# Patient Record
Sex: Male | Born: 1960
Health system: Southern US, Academic
[De-identification: ages and names within clinical notes are randomized; demographics above are authoritative.]

## PROBLEM LIST (undated history)

## (undated) ENCOUNTER — Encounter

## (undated) ENCOUNTER — Telehealth

## (undated) DIAGNOSIS — Z87442 Personal history of urinary calculi: Secondary | ICD-10-CM

## (undated) DIAGNOSIS — J8410 Pulmonary fibrosis, unspecified: Secondary | ICD-10-CM

## (undated) DIAGNOSIS — J189 Pneumonia, unspecified organism: Secondary | ICD-10-CM

## (undated) DIAGNOSIS — J449 Chronic obstructive pulmonary disease, unspecified: Secondary | ICD-10-CM

## (undated) DIAGNOSIS — R0600 Dyspnea, unspecified: Secondary | ICD-10-CM

## (undated) DIAGNOSIS — F419 Anxiety disorder, unspecified: Secondary | ICD-10-CM

## (undated) DIAGNOSIS — E119 Type 2 diabetes mellitus without complications: Secondary | ICD-10-CM

## (undated) DIAGNOSIS — J45909 Unspecified asthma, uncomplicated: Secondary | ICD-10-CM

## (undated) HISTORY — PX: LITHOTRIPSY: SUR834

## (undated) HISTORY — PX: MULTIPLE TOOTH EXTRACTIONS: SHX2053

## (undated) HISTORY — PX: TONSILLECTOMY: SUR1361

## (undated) HISTORY — PX: COLONOSCOPY W/ POLYPECTOMY: SHX1380

---

## 2011-01-16 ENCOUNTER — Ambulatory Visit (HOSPITAL_COMMUNITY)
Admission: RE | Admit: 2011-01-16 | Discharge: 2011-01-16 | Disposition: A | Discharge status: Home / Self Care | Payer: BC Managed Care – PPO | Source: Ambulatory Visit | Attending: Urology | Admitting: Urology

## 2011-01-16 DIAGNOSIS — F17200 Nicotine dependence, unspecified, uncomplicated: Secondary | ICD-10-CM | POA: Insufficient documentation

## 2011-01-16 DIAGNOSIS — J45909 Unspecified asthma, uncomplicated: Secondary | ICD-10-CM | POA: Insufficient documentation

## 2011-01-16 DIAGNOSIS — Z01818 Encounter for other preprocedural examination: Secondary | ICD-10-CM | POA: Insufficient documentation

## 2011-01-16 DIAGNOSIS — Z01812 Encounter for preprocedural laboratory examination: Secondary | ICD-10-CM | POA: Insufficient documentation

## 2011-01-16 DIAGNOSIS — Z01810 Encounter for preprocedural cardiovascular examination: Secondary | ICD-10-CM | POA: Insufficient documentation

## 2011-01-16 DIAGNOSIS — N200 Calculus of kidney: Secondary | ICD-10-CM | POA: Insufficient documentation

## 2011-01-16 LAB — CBC
MCV: 90.9 fL (ref 78.0–100.0)
Platelets: 215 10*3/uL (ref 150–400)
RBC: 5.18 MIL/uL (ref 4.22–5.81)
WBC: 7.7 10*3/uL (ref 4.0–10.5)

## 2011-01-16 LAB — BASIC METABOLIC PANEL
Chloride: 106 mEq/L (ref 96–112)
GFR calc Af Amer: >60 mL/min (ref >60)
GFR calc non Af Amer: >60 mL/min (ref >60)
Potassium: 4.3 mEq/L (ref 3.5–5.1)
Sodium: 138 mEq/L (ref 135–145)

## 2012-03-28 ENCOUNTER — Other Ambulatory Visit (HOSPITAL_COMMUNITY): Payer: Self-pay | Admitting: Internal Medicine

## 2012-04-03 ENCOUNTER — Encounter (HOSPITAL_COMMUNITY): Payer: BC Managed Care – PPO

## 2012-06-24 ENCOUNTER — Encounter (HOSPITAL_COMMUNITY): Payer: BC Managed Care – PPO

## 2012-06-25 ENCOUNTER — Ambulatory Visit (HOSPITAL_COMMUNITY)
Admission: RE | Admit: 2012-06-25 | Discharge: 2012-06-25 | Disposition: A | Discharge status: Home / Self Care | Payer: BC Managed Care – PPO | Source: Ambulatory Visit | Attending: Internal Medicine | Admitting: Internal Medicine

## 2012-06-25 DIAGNOSIS — J45909 Unspecified asthma, uncomplicated: Secondary | ICD-10-CM | POA: Insufficient documentation

## 2012-06-25 MED ORDER — ALBUTEROL SULFATE (5 MG/ML) 0.5% IN NEBU
2.5000 mg | INHALATION_SOLUTION | Freq: Once | RESPIRATORY_TRACT | Status: AC
  Administered 2012-06-25: 2.5 mg via RESPIRATORY_TRACT

## 2014-07-01 ENCOUNTER — Other Ambulatory Visit: Payer: Self-pay | Admitting: Internal Medicine

## 2014-07-01 ENCOUNTER — Ambulatory Visit
Admission: RE | Admit: 2014-07-01 | Discharge: 2014-07-01 | Disposition: A | Discharge status: Home / Self Care | Payer: BC Managed Care – PPO | Source: Ambulatory Visit | Attending: Internal Medicine | Admitting: Internal Medicine

## 2014-07-01 DIAGNOSIS — R05 Cough: Secondary | ICD-10-CM

## 2014-07-01 DIAGNOSIS — R059 Cough, unspecified: Secondary | ICD-10-CM

## 2014-07-01 DIAGNOSIS — R9389 Abnormal findings on diagnostic imaging of other specified body structures: Secondary | ICD-10-CM

## 2014-07-08 ENCOUNTER — Ambulatory Visit
Admission: RE | Admit: 2014-07-08 | Discharge: 2014-07-08 | Disposition: A | Discharge status: Home / Self Care | Payer: BC Managed Care – PPO | Source: Ambulatory Visit | Attending: Internal Medicine | Admitting: Internal Medicine

## 2014-07-08 DIAGNOSIS — R9389 Abnormal findings on diagnostic imaging of other specified body structures: Secondary | ICD-10-CM

## 2014-07-08 DIAGNOSIS — R05 Cough: Secondary | ICD-10-CM

## 2014-07-08 DIAGNOSIS — R059 Cough, unspecified: Secondary | ICD-10-CM

## 2015-03-04 ENCOUNTER — Other Ambulatory Visit: Payer: Self-pay | Admitting: Gastroenterology

## 2015-04-19 ENCOUNTER — Other Ambulatory Visit: Payer: Self-pay | Admitting: Internal Medicine

## 2015-04-19 ENCOUNTER — Ambulatory Visit
Admission: RE | Admit: 2015-04-19 | Discharge: 2015-04-19 | Disposition: A | Discharge status: Home / Self Care | Payer: BLUE CROSS/BLUE SHIELD | Source: Ambulatory Visit | Attending: Internal Medicine | Admitting: Internal Medicine

## 2015-04-19 DIAGNOSIS — R059 Cough, unspecified: Secondary | ICD-10-CM

## 2015-04-19 DIAGNOSIS — R05 Cough: Secondary | ICD-10-CM

## 2015-05-25 ENCOUNTER — Encounter (HOSPITAL_COMMUNITY): Payer: Self-pay | Admitting: *Deleted

## 2015-06-01 ENCOUNTER — Ambulatory Visit (HOSPITAL_COMMUNITY)
Admission: RE | Admit: 2015-06-01 | Payer: BLUE CROSS/BLUE SHIELD | Source: Ambulatory Visit | Admitting: Gastroenterology

## 2015-06-01 HISTORY — DX: Personal history of urinary calculi: Z87.442

## 2015-06-01 HISTORY — DX: Anxiety disorder, unspecified: F41.9

## 2015-06-01 HISTORY — DX: Unspecified asthma, uncomplicated: J45.909

## 2015-06-01 HISTORY — DX: Chronic obstructive pulmonary disease, unspecified: J44.9

## 2015-06-01 SURGERY — COLONOSCOPY WITH PROPOFOL
Anesthesia: Monitor Anesthesia Care

## 2015-07-08 ENCOUNTER — Ambulatory Visit
Admission: RE | Admit: 2015-07-08 | Discharge: 2015-07-08 | Disposition: A | Discharge status: Home / Self Care | Payer: BLUE CROSS/BLUE SHIELD | Source: Ambulatory Visit | Attending: Internal Medicine | Admitting: Internal Medicine

## 2015-07-08 ENCOUNTER — Other Ambulatory Visit: Payer: Self-pay | Admitting: Internal Medicine

## 2015-07-08 DIAGNOSIS — R9389 Abnormal findings on diagnostic imaging of other specified body structures: Secondary | ICD-10-CM

## 2015-07-09 ENCOUNTER — Other Ambulatory Visit: Payer: Self-pay | Admitting: Internal Medicine

## 2015-07-09 DIAGNOSIS — R9389 Abnormal findings on diagnostic imaging of other specified body structures: Secondary | ICD-10-CM

## 2015-07-14 ENCOUNTER — Ambulatory Visit
Admission: RE | Admit: 2015-07-14 | Discharge: 2015-07-14 | Disposition: A | Discharge status: Home / Self Care | Payer: BLUE CROSS/BLUE SHIELD | Source: Ambulatory Visit | Attending: Internal Medicine | Admitting: Internal Medicine

## 2015-07-14 DIAGNOSIS — R9389 Abnormal findings on diagnostic imaging of other specified body structures: Secondary | ICD-10-CM

## 2015-07-22 ENCOUNTER — Ambulatory Visit (INDEPENDENT_AMBULATORY_CARE_PROVIDER_SITE_OTHER): Payer: BLUE CROSS/BLUE SHIELD | Admitting: Internal Medicine

## 2015-07-22 ENCOUNTER — Encounter: Payer: Self-pay | Admitting: Internal Medicine

## 2015-07-22 VITALS — BP 120/86 | HR 100 | Ht 69.0 in | Wt 283.0 lb

## 2015-07-22 DIAGNOSIS — E669 Obesity, unspecified: Secondary | ICD-10-CM | POA: Diagnosis not present

## 2015-07-22 DIAGNOSIS — F17210 Nicotine dependence, cigarettes, uncomplicated: Secondary | ICD-10-CM

## 2015-07-22 DIAGNOSIS — J8410 Pulmonary fibrosis, unspecified: Secondary | ICD-10-CM | POA: Diagnosis not present

## 2015-07-22 DIAGNOSIS — Z720 Tobacco use: Secondary | ICD-10-CM | POA: Diagnosis not present

## 2015-07-22 MED ORDER — FAMOTIDINE 20 MG PO TABS: ORAL_TABLET | ORAL | Status: DC

## 2015-07-22 MED ORDER — PANTOPRAZOLE SODIUM 40 MG PO TBEC: 40.0000 mg | DELAYED_RELEASE_TABLET | Freq: Every day | ORAL | Status: DC

## 2015-07-22 NOTE — Patient Instructions (Addendum)
Stop smoking at all costs  Pantoprazole (protonix) 40 mg   Take  30-60 min before first meal of the day and Pepcid (famotidine)  20 mg one @  bedtime until return to office - this is the best way to tell whether stomach acid is contributing to your problem.    GERD (REFLUX)  is an extremely common cause of respiratory symptoms just like yours , many times with no obvious heartburn at all.    It can be treated with medication, but also with lifestyle changes including elevation of the head of your bed (ideally with 6 inch  bed blocks),  Smoking cessation, avoidance of late meals, excessive alcohol, and avoid fatty foods, chocolate, peppermint, colas, red wine, and acidic juices such as orange juice.  NO MINT OR MENTHOL PRODUCTS SO NO COUGH DROPS  USE SUGARLESS CANDY INSTEAD (Jolley ranchers or Stover's or Life Savers) or even ice chips will also do - the key is to swallow to prevent all throat clearing. NO OIL BASED VITAMINS - use powdered substitutes.    Please schedule a follow up office visit in 6 weeks, call sooner if needed with pfts

## 2015-07-22 NOTE — Progress Notes (Signed)
Subjective:    Patient ID: Bradley [name], male    DOB: 1961-02-07,    MRN: [id number]  HPI  67 yowm active smoker with h/o allergies as child just responeded to freq rx with chlortrimeton and improved until developed pattern of "bad  Colds" onset  in the fall x 2012 initially responded to abx and prednisone and rx with maint advair and rare prn saba with no chronic symptoms in between flares until 2014 with persistent doe/cough x 2014 despite maint advair. referred 07/22/2015  by Dr Donette Larry to pulmonary clinic.    07/22/2015 1st [facility] office visit/ Wert   Chief Complaint  Patient presents with  . Pulmonary Consult    Referred by Dr. Tyson Dense. Pt c/o cough x 3 months.  Cough is prod with clear to light yellow sputum and seems esp worse in the am and in the evening. He has noticed cough is triggered by smoking and with exertion.  He c/o DOE for approx 6 months- occurs when doing yard work but does ok on flat surface at normal pace.   the onset / pattern of his symptoms very difficult to pin down but the last 3 months have been the worst and most persistent cough sob since onset of this recurrent problem in 2012 and are assoc with overt HB  No obvious other patterns in day to day or daytime variabilty or assoc  cp or chest tightness, subjective wheeze or overt sinus   symptoms. No unusual exp hx or h/o childhood pna/ asthma or knowledge of premature birth.  Sleeping ok without nocturnal  or early am exacerbation  of respiratory  c/o's or need for noct saba. Also denies any obvious fluctuation of symptoms with weather or environmental changes or other aggravating or alleviating factors except as outlined above   Current Medications, Allergies, Complete Past Medical History, Past Surgical History, Family History, and Social History were reviewed in [employer] record.            Review of Systems  Constitutional: Negative for fever, chills, activity  change, appetite change and unexpected weight change.  HENT: Negative for congestion, dental problem, postnasal drip, rhinorrhea, sneezing, sore throat, trouble swallowing and voice change.   Eyes: Negative for visual disturbance.  Respiratory: Positive for cough and shortness of breath. Negative for choking.   Cardiovascular: Negative for chest pain and leg swelling.  Gastrointestinal: Negative for nausea, vomiting and abdominal pain.  Genitourinary: Negative for difficulty urinating.       Indigestion  Musculoskeletal: Negative for arthralgias.  Skin: Negative for rash.  Psychiatric/Behavioral: Negative for behavioral problems and confusion.       Objective:   Physical Exam amb wm nad   Wt Readings from Last 3 Encounters:  07/22/15 283 lb (128.368 kg)    Vital signs reviewed   HEENT: nl dentition, turbinates, and orophanx. Nl external ear canals without cough reflex   NECK :  without JVD/Nodes/TM/ nl carotid upstrokes bilaterally   LUNGS: no acc muscle use, clear to A and P bilaterally without cough on insp or exp maneuvers   CV:  RRR  no s3 or murmur or increase in P2, no edema   ABD:  soft and nontender with nl excursion in the supine position. No bruits or organomegaly, bowel sounds nl  MS:  warm without deformities, calf tenderness, cyanosis or clubbing  SKIN: warm and dry without lesions    NEURO:  alert, approp, no deficits  I personally reviewed images and agree with radiology impression as follows:  CT chest  07/14/15 1. Diffuse changes of centrilobular and paraseptal emphysema. 2. Prominent interstitial markings primarily anteriorly within the upper lobes consistent with pulmonary fibrosis. No definite active process.            Assessment & Plan:

## 2015-07-23 ENCOUNTER — Encounter: Payer: Self-pay | Admitting: Internal Medicine

## 2015-07-23 DIAGNOSIS — F17210 Nicotine dependence, cigarettes, uncomplicated: Secondary | ICD-10-CM | POA: Insufficient documentation

## 2015-07-23 DIAGNOSIS — E669 Obesity, unspecified: Secondary | ICD-10-CM | POA: Insufficient documentation

## 2015-07-23 NOTE — Assessment & Plan Note (Signed)

## 2015-07-23 NOTE — Assessment & Plan Note (Addendum)
-   first suggested on cxr 07/01/14 - See CT chest 07/14/15   DDx for pulmonary fibrosis  includes idiopathic pulmonary fibrosis, pulmonary fibrosis associated with rheumatologic diseases (which have a relatively benign course in most cases) , adverse effect from  drugs such as chemotherapy or amiodarone exposure, nonspecific interstitial pneumonia which is typically steroid responsive, and chronic hypersensitivity pneumonitis.   In active  smokers Langerhan's Cell  Histiocyctosis (eosinophilic granuomatosis),  DIP,  and Respiratory Bronchiolitis ILD also need to be considered,  And for that reason it is critical that he stop smoking now and return with f/u pfts and go from there.  Total time = 18m review case with pt/ discussion/ counseling/ giving and going over instructions (see avs)

## 2015-07-23 NOTE — Assessment & Plan Note (Addendum)
Body mass index is 41.77 kg/(m^2).    Contributing to gerd tendency/ doe/reviewed the need and the process to achieve and maintain neg calorie balance > defer f/u primary care including intermittently monitoring thyroid status

## 2015-09-06 ENCOUNTER — Encounter: Payer: Self-pay | Admitting: Internal Medicine

## 2015-09-06 ENCOUNTER — Ambulatory Visit (INDEPENDENT_AMBULATORY_CARE_PROVIDER_SITE_OTHER): Payer: BLUE CROSS/BLUE SHIELD | Admitting: Internal Medicine

## 2015-09-06 VITALS — BP 142/86 | HR 80 | Ht 67.0 in | Wt 209.0 lb

## 2015-09-06 DIAGNOSIS — E669 Obesity, unspecified: Secondary | ICD-10-CM

## 2015-09-06 DIAGNOSIS — J8410 Pulmonary fibrosis, unspecified: Secondary | ICD-10-CM

## 2015-09-06 DIAGNOSIS — Z720 Tobacco use: Secondary | ICD-10-CM

## 2015-09-06 DIAGNOSIS — F17210 Nicotine dependence, cigarettes, uncomplicated: Secondary | ICD-10-CM

## 2015-09-06 LAB — PULMONARY FUNCTION TEST
DL/VA % pred: 72 %
DL/VA: 3.22 ml/min/mmHg/L
DLCO UNC: 16.75 ml/min/mmHg
DLCO unc % pred: 59 %
FEF 25-75 Post: 1.98 L/sec
FEF 25-75 Pre: 1.68 L/sec
FEF2575-%Change-Post: 17 %
FEF2575-%PRED-POST: 66 %
FEF2575-%Pred-Pre: 56 %
FEV1-%CHANGE-POST: 4 %
FEV1-%PRED-PRE: 80 %
FEV1-%Pred-Post: 83 %
FEV1-POST: 2.85 L
FEV1-PRE: 2.73 L
FEV1FVC-%Change-Post: 2 %
FEV1FVC-%Pred-Pre: 90 %
FEV6-%Change-Post: 2 %
FEV6-%PRED-POST: 93 %
FEV6-%Pred-Pre: 90 %
FEV6-POST: 3.96 L
FEV6-PRE: 3.86 L
FEV6FVC-%CHANGE-POST: 1 %
FEV6FVC-%PRED-POST: 103 %
FEV6FVC-%PRED-PRE: 102 %
FVC-%Change-Post: 1 %
FVC-%PRED-POST: 89 %
FVC-%PRED-PRE: 88 %
FVC-POST: 3.98 L
FVC-Pre: 3.92 L
PRE FEV6/FVC RATIO: 98 %
Post FEV1/FVC ratio: 72 %
Post FEV6/FVC ratio: 99 %
Pre FEV1/FVC ratio: 70 %
RV % PRED: 104 %
RV: 2.06 L
TLC % PRED: 94 %
TLC: 5.99 L

## 2015-09-06 NOTE — Progress Notes (Signed)
PFT done today. 

## 2015-09-06 NOTE — Progress Notes (Signed)
Subjective:    Patient ID: Bradley [name], male    DOB: 07-Feb-1961,    MRN: [id number]    Brief patient profile:  54 yowm quit smoking around 1st of Oct 2016    with h/o allergies as child just responeded to freq rx with chlortrimeton and improved until developed pattern of "bad  Colds" onset  in the fall x 2012 initially responded to abx and prednisone and rx with maint advair and rare prn saba with no chronic symptoms in between flares until 2014 with persistent doe/cough x 2014 despite maint advair  referred 07/22/2015  by Dr Donette Larry to pulmonary clinic.    History of Present Illness  07/22/2015 1st [facility] office visit/ Selyna Klahn   Chief Complaint  Patient presents with  . Pulmonary Consult    Referred by Dr. Tyson Dense. Pt c/o cough x 3 months.  Cough is prod with clear to light yellow sputum and seems esp worse in the am and in the evening. He has noticed cough is triggered by smoking and with exertion.  He c/o DOE for approx 6 months- occurs when doing yard work but does ok on flat surface at normal pace.   the onset / pattern of his symptoms very difficult to pin down but the last 3 months have been the worst and most persistent cough sob since onset of this recurrent problem in 2012 and are assoc with overt HB rec Stop smoking at all costs Pantoprazole (protonix) 40 mg   Take  30-60 min before first meal of the day and Pepcid (famotidine)  20 mg one @  bedtime until return to office - this is the best way to tell whether stomach acid is contributing to your problem.   GERD diet    09/06/2015  f/u ov/Samit Sylve re: ? Pf/ stopped smoking     Chief Complaint  Patient presents with  . Follow-up    PFT done today.  Pt states that his cough and SOB are much improved. He has quit smoking.      Hunting is his is hobby / [place] is ok / no aerobics but Not limited by breathing from desired activities    No obvious day to day or daytime variability or assoc chronic cough or cp or  chest tightness, subjective wheeze or overt sinus or hb symptoms. No unusual exp hx or h/o childhood pna/ asthma or knowledge of premature birth.  Sleeping ok without nocturnal  or early am exacerbation  of respiratory  c/o's or need for noct saba. Also denies any obvious fluctuation of symptoms with weather or environmental changes or other aggravating or alleviating factors except as outlined above   Current Medications, Allergies, Complete Past Medical History, Past Surgical History, Family History, and Social History were reviewed in [employer] record.  ROS  The following are not active complaints unless bolded sore throat, dysphagia, dental problems, itching, sneezing,  nasal congestion or excess/ purulent secretions, ear ache,   fever, chills, sweats, unintended wt loss, classically pleuritic or exertional cp, hemoptysis,  orthopnea pnd or leg swelling, presyncope, palpitations, abdominal pain, anorexia, nausea, vomiting, diarrhea  or change in bowel or bladder habits, change in stools or urine, dysuria,hematuria,  rash, arthralgias, visual complaints, headache, numbness, weakness or ataxia or problems with walking or coordination,  change in mood/affect or memory.  Objective:  Physical Exam   amb wm nad wt  09/06/2015 = 209    Vital signs reviewed    HEENT: nl dentition, turbinates, and orophanx. Nl external ear canals without cough reflex   NECK :  without JVD/Nodes/TM/ nl carotid upstrokes bilaterally   LUNGS: no acc muscle use, clear to A and P bilaterally without cough on insp or exp maneuvers   CV:  RRR  no s3 or murmur or increase in P2, no edema   ABD:  soft and nontender with nl excursion in the supine position. No bruits or organomegaly, bowel sounds nl  MS:  warm without deformities, calf tenderness, cyanosis or clubbing  SKIN: warm and dry without lesions    NEURO:  alert, approp, no deficits    I  personally reviewed images and agree with radiology impression as follows:  CT chest  07/14/15 1. Diffuse changes of centrilobular and paraseptal emphysema. 2. Prominent interstitial markings primarily anteriorly within the upper lobes consistent with pulmonary fibrosis. No definite active process.            Assessment & Plan:

## 2015-09-06 NOTE — Patient Instructions (Signed)
Stay as active as you can and let us know if you are losing any ground with exercise tolerance  Ok to hold off the acid suppression for now but resume if worse breathing, coughing or obvious heartburn   Congratulations on not smoking -it's the most important aspect of your care!!!  Please schedule a follow up visit in 6 months but call sooner if needed with pfts and cxr

## 2015-09-07 ENCOUNTER — Encounter: Payer: Self-pay | Admitting: Internal Medicine

## 2015-09-07 NOTE — Assessment & Plan Note (Signed)
Resolved   I reviewed the Fletcher curve with the patient that basically indicates  if you quit smoking when your best day FEV1 is still well preserved (as is clearly  the case here)  it is highly unlikely you will progress to severe disease and informed the patient there was no medication on the market that has proven to alter the curve/ its downward trajectory  or the likelihood of progression of their disease.  Therefore stopping smoking and maintaining abstinence is the most important aspect of care, not choice of inhalers or for that matter, doctors.

## 2015-09-07 NOTE — Assessment & Plan Note (Signed)
Body mass index is 32.73- note last bmi was spurious but he is still above 30 and at risk of wt gain from stop smoking   No results found for: TSH   Contributing to gerd tendency/ doe/reviewed the need and the process to achieve and maintain neg calorie balance > defer f/u primary care including intermittently monitoring thyroid status

## 2015-09-07 NOTE — Assessment & Plan Note (Addendum)
-   first suggested on cxr 07/01/14 - See CT chest 07/14/15  - PFT's 09/06/2015 VC 3.93 (8%) s obst and DLCO 59 corrects to 72% and ERV 23%   He doesn't have typical uip findings radiographically or clinically and feeling much better since quit smoking; however, have not ruled out uip  Use of PPI is associated with improved survival time and with decreased radiologic fibrosis per King's study published in AJRCCM vol 184 p1390.  Dec 2011 and also may have other beneficial effects as per the latest review in [facility] vol 193 p1345 Jun 20016.  This may not always be cause and effect, but given how universally unimpressive and expensive  all the other  Drugs developed to day  have been for pf,   rec start very low threshold to restart  ppi and continue indefinitely  diet/ lifestyle modification and f/u q 6 m with serial walking sats and lung volumes for now to put more points on the curve / establish firm baseline before considering additional measures.   I had an extended discussion with the patient reviewing all relevant studies completed to date and  lasting 15 to 20 minutes of a 25 minute visit    Each maintenance medication was reviewed in detail including most importantly the difference between maintenance and prns and under what circumstances the prns are to be triggered using an action plan format that is not reflected in the computer generated alphabetically organized AVS.    Please see instructions for details which were reviewed in writing and the patient given a copy highlighting the part that I personally wrote and discussed at today's ov.

## 2016-03-06 ENCOUNTER — Ambulatory Visit: Payer: BLUE CROSS/BLUE SHIELD | Admitting: Adult Health

## 2016-06-14 ENCOUNTER — Other Ambulatory Visit: Payer: Self-pay | Admitting: Orthopaedic Surgery

## 2016-06-14 DIAGNOSIS — Z139 Encounter for screening, unspecified: Secondary | ICD-10-CM

## 2016-06-14 DIAGNOSIS — M25511 Pain in right shoulder: Secondary | ICD-10-CM

## 2016-06-15 ENCOUNTER — Ambulatory Visit: Attending: Internal Medicine | Primary: Internal Medicine

## 2016-06-15 DIAGNOSIS — F330 Major depressive disorder, recurrent, mild: Secondary | ICD-10-CM

## 2016-06-15 DIAGNOSIS — F411 Generalized anxiety disorder: Principal | ICD-10-CM

## 2016-06-15 DIAGNOSIS — Z6824 Body mass index (BMI) 24.0-24.9, adult: Secondary | ICD-10-CM

## 2016-06-15 MED ORDER — ALPRAZOLAM 0.5 MG PO TABS
.5 mg | Freq: Every day | ORAL | 2 refills | Status: CP | PRN
Start: 2016-06-15 — End: 2017-01-01

## 2016-06-15 NOTE — Progress Notes
Subjective:   Gary Landry is a 55 y.o. male being seen today for Depression (discuss medication)       HPI     Reviewed labs , discussed mildly elevated BS 105    Currently On trintellix; very satisfied  See therapist prn    Occ anxiety attacks, sporadic occuring related life events  Using Xanax HS, and occ during day  Office Visit on 01/25/2016   Component Date Value   ? WHITE BLOOD CELL COUNT 02/25/2016 3.7*   ? RBC 02/25/2016 4.65    ? Hemoglobin 02/25/2016 14.9    ? Hematocrit 02/25/2016 42.5    ? MCV 02/25/2016 91.4    ? [facility] 02/25/2016 32.0    ? MCHC 02/25/2016 35.1    ? RDW 02/25/2016 13.6    ? Platelets 02/25/2016 199    ? MPV 02/25/2016 10.5    ? Neutrophils Absolute 02/25/2016 2523    ? Lymphocytes Absolute 02/25/2016 777*   ? Monocytes Absolute 02/25/2016 270    ? Eosinophils Absolute 02/25/2016 111    ? Basophils Absolute 02/25/2016 19    ? Neutrophils 02/25/2016 68.2    ? LYMPHOCYTES 02/25/2016 21.0    ? MONOCYTES 02/25/2016 7.3    ? EOSINOPHILS 02/25/2016 3.0    ? BASOPHILS 02/25/2016 0.5    ? Glucose 02/25/2016 105*   ? Urea Nitrogen 02/25/2016 17    ? Creatinine 02/25/2016 1.17    ? EGFR 02/25/2016 70    ? Glom Filt Rate, Est Afri* 02/25/2016 81    ? BUN/Creatinine Ratio 86/76/1950 NOT APPLICABLE    ? Sodium 02/25/2016 140    ? Potassium 02/25/2016 4.6    ? Chloride 02/25/2016 105    ? CARBON DIOXIDE 02/25/2016 28    ? Calcium 02/25/2016 10.6*   ? Protein, Total 02/25/2016 6.5    ? ALBUMIN 02/25/2016 4.4    ? Globulin 02/25/2016 2.1    ? ALBUMIN/GLOBULIN RATIO 02/25/2016 2.1    ? Total Bilirubin 02/25/2016 0.8    ? Alkaline Phosphatase 02/25/2016 46    ? AST 02/25/2016 14    ? ALT 02/25/2016 16    ? TSH, 3RD GENERATION 02/25/2016 1.02    ? Free T4 02/25/2016 1.1      Past Medical History:   Diagnosis Date   ? Anxiety    ? Depression      Past Surgical History:   Procedure Laterality Date   ? KNEE SURGERY      Surgery Description: Knee Surgery;   (Created by Conversion)     Family History

## 2016-06-15 NOTE — Progress Notes
-  FB at 06/15/16 1116       Pulse Source Radial    -FB at 06/15/16 1116       Pulse Quality Normal    -FB at 06/15/16 1116       Resp 16    -FB at 06/15/16 1116       Respiration Quality Normal    -FB at 06/15/16 1116       Temp 36.9 ?C (98.4 ?F)    -FB at 06/15/16 1116       Temperature Source Oral    -FB at 06/15/16 1116       Pain Score Zero    -FB at 06/15/16 1116       Education/Communication Barriers?    Learning/Communication Barriers? No    -FB at 06/15/16 1116       Fall Risk Assessment    Had recent fall / Last 6 months? No recent fall    -FB at 06/15/16 1116       Does patient have a fear of falling? No    -FB at 06/15/16 1116         User Key  (r) = Recorded By, (t) = Taken By, (c) = Cosigned By    Initials Name Effective Dates    FB Gary Landry, [place], MA 02/01/16 -         Physical Exam   Constitutional: He appears well-nourished. No distress.   Cardiovascular: Normal rate.    Pulmonary/Chest: Effort normal and breath sounds normal.   Abdominal: Soft. Bowel sounds are normal.   Skin: Skin is warm and dry.   Psychiatric: His affect is not blunt, not labile and not inappropriate. His speech is not delayed and not tangential. He is not slowed. Cognition and memory are not impaired. He expresses impulsivity. He does not express inappropriate judgment. He exhibits normal recent memory and normal remote memory.   Nursing note and vitals reviewed.      Assessment:       ICD-10-CM ICD-9-CM    1. Anxiety state F41.1 300.00 ALPRAZolam (XANAX) 0.5 MG Tablet   2. Body mass index (BMI) of 24.0-24.9 in adult Z68.24 V85.1    3. 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 ALPRAZolam (XANAX) 0.5 MG Tablet     Sig: Take 1 tablet by mouth daily as needed for sleep or anxiety.     Dispense:  90 tablet     Refill:  2     No orders of the following type(s) were placed in this encounter: Procedures

## 2016-06-15 NOTE — Progress Notes
Problem Relation Age of Onset   ? High Blood Pressure Other      Social History     Social History   ? Marital status: Married     Spouse name: N/A   ? Number of children: N/A   ? Years of education: N/A     Occupational History   ? Not on file.     Social History Main Topics   ? Smoking status: Unknown If Ever Smoked   ? Smokeless tobacco: Not on file   ? Alcohol use Yes   ? Drug use: No   ? Sexual activity: Not on file     Other Topics Concern   ? Not on file     Social History Narrative     Current Outpatient Prescriptions on File Prior to Visit   Medication Sig   ? [DISCONTINUED] ALPRAZolam (XANAX) 0.5 MG Tablet Take 1 tablet by mouth 3 times daily as needed for sleep or anxiety.   ? [DISCONTINUED] azithromycin (ZITHROMAX) 250 MG Tablet Take 2 tablets (500 mg) on  Day 1,  followed by 1 tablet (250 mg) once daily on Days 2 through 5.   ? vortioxetine (TRINTELLIX) 20 MG Tablet Take 1 tablet by mouth daily.   ? zolpidem (AMBIEN) 10 MG Tablet Take 1 tablet by mouth nightly at bedtime as needed for sleep.     No current facility-administered medications on file prior to visit.      Allergies   Allergen Reactions   ? Penicillins      Penicillins   ? Sulfa Drugs      Sulfa Drugs         Review of Systems  Review of Systems   Cardiovascular: Negative.    Gastrointestinal: Negative.    Psychiatric/Behavioral: Positive for decreased concentration, dysphoric mood and sleep disturbance. The patient is nervous/anxious.          Objective:        VITAL SIGNS (all recorded)      Clinic Vitals       06/15/16 1116             Amb Encounter Vitals    Weight 83 kg (183 lb)    -FB at 06/15/16 1116       Height 1.829 m (6')    -FB at 06/15/16 1116       BMI (Calculated) 24.87    -FB at 06/15/16 1116       BSA (Calculated - sq m) 2.05    -FB at 06/15/16 1116       BP 120/80    -FB at 06/15/16 1116       BP Location Right upper arm    -FB at 06/15/16 1116       Position Sitting    -FB at 06/15/16 1116       Pulse 52

## 2016-06-15 NOTE — Progress Notes
Health Maintenance was reviewed. The patient's HM Topic list was:                                            Health Maintenance   Topic Date Due   ? Preventive Wellness Visit  09/01/2016 (Originally 05/13/2016)   ? Influenza Vaccine (1) 06/30/2016   ? DTaP,Tdap,and Td Vaccines (2 - Td) 12/07/2019   ? Lipid Profile  08/19/2020   ? Colonoscopy  04/11/2023   ? USPSTF HIV Risk Assessment  Addressed   ? USPSTF Hepatitis C Screening  Addressed

## 2016-07-04 ENCOUNTER — Ambulatory Visit
Admission: RE | Admit: 2016-07-04 | Discharge: 2016-07-04 | Disposition: A | Discharge status: Home / Self Care | Payer: BLUE CROSS/BLUE SHIELD | Source: Ambulatory Visit | Attending: Orthopaedic Surgery | Admitting: Orthopaedic Surgery

## 2016-07-04 DIAGNOSIS — Z139 Encounter for screening, unspecified: Secondary | ICD-10-CM

## 2016-07-04 DIAGNOSIS — M25511 Pain in right shoulder: Secondary | ICD-10-CM

## 2016-08-11 ENCOUNTER — Encounter (HOSPITAL_COMMUNITY): Payer: Self-pay | Admitting: *Deleted

## 2016-08-11 ENCOUNTER — Other Ambulatory Visit: Payer: Self-pay | Admitting: Orthopaedic Surgery

## 2016-08-14 ENCOUNTER — Ambulatory Visit (HOSPITAL_COMMUNITY): Payer: BLUE CROSS/BLUE SHIELD | Admitting: Anesthesiology

## 2016-08-14 ENCOUNTER — Ambulatory Visit (HOSPITAL_COMMUNITY)
Admission: RE | Admit: 2016-08-14 | Discharge: 2016-08-15 | Disposition: A | Discharge status: Home / Self Care | Payer: BLUE CROSS/BLUE SHIELD | Source: Ambulatory Visit | Attending: Orthopaedic Surgery | Admitting: Orthopaedic Surgery

## 2016-08-14 ENCOUNTER — Ambulatory Visit (HOSPITAL_COMMUNITY): Payer: BLUE CROSS/BLUE SHIELD

## 2016-08-14 ENCOUNTER — Encounter (HOSPITAL_COMMUNITY): Payer: Self-pay | Admitting: *Deleted

## 2016-08-14 ENCOUNTER — Encounter (HOSPITAL_COMMUNITY)
Admission: RE | Disposition: A | Discharge status: Home / Self Care | Payer: Self-pay | Source: Ambulatory Visit | Attending: Orthopaedic Surgery

## 2016-08-14 DIAGNOSIS — Z888 Allergy status to other drugs, medicaments and biological substances status: Secondary | ICD-10-CM | POA: Insufficient documentation

## 2016-08-14 DIAGNOSIS — M62511 Muscle wasting and atrophy, not elsewhere classified, right shoulder: Secondary | ICD-10-CM | POA: Insufficient documentation

## 2016-08-14 DIAGNOSIS — M94211 Chondromalacia, right shoulder: Secondary | ICD-10-CM | POA: Diagnosis not present

## 2016-08-14 DIAGNOSIS — F419 Anxiety disorder, unspecified: Secondary | ICD-10-CM | POA: Diagnosis not present

## 2016-08-14 DIAGNOSIS — J449 Chronic obstructive pulmonary disease, unspecified: Secondary | ICD-10-CM | POA: Insufficient documentation

## 2016-08-14 DIAGNOSIS — F17210 Nicotine dependence, cigarettes, uncomplicated: Secondary | ICD-10-CM | POA: Insufficient documentation

## 2016-08-14 DIAGNOSIS — Z800 Family history of malignant neoplasm of digestive organs: Secondary | ICD-10-CM | POA: Diagnosis not present

## 2016-08-14 DIAGNOSIS — Z8249 Family history of ischemic heart disease and other diseases of the circulatory system: Secondary | ICD-10-CM | POA: Insufficient documentation

## 2016-08-14 DIAGNOSIS — M75121 Complete rotator cuff tear or rupture of right shoulder, not specified as traumatic: Secondary | ICD-10-CM

## 2016-08-14 DIAGNOSIS — F410 Panic disorder [episodic paroxysmal anxiety] without agoraphobia: Secondary | ICD-10-CM | POA: Insufficient documentation

## 2016-08-14 DIAGNOSIS — M75101 Unspecified rotator cuff tear or rupture of right shoulder, not specified as traumatic: Secondary | ICD-10-CM | POA: Diagnosis present

## 2016-08-14 DIAGNOSIS — M67813 Other specified disorders of tendon, right shoulder: Secondary | ICD-10-CM | POA: Insufficient documentation

## 2016-08-14 DIAGNOSIS — Z01811 Encounter for preprocedural respiratory examination: Secondary | ICD-10-CM

## 2016-08-14 DIAGNOSIS — Z87442 Personal history of urinary calculi: Secondary | ICD-10-CM | POA: Insufficient documentation

## 2016-08-14 HISTORY — PX: SHOULDER ARTHROSCOPY WITH ROTATOR CUFF REPAIR: SHX5685

## 2016-08-14 HISTORY — DX: Dyspnea, unspecified: R06.00

## 2016-08-14 HISTORY — DX: Pneumonia, unspecified organism: J18.9

## 2016-08-14 LAB — CBC
HEMATOCRIT: 46.3 % (ref 39.0–52.0)
HEMOGLOBIN: 15.8 g/dL (ref 13.0–17.0)
MCH: 30.9 pg (ref 26.0–34.0)
MCHC: 34.1 g/dL (ref 30.0–36.0)
MCV: 90.6 fL (ref 78.0–100.0)
Platelets: 216 10*3/uL (ref 150–400)
RBC: 5.11 MIL/uL (ref 4.22–5.81)
RDW: 13.1 % (ref 11.5–15.5)
WBC: 9.5 10*3/uL (ref 4.0–10.5)

## 2016-08-14 LAB — COMPREHENSIVE METABOLIC PANEL
ALBUMIN: 3.8 g/dL (ref 3.5–5.0)
ALK PHOS: 50 U/L (ref 38–126)
ALT: 15 U/L — AB (ref 17–63)
ANION GAP: 9 (ref 5–15)
AST: 21 U/L (ref 15–41)
BILIRUBIN TOTAL: 0.6 mg/dL (ref 0.3–1.2)
BUN: 6 mg/dL (ref 6–20)
CALCIUM: 9.5 mg/dL (ref 8.9–10.3)
CO2: 25 mmol/L (ref 22–32)
CREATININE: 0.88 mg/dL (ref 0.61–1.24)
Chloride: 103 mmol/L (ref 101–111)
GFR calc Af Amer: >60 mL/min (ref >60)
GFR calc non Af Amer: >60 mL/min (ref >60)
GLUCOSE: 92 mg/dL (ref 65–99)
Potassium: 4.1 mmol/L (ref 3.5–5.1)
SODIUM: 137 mmol/L (ref 135–145)
TOTAL PROTEIN: 8.1 g/dL (ref 6.5–8.1)

## 2016-08-14 LAB — PROTIME-INR
INR: 1.06
Prothrombin Time: 13.8 seconds (ref 11.4–15.2)

## 2016-08-14 SURGERY — ARTHROSCOPY, SHOULDER, WITH ROTATOR CUFF REPAIR
Anesthesia: General | Site: Shoulder | Laterality: Right

## 2016-08-14 MED ORDER — ONDANSETRON HCL 4 MG/2ML IJ SOLN
4.0000 mg | Freq: Four times a day (QID) | INTRAMUSCULAR | Status: DC | PRN
Start: 2016-08-14 — End: 2016-08-15
  Administered 2016-08-14: 4 mg via INTRAVENOUS
  Filled 2016-08-14: qty 2

## 2016-08-14 MED ORDER — TRAMADOL HCL 50 MG PO TABS
50.0000 mg | ORAL_TABLET | Freq: Three times a day (TID) | ORAL | Status: DC
  Administered 2016-08-14 – 2016-08-15 (×2): 50 mg via ORAL
  Filled 2016-08-14 (×2): qty 1

## 2016-08-14 MED ORDER — ACETAMINOPHEN 325 MG PO TABS: 650.0000 mg | ORAL_TABLET | Freq: Four times a day (QID) | ORAL | Status: DC | PRN

## 2016-08-14 MED ORDER — CITALOPRAM HYDROBROMIDE 20 MG PO TABS
30.0000 mg | ORAL_TABLET | Freq: Every morning | ORAL | Status: DC
  Administered 2016-08-15: 30 mg via ORAL
  Filled 2016-08-14: qty 1

## 2016-08-14 MED ORDER — MIDAZOLAM HCL 2 MG/2ML IJ SOLN
INTRAMUSCULAR | Status: AC
  Administered 2016-08-14: 2 mg via INTRAVENOUS
  Filled 2016-08-14: qty 2

## 2016-08-14 MED ORDER — LACTATED RINGERS IV SOLN
INTRAVENOUS | Status: DC
  Administered 2016-08-14 (×2): via INTRAVENOUS

## 2016-08-14 MED ORDER — PROMETHAZINE HCL 25 MG/ML IJ SOLN
INTRAMUSCULAR | Status: AC
  Administered 2016-08-14: 6.25 mg via INTRAVENOUS
  Filled 2016-08-14: qty 1

## 2016-08-14 MED ORDER — 0.9 % SODIUM CHLORIDE (POUR BTL) OPTIME
TOPICAL | Status: DC | PRN
  Administered 2016-08-14: 1000 mL

## 2016-08-14 MED ORDER — ONDANSETRON HCL 4 MG/2ML IJ SOLN
INTRAMUSCULAR | Status: DC | PRN
  Administered 2016-08-14: 4 mg via INTRAVENOUS

## 2016-08-14 MED ORDER — MIDAZOLAM HCL 2 MG/2ML IJ SOLN
2.0000 mg | Freq: Once | INTRAMUSCULAR | Status: AC
  Administered 2016-08-14: 2 mg via INTRAVENOUS

## 2016-08-14 MED ORDER — LORATADINE 10 MG PO TABS: 10.0000 mg | ORAL_TABLET | Freq: Every day | ORAL | Status: DC

## 2016-08-14 MED ORDER — CEFAZOLIN SODIUM-DEXTROSE 2-4 GM/100ML-% IV SOLN
INTRAVENOUS | Status: AC
  Filled 2016-08-14: qty 100

## 2016-08-14 MED ORDER — FENTANYL CITRATE (PF) 100 MCG/2ML IJ SOLN
INTRAMUSCULAR | Status: AC
  Filled 2016-08-14: qty 4

## 2016-08-14 MED ORDER — MIDAZOLAM HCL 2 MG/2ML IJ SOLN
INTRAMUSCULAR | Status: AC
  Filled 2016-08-14: qty 2

## 2016-08-14 MED ORDER — BUPIVACAINE-EPINEPHRINE (PF) 0.5% -1:200000 IJ SOLN
INTRAMUSCULAR | Status: DC | PRN
  Administered 2016-08-14: 30 mL via PERINEURAL

## 2016-08-14 MED ORDER — CEFAZOLIN SODIUM-DEXTROSE 2-4 GM/100ML-% IV SOLN
2.0000 g | INTRAVENOUS | Status: AC
  Administered 2016-08-14: 2 g via INTRAVENOUS

## 2016-08-14 MED ORDER — FENTANYL CITRATE (PF) 100 MCG/2ML IJ SOLN
100.0000 ug | Freq: Once | INTRAMUSCULAR | Status: AC
  Administered 2016-08-14: 100 ug via INTRAVENOUS

## 2016-08-14 MED ORDER — IBUPROFEN 200 MG PO TABS
600.0000 mg | ORAL_TABLET | Freq: Four times a day (QID) | ORAL | Status: DC | PRN
  Administered 2016-08-15: 600 mg via ORAL
  Filled 2016-08-14: qty 3

## 2016-08-14 MED ORDER — HYDROMORPHONE HCL 1 MG/ML IJ SOLN: 0.5000 mg | INTRAMUSCULAR | Status: DC | PRN

## 2016-08-14 MED ORDER — LIDOCAINE HCL (CARDIAC) 20 MG/ML IV SOLN
INTRAVENOUS | Status: DC | PRN
  Administered 2016-08-14: 100 mg via INTRAVENOUS

## 2016-08-14 MED ORDER — CEFAZOLIN SODIUM 1 G IJ SOLR
INTRAMUSCULAR | Status: AC
  Filled 2016-08-14: qty 20

## 2016-08-14 MED ORDER — SUGAMMADEX SODIUM 200 MG/2ML IV SOLN
INTRAVENOUS | Status: AC
  Filled 2016-08-14: qty 4

## 2016-08-14 MED ORDER — METHOCARBAMOL 500 MG PO TABS
500.0000 mg | ORAL_TABLET | Freq: Four times a day (QID) | ORAL | Status: DC | PRN
  Administered 2016-08-15: 500 mg via ORAL
  Filled 2016-08-14: qty 1

## 2016-08-14 MED ORDER — BUPIVACAINE-EPINEPHRINE (PF) 0.25% -1:200000 IJ SOLN
INTRAMUSCULAR | Status: AC
  Filled 2016-08-14: qty 30

## 2016-08-14 MED ORDER — PROPOFOL 10 MG/ML IV BOLUS
INTRAVENOUS | Status: DC | PRN
  Administered 2016-08-14: 200 mg via INTRAVENOUS

## 2016-08-14 MED ORDER — HYDROMORPHONE HCL 1 MG/ML IJ SOLN: 0.2500 mg | INTRAMUSCULAR | Status: DC | PRN

## 2016-08-14 MED ORDER — ACETAMINOPHEN 650 MG RE SUPP: 650.0000 mg | Freq: Four times a day (QID) | RECTAL | Status: DC | PRN

## 2016-08-14 MED ORDER — KETOROLAC TROMETHAMINE 15 MG/ML IJ SOLN
15.0000 mg | Freq: Four times a day (QID) | INTRAMUSCULAR | Status: DC
  Administered 2016-08-14 – 2016-08-15 (×2): 15 mg via INTRAVENOUS
  Filled 2016-08-14 (×2): qty 1

## 2016-08-14 MED ORDER — SODIUM CHLORIDE 0.45 % IV SOLN: INTRAVENOUS | Status: DC

## 2016-08-14 MED ORDER — PROPOFOL 10 MG/ML IV BOLUS
INTRAVENOUS | Status: AC
  Filled 2016-08-14: qty 20

## 2016-08-14 MED ORDER — EPINEPHRINE PF 1 MG/ML IJ SOLN
INTRAMUSCULAR | Status: AC
  Filled 2016-08-14: qty 1

## 2016-08-14 MED ORDER — CHLORHEXIDINE GLUCONATE 4 % EX LIQD: 60.0000 mL | Freq: Once | CUTANEOUS | Status: DC

## 2016-08-14 MED ORDER — DEXTROSE 5 % IV SOLN
500.0000 mg | Freq: Four times a day (QID) | INTRAVENOUS | Status: DC | PRN
  Filled 2016-08-14: qty 5

## 2016-08-14 MED ORDER — ROCURONIUM BROMIDE 100 MG/10ML IV SOLN
INTRAVENOUS | Status: DC | PRN
  Administered 2016-08-14: 50 mg via INTRAVENOUS

## 2016-08-14 MED ORDER — LIDOCAINE 2% (20 MG/ML) 5 ML SYRINGE
INTRAMUSCULAR | Status: AC
  Filled 2016-08-14: qty 15

## 2016-08-14 MED ORDER — METOCLOPRAMIDE HCL 5 MG PO TABS: 5.0000 mg | ORAL_TABLET | Freq: Three times a day (TID) | ORAL | Status: DC | PRN

## 2016-08-14 MED ORDER — FENTANYL CITRATE (PF) 100 MCG/2ML IJ SOLN
INTRAMUSCULAR | Status: AC
  Administered 2016-08-14: 100 ug via INTRAVENOUS
  Filled 2016-08-14: qty 2

## 2016-08-14 MED ORDER — ALBUTEROL SULFATE (2.5 MG/3ML) 0.083% IN NEBU
3.0000 mL | INHALATION_SOLUTION | Freq: Four times a day (QID) | RESPIRATORY_TRACT | Status: DC | PRN
  Administered 2016-08-15: 3 mL via RESPIRATORY_TRACT
  Filled 2016-08-14: qty 3

## 2016-08-14 MED ORDER — ALBUTEROL SULFATE (2.5 MG/3ML) 0.083% IN NEBU
2.5000 mg | INHALATION_SOLUTION | Freq: Once | RESPIRATORY_TRACT | Status: AC
  Administered 2016-08-14: 2.5 mg via RESPIRATORY_TRACT

## 2016-08-14 MED ORDER — SUGAMMADEX SODIUM 200 MG/2ML IV SOLN
INTRAVENOUS | Status: DC | PRN
  Administered 2016-08-14: 200 mg via INTRAVENOUS

## 2016-08-14 MED ORDER — ONDANSETRON HCL 4 MG/2ML IJ SOLN
INTRAMUSCULAR | Status: AC
  Filled 2016-08-14: qty 4

## 2016-08-14 MED ORDER — HYDROCODONE-ACETAMINOPHEN 7.5-325 MG PO TABS
1.0000 | ORAL_TABLET | Freq: Four times a day (QID) | ORAL | Status: DC
  Administered 2016-08-14 – 2016-08-15 (×2): 1 via ORAL
  Filled 2016-08-14 (×2): qty 1

## 2016-08-14 MED ORDER — SODIUM CHLORIDE 0.9 % IR SOLN
Status: DC | PRN
  Administered 2016-08-14: 3000 mL

## 2016-08-14 MED ORDER — ONDANSETRON HCL 4 MG PO TABS
4.0000 mg | ORAL_TABLET | Freq: Four times a day (QID) | ORAL | Status: DC | PRN
  Administered 2016-08-15: 4 mg via ORAL
  Filled 2016-08-14: qty 1

## 2016-08-14 MED ORDER — FENTANYL CITRATE (PF) 100 MCG/2ML IJ SOLN
INTRAMUSCULAR | Status: DC | PRN
  Administered 2016-08-14 (×2): 75 ug via INTRAVENOUS
  Administered 2016-08-14: 50 ug via INTRAVENOUS

## 2016-08-14 MED ORDER — ALBUTEROL SULFATE (2.5 MG/3ML) 0.083% IN NEBU
INHALATION_SOLUTION | RESPIRATORY_TRACT | Status: AC
  Administered 2016-08-14: 2.5 mg via RESPIRATORY_TRACT
  Filled 2016-08-14: qty 3

## 2016-08-14 MED ORDER — BUPIVACAINE-EPINEPHRINE 0.25% -1:200000 IJ SOLN
INTRAMUSCULAR | Status: DC | PRN
  Administered 2016-08-14: 9 mL

## 2016-08-14 MED ORDER — PHENYLEPHRINE 40 MCG/ML (10ML) SYRINGE FOR IV PUSH (FOR BLOOD PRESSURE SUPPORT)
PREFILLED_SYRINGE | INTRAVENOUS | Status: AC
  Filled 2016-08-14: qty 10

## 2016-08-14 MED ORDER — STERILE WATER FOR IRRIGATION IR SOLN
Status: DC | PRN
  Administered 2016-08-14: 1000 mL

## 2016-08-14 MED ORDER — METOCLOPRAMIDE HCL 5 MG/ML IJ SOLN: 5.0000 mg | Freq: Three times a day (TID) | INTRAMUSCULAR | Status: DC | PRN

## 2016-08-14 MED ORDER — PHENYLEPHRINE HCL 10 MG/ML IJ SOLN
INTRAVENOUS | Status: DC | PRN
  Administered 2016-08-14: 25 ug/min via INTRAVENOUS

## 2016-08-14 MED ORDER — MOMETASONE FURO-FORMOTEROL FUM 200-5 MCG/ACT IN AERO
2.0000 | INHALATION_SPRAY | Freq: Two times a day (BID) | RESPIRATORY_TRACT | Status: DC
Start: 2016-08-14 — End: 2016-08-15
  Filled 2016-08-14: qty 8.8

## 2016-08-14 MED ORDER — PROMETHAZINE HCL 25 MG/ML IJ SOLN
6.2500 mg | INTRAMUSCULAR | Status: DC | PRN
  Administered 2016-08-14: 6.25 mg via INTRAVENOUS

## 2016-08-14 SURGICAL SUPPLY — 51 items
BENZOIN TINCTURE PRP APPL 2/3 (GAUZE/BANDAGES/DRESSINGS) ×2 IMPLANT
BLADE CUTTER GATOR 3.5 (BLADE) IMPLANT
BLADE GREAT WHITE 4.2 (BLADE) ×2 IMPLANT
BLADE SURG 11 STRL SS (BLADE) ×2 IMPLANT
CANNULA ACUFLEX KIT 5X76 (CANNULA) ×4 IMPLANT
CLSR STERI-STRIP ANTIMIC 1/2X4 (GAUZE/BANDAGES/DRESSINGS) ×2 IMPLANT
COVER SURGICAL LIGHT HANDLE (MISCELLANEOUS) ×2 IMPLANT
DRAPE STERI 35X30 U-POUCH (DRAPES) ×2 IMPLANT
DRAPE U-SHAPE 47X51 STRL (DRAPES) ×2 IMPLANT
DRSG PAD ABDOMINAL 8X10 ST (GAUZE/BANDAGES/DRESSINGS) ×2 IMPLANT
DURAPREP 26ML APPLICATOR (WOUND CARE) ×2 IMPLANT
ELECT REM PT RETURN 9FT ADLT (ELECTROSURGICAL) ×2
ELECTRODE REM PT RTRN 9FT ADLT (ELECTROSURGICAL) ×1 IMPLANT
GAUZE SPONGE 4X4 12PLY STRL (GAUZE/BANDAGES/DRESSINGS) ×2 IMPLANT
GAUZE XEROFORM 1X8 LF (GAUZE/BANDAGES/DRESSINGS) ×2 IMPLANT
GLOVE BIOGEL PI IND STRL 6.5 (GLOVE) ×1 IMPLANT
GLOVE BIOGEL PI IND STRL 8 (GLOVE) ×1 IMPLANT
GLOVE BIOGEL PI INDICATOR 6.5 (GLOVE) ×1
GLOVE BIOGEL PI INDICATOR 8 (GLOVE) ×1
GLOVE ORTHO TXT STRL SZ7.5 (GLOVE) ×2 IMPLANT
GOWN STRL REUS W/ TWL LRG LVL3 (GOWN DISPOSABLE) ×1 IMPLANT
GOWN STRL REUS W/ TWL XL LVL3 (GOWN DISPOSABLE) ×1 IMPLANT
GOWN STRL REUS W/TWL LRG LVL3 (GOWN DISPOSABLE) ×1
GOWN STRL REUS W/TWL XL LVL3 (GOWN DISPOSABLE) ×1
KIT BASIN OR (CUSTOM PROCEDURE TRAY) ×2 IMPLANT
KIT ROOM TURNOVER OR (KITS) ×2 IMPLANT
MANIFOLD NEPTUNE II (INSTRUMENTS) ×2 IMPLANT
NDL SUT 6 .5 CRC .975X.05 MAYO (NEEDLE) IMPLANT
NEEDLE HYPO 25X1 1.5 SAFETY (NEEDLE) ×2 IMPLANT
NEEDLE MAYO TAPER (NEEDLE)
NEEDLE SPNL 18GX3.5 QUINCKE PK (NEEDLE) ×2 IMPLANT
NS IRRIG 1000ML POUR BTL (IV SOLUTION) ×2 IMPLANT
PACK SHOULDER (CUSTOM PROCEDURE TRAY) ×2 IMPLANT
PAD ARMBOARD 7.5X6 YLW CONV (MISCELLANEOUS) ×4 IMPLANT
SET ARTHROSCOPY TUBING (MISCELLANEOUS) ×1
SET ARTHROSCOPY TUBING LN (MISCELLANEOUS) ×1 IMPLANT
SLING ARM IMMOBILIZER LRG (SOFTGOODS) ×2 IMPLANT
SPONGE GAUZE 4X4 12PLY STER LF (GAUZE/BANDAGES/DRESSINGS) ×2 IMPLANT
SPONGE LAP 4X18 X RAY DECT (DISPOSABLE) ×4 IMPLANT
STRIP CLOSURE SKIN 1/2X4 (GAUZE/BANDAGES/DRESSINGS) ×2 IMPLANT
SUT ANCHOR ULTRAFIX RC (Orthopedic Implant) ×6 IMPLANT
SUT ETHILON 4 0 PS 2 18 (SUTURE) ×2 IMPLANT
SUT VIC AB 0 CT2 27 (SUTURE) ×2 IMPLANT
SUT VIC AB 2-0 CT1 27 (SUTURE) ×1
SUT VIC AB 2-0 CT1 TAPERPNT 27 (SUTURE) ×1 IMPLANT
SUT VICRYL 4-0 PS2 18IN ABS (SUTURE) ×2 IMPLANT
TAPE CLOTH SURG 4X10 WHT LF (GAUZE/BANDAGES/DRESSINGS) ×2 IMPLANT
TOWEL OR 17X24 6PK STRL BLUE (TOWEL DISPOSABLE) ×2 IMPLANT
TOWEL OR 17X26 10 PK STRL BLUE (TOWEL DISPOSABLE) ×4 IMPLANT
WAND HAND CNTRL MULTIVAC 90 (MISCELLANEOUS) IMPLANT
WATER STERILE IRR 1000ML POUR (IV SOLUTION) ×2 IMPLANT

## 2016-08-14 NOTE — Anesthesia Preprocedure Evaluation (Signed)
Anesthesia Evaluation  Patient identified by MRN, date of birth, ID band Patient awake    Reviewed: Allergy & Precautions, NPO status , Patient's Chart, lab work & pertinent test results  Airway Mallampati: II  TM Distance: >3 FB Neck ROM: Full    Dental no notable dental hx.    Pulmonary asthma , COPD, Current Smoker,    Pulmonary exam normal breath sounds clear to auscultation       Cardiovascular negative cardio ROS Normal cardiovascular exam Rhythm:Regular Rate:Normal     Neuro/Psych negative neurological ROS  negative psych ROS   GI/Hepatic negative GI ROS, Neg liver ROS,   Endo/Other  negative endocrine ROS  Renal/GU negative Renal ROS  negative genitourinary   Musculoskeletal negative musculoskeletal ROS (+)   Abdominal   Peds negative pediatric ROS (+)  Hematology negative hematology ROS (+)   Anesthesia Other Findings   Reproductive/Obstetrics negative OB ROS                             Anesthesia Physical Anesthesia Plan  ASA: II  Anesthesia Plan: General   Post-op Pain Management: GA combined w/ Regional for post-op pain   Induction: Intravenous  Airway Management Planned: Oral ETT  Additional Equipment:   Intra-op Plan:   Post-operative Plan: Extubation in OR  Informed Consent: I have reviewed the patients History and Physical, chart, labs and discussed the procedure including the risks, benefits and alternatives for the proposed anesthesia with the patient or authorized representative who has indicated his/her understanding and acceptance.   Dental advisory given  Plan Discussed with: CRNA and Surgeon  Anesthesia Plan Comments:         Anesthesia Quick Evaluation

## 2016-08-14 NOTE — Interval H&P Note (Signed)
History and Physical Interval Note:  08/14/2016 2:50 PM  Bradley [name]  has presented today for surgery, with the diagnosis of Right Shoulder Partial Biceps Tendon Tear and Complete Rotator Cuff Tear  The various methods of treatment have been discussed with the patient and family. After consideration of risks, benefits and other options for treatment, the patient has consented to  Procedure(s): RIGHT SHOULDER ARTHROSCOPY, BICEPS TENODESIS, OPEN ROTATOR CUFF REPAIR (Right) as a surgical intervention .  The patient's history has been reviewed, patient examined, no change in status, stable for surgery.  I have reviewed the patient's chart and labs.  Questions were answered to the patient's satisfaction.     Eldred MangesMark C Gertrude Tarbet

## 2016-08-14 NOTE — Anesthesia Procedure Notes (Signed)
Procedure Name: Intubation Date/Time: 08/14/2016 6:08 PM Performed by: Tillman AbideHAWKINS, Gracen Ringwald B Pre-anesthesia Checklist: Patient identified, Emergency Drugs available, Suction available and Patient being monitored Patient Re-evaluated:Patient Re-evaluated prior to inductionOxygen Delivery Method: Circle System Utilized Preoxygenation: Pre-oxygenation with 100% oxygen Intubation Type: IV induction Ventilation: Mask ventilation without difficulty Laryngoscope Size: Mac and 4 Grade View: Grade I Tube type: Oral Tube size: 7.5 mm Number of attempts: 1 Airway Equipment and Method: Stylet and Oral airway Placement Confirmation: ETT inserted through vocal cords under direct vision,  positive ETCO2 and breath sounds checked- equal and bilateral Secured at: 22 cm Tube secured with: Tape Dental Injury: Teeth and Oropharynx as per pre-operative assessment  Difficulty Due To: Difficulty was unanticipated

## 2016-08-14 NOTE — Anesthesia Procedure Notes (Signed)
Anesthesia Regional Block:  Interscalene brachial plexus block  Pre-Anesthetic Checklist: ,, timeout performed, Correct Patient, Correct Site, Correct Laterality, Correct Procedure, Correct Position, site marked, Risks and benefits discussed,  Surgical consent,  Pre-op evaluation,  At surgeon's request and post-op pain management  Laterality: Right  Prep: chloraprep       Needles:  Injection technique: Single-shot  Needle Type: Echogenic Needle     Needle Length: 9cm 9 cm Needle Gauge: 21 G    Additional Needles:  Procedures: ultrasound guided (picture in chart) Interscalene brachial plexus block Narrative:  Injection made incrementally with aspirations every 5 mL.  Performed by: Personally  Anesthesiologist: Earline Stiner  Additional Notes: Patient tolerated the procedure well without complications      

## 2016-08-14 NOTE — Brief Op Note (Signed)
08/14/2016  7:55 PM  PATIENT:  Bradley [name]  55 y.o. male  PRE-OPERATIVE DIAGNOSIS:  Right Shoulder Partial Biceps Tendon Tear and Complete Rotator Cuff Tear  POST-OPERATIVE DIAGNOSIS:  Right Shoulder Partial Biceps Tendon Tear and Complete Rotator Cuff Tear  PROCEDURE:  Procedure(s): RIGHT SHOULDER ARTHROSCOPY,DEBRIDEMENT, OPEN ROTATOR CUFF REPAIR (Right)  SURGEON:  Surgeon(s) and Role:    * Eldred MangesMark C Sahmya Arai, MD - Primary  PHYSICIAN ASSISTANT:   ASSISTANTS: none   ANESTHESIA:   local, regional and general  EBL:  Total I/O In: 1000 [I.V.:1000] Out: -   BLOOD ADMINISTERED:none  DRAINS: none   LOCAL MEDICATIONS USED:  MARCAINE     SPECIMEN:  No Specimen  DISPOSITION OF SPECIMEN:  N/A  COUNTS:  YES  TOURNIQUET:  * No tourniquets in log *  DICTATION: .Other Dictation: Dictation Number [id number]  PLAN OF CARE: Admit for overnight observation  PATIENT DISPOSITION:  PACU - hemodynamically stable.   Delay start of Pharmacological VTE agent (>24hrs) due to surgical blood loss or risk of bleeding: yes

## 2016-08-14 NOTE — H&P (Addendum)
Bradley [name] is an 55 y.o. male.   Chief Complaint: right shoulder pain HPI:  Patient with hx of right shoulder rotator cuff tear, impingement and biceps tendon tear presents with above complaint. Failed conservative treatment.    Past Medical History:  Diagnosis Date  . Anxiety    Citalopram controls "panic attacks"  . Asthma   . COPD (chronic obstructive pulmonary disease) (HCC)   . Dyspnea    with exertion  . History of kidney stones    x3-4 - lithotripsy  . Pneumonia    08/11/16- 3 years ago    Past Surgical History:  Procedure Laterality Date  . COLONOSCOPY W/ POLYPECTOMY    . LITHOTRIPSY    . MULTIPLE TOOTH EXTRACTIONS     19 teeth extracted at once  . TONSILLECTOMY      Family History  Problem Relation Age of Onset  . Heart disease Mother   . Colon cancer Mother    Social History:  reports that he has been smoking Cigarettes.  He has a 30.00 pack-year smoking history. He has never used smokeless tobacco. He reports that he does not drink alcohol or use drugs.  Allergies:  Allergies  Allergen Reactions  . Wellbutrin [Bupropion] Cough    No prescriptions prior to admission.    No results found for this or any previous visit (from the past 48 hour(s)). No results found.  Review of Systems  Constitutional: Negative.   HENT: Negative.   Respiratory: Negative.   Cardiovascular: Negative.   Gastrointestinal: Negative.   Genitourinary: Negative.   Musculoskeletal: Positive for joint pain.  Skin: Negative.   Neurological: Negative.   Psychiatric/Behavioral: Negative.     There were no vitals taken for this visit. Physical Exam  Constitutional: No distress.  HENT:  Head: Normocephalic and atraumatic.  Eyes: EOM are normal. Pupils are equal, round, and reactive to light.  Neck: Normal range of motion.  Respiratory: No respiratory distress.  GI: He exhibits no distension.  Musculoskeletal: He exhibits tenderness.  Right shoulder positive impingement  test.     MRI: CLINICAL DATA:  Right shoulder pain and weakness. Patient was moving a box. Felt a pop.  EXAM: MRI OF THE RIGHT SHOULDER WITHOUT CONTRAST  TECHNIQUE: Multiplanar, multisequence MR imaging of the shoulder was performed. No intravenous contrast was administered.  COMPARISON:  None.  FINDINGS: Rotator cuff: Complete tear of the supraspinatus and infraspinatus tendons with 3.4 cm of retraction. Teres minor tendon is intact. Subscapularis tendon is intact.  Muscles: Muscle edema in the supraspinatus, infraspinatus and teres minor muscles likely reflecting muscle strain. No atrophy of the rotator cuff muscles.  Biceps long head: Severe tendinosis of the intraarticular portion of the long head of the biceps tendon with a high-grade partial-thickness tear.  Acromioclavicular Joint: Moderate arthropathy of the acromioclavicular joint. Type I acromion.  Glenohumeral Joint: Small joint effusion.  No focal chondral defect.  Labrum: Grossly intact, but evaluation is limited by lack of intraarticular fluid.  Bones: No focal marrow signal abnormality. No fracture or dislocation.  Other: No fluid collection or hematoma.  IMPRESSION: 1. Complete tear of the supraspinatus and infraspinatus tendons with 3.4 cm of retraction. 2. Muscle edema in the supraspinatus, infraspinatus and teres minor muscles likely reflecting muscle strain. 3. Severe tendinosis of the intraarticular portion of the long head of the biceps tendon with a high-grade partial-thickness tear.   Electronically Signed   By: Elige Ko   On: 07/05/2016 08:45  Assessment/Plan Right shoulder RC/biceps  tendon tears, impingement.   Will proceed with right shoulder scope with debridement, open rotator cuff repair and biceps tenodesis.  Procedure along with possible risks and complications discussed.  All questions answered.    Naida SleightWENS,JAMES M, PA-C 08/14/2016, 11:37 AM

## 2016-08-14 NOTE — Anesthesia Postprocedure Evaluation (Signed)
Anesthesia Post Note  Patient: Ripley FraiseDavid M Cashion  Procedure(s) Performed: Procedure(s) (LRB): RIGHT SHOULDER ARTHROSCOPY,DEBRIDEMENT, OPEN ROTATOR CUFF REPAIR (Right)  Patient location during evaluation: PACU Anesthesia Type: General and Regional Level of consciousness: awake and alert Pain management: pain level controlled Vital Signs Assessment: post-procedure vital signs reviewed and stable Respiratory status: spontaneous breathing, nonlabored ventilation, respiratory function stable and patient connected to nasal cannula oxygen Cardiovascular status: blood pressure returned to baseline and stable Postop Assessment: no signs of nausea or vomiting Anesthetic complications: no    Last Vitals:  Vitals:   08/14/16 2113 08/14/16 2250  BP: 115/81 121/80  Pulse: 100 90  Resp: 20 18  Temp: 36.5 C 36.7 C    Last Pain:  Vitals:   08/14/16 2250  TempSrc: Oral  PainSc:                  Shelton SilvasKevin D Lenard Kampf

## 2016-08-14 NOTE — Transfer of Care (Signed)
Immediate Anesthesia Transfer of Care Note  Patient: Bradley FraiseDavid M Pilger  Procedure(s) Performed: Procedure(s): RIGHT SHOULDER ARTHROSCOPY,DEBRIDEMENT, OPEN ROTATOR CUFF REPAIR (Right)  Patient Location: PACU  Anesthesia Type:General  Level of Consciousness: awake, alert , oriented and patient cooperative  Airway & Oxygen Therapy: Patient Spontanous Breathing and Patient connected to nasal cannula oxygen  Post-op Assessment: Report given to RN and Post -op Vital signs reviewed and stable  Post vital signs: Reviewed and stable  Last Vitals:  Vitals:   08/14/16 1955 08/14/16 1958  BP:  121/67  Pulse:  (!) 103  Resp:  (!) 23  Temp: 36.3 C     Last Pain:  Vitals:   08/14/16 1955  PainSc: 0-No pain      Patients Stated Pain Goal: 4 (08/14/16 1340)  Complications: No apparent anesthesia complications   Denies SOB.  Saturation lower 90's. Breath sounds equal bilateral.  Expiratory wheezes noted in anterior bases.  Dr. Hart RochesterHollis at bedside.  Breathing treatment ordered.

## 2016-08-15 DIAGNOSIS — M75121 Complete rotator cuff tear or rupture of right shoulder, not specified as traumatic: Secondary | ICD-10-CM | POA: Diagnosis not present

## 2016-08-15 MED ORDER — OXYCODONE-ACETAMINOPHEN 5-325 MG PO TABS: 1.0000 | ORAL_TABLET | ORAL | 0 refills | Status: DC | PRN

## 2016-08-15 MED ORDER — METHOCARBAMOL 500 MG PO TABS: 500.0000 mg | ORAL_TABLET | Freq: Four times a day (QID) | ORAL | 0 refills | Status: DC

## 2016-08-15 NOTE — Discharge Instructions (Addendum)
Ok to shower with arm across chest , dry off with arm across chest then re-apply sling. See Dr. Ophelia CharterYates in one week

## 2016-08-15 NOTE — Progress Notes (Signed)
   Subjective: 1 Day Post-Op Procedure(s) (LRB): RIGHT SHOULDER ARTHROSCOPY,DEBRIDEMENT, OPEN ROTATOR CUFF REPAIR (Right) Patient reports pain as 0 on 0-10 scale.    Objective: Vital signs in last 24 hours: Temp:  [97.3 F (36.3 C)-98.3 F (36.8 C)] 97.6 F (36.4 C) (10/17 0400) Pulse Rate:  [90-103] 95 (10/17 0400) Resp:  [18-23] 18 (10/17 0400) BP: (100-121)/(62-88) 107/73 (10/17 0400) SpO2:  [87 %-94 %] 93 % (10/17 0400) Weight:  [90.7 kg (200 lb)] 90.7 kg (200 lb) (10/16 1318)  Intake/Output from previous day: 10/16 0701 - 10/17 0700 In: 1200 [I.V.:1200] Out: 50 [Blood:50] Intake/Output this shift: No intake/output data recorded.   Recent Labs  08/14/16 1352  HGB 15.8    Recent Labs  08/14/16 1352  WBC 9.5  RBC 5.11  HCT 46.3  PLT 216    Recent Labs  08/14/16 1352  NA 137  K 4.1  CL 103  CO2 25  BUN 6  CREATININE 0.88  GLUCOSE 92  CALCIUM 9.5    Recent Labs  08/14/16 1352  INR 1.06    block working well no pain.   Assessment/Plan: 1 Day Post-Op Procedure(s) (LRB): RIGHT SHOULDER ARTHROSCOPY,DEBRIDEMENT, OPEN ROTATOR CUFF REPAIR (Right) Plan: discharge home.   Eldred MangesMark C Quincey Quesinberry 08/15/2016, 7:34 AM

## 2016-08-15 NOTE — Progress Notes (Signed)
Patient alert and oriented, mae's well, voiding adequate amount of urine, swallowing without difficulty, no c/o pain at time of discharge. Patient discharged home with family. Script and discharged instructions given to patient. Patient and family stated understanding of instructions given. Patient has an appointment with Dr. yates in 1 week.   

## 2016-08-15 NOTE — Op Note (Signed)
NAME:  Darcus AustinUCKER, Hy                ACCOUNT NO.:  [id number]  MEDICAL RECORD NO.:  [id number]  LOCATION:  3C05C                        FACILITY:  [facility]  PHYSICIAN:  Efstathios Sawin C. Ophelia CharterYates, M.D.    DATE OF BIRTH:  August 09, 1961  DATE OF PROCEDURE:  08/14/2016 DATE OF DISCHARGE:  08/15/2016                              OPERATIVE REPORT   PREOPERATIVE DIAGNOSES:  Rotator cuff tear, biceps tendinopathy.  POSTOPERATIVE DIAGNOSES:  Ruptured biceps tendon and complete rotator cuff tear.  PROCEDURE:  Diagnostic and operative arthroscopy of right shoulder. Arthroscopic debridement of labral tear.  Rotator cuff debridement. Open rotator cuff repair.  UltraFix anchors x2 of complete retracted 3 x 4 cm tear.  Biceps tendon was torn completely with a frayed stump at the area where there was the superior labral tearing and fraying.  SURGEON:  Jari Dipasquale C. Ophelia CharterYates, M.D.  ASSISTANT:  None.  ANESTHESIA:  General plus preoperative block, plus 10 mL of Marcaine local.  DESCRIPTION OF PROCEDURE:  After induction of block, prepping and draping, with the beach-chair position, Ancef prophylaxis, shoulder exam demonstrated full range of motion.  No instability.  After prepping and draping, impervious stockinette, Coban, and time-out procedure, the scope was introduced posteriorly.  Shoulder was sequentially inspected. Articular cartilage looked good.  There was minimal chondromalacia.  No Hill-Sachs lesion.  No Bankart lesion.  Superior labrum showed that biceps tendon was completely torn.  There was a large retracted rotator cuff tear.  MRI showed minimal atrophy of the muscles.  Through the anterior portal, the tendon was grasped and advanced laterally, and appeared to have significant excursion, so that had the potential for repair.  Collene MaresShaver was used for debridement and inspection did not reveal any remnant of the biceps tendon as it exited the shoulder.  Incision was made along the anterior acromion, deltoid was  peeled off.  Acromial spur was removed.  No significant spur at the distal clavicle. Subacromial bursectomy was performed.  The rotator cuff was gently advanced.  Two UltraFix anchors were placed laterally over the greater tuberosity.  After freshening up the bone with a bare rongeur, pin was advanced and sutured back down to the bone.  Biceps tendon was able to be palpated distally in the groove, but was stable and retracted.  I attempted to hook it to pull it up, socket tenodesis was unsuccessful since it was stuck in its position.  The rotator cuff was rotated, head was covered.  The deltoid was repaired back to holes in the acromion with the UltraFix suture, 2-0 Vicryl, subcutaneous tissue, nylon in the anterior and posterior portal, and 4-0 Vicryl subcuticular skin closure.  Tincture of benzoin, Steri-Strips, postop dressing, and a sling was applied.  Instrument count and needle count were correct.     Stewart Sasaki C. Ophelia CharterYates, M.D.     MCY/MEDQ  D:  08/14/2016  T:  08/15/2016  Job:  [id number]

## 2016-08-16 ENCOUNTER — Encounter (HOSPITAL_COMMUNITY): Payer: Self-pay | Admitting: Orthopaedic Surgery

## 2016-08-17 ENCOUNTER — Inpatient Hospital Stay (HOSPITAL_COMMUNITY)
Admission: EM | Admit: 2016-08-17 | Discharge: 2016-09-08 | DRG: 004 | Disposition: A | Discharge status: Other Acute Inpatient Hospital | Payer: BLUE CROSS/BLUE SHIELD | Attending: Internal Medicine | Admitting: Internal Medicine

## 2016-08-17 ENCOUNTER — Emergency Department (HOSPITAL_COMMUNITY): Payer: BLUE CROSS/BLUE SHIELD

## 2016-08-17 ENCOUNTER — Encounter (HOSPITAL_COMMUNITY): Payer: Self-pay

## 2016-08-17 ENCOUNTER — Other Ambulatory Visit: Payer: Self-pay

## 2016-08-17 DIAGNOSIS — Z888 Allergy status to other drugs, medicaments and biological substances status: Secondary | ICD-10-CM

## 2016-08-17 DIAGNOSIS — J9601 Acute respiratory failure with hypoxia: Secondary | ICD-10-CM

## 2016-08-17 DIAGNOSIS — Z7951 Long term (current) use of inhaled steroids: Secondary | ICD-10-CM

## 2016-08-17 DIAGNOSIS — Z452 Encounter for adjustment and management of vascular access device: Secondary | ICD-10-CM

## 2016-08-17 DIAGNOSIS — G629 Polyneuropathy, unspecified: Secondary | ICD-10-CM | POA: Diagnosis present

## 2016-08-17 DIAGNOSIS — E8770 Fluid overload, unspecified: Secondary | ICD-10-CM | POA: Diagnosis present

## 2016-08-17 DIAGNOSIS — Z79899 Other long term (current) drug therapy: Secondary | ICD-10-CM

## 2016-08-17 DIAGNOSIS — Z930 Tracheostomy status: Secondary | ICD-10-CM

## 2016-08-17 DIAGNOSIS — R531 Weakness: Secondary | ICD-10-CM

## 2016-08-17 DIAGNOSIS — R0602 Shortness of breath: Secondary | ICD-10-CM | POA: Diagnosis not present

## 2016-08-17 DIAGNOSIS — E870 Hyperosmolality and hypernatremia: Secondary | ICD-10-CM | POA: Diagnosis present

## 2016-08-17 DIAGNOSIS — Z87442 Personal history of urinary calculi: Secondary | ICD-10-CM

## 2016-08-17 DIAGNOSIS — M75101 Unspecified rotator cuff tear or rupture of right shoulder, not specified as traumatic: Secondary | ICD-10-CM | POA: Diagnosis present

## 2016-08-17 DIAGNOSIS — R739 Hyperglycemia, unspecified: Secondary | ICD-10-CM | POA: Diagnosis not present

## 2016-08-17 DIAGNOSIS — F17210 Nicotine dependence, cigarettes, uncomplicated: Secondary | ICD-10-CM | POA: Diagnosis present

## 2016-08-17 DIAGNOSIS — Z4659 Encounter for fitting and adjustment of other gastrointestinal appliance and device: Secondary | ICD-10-CM

## 2016-08-17 DIAGNOSIS — J440 Chronic obstructive pulmonary disease with acute lower respiratory infection: Secondary | ICD-10-CM | POA: Diagnosis present

## 2016-08-17 DIAGNOSIS — G7281 Critical illness myopathy: Secondary | ICD-10-CM | POA: Diagnosis present

## 2016-08-17 DIAGNOSIS — J9690 Respiratory failure, unspecified, unspecified whether with hypoxia or hypercapnia: Secondary | ICD-10-CM

## 2016-08-17 DIAGNOSIS — R5383 Other fatigue: Secondary | ICD-10-CM

## 2016-08-17 DIAGNOSIS — G92 Toxic encephalopathy: Secondary | ICD-10-CM | POA: Diagnosis present

## 2016-08-17 DIAGNOSIS — G931 Anoxic brain damage, not elsewhere classified: Secondary | ICD-10-CM

## 2016-08-17 DIAGNOSIS — F410 Panic disorder [episodic paroxysmal anxiety] without agoraphobia: Secondary | ICD-10-CM | POA: Diagnosis present

## 2016-08-17 DIAGNOSIS — F329 Major depressive disorder, single episode, unspecified: Secondary | ICD-10-CM | POA: Diagnosis present

## 2016-08-17 DIAGNOSIS — Z8249 Family history of ischemic heart disease and other diseases of the circulatory system: Secondary | ICD-10-CM

## 2016-08-17 DIAGNOSIS — N179 Acute kidney failure, unspecified: Secondary | ICD-10-CM | POA: Diagnosis present

## 2016-08-17 DIAGNOSIS — R9401 Abnormal electroencephalogram [EEG]: Secondary | ICD-10-CM | POA: Diagnosis present

## 2016-08-17 DIAGNOSIS — R042 Hemoptysis: Secondary | ICD-10-CM | POA: Diagnosis not present

## 2016-08-17 DIAGNOSIS — N281 Cyst of kidney, acquired: Secondary | ICD-10-CM | POA: Diagnosis present

## 2016-08-17 DIAGNOSIS — K8020 Calculus of gallbladder without cholecystitis without obstruction: Secondary | ICD-10-CM | POA: Diagnosis present

## 2016-08-17 DIAGNOSIS — J189 Pneumonia, unspecified organism: Secondary | ICD-10-CM | POA: Diagnosis present

## 2016-08-17 DIAGNOSIS — J9600 Acute respiratory failure, unspecified whether with hypoxia or hypercapnia: Secondary | ICD-10-CM

## 2016-08-17 DIAGNOSIS — Z978 Presence of other specified devices: Secondary | ICD-10-CM

## 2016-08-17 DIAGNOSIS — R4182 Altered mental status, unspecified: Secondary | ICD-10-CM

## 2016-08-17 DIAGNOSIS — J441 Chronic obstructive pulmonary disease with (acute) exacerbation: Secondary | ICD-10-CM | POA: Diagnosis present

## 2016-08-17 DIAGNOSIS — R401 Stupor: Secondary | ICD-10-CM

## 2016-08-17 DIAGNOSIS — J80 Acute respiratory distress syndrome: Secondary | ICD-10-CM

## 2016-08-17 DIAGNOSIS — M75121 Complete rotator cuff tear or rupture of right shoulder, not specified as traumatic: Secondary | ICD-10-CM | POA: Diagnosis present

## 2016-08-17 DIAGNOSIS — I272 Pulmonary hypertension, unspecified: Secondary | ICD-10-CM | POA: Diagnosis present

## 2016-08-17 DIAGNOSIS — Z9289 Personal history of other medical treatment: Secondary | ICD-10-CM

## 2016-08-17 DIAGNOSIS — A419 Sepsis, unspecified organism: Secondary | ICD-10-CM | POA: Diagnosis not present

## 2016-08-17 DIAGNOSIS — E874 Mixed disorder of acid-base balance: Secondary | ICD-10-CM | POA: Diagnosis not present

## 2016-08-17 DIAGNOSIS — M6281 Muscle weakness (generalized): Secondary | ICD-10-CM

## 2016-08-17 DIAGNOSIS — Z9911 Dependence on respirator [ventilator] status: Secondary | ICD-10-CM

## 2016-08-17 DIAGNOSIS — F32A Depression, unspecified: Secondary | ICD-10-CM | POA: Diagnosis present

## 2016-08-17 DIAGNOSIS — J8410 Pulmonary fibrosis, unspecified: Secondary | ICD-10-CM | POA: Diagnosis present

## 2016-08-17 HISTORY — DX: Pulmonary fibrosis, unspecified: J84.10

## 2016-08-17 LAB — BASIC METABOLIC PANEL
ANION GAP: 10 (ref 5–15)
BUN: 8 mg/dL (ref 6–20)
CO2: 23 mmol/L (ref 22–32)
Calcium: 8.4 mg/dL — ABNORMAL LOW (ref 8.9–10.3)
Chloride: 101 mmol/L (ref 101–111)
Creatinine, Ser: 0.85 mg/dL (ref 0.61–1.24)
GFR calc Af Amer: >60 mL/min (ref >60)
GLUCOSE: 151 mg/dL — AB (ref 65–99)
POTASSIUM: 3.5 mmol/L (ref 3.5–5.1)
Sodium: 134 mmol/L — ABNORMAL LOW (ref 135–145)

## 2016-08-17 LAB — BRAIN NATRIURETIC PEPTIDE: B Natriuretic Peptide: 110.2 pg/mL — ABNORMAL HIGH (ref 0.0–100.0)

## 2016-08-17 LAB — CBC WITH DIFFERENTIAL/PLATELET
BASOS ABS: 0 10*3/uL (ref 0.0–0.1)
Basophils Relative: 0 %
EOS PCT: 1 %
Eosinophils Absolute: 0.1 10*3/uL (ref 0.0–0.7)
HEMATOCRIT: 39 % (ref 39.0–52.0)
Hemoglobin: 13.6 g/dL (ref 13.0–17.0)
LYMPHS PCT: 11 %
Lymphs Abs: 1.4 10*3/uL (ref 0.7–4.0)
MCH: 31 pg (ref 26.0–34.0)
MCHC: 34.9 g/dL (ref 30.0–36.0)
MCV: 88.8 fL (ref 78.0–100.0)
Monocytes Absolute: 0.5 10*3/uL (ref 0.1–1.0)
Monocytes Relative: 4 %
NEUTROS ABS: 11 10*3/uL — AB (ref 1.7–7.7)
Neutrophils Relative %: 84 %
PLATELETS: 186 10*3/uL (ref 150–400)
RBC: 4.39 MIL/uL (ref 4.22–5.81)
RDW: 13 % (ref 11.5–15.5)
WBC: 13 10*3/uL — AB (ref 4.0–10.5)

## 2016-08-17 LAB — D-DIMER, QUANTITATIVE: D-Dimer, Quant: 1.13 ug/mL-FEU — ABNORMAL HIGH (ref 0.00–0.50)

## 2016-08-17 MED ORDER — IOPAMIDOL (ISOVUE-370) INJECTION 76%
INTRAVENOUS | Status: AC
  Administered 2016-08-17: 100 mL
  Filled 2016-08-17: qty 100

## 2016-08-17 MED ORDER — ALBUTEROL (5 MG/ML) CONTINUOUS INHALATION SOLN
10.0000 mg/h | INHALATION_SOLUTION | Freq: Once | RESPIRATORY_TRACT | Status: AC
  Administered 2016-08-17: 10 mg/h via RESPIRATORY_TRACT
  Filled 2016-08-17: qty 20

## 2016-08-17 MED ORDER — PREDNISONE 20 MG PO TABS
60.0000 mg | ORAL_TABLET | Freq: Once | ORAL | Status: AC
  Administered 2016-08-17: 60 mg via ORAL
  Filled 2016-08-17: qty 3

## 2016-08-17 MED ORDER — IPRATROPIUM-ALBUTEROL 0.5-2.5 (3) MG/3ML IN SOLN
3.0000 mL | RESPIRATORY_TRACT | Status: DC
  Filled 2016-08-17: qty 3

## 2016-08-17 NOTE — ED Triage Notes (Signed)
Pt arrived via [facility] from home c/o respiratory distress with hx of pulmonary fibrosis and asthma.  Rotator cuff surgery on Monday.  Denies any pain

## 2016-08-17 NOTE — ED Provider Notes (Signed)
Pt seen and evaluated.  D/W Dr. August Saucerean.  Patient with history of asthma on Advair) aware, as well as primary fibrosis. He follows with pulmonary, Dr. Sandrea HughsMichael Wert.  He has no rashes or home. He had a outpatient laparoscopic right rotator cuff repair 4 days ago. He's been doing well. He describes progressive dyspnea today and sudden onset of worsening symptoms tonight. No chest pain but marked shortness of breath. Uses inhaler at home.  Are called. Found to be hypoxemic in the 80s and tachycardic. Given continuous nebulized up-year-old in route area and arrives with saturations here after neb of 88% on 5 L and tachycardic at 120. He states he feels "great".  On exam he has diffuse dry crackles globally diminished breath sounds and minimal air movement. Chest x-ray shows no fibrosis pattern.  I discussed the case with Dr. August Saucerean. Recommended CT scan to rule out pulmonary embolus. Continues nebulized albuterol. His been given steroids. We'll reevaluate for progress. If no clot, and continued subjective improvement, and is not hypoxemic patient has desire to be discharged. However remains tachycardic and hypoxemic pending treatment.   Rolland PorterMark Kinzley Savell, MD 08/17/16 2231

## 2016-08-17 NOTE — ED Provider Notes (Signed)
[facility]-EMERGENCY DEPT Provider Note   CSN: [id number] Arrival date & time: 08/17/16  2203     History   Chief Complaint Chief Complaint  Patient presents with  . Respiratory Distress    HPI Bradley [name] is a 55 y.o. male.  The history is provided by the patient.  Shortness of Breath  This is a new problem. The problem occurs continuously.The current episode started 3 to 5 hours ago. The problem has not changed since onset.Associated symptoms include wheezing. Pertinent negatives include no fever, no rhinorrhea, no sore throat, no ear pain, no cough, no chest pain, no syncope, no vomiting, no abdominal pain and no rash. Risk factors include smoking (recent surgery). He has tried beta-agonist inhalers for the symptoms. The treatment provided mild relief. He has had no prior hospitalizations. Associated medical issues include asthma and chronic lung disease.    Past Medical History:  Diagnosis Date  . Anxiety    Citalopram controls "panic attacks"  . Asthma   . COPD (chronic obstructive pulmonary disease) (HCC)   . Dyspnea    with exertion  . History of kidney stones    x3-4 - lithotripsy  . Pneumonia    08/11/16- 3 years ago  . Pulmonary fibrosis [facility])     Patient Active Problem List   Diagnosis Date Noted  . Complete tear of right rotator cuff 08/14/2016  . Right rotator cuff tear 08/14/2016  . Cigarette smoker 07/23/2015  . Obesity 07/23/2015  . Pulmonary fibrosis (HCC) 07/22/2015    Past Surgical History:  Procedure Laterality Date  . COLONOSCOPY W/ POLYPECTOMY    . LITHOTRIPSY    . MULTIPLE TOOTH EXTRACTIONS     19 teeth extracted at once  . SHOULDER ARTHROSCOPY WITH ROTATOR CUFF REPAIR Right 08/14/2016   Procedure: RIGHT SHOULDER ARTHROSCOPY,DEBRIDEMENT, OPEN ROTATOR CUFF REPAIR;  Surgeon: Eldred Manges, MD;  Location: [facility] OR;  Service: Orthopedics;  Laterality: Right;  . TONSILLECTOMY         Home Medications    Prior to Admission medications     Medication Sig Start Date End Date Taking? Authorizing Provider  albuterol (PROVENTIL HFA;VENTOLIN HFA) 108 (90 BASE) MCG/ACT inhaler Inhale 1 puff into the lungs every 6 (six) hours as needed for wheezing or shortness of breath.    Historical Provider, MD  citalopram (CELEXA) 20 MG tablet Take 30 mg by mouth every morning.     Historical Provider, MD  fexofenadine-pseudoephedrine (ALLEGRA-D) 60-120 MG 12 hr tablet Take 1 tablet by mouth every morning.     Historical Provider, MD  Fluticasone-Salmeterol (ADVAIR) 250-50 MCG/DOSE AEPB Inhale 1 puff into the lungs 2 (two) times daily.    Historical Provider, MD  ibuprofen (ADVIL,MOTRIN) 200 MG tablet Take 600 mg by mouth every 6 (six) hours as needed for mild pain.    Historical Provider, MD  methocarbamol (ROBAXIN) 500 MG tablet Take 1 tablet (500 mg total) by mouth 4 (four) times daily. 08/15/16   Eldred Manges, MD  oxyCODONE-acetaminophen (ROXICET) 5-325 MG tablet Take 1-2 tablets by mouth every 4 (four) hours as needed for severe pain. 08/15/16   Eldred Manges, MD    Family History Family History  Problem Relation Age of Onset  . Heart disease Mother   . Colon cancer Mother     Social History Social History  Substance Use Topics  . Smoking status: Current Every Day Smoker    Packs/day: 1.00    Years: 30.00    Types: Cigarettes  .  Smokeless tobacco: Never Used  . Alcohol use No     Allergies   Wellbutrin [bupropion]   Review of Systems Review of Systems  Constitutional: Negative for chills and fever.  HENT: Negative for ear pain, rhinorrhea and sore throat.   Eyes: Negative for pain and visual disturbance.  Respiratory: Positive for shortness of breath and wheezing. Negative for cough.   Cardiovascular: Negative for chest pain, palpitations and syncope.  Gastrointestinal: Negative for abdominal pain and vomiting.  Genitourinary: Negative for dysuria and hematuria.  Musculoskeletal: Negative for arthralgias and back pain.   Skin: Negative for color change and rash.  Neurological: Negative for seizures and syncope.  All other systems reviewed and are negative.    Physical Exam Updated Vital Signs Pulse (!) 128   Temp 98.6 F (37 C) (Oral)   Resp 17   SpO2 92%   Physical Exam  Constitutional: He is oriented to person, place, and time. He appears well-developed and well-nourished.  HENT:  Head: Normocephalic and atraumatic.  Eyes: Conjunctivae and EOM are normal. Pupils are equal, round, and reactive to light.  Neck: Normal range of motion. Neck supple.  Cardiovascular: Regular rhythm.   tachycardic  Pulmonary/Chest: No respiratory distress. He has wheezes. He has rales.  Tachypnea  Abdominal: Soft. There is no tenderness.  Musculoskeletal: He exhibits no edema.  Right arm in sling.  Neurological: He is alert and oriented to person, place, and time.  Skin: Skin is warm and dry.  Psychiatric: He has a normal mood and affect.  Nursing note and vitals reviewed.    ED Treatments / Results  Labs (all labs ordered are listed, but only abnormal results are displayed) Labs Reviewed  CBC WITH DIFFERENTIAL/PLATELET - Abnormal; Notable for the following:       Result Value   WBC 13.0 (*)    Neutro Abs 11.0 (*)    All other components within normal limits  BASIC METABOLIC PANEL - Abnormal; Notable for the following:    Sodium 134 (*)    Glucose, Bld 151 (*)    Calcium 8.4 (*)    All other components within normal limits  BRAIN NATRIURETIC PEPTIDE - Abnormal; Notable for the following:    B Natriuretic Peptide 110.2 (*)    All other components within normal limits  D-DIMER, QUANTITATIVE (NOT AT [facility]) - Abnormal; Notable for the following:    D-Dimer, Quant 1.13 (*)    All other components within normal limits  LACTIC ACID, PLASMA - Abnormal; Notable for the following:    Lactic Acid, Venous 2.4 (*)    All other components within normal limits  PROTIME-INR - Abnormal; Notable for the following:     Prothrombin Time 15.3 (*)    All other components within normal limits  MRSA PCR SCREENING  RESPIRATORY PANEL BY PCR  CULTURE, BLOOD (ROUTINE X 2)  CULTURE, BLOOD (ROUTINE X 2)  CULTURE, EXPECTORATED SPUTUM-ASSESSMENT  GRAM STAIN  INFLUENZA PANEL BY PCR (TYPE A & B, H1N1)  STREP PNEUMONIAE URINARY ANTIGEN  LACTIC ACID, PLASMA  PROCALCITONIN  APTT  HIV ANTIBODY (ROUTINE TESTING)  LEGIONELLA PNEUMOPHILA SEROGP 1 UR AG    EKG  EKG Interpretation  Date/Time:  Thursday August 17 2016 22:12:06 EDT Ventricular Rate:  127 PR Interval:    QRS Duration: 86 QT Interval:  330 QTC Calculation: 480 R Axis:   89 Text Interpretation:  Sinus tachycardia Borderline prolonged QT interval Confirmed by Fayrene FearingJAMES  MD, MARK ([id number]) on 08/17/2016 10:15:05 PM  Radiology Ct Angio Chest Pe W Or Wo Contrast  Result Date: 08/18/2016 CLINICAL DATA:  Acute onset of respiratory distress. Recent rotator cuff surgery. Initial encounter. EXAM: CT ANGIOGRAPHY CHEST WITH CONTRAST TECHNIQUE: Multidetector CT imaging of the chest was performed using the standard protocol during bolus administration of intravenous contrast. Multiplanar CT image reconstructions and MIPs were obtained to evaluate the vascular anatomy. CONTRAST:  100 mL of Isovue 370 IV contrast COMPARISON:  Chest radiograph performed earlier today at 10:23 p.m., and CT of the chest performed 07/14/2015 FINDINGS: Cardiovascular: There is no evidence of significant pulmonary embolus. Scattered coronary artery calcifications are seen. The heart remains normal in size. The thoracic aorta is grossly unremarkable. The great vessels are grossly unremarkable. Mediastinum/Nodes: Visualized mediastinal nodes measure up to 1.9 cm in short axis. Enlarged mediastinal nodes are noted at the subcarinal, aortopulmonary window, right paratracheal and periaortic regions. No pericardial effusion is identified. The thyroid gland is unremarkable. No axillary  lymphadenopathy is seen. Lungs/Pleura: Diffuse bilateral regional ground-glass airspace opacification is noted, with underlying diffuse emphysema and interstitial prominence. This reflects markedly worsening bilateral airspace densities, superimposed on the patient's fibrotic change. A variety of etiologies of pneumonia or pneumonitis could have such an appearance. Atypical infection is a concern. No pleural effusion or pneumothorax is seen. Upper Abdomen: The visualized portions of the liver and spleen are grossly unremarkable. Small stones are suggested dependently within the gallbladder. The visualized portions of the pancreas, adrenal glands and kidneys are unremarkable, aside from small bilateral renal cysts, measuring up to 2.7 cm in size. Musculoskeletal: No acute osseous abnormalities are identified. The visualized musculature is unremarkable in appearance. Review of the MIP images confirms the above findings. IMPRESSION: 1. No evidence of significant pulmonary embolus. 2. Diffuse bilateral regional ground-glass airspace opacification, with underlying emphysema and interstitial prominence. This is markedly worsened from 2016, reflecting acute airspace disease superimposed on the patient's fibrotic change. A variety of etiologies of pneumonia or pneumonitis could have such an appearance. Atypical infection is a concern. 3. Enlarged mediastinal nodes, measuring up to 1.9 cm in short axis. Some of these would be amenable to biopsy, as deemed clinically appropriate. 4. Scattered coronary artery calcifications seen. 5. Cholelithiasis. 6. Scattered bilateral renal cysts. Electronically Signed   By: Roanna Raider M.D.   On: 08/18/2016 00:58   Dg Chest Portable 1 View  Result Date: 08/17/2016 CLINICAL DATA:  Increasing shortness of breath this evening. History of COPD, fibrosis. EXAM: PORTABLE CHEST 1 VIEW COMPARISON:  Chest radiograph August 14, 2016 FINDINGS: Cardiomediastinal silhouette is normal.  Increasing interstitial prominence and patchy mid and lower lung zone airspace opacities. No pleural effusion. No pneumothorax. Biapical pleural thickening. RIGHT humeral head suture anchors. IMPRESSION: Increasing interstitial and to lesser extent alveolar airspace opacities concerning for pneumonia superimposed on a background of chronic fibrosis/COPD. Electronically Signed   By: Awilda Metro M.D.   On: 08/17/2016 23:09    Procedures Procedures (including critical care time)  Medications Ordered in ED Medications  ipratropium-albuterol (DUONEB) 0.5-2.5 (3) MG/3ML nebulizer solution 3 mL (not administered)  predniSONE (DELTASONE) tablet 60 mg (not administered)     Initial Impression / Assessment and Plan / ED Course  I have reviewed the triage vital signs and the nursing notes.  Pertinent labs & imaging results that were available during my care of the patient were reviewed by me and considered in my medical decision making (see chart for details).  Clinical Course   Mr. Schafer is a 55 year old male with  past medical history significant for asthma, pulmonary fibrosis, and recent orthopedic surgery.  He presents for acute shortness of breath with wheezing and rhonchi.  Patient treated with prednisone and albuterol with mild improvement.  EKG obtained, demonstrates sinus tachycardia with borderline QT prolongation.  Chest x-ray obtained, personally reviewed by me, demonstrates increasing interstitial opacities concerning for pneumonia.  Labs ordered including CBC, BMP, BNP, d-dimer.  Results significant for leukocytosis, positive d-dimer.  CTA chest obtained, demonstrates worsening fibrotic changes and/or pneumonia, enlarged mediastinal lymph nodes, cholelithiasis, bilateral renal cysts.  Patient has markedly increased oxygen requirement and remains borderline hypoxic.  Antibiotics given for community acquired pneumonia.  Patient is admitted to the hospitalist for further  workup and treatment.   Final Clinical Impressions(s) / ED Diagnoses   Final diagnoses:  Acute respiratory failure with hypoxia (HCC)  Community acquired pneumonia, unspecified laterality    New Prescriptions New Prescriptions   No medications on file     Garey Ham, MD 08/18/16 1204    Rolland Porter, MD 08/28/16 2124

## 2016-08-18 DIAGNOSIS — G7281 Critical illness myopathy: Secondary | ICD-10-CM | POA: Diagnosis present

## 2016-08-18 DIAGNOSIS — R4182 Altered mental status, unspecified: Secondary | ICD-10-CM | POA: Diagnosis not present

## 2016-08-18 DIAGNOSIS — Z978 Presence of other specified devices: Secondary | ICD-10-CM | POA: Diagnosis not present

## 2016-08-18 DIAGNOSIS — F17210 Nicotine dependence, cigarettes, uncomplicated: Secondary | ICD-10-CM | POA: Diagnosis present

## 2016-08-18 DIAGNOSIS — J8410 Pulmonary fibrosis, unspecified: Secondary | ICD-10-CM | POA: Diagnosis present

## 2016-08-18 DIAGNOSIS — M75101 Unspecified rotator cuff tear or rupture of right shoulder, not specified as traumatic: Secondary | ICD-10-CM

## 2016-08-18 DIAGNOSIS — G629 Polyneuropathy, unspecified: Secondary | ICD-10-CM | POA: Diagnosis present

## 2016-08-18 DIAGNOSIS — R739 Hyperglycemia, unspecified: Secondary | ICD-10-CM | POA: Diagnosis not present

## 2016-08-18 DIAGNOSIS — R208 Other disturbances of skin sensation: Secondary | ICD-10-CM | POA: Diagnosis not present

## 2016-08-18 DIAGNOSIS — N179 Acute kidney failure, unspecified: Secondary | ICD-10-CM | POA: Diagnosis present

## 2016-08-18 DIAGNOSIS — F410 Panic disorder [episodic paroxysmal anxiety] without agoraphobia: Secondary | ICD-10-CM | POA: Diagnosis present

## 2016-08-18 DIAGNOSIS — J9600 Acute respiratory failure, unspecified whether with hypoxia or hypercapnia: Secondary | ICD-10-CM | POA: Diagnosis not present

## 2016-08-18 DIAGNOSIS — J9601 Acute respiratory failure with hypoxia: Secondary | ICD-10-CM | POA: Diagnosis not present

## 2016-08-18 DIAGNOSIS — N281 Cyst of kidney, acquired: Secondary | ICD-10-CM | POA: Diagnosis present

## 2016-08-18 DIAGNOSIS — J441 Chronic obstructive pulmonary disease with (acute) exacerbation: Secondary | ICD-10-CM | POA: Diagnosis present

## 2016-08-18 DIAGNOSIS — Z9911 Dependence on respirator [ventilator] status: Secondary | ICD-10-CM | POA: Diagnosis not present

## 2016-08-18 DIAGNOSIS — J440 Chronic obstructive pulmonary disease with acute lower respiratory infection: Secondary | ICD-10-CM | POA: Diagnosis present

## 2016-08-18 DIAGNOSIS — J189 Pneumonia, unspecified organism: Secondary | ICD-10-CM | POA: Diagnosis present

## 2016-08-18 DIAGNOSIS — M79661 Pain in right lower leg: Secondary | ICD-10-CM | POA: Diagnosis not present

## 2016-08-18 DIAGNOSIS — F32A Depression, unspecified: Secondary | ICD-10-CM | POA: Diagnosis present

## 2016-08-18 DIAGNOSIS — R9401 Abnormal electroencephalogram [EEG]: Secondary | ICD-10-CM | POA: Diagnosis present

## 2016-08-18 DIAGNOSIS — E870 Hyperosmolality and hypernatremia: Secondary | ICD-10-CM | POA: Diagnosis present

## 2016-08-18 DIAGNOSIS — J181 Lobar pneumonia, unspecified organism: Secondary | ICD-10-CM | POA: Diagnosis not present

## 2016-08-18 DIAGNOSIS — R0989 Other specified symptoms and signs involving the circulatory and respiratory systems: Secondary | ICD-10-CM | POA: Diagnosis not present

## 2016-08-18 DIAGNOSIS — M75121 Complete rotator cuff tear or rupture of right shoulder, not specified as traumatic: Secondary | ICD-10-CM | POA: Diagnosis present

## 2016-08-18 DIAGNOSIS — A419 Sepsis, unspecified organism: Secondary | ICD-10-CM

## 2016-08-18 DIAGNOSIS — G92 Toxic encephalopathy: Secondary | ICD-10-CM | POA: Diagnosis present

## 2016-08-18 DIAGNOSIS — J80 Acute respiratory distress syndrome: Secondary | ICD-10-CM | POA: Diagnosis present

## 2016-08-18 DIAGNOSIS — R042 Hemoptysis: Secondary | ICD-10-CM | POA: Diagnosis not present

## 2016-08-18 DIAGNOSIS — G9340 Encephalopathy, unspecified: Secondary | ICD-10-CM | POA: Diagnosis not present

## 2016-08-18 DIAGNOSIS — E874 Mixed disorder of acid-base balance: Secondary | ICD-10-CM | POA: Diagnosis not present

## 2016-08-18 DIAGNOSIS — I272 Pulmonary hypertension, unspecified: Secondary | ICD-10-CM | POA: Diagnosis present

## 2016-08-18 DIAGNOSIS — F329 Major depressive disorder, single episode, unspecified: Secondary | ICD-10-CM | POA: Diagnosis present

## 2016-08-18 DIAGNOSIS — R0602 Shortness of breath: Secondary | ICD-10-CM | POA: Diagnosis present

## 2016-08-18 DIAGNOSIS — E8770 Fluid overload, unspecified: Secondary | ICD-10-CM | POA: Diagnosis present

## 2016-08-18 DIAGNOSIS — K8020 Calculus of gallbladder without cholecystitis without obstruction: Secondary | ICD-10-CM | POA: Diagnosis present

## 2016-08-18 LAB — RESPIRATORY PANEL BY PCR
ADENOVIRUS-RVPPCR: NOT DETECTED
Bordetella pertussis: NOT DETECTED
CORONAVIRUS 229E-RVPPCR: NOT DETECTED
CORONAVIRUS NL63-RVPPCR: NOT DETECTED
CORONAVIRUS OC43-RVPPCR: NOT DETECTED
Chlamydophila pneumoniae: NOT DETECTED
Coronavirus HKU1: NOT DETECTED
INFLUENZA B-RVPPCR: NOT DETECTED
Influenza A: NOT DETECTED
METAPNEUMOVIRUS-RVPPCR: NOT DETECTED
MYCOPLASMA PNEUMONIAE-RVPPCR: NOT DETECTED
PARAINFLUENZA VIRUS 1-RVPPCR: NOT DETECTED
PARAINFLUENZA VIRUS 2-RVPPCR: NOT DETECTED
PARAINFLUENZA VIRUS 4-RVPPCR: NOT DETECTED
Parainfluenza Virus 3: NOT DETECTED
RESPIRATORY SYNCYTIAL VIRUS-RVPPCR: NOT DETECTED
Rhinovirus / Enterovirus: NOT DETECTED

## 2016-08-18 LAB — PROTIME-INR
INR: 1.2
PROTHROMBIN TIME: 15.3 s — AB (ref 11.4–15.2)

## 2016-08-18 LAB — HIV ANTIBODY (ROUTINE TESTING W REFLEX): HIV Screen 4th Generation wRfx: NONREACTIVE

## 2016-08-18 LAB — LACTIC ACID, PLASMA
LACTIC ACID, VENOUS: 2.4 mmol/L — AB (ref 0.5–1.9)
Lactic Acid, Venous: 1.6 mmol/L (ref 0.5–1.9)

## 2016-08-18 LAB — INFLUENZA PANEL BY PCR (TYPE A & B)
H1N1 flu by pcr: NOT DETECTED
Influenza A By PCR: NEGATIVE
Influenza B By PCR: NEGATIVE

## 2016-08-18 LAB — PROCALCITONIN: PROCALCITONIN: 0.34 ng/mL

## 2016-08-18 LAB — APTT: APTT: 34 s (ref 24–36)

## 2016-08-18 LAB — STREP PNEUMONIAE URINARY ANTIGEN: STREP PNEUMO URINARY ANTIGEN: NEGATIVE

## 2016-08-18 LAB — MRSA PCR SCREENING: MRSA by PCR: NEGATIVE

## 2016-08-18 MED ORDER — NICOTINE 21 MG/24HR TD PT24
21.0000 mg | MEDICATED_PATCH | Freq: Every day | TRANSDERMAL | Status: DC
  Administered 2016-08-18 – 2016-08-19 (×2): 21 mg via TRANSDERMAL
  Filled 2016-08-18 (×3): qty 1

## 2016-08-18 MED ORDER — IPRATROPIUM BROMIDE 0.02 % IN SOLN
0.5000 mg | Freq: Four times a day (QID) | RESPIRATORY_TRACT | Status: DC
  Administered 2016-08-18 – 2016-09-07 (×82): 0.5 mg via RESPIRATORY_TRACT
  Filled 2016-08-18 (×83): qty 2.5

## 2016-08-18 MED ORDER — LEVALBUTEROL HCL 1.25 MG/0.5ML IN NEBU
1.2500 mg | INHALATION_SOLUTION | Freq: Four times a day (QID) | RESPIRATORY_TRACT | Status: DC
  Administered 2016-08-18: 1.25 mg via RESPIRATORY_TRACT
  Filled 2016-08-18 (×2): qty 0.5

## 2016-08-18 MED ORDER — LORATADINE 10 MG PO TABS
10.0000 mg | ORAL_TABLET | Freq: Every day | ORAL | Status: DC
  Administered 2016-08-18 – 2016-08-19 (×2): 10 mg via ORAL
  Filled 2016-08-18 (×2): qty 1

## 2016-08-18 MED ORDER — SODIUM CHLORIDE 0.9 % IV BOLUS (SEPSIS)
2000.0000 mL | Freq: Once | INTRAVENOUS | Status: AC
  Administered 2016-08-18: 2000 mL via INTRAVENOUS

## 2016-08-18 MED ORDER — DEXTROSE 5 % IV SOLN
500.0000 mg | Freq: Once | INTRAVENOUS | Status: AC
  Administered 2016-08-18: 500 mg via INTRAVENOUS
  Filled 2016-08-18: qty 500

## 2016-08-18 MED ORDER — METHYLPREDNISOLONE SODIUM SUCC 125 MG IJ SOLR
60.0000 mg | Freq: Four times a day (QID) | INTRAMUSCULAR | Status: DC
  Administered 2016-08-18 – 2016-08-24 (×23): 60 mg via INTRAVENOUS
  Filled 2016-08-18 (×24): qty 2

## 2016-08-18 MED ORDER — IBUPROFEN 200 MG PO TABS: 600.0000 mg | ORAL_TABLET | Freq: Four times a day (QID) | ORAL | Status: DC | PRN

## 2016-08-18 MED ORDER — CITALOPRAM HYDROBROMIDE 20 MG PO TABS
30.0000 mg | ORAL_TABLET | Freq: Every morning | ORAL | Status: DC
  Administered 2016-08-18 – 2016-08-19 (×2): 30 mg via ORAL
  Filled 2016-08-18: qty 1
  Filled 2016-08-18 (×2): qty 2

## 2016-08-18 MED ORDER — METHYLPREDNISOLONE SODIUM SUCC 125 MG IJ SOLR
60.0000 mg | Freq: Two times a day (BID) | INTRAMUSCULAR | Status: DC
  Administered 2016-08-18: 60 mg via INTRAVENOUS
  Filled 2016-08-18: qty 2

## 2016-08-18 MED ORDER — DEXTROSE 5 % IV SOLN
1.0000 g | Freq: Once | INTRAVENOUS | Status: AC
  Administered 2016-08-18: 1 g via INTRAVENOUS
  Filled 2016-08-18: qty 10

## 2016-08-18 MED ORDER — AZITHROMYCIN 500 MG PO TABS
500.0000 mg | ORAL_TABLET | ORAL | Status: DC
Start: 2016-08-19 — End: 2016-08-19
  Administered 2016-08-19: 500 mg via ORAL
  Filled 2016-08-18: qty 1

## 2016-08-18 MED ORDER — METHOCARBAMOL 500 MG PO TABS
500.0000 mg | ORAL_TABLET | Freq: Four times a day (QID) | ORAL | Status: DC
  Administered 2016-08-18 – 2016-08-19 (×6): 500 mg via ORAL
  Filled 2016-08-18 (×9): qty 1

## 2016-08-18 MED ORDER — DEXTROSE 5 % IV SOLN
1.0000 g | INTRAVENOUS | Status: DC
  Administered 2016-08-19: 1 g via INTRAVENOUS
  Filled 2016-08-18: qty 10

## 2016-08-18 MED ORDER — ENOXAPARIN SODIUM 40 MG/0.4ML ~~LOC~~ SOLN
40.0000 mg | SUBCUTANEOUS | Status: DC
  Administered 2016-08-18 – 2016-09-05 (×18): 40 mg via SUBCUTANEOUS
  Filled 2016-08-18 (×21): qty 0.4

## 2016-08-18 MED ORDER — SODIUM CHLORIDE 0.9 % IV SOLN
INTRAVENOUS | Status: DC
  Administered 2016-08-18 – 2016-08-19 (×4): via INTRAVENOUS

## 2016-08-18 MED ORDER — IBUPROFEN 200 MG PO TABS
800.0000 mg | ORAL_TABLET | Freq: Three times a day (TID) | ORAL | Status: DC
  Administered 2016-08-18 – 2016-08-19 (×3): 800 mg via ORAL
  Filled 2016-08-18 (×3): qty 4

## 2016-08-18 MED ORDER — SODIUM CHLORIDE 0.9 % IV BOLUS (SEPSIS)
500.0000 mL | Freq: Once | INTRAVENOUS | Status: AC
  Administered 2016-08-18: 500 mL via INTRAVENOUS

## 2016-08-18 MED ORDER — OXYCODONE-ACETAMINOPHEN 5-325 MG PO TABS
1.0000 | ORAL_TABLET | ORAL | Status: DC | PRN
Start: 2016-08-18 — End: 2016-08-19
  Administered 2016-08-18 – 2016-08-19 (×3): 2 via ORAL
  Filled 2016-08-18 (×3): qty 2

## 2016-08-18 MED ORDER — DM-GUAIFENESIN ER 30-600 MG PO TB12
1.0000 | ORAL_TABLET | Freq: Two times a day (BID) | ORAL | Status: DC
  Administered 2016-08-18 – 2016-08-19 (×5): 1 via ORAL
  Filled 2016-08-18 (×6): qty 1

## 2016-08-18 MED ORDER — IPRATROPIUM BROMIDE 0.02 % IN SOLN
0.5000 mg | RESPIRATORY_TRACT | Status: DC
  Administered 2016-08-18: 0.5 mg via RESPIRATORY_TRACT
  Filled 2016-08-18: qty 2.5

## 2016-08-18 MED ORDER — LEVALBUTEROL HCL 1.25 MG/0.5ML IN NEBU
1.2500 mg | INHALATION_SOLUTION | Freq: Four times a day (QID) | RESPIRATORY_TRACT | Status: DC
  Administered 2016-08-18 – 2016-09-07 (×80): 1.25 mg via RESPIRATORY_TRACT
  Filled 2016-08-18 (×91): qty 0.5

## 2016-08-18 MED ORDER — LORAZEPAM 1 MG PO TABS
1.0000 mg | ORAL_TABLET | Freq: Once | ORAL | Status: AC
  Administered 2016-08-18: 1 mg via ORAL
  Filled 2016-08-18: qty 1

## 2016-08-18 NOTE — H&P (Signed)
History and Physical    DELMONT PROSCH VHQ:[id number] DOB: [birth year] DOA: 08/17/2016  Referring MD/NP/PA:   PCP: Georgann Housekeeper, MD   Patient coming from:  The patient is coming from home.  At baseline, pt is independent for most of ADL.   Chief Complaint: Shortness of breath  HPI: Bradley [name] is a 55 y.o. male with medical history significant of COPD, asthma, pulmonary fibrosis, depression, anxiety, kidney stone, tobacco abuse, who presents with cough and shortness of breath.  Patient states tha he he has a cough and shortness of breath which has been progressively getting worse in the past several days. He coughs up brownish colored sputum. No chest pain, fever or chills. Patient does not have a runny nose also throat. He does not have nausea, vomiting, abdominal pain, symptoms of UTI or unilateral weakness. Of note, patient had right rotator cuff surgery on Monday. He has mild pain in right shoulder.  ED Course: pt was found to have WBC 13.0, electrolytes and renal function okay, and are 1.06, BNP 110.2, temperature normal, tachycardia, tachypnea, oxygen saturation 90% on room air. Chest x-ray showed increased interstitial obesity. CTA of chest showed: 1. no evidence of significant pulmonary embolus, 2. Diffuse bilateral regional ground-glass airspace opacification, with underlying emphysema and interstitial prominence. 3. Enlarged mediastinal nodes, measuring up to 1.9 cm in short axis. Some of these would be amenable to biopsy, as deemed clinically appropriate. 4. Scattered coronary artery calcifications seen. 5. Cholelithiasis. 6. Scattered bilateral renal cysts. Pt is admitted to SDU as inpt.  Review of Systems:   General: no fevers, chills, no changes in body weight, has poor appetite, has fatigue HEENT: no blurry vision, hearing changes or sore throat Respiratory: has dyspnea, coughing, wheezing CV: no chest pain, no palpitations GI: no nausea, vomiting, abdominal pain,  diarrhea, constipation GU: no dysuria, burning on urination, increased urinary frequency, hematuria  Ext: no leg edema. Has right shoulder pain. Neuro: no unilateral weakness, numbness, or tingling, no vision change or hearing loss Skin: no rash, no skin tear. MSK: No muscle spasm, no deformity, no limitation of range of movement in spin Heme: No easy bruising.  Travel history: No recent long distant travel.  Allergy:  Allergies  Allergen Reactions  . Wellbutrin [Bupropion] Cough    Past Medical History:  Diagnosis Date  . Anxiety    Citalopram controls "panic attacks"  . Asthma   . COPD (chronic obstructive pulmonary disease) (HCC)   . Dyspnea    with exertion  . History of kidney stones    x3-4 - lithotripsy  . Pneumonia    08/11/16- 3 years ago  . Pulmonary fibrosis (HCC)     Past Surgical History:  Procedure Laterality Date  . COLONOSCOPY W/ POLYPECTOMY    . LITHOTRIPSY    . MULTIPLE TOOTH EXTRACTIONS     19 teeth extracted at once  . SHOULDER ARTHROSCOPY WITH ROTATOR CUFF REPAIR Right 08/14/2016   Procedure: RIGHT SHOULDER ARTHROSCOPY,DEBRIDEMENT, OPEN ROTATOR CUFF REPAIR;  Surgeon: Eldred Manges, MD;  Location: [facility] OR;  Service: Orthopedics;  Laterality: Right;  . TONSILLECTOMY      Social History:  reports that he has been smoking Cigarettes.  He has a 30.00 pack-year smoking history. He has never used smokeless tobacco. He reports that he does not drink alcohol or use drugs.  Family History:  Family History  Problem Relation Age of Onset  . Heart disease Mother   . Colon cancer Mother  Prior to Admission medications   Medication Sig Start Date End Date Taking? Authorizing Provider  albuterol (PROVENTIL HFA;VENTOLIN HFA) 108 (90 BASE) MCG/ACT inhaler Inhale 1 puff into the lungs every 6 (six) hours as needed for wheezing or shortness of breath.   Yes Historical Provider, MD  citalopram (CELEXA) 20 MG tablet Take 30 mg by mouth every morning.    Yes  Historical Provider, MD  fexofenadine-pseudoephedrine (ALLEGRA-D) 60-120 MG 12 hr tablet Take 1 tablet by mouth every morning.    Yes Historical Provider, MD  Fluticasone-Salmeterol (ADVAIR) 250-50 MCG/DOSE AEPB Inhale 1 puff into the lungs 2 (two) times daily.   Yes Historical Provider, MD  ibuprofen (ADVIL,MOTRIN) 200 MG tablet Take 600 mg by mouth every 6 (six) hours as needed for mild pain.   Yes Historical Provider, MD  methocarbamol (ROBAXIN) 500 MG tablet Take 1 tablet (500 mg total) by mouth 4 (four) times daily. 08/15/16  Yes Eldred Manges, MD  oxyCODONE-acetaminophen (ROXICET) 5-325 MG tablet Take 1-2 tablets by mouth every 4 (four) hours as needed for severe pain. 08/15/16  Yes Eldred Manges, MD    Physical Exam: Vitals:   08/18/16 0630 08/18/16 0710 08/18/16 0730 08/18/16 0821  BP: 126/85 132/89 127/88 120/83  Pulse: 111 111 107   Resp: 21 (!) 27 (!) 36   Temp:    98.3 F (36.8 C)  TempSrc:    Axillary  SpO2: 100% 99% 93%   Weight:    89.9 kg (198 lb 3.1 oz)  Height:    5\' 9"  (1.753 m)   General: Not in acute distress HEENT:       Eyes: PERRL, EOMI, no scleral icterus.       ENT: No discharge from the ears and nose, no pharynx injection, no tonsillar enlargement.        Neck: No JVD, no bruit, no mass felt. Heme: No neck lymph node enlargement. Cardiac: S1/S2, RRR, No murmurs, No gallops or rubs. Respiratory: has wheezing bilaterally. No rales or rubs. GI: Soft, nondistended, nontender, no rebound pain, no organomegaly, BS present. GU: No hematuria Ext: No pitting leg edema bilaterally. 2+DP/PT pulse bilaterally. S/p of R shoulder surgery with mild tenderness. Musculoskeletal: No joint deformities, No joint redness or warmth, no limitation of ROM in spin. Skin: No rashes.  Neuro: Alert, oriented X3, cranial nerves II-XII grossly intact, moves all extremities normally.  Psych: Patient is not psychotic, no suicidal or hemocidal ideation.  Labs on Admission: I have  personally reviewed following labs and imaging studies  CBC:  Recent Labs Lab 08/14/16 1352 08/17/16 2217  WBC 9.5 13.0*  NEUTROABS  --  11.0*  HGB 15.8 13.6  HCT 46.3 39.0  MCV 90.6 88.8  PLT 216 186   Basic Metabolic Panel:  Recent Labs Lab 08/14/16 1352 08/17/16 2217  NA 137 134*  K 4.1 3.5  CL 103 101  CO2 25 23  GLUCOSE 92 151*  BUN 6 8  CREATININE 0.88 0.85  CALCIUM 9.5 8.4*   GFR: Estimated Creatinine Clearance: 108.9 mL/min (by C-G formula based on SCr of 0.85 mg/dL). Liver Function Tests:  Recent Labs Lab 08/14/16 1352  AST 21  ALT 15*  ALKPHOS 50  BILITOT 0.6  PROT 8.1  ALBUMIN 3.8   No results for input(s): LIPASE, AMYLASE in the last 168 hours. No results for input(s): AMMONIA in the last 168 hours. Coagulation Profile:  Recent Labs Lab 08/14/16 1352 08/18/16 0309  INR 1.06 1.20   Cardiac  Enzymes: No results for input(s): CKTOTAL, CKMB, CKMBINDEX, TROPONINI in the last 168 hours. BNP (last 3 results) No results for input(s): PROBNP in the last 8760 hours. HbA1C: No results for input(s): HGBA1C in the last 72 hours. CBG: No results for input(s): GLUCAP in the last 168 hours. Lipid Profile: No results for input(s): CHOL, HDL, LDLCALC, TRIG, CHOLHDL, LDLDIRECT in the last 72 hours. Thyroid Function Tests: No results for input(s): TSH, T4TOTAL, FREET4, T3FREE, THYROIDAB in the last 72 hours. Anemia Panel: No results for input(s): VITAMINB12, FOLATE, FERRITIN, TIBC, IRON, RETICCTPCT in the last 72 hours. Urine analysis: No results found for: COLORURINE, APPEARANCEUR, LABSPEC, PHURINE, GLUCOSEU, HGBUR, BILIRUBINUR, KETONESUR, PROTEINUR, UROBILINOGEN, NITRITE, LEUKOCYTESUR Sepsis Labs: @LABRCNTIP (procalcitonin:4,lacticidven:4) )No results found for this or any previous visit (from the past 240 hour(s)).   Radiological Exams on Admission: Ct Angio Chest Pe W Or Wo Contrast  Result Date: 08/18/2016 CLINICAL DATA:  Acute onset of  respiratory distress. Recent rotator cuff surgery. Initial encounter. EXAM: CT ANGIOGRAPHY CHEST WITH CONTRAST TECHNIQUE: Multidetector CT imaging of the chest was performed using the standard protocol during bolus administration of intravenous contrast. Multiplanar CT image reconstructions and MIPs were obtained to evaluate the vascular anatomy. CONTRAST:  100 mL of Isovue 370 IV contrast COMPARISON:  Chest radiograph performed earlier today at 10:23 p.m., and CT of the chest performed 07/14/2015 FINDINGS: Cardiovascular: There is no evidence of significant pulmonary embolus. Scattered coronary artery calcifications are seen. The heart remains normal in size. The thoracic aorta is grossly unremarkable. The great vessels are grossly unremarkable. Mediastinum/Nodes: Visualized mediastinal nodes measure up to 1.9 cm in short axis. Enlarged mediastinal nodes are noted at the subcarinal, aortopulmonary window, right paratracheal and periaortic regions. No pericardial effusion is identified. The thyroid gland is unremarkable. No axillary lymphadenopathy is seen. Lungs/Pleura: Diffuse bilateral regional ground-glass airspace opacification is noted, with underlying diffuse emphysema and interstitial prominence. This reflects markedly worsening bilateral airspace densities, superimposed on the patient's fibrotic change. A variety of etiologies of pneumonia or pneumonitis could have such an appearance. Atypical infection is a concern. No pleural effusion or pneumothorax is seen. Upper Abdomen: The visualized portions of the liver and spleen are grossly unremarkable. Small stones are suggested dependently within the gallbladder. The visualized portions of the pancreas, adrenal glands and kidneys are unremarkable, aside from small bilateral renal cysts, measuring up to 2.7 cm in size. Musculoskeletal: No acute osseous abnormalities are identified. The visualized musculature is unremarkable in appearance. Review of the MIP  images confirms the above findings. IMPRESSION: 1. No evidence of significant pulmonary embolus. 2. Diffuse bilateral regional ground-glass airspace opacification, with underlying emphysema and interstitial prominence. This is markedly worsened from 2016, reflecting acute airspace disease superimposed on the patient's fibrotic change. A variety of etiologies of pneumonia or pneumonitis could have such an appearance. Atypical infection is a concern. 3. Enlarged mediastinal nodes, measuring up to 1.9 cm in short axis. Some of these would be amenable to biopsy, as deemed clinically appropriate. 4. Scattered coronary artery calcifications seen. 5. Cholelithiasis. 6. Scattered bilateral renal cysts. Electronically Signed   By: Roanna Raider M.D.   On: 08/18/2016 00:58   Dg Chest Portable 1 View  Result Date: 08/17/2016 CLINICAL DATA:  Increasing shortness of breath this evening. History of COPD, fibrosis. EXAM: PORTABLE CHEST 1 VIEW COMPARISON:  Chest radiograph August 14, 2016 FINDINGS: Cardiomediastinal silhouette is normal. Increasing interstitial prominence and patchy mid and lower lung zone airspace opacities. No pleural effusion. No pneumothorax. Biapical pleural thickening.  RIGHT humeral head suture anchors. IMPRESSION: Increasing interstitial and to lesser extent alveolar airspace opacities concerning for pneumonia superimposed on a background of chronic fibrosis/COPD. Electronically Signed   By: Awilda Metroourtnay  Bloomer M.D.   On: 08/17/2016 23:09     EKG: Independently reviewed.  Sinus rhythm, tachycardia, QTC 480, no skin change  Assessment/Plan Principal Problem:   Acute respiratory failure (HCC) Active Problems:   Pulmonary fibrosis (HCC)   Cigarette smoker   Right rotator cuff tear   CAP (community acquired pneumonia)   COPD exacerbation (HCC)   Depression   Sepsis (HCC)   Acute respiratory failure and sepsis: Likely due to multifactorial etiology, including possible CAP, COPD  exacerbation, pulmonary fibrosis. Patient  Meets criteria for sepsis with leukocytosis, tachycardia, tachypnea. Currently hemodynamically stable  - Will admit to SDU as inpt.  - IV Rocephin and azithromycin - Mucinex for cough  -solumedrol 60mg  bid - prn Atrovent, Xopenex Neb prn for SOB - Urine legionella and S. pneumococcal antigen - Follow up blood culture x2, sputum culture and respiratory virus panel, plus Flu pcr - will get Procalcitonin and trend lactic acid level per sepsis protocol - IVF: 2L of NS bolus in ED, followed by 100mL per hour of NS   COPD exacerbation:  -see above  Tobacco abuse : -Did counseling about importance of quitting smoking -Nicotine patch  Depression and anxiety: Stable, no suicidal or homicidal ideations. -Continue home medications: Celexa  Right rotator cuff tear: s/p of surgery. Still has mild pain -pain control: roxicet, ibuprofen   DVT ppx: SQ Lovenox Code Status: Full code Family Communication: yes, patient's  Wife at bed side Disposition Plan:  Anticipate discharge back to previous home environment Consults called:  none Admission status: SDU/inpation       Date of Service 08/18/2016    Lorretta HarpIU, Adriann Thau [facility] Pager [phone]  If 7PM-7AM, please contact night-coverage [email or web address] Password [email or web address] 08/18/2016, 8:31 AM

## 2016-08-18 NOTE — ED Notes (Signed)
Attempted report X1

## 2016-08-18 NOTE — ED Notes (Signed)
Found pt standing up urinating without assistance, pt had removed O2 and sats were 60% on RA. Pt was placed back into bed and NRB placed on pt at 15L. Pt was encouraged to use call bell for assistance while urinating so O2 sats don't drop.

## 2016-08-18 NOTE — Progress Notes (Signed)
Progress Note    Bradley [name]  ZOX:[id number] DOB: Feb 11, 1961  DOA: 08/17/2016 PCP: Georgann HousekeeperHUSAIN,KARRAR, MD    Brief Narrative:   Chief complaint: Follow-up pneumonia  Bradley [name] is an 55 y.o. male with medical history significant of COPD, asthma, pulmonary fibrosis, depression, anxiety, kidney stone, tobacco abuse, who was admitted 08/17/16 for evaluation of cough and shortness of breath. He recently had right rotator cuff surgery as well. In the ED, WBC was 13, oxygen saturation was 90% on room air and the patient was tachycardic/tachypneic. Chest x-ray showed increased interstitial opacities. CT of the chest showed enlarged mediastinal lymph nodes and diffuse bilateral airspace opacification with underlying emphysema and interstitial prominence.   Assessment/Plan:   Principal Problem:   Acute respiratory failure/Sepsis (HCC) secondary to atypical pneumonia in the setting of pulmonary fibrosis and COPD with exacerbation The patient continues to have a high oxygen requirement and desaturates when he removes his nonrebreather mask. He has increased work of breathing. Continue Rocephin/azithromycin and increase Solu-Medrol to 60 mg IV every 6 hours. Until you nebulized bronchodilators. Respiratory final panel was negative. Follow-up blood cultures and sputum culture. Strep pneumonia antigen negative but Legionella is still pending.  Active Problems:   Cigarette smoker Tobacco cessation counseling per nursing staff.    Right rotator cuff tear Continue to immobilize with sling and provide pain medications as needed. Will add scheduled Motrin secondary to pain as well.    Depression Continue Celexa.  Family Communication/Anticipated D/C date and plan/Code Status   DVT prophylaxis: Lovenox ordered. Code Status: Full Code.  Family Communication: Wife at the bedside. Disposition Plan: Suspect he will need hospitalization for several more days given his respiratory  status.   Medical Consultants:    None.   Procedures:    None  Anti-Infectives:    Rocephin/azithromycin 08/17/16--->  Subjective:   The patient reports that that he is very short of breath with an occasional cough. Review of symptoms is negative for chest pain.  Objective:    Vitals:   08/18/16 0710 08/18/16 0730 08/18/16 0821 08/18/16 0953  BP: 132/89 127/88 120/83   Pulse: 111 107    Resp: (!) 27 (!) 36    Temp:   98.3 F (36.8 C)   TempSrc:   Axillary   SpO2: 99% 93%  90%  Weight:   89.9 kg (198 lb 3.1 oz)   Height:   5\' 9"  (1.753 m)     Intake/Output Summary (Last 24 hours) at 08/18/16 1229 Last data filed at 08/18/16 0510  Gross per 24 hour  Intake             2000 ml  Output             1925 ml  Net               75 ml   Filed Weights   08/18/16 0821  Weight: 89.9 kg (198 lb 3.1 oz)    Exam: General exam: Appears Mildly uncomfortablem Respiratory system: Diminished breath sounds with diffuse expiratory wheezing and increased work of breathing. Cardiovascular system: S1 & S2 heard, regular/tachycardic. No JVD,  rubs, gallops or clicks. No murmurs. Gastrointestinal system: Abdomen is nondistended, soft and nontender. No organomegaly or masses felt. Normal bowel sounds heard. Central nervous system: Alert and oriented. No focal neurological deficits. Extremities: No clubbing,  or cyanosis. No edema. Skin: No rashes, lesions or ulcers. Psychiatry: Judgement and insight appear normal. Mood & affect mildly anxious.  Data Reviewed:   I have personally reviewed following labs and imaging studies:  Labs: Basic Metabolic Panel:  Recent Labs Lab 08/14/16 1352 08/17/16 2217  NA 137 134*  K 4.1 3.5  CL 103 101  CO2 25 23  GLUCOSE 92 151*  BUN 6 8  CREATININE 0.88 0.85  CALCIUM 9.5 8.4*   GFR Estimated Creatinine Clearance: 108.9 mL/min (by C-G formula based on SCr of 0.85 mg/dL). Liver Function Tests:  Recent Labs Lab 08/14/16 1352   AST 21  ALT 15*  ALKPHOS 50  BILITOT 0.6  PROT 8.1  ALBUMIN 3.8   Coagulation profile  Recent Labs Lab 08/14/16 1352 08/18/16 0309  INR 1.06 1.20    CBC:  Recent Labs Lab 08/14/16 1352 08/17/16 2217  WBC 9.5 13.0*  NEUTROABS  --  11.0*  HGB 15.8 13.6  HCT 46.3 39.0  MCV 90.6 88.8  PLT 216 186   D-Dimer:  Recent Labs  08/17/16 2217  DDIMER 1.13*   Sepsis Labs:  Recent Labs Lab 08/14/16 1352 08/17/16 2217 08/18/16 0218 08/18/16 0309 08/18/16 0844  PROCALCITON  --   --   --  0.34  --   WBC 9.5 13.0*  --   --   --   LATICACIDVEN  --   --  1.6  --  2.4*    Microbiology Recent Results (from the past 240 hour(s))  MRSA PCR Screening     Status: None   Collection Time: 08/18/16  8:32 AM  Result Value Ref Range Status   MRSA by PCR NEGATIVE NEGATIVE Final    Comment:        The GeneXpert MRSA Assay (FDA approved for NASAL specimens only), is one component of a comprehensive MRSA colonization surveillance program. It is not intended to diagnose MRSA infection nor to guide or monitor treatment for MRSA infections.   Respiratory Panel by PCR     Status: None   Collection Time: 08/18/16  9:18 AM  Result Value Ref Range Status   Adenovirus NOT DETECTED NOT DETECTED Final   Coronavirus 229E NOT DETECTED NOT DETECTED Final   Coronavirus HKU1 NOT DETECTED NOT DETECTED Final   Coronavirus NL63 NOT DETECTED NOT DETECTED Final   Coronavirus OC43 NOT DETECTED NOT DETECTED Final   Metapneumovirus NOT DETECTED NOT DETECTED Final   Rhinovirus / Enterovirus NOT DETECTED NOT DETECTED Final   Influenza A NOT DETECTED NOT DETECTED Final   Influenza B NOT DETECTED NOT DETECTED Final   Parainfluenza Virus 1 NOT DETECTED NOT DETECTED Final   Parainfluenza Virus 2 NOT DETECTED NOT DETECTED Final   Parainfluenza Virus 3 NOT DETECTED NOT DETECTED Final   Parainfluenza Virus 4 NOT DETECTED NOT DETECTED Final   Respiratory Syncytial Virus NOT DETECTED NOT DETECTED  Final   Bordetella pertussis NOT DETECTED NOT DETECTED Final   Chlamydophila pneumoniae NOT DETECTED NOT DETECTED Final   Mycoplasma pneumoniae NOT DETECTED NOT DETECTED Final    Radiology: Ct Angio Chest Pe W Or Wo Contrast  Result Date: 08/18/2016 CLINICAL DATA:  Acute onset of respiratory distress. Recent rotator cuff surgery. Initial encounter. EXAM: CT ANGIOGRAPHY CHEST WITH CONTRAST TECHNIQUE: Multidetector CT imaging of the chest was performed using the standard protocol during bolus administration of intravenous contrast. Multiplanar CT image reconstructions and MIPs were obtained to evaluate the vascular anatomy. CONTRAST:  100 mL of Isovue 370 IV contrast COMPARISON:  Chest radiograph performed earlier today at 10:23 p.m., and CT of the chest performed 07/14/2015 FINDINGS: Cardiovascular: There  is no evidence of significant pulmonary embolus. Scattered coronary artery calcifications are seen. The heart remains normal in size. The thoracic aorta is grossly unremarkable. The great vessels are grossly unremarkable. Mediastinum/Nodes: Visualized mediastinal nodes measure up to 1.9 cm in short axis. Enlarged mediastinal nodes are noted at the subcarinal, aortopulmonary window, right paratracheal and periaortic regions. No pericardial effusion is identified. The thyroid gland is unremarkable. No axillary lymphadenopathy is seen. Lungs/Pleura: Diffuse bilateral regional ground-glass airspace opacification is noted, with underlying diffuse emphysema and interstitial prominence. This reflects markedly worsening bilateral airspace densities, superimposed on the patient's fibrotic change. A variety of etiologies of pneumonia or pneumonitis could have such an appearance. Atypical infection is a concern. No pleural effusion or pneumothorax is seen. Upper Abdomen: The visualized portions of the liver and spleen are grossly unremarkable. Small stones are suggested dependently within the gallbladder. The  visualized portions of the pancreas, adrenal glands and kidneys are unremarkable, aside from small bilateral renal cysts, measuring up to 2.7 cm in size. Musculoskeletal: No acute osseous abnormalities are identified. The visualized musculature is unremarkable in appearance. Review of the MIP images confirms the above findings. IMPRESSION: 1. No evidence of significant pulmonary embolus. 2. Diffuse bilateral regional ground-glass airspace opacification, with underlying emphysema and interstitial prominence. This is markedly worsened from 2016, reflecting acute airspace disease superimposed on the patient's fibrotic change. A variety of etiologies of pneumonia or pneumonitis could have such an appearance. Atypical infection is a concern. 3. Enlarged mediastinal nodes, measuring up to 1.9 cm in short axis. Some of these would be amenable to biopsy, as deemed clinically appropriate. 4. Scattered coronary artery calcifications seen. 5. Cholelithiasis. 6. Scattered bilateral renal cysts. Electronically Signed   By: Roanna Raider M.D.   On: 08/18/2016 00:58   Dg Chest Portable 1 View  Result Date: 08/17/2016 CLINICAL DATA:  Increasing shortness of breath this evening. History of COPD, fibrosis. EXAM: PORTABLE CHEST 1 VIEW COMPARISON:  Chest radiograph August 14, 2016 FINDINGS: Cardiomediastinal silhouette is normal. Increasing interstitial prominence and patchy mid and lower lung zone airspace opacities. No pleural effusion. No pneumothorax. Biapical pleural thickening. RIGHT humeral head suture anchors. IMPRESSION: Increasing interstitial and to lesser extent alveolar airspace opacities concerning for pneumonia superimposed on a background of chronic fibrosis/COPD. Electronically Signed   By: Awilda Metro M.D.   On: 08/17/2016 23:09    Medications:   . [START ON 08/19/2016] azithromycin  500 mg Oral Q24H  . [START ON 08/19/2016] cefTRIAXone (ROCEPHIN)  IV  1 g Intravenous Q24H  . citalopram  30 mg Oral  q morning - 10a  . dextromethorphan-guaiFENesin  1 tablet Oral BID  . enoxaparin (LOVENOX) injection  40 mg Subcutaneous Q24H  . ipratropium  0.5 mg Nebulization Q6H  . levalbuterol  1.25 mg Nebulization Q6H  . loratadine  10 mg Oral Daily  . methocarbamol  500 mg Oral QID  . methylPREDNISolone (SOLU-MEDROL) injection  60 mg Intravenous Q12H  . nicotine  21 mg Transdermal Daily   Continuous Infusions: . sodium chloride 100 mL/hr at 08/18/16 0244    Medical decision making is of high complexity and this patient is at high risk of deterioration, therefore this is a level 3 visit.     LOS: 0 days   Kilian Schwartz  [facility] Pager [phone]. If unable to reach me by pager, please call my cell phone at [phone].  *Please refer to [email or web address], password [email or web address] to get updated schedule on who will round on this patient, as hospitalists  switch teams weekly. If 7PM-7AM, please contact night-coverage at [email or web address], password [email or web address] for any overnight needs.  08/18/2016, 12:29 PM

## 2016-08-19 ENCOUNTER — Inpatient Hospital Stay (HOSPITAL_COMMUNITY): Payer: BLUE CROSS/BLUE SHIELD

## 2016-08-19 DIAGNOSIS — J9601 Acute respiratory failure with hypoxia: Secondary | ICD-10-CM

## 2016-08-19 DIAGNOSIS — J9600 Acute respiratory failure, unspecified whether with hypoxia or hypercapnia: Secondary | ICD-10-CM

## 2016-08-19 DIAGNOSIS — J80 Acute respiratory distress syndrome: Secondary | ICD-10-CM

## 2016-08-19 DIAGNOSIS — J189 Pneumonia, unspecified organism: Secondary | ICD-10-CM

## 2016-08-19 LAB — BLOOD GAS, ARTERIAL
ACID-BASE DEFICIT: 0.8 mmol/L (ref 0.0–2.0)
BICARBONATE: 23.1 mmol/L (ref 20.0–28.0)
Drawn by: 41977
O2 SAT: 88.3 %
PCO2 ART: 35.7 mmHg (ref 32.0–48.0)
PH ART: 7.425 (ref 7.350–7.450)
PO2 ART: 56.8 mmHg — AB (ref 83.0–108.0)
Patient temperature: 98.6

## 2016-08-19 LAB — ECHOCARDIOGRAM COMPLETE
CHL CUP TV REG PEAK VELOCITY: 343 cm/s
FS: 33 % (ref 28–44)
Height: 69 in
IV/PV OW: 1.01
LA diam index: 1.7 cm/m2
LA vol: 42.3 mL
LASIZE: 35 mm
LAVOLA4C: 37.3 mL
LAVOLIN: 20.5 mL/m2
LDCA: 2.84 cm2
LEFT ATRIUM END SYS DIAM: 35 mm
LVOTD: 19 mm
PW: 8.95 mm — AB (ref 0.6–1.1)
RV sys press: 50 mmHg
TRMAXVEL: 343 cm/s
WEIGHTICAEL: 3171.1 [oz_av]

## 2016-08-19 LAB — BASIC METABOLIC PANEL
ANION GAP: 8 (ref 5–15)
BUN: 15 mg/dL (ref 6–20)
CO2: 22 mmol/L (ref 22–32)
Calcium: 8.3 mg/dL — ABNORMAL LOW (ref 8.9–10.3)
Chloride: 107 mmol/L (ref 101–111)
Creatinine, Ser: 0.72 mg/dL (ref 0.61–1.24)
GFR calc Af Amer: >60 mL/min (ref >60)
GLUCOSE: 144 mg/dL — AB (ref 65–99)
POTASSIUM: 3.7 mmol/L (ref 3.5–5.1)
Sodium: 137 mmol/L (ref 135–145)

## 2016-08-19 LAB — GLUCOSE, CAPILLARY
Glucose-Capillary: 167 mg/dL — ABNORMAL HIGH (ref 65–99)
Glucose-Capillary: 153 mg/dL — ABNORMAL HIGH (ref 65–99)
Glucose-Capillary: 143 mg/dL — ABNORMAL HIGH (ref 65–99)

## 2016-08-19 LAB — CBC
HEMATOCRIT: 40.4 % (ref 39.0–52.0)
HEMOGLOBIN: 13.6 g/dL (ref 13.0–17.0)
MCH: 30.4 pg (ref 26.0–34.0)
MCHC: 33.7 g/dL (ref 30.0–36.0)
MCV: 90.4 fL (ref 78.0–100.0)
Platelets: 216 10*3/uL (ref 150–400)
RBC: 4.47 MIL/uL (ref 4.22–5.81)
RDW: 13.4 % (ref 11.5–15.5)
WBC: 13.6 10*3/uL — ABNORMAL HIGH (ref 4.0–10.5)

## 2016-08-19 LAB — TROPONIN I: Troponin I: <0.03 ng/mL (ref <0.03)

## 2016-08-19 LAB — LACTIC ACID, PLASMA: Lactic Acid, Venous: 1.8 mmol/L (ref 0.5–1.9)

## 2016-08-19 MED ORDER — ARTIFICIAL TEARS OP OINT
1.0000 "application " | TOPICAL_OINTMENT | Freq: Three times a day (TID) | OPHTHALMIC | Status: DC
  Administered 2016-08-20 – 2016-08-22 (×7): 1 via OPHTHALMIC
  Filled 2016-08-19: qty 3.5

## 2016-08-19 MED ORDER — FENTANYL CITRATE (PF) 100 MCG/2ML IJ SOLN
INTRAMUSCULAR | Status: AC
  Administered 2016-08-19: 50 ug
  Filled 2016-08-19: qty 4

## 2016-08-19 MED ORDER — CHLORHEXIDINE GLUCONATE 0.12% ORAL RINSE (MEDLINE KIT)
15.0000 mL | Freq: Two times a day (BID) | OROMUCOSAL | Status: DC
  Administered 2016-08-20 – 2016-08-31 (×23): 15 mL via OROMUCOSAL

## 2016-08-19 MED ORDER — CISATRACURIUM BESYLATE (PF) 200 MG/20ML IV SOLN
3.0000 ug/kg/min | INTRAVENOUS | Status: DC
  Administered 2016-08-20: 3 ug/kg/min via INTRAVENOUS
  Administered 2016-08-20: 2 ug/kg/min via INTRAVENOUS
  Administered 2016-08-21: 1.5 ug/kg/min via INTRAVENOUS
  Administered 2016-08-22: 2 ug/kg/min via INTRAVENOUS
  Filled 2016-08-19 (×4): qty 20

## 2016-08-19 MED ORDER — PIPERACILLIN-TAZOBACTAM 3.375 G IVPB 30 MIN
3.3750 g | INTRAVENOUS | Status: AC
  Administered 2016-08-19: 3.375 g via INTRAVENOUS
  Filled 2016-08-19: qty 50

## 2016-08-19 MED ORDER — MIDAZOLAM HCL 2 MG/2ML IJ SOLN
2.0000 mg | Freq: Once | INTRAMUSCULAR | Status: AC | PRN
  Administered 2016-08-19: 1 mg via INTRAVENOUS

## 2016-08-19 MED ORDER — ORAL CARE MOUTH RINSE
15.0000 mL | Freq: Four times a day (QID) | OROMUCOSAL | Status: DC
  Administered 2016-08-20 – 2016-08-31 (×45): 15 mL via OROMUCOSAL

## 2016-08-19 MED ORDER — MIDAZOLAM BOLUS VIA INFUSION
2.0000 mg | INTRAVENOUS | Status: DC | PRN
  Administered 2016-08-20 – 2016-08-23 (×7): 2 mg via INTRAVENOUS
  Filled 2016-08-19: qty 2

## 2016-08-19 MED ORDER — MIDAZOLAM HCL 2 MG/2ML IJ SOLN
INTRAMUSCULAR | Status: AC
  Administered 2016-08-19: 1 mg
  Filled 2016-08-19: qty 4

## 2016-08-19 MED ORDER — MIDAZOLAM HCL 2 MG/2ML IJ SOLN: 2.0000 mg | INTRAMUSCULAR | Status: DC | PRN

## 2016-08-19 MED ORDER — FENTANYL BOLUS VIA INFUSION
50.0000 ug | INTRAVENOUS | Status: DC | PRN
  Administered 2016-08-20 – 2016-08-22 (×3): 50 ug via INTRAVENOUS
  Filled 2016-08-19: qty 50

## 2016-08-19 MED ORDER — VANCOMYCIN HCL IN DEXTROSE 1-5 GM/200ML-% IV SOLN
1000.0000 mg | Freq: Three times a day (TID) | INTRAVENOUS | Status: DC
  Administered 2016-08-19 – 2016-08-23 (×12): 1000 mg via INTRAVENOUS
  Filled 2016-08-19 (×15): qty 200

## 2016-08-19 MED ORDER — FENTANYL 2500MCG IN NS 250ML (10MCG/ML) PREMIX INFUSION
25.0000 ug/h | INTRAVENOUS | Status: DC
  Administered 2016-08-20: 50 ug/h via INTRAVENOUS
  Administered 2016-08-20: 300 ug/h via INTRAVENOUS
  Administered 2016-08-20 (×2): 400 ug/h via INTRAVENOUS
  Administered 2016-08-21: 275 ug/h via INTRAVENOUS
  Administered 2016-08-21: 300 ug/h via INTRAVENOUS
  Administered 2016-08-21 – 2016-08-22 (×2): 250 ug/h via INTRAVENOUS
  Administered 2016-08-23 (×2): 325 ug/h via INTRAVENOUS
  Administered 2016-08-24 (×2): 200 ug/h via INTRAVENOUS
  Administered 2016-08-25: 150 ug/h via INTRAVENOUS
  Administered 2016-08-25: 200 ug/h via INTRAVENOUS
  Administered 2016-08-26: 150 ug/h via INTRAVENOUS
  Administered 2016-08-26 – 2016-08-27 (×2): 200 ug/h via INTRAVENOUS
  Administered 2016-08-28: 150 ug/h via INTRAVENOUS
  Administered 2016-08-29: 100 ug/h via INTRAVENOUS
  Filled 2016-08-19 (×22): qty 250

## 2016-08-19 MED ORDER — SODIUM CHLORIDE 0.9 % IV SOLN
2.0000 mg/h | INTRAVENOUS | Status: DC
  Administered 2016-08-20: 2 mg/h via INTRAVENOUS
  Administered 2016-08-20 (×2): 10 mg/h via INTRAVENOUS
  Administered 2016-08-20: 8 mg/h via INTRAVENOUS
  Administered 2016-08-20: 10 mg/h via INTRAVENOUS
  Administered 2016-08-21 – 2016-08-22 (×6): 8 mg/h via INTRAVENOUS
  Administered 2016-08-22: 6 mg/h via INTRAVENOUS
  Administered 2016-08-23: 8 mg/h via INTRAVENOUS
  Administered 2016-08-23: 3 mg/h via INTRAVENOUS
  Administered 2016-08-24: 4 mg/h via INTRAVENOUS
  Filled 2016-08-19 (×16): qty 10

## 2016-08-19 MED ORDER — FENTANYL CITRATE (PF) 100 MCG/2ML IJ SOLN
INTRAMUSCULAR | Status: AC
  Administered 2016-08-19: 100 ug
  Filled 2016-08-19: qty 2

## 2016-08-19 MED ORDER — DOCUSATE SODIUM 50 MG/5ML PO LIQD: 100.0000 mg | Freq: Two times a day (BID) | ORAL | Status: DC | PRN

## 2016-08-19 MED ORDER — MIDAZOLAM HCL 2 MG/2ML IJ SOLN
2.0000 mg | Freq: Once | INTRAMUSCULAR | Status: AC
  Administered 2016-08-19: 2 mg via INTRAVENOUS

## 2016-08-19 MED ORDER — FUROSEMIDE 10 MG/ML IJ SOLN
INTRAMUSCULAR | Status: AC
  Administered 2016-08-19: 11:00:00
  Filled 2016-08-19: qty 4

## 2016-08-19 MED ORDER — FUROSEMIDE 10 MG/ML IJ SOLN
40.0000 mg | Freq: Two times a day (BID) | INTRAMUSCULAR | Status: DC
  Administered 2016-08-19 – 2016-08-20 (×4): 40 mg via INTRAVENOUS
  Filled 2016-08-19 (×4): qty 4

## 2016-08-19 MED ORDER — FENTANYL CITRATE (PF) 100 MCG/2ML IJ SOLN
100.0000 ug | Freq: Once | INTRAMUSCULAR | Status: AC | PRN
  Administered 2016-08-19: 50 ug via INTRAVENOUS

## 2016-08-19 MED ORDER — CISATRACURIUM BOLUS VIA INFUSION
0.0500 mg/kg | Freq: Once | INTRAVENOUS | Status: DC
  Filled 2016-08-19: qty 5

## 2016-08-19 MED ORDER — FENTANYL CITRATE (PF) 100 MCG/2ML IJ SOLN: 50.0000 ug | Freq: Once | INTRAMUSCULAR | Status: DC

## 2016-08-19 MED ORDER — PIPERACILLIN-TAZOBACTAM 3.375 G IVPB
3.3750 g | Freq: Three times a day (TID) | INTRAVENOUS | Status: AC
  Administered 2016-08-19 – 2016-08-25 (×20): 3.375 g via INTRAVENOUS
  Filled 2016-08-19 (×21): qty 50

## 2016-08-19 MED ORDER — MIDAZOLAM HCL 2 MG/2ML IJ SOLN
INTRAMUSCULAR | Status: AC
  Filled 2016-08-19: qty 2

## 2016-08-19 NOTE — Progress Notes (Signed)
  Echocardiogram 2D Echocardiogram has been performed.  Bradley [name], Bradley [name] 08/19/2016, 4:36 PM

## 2016-08-19 NOTE — Significant Event (Signed)
Rapid Response Event Note  Overview: Time Called: 1035 Arrival Time: 1050 Event Type: Respiratory  Initial Focused Assessment: PP called for triage, patient had ICU orders.  I spoke with RN and RT, patient has required BiPAP and does not tolerate coming off the BiPAP, patient has significant pulmonary history and was seen by CCM.  Per CCM will transfer to ICU. Patient is at a high risk for needing to be intubated due to his respiratory disease, so ICU transfer is needed.  Interventions: -- ICU bed was requested, RN and RT informed, will try to help transfer if possible  Plan of Care (if not transferred): -- Tx to 2M03  Event Summary: Outcome: Transferred (Comment)  Event End Time: 1130  Meggie Laseter R

## 2016-08-19 NOTE — Progress Notes (Signed)
Patient is transferred to 92M Room 3.  Report given to SelzMelinda, Charity fundraiserN.  Family at bedside during this transfer.  Patient is able to ambulate with assist and rest in between.  Patient is on BiPap during this transfer.

## 2016-08-19 NOTE — Progress Notes (Signed)
Pt transported to 65M via bipap w/ no apparent complications.  Unit RT aware.

## 2016-08-19 NOTE — Progress Notes (Signed)
Progress Note    Bradley [name]  ZOX:[id number] DOB: [birth year]  DOA: 08/17/2016 PCP: Georgann Housekeeper, MD    Brief Narrative:   Chief complaint: Follow-up pneumonia  Bradley [name] is an 55 y.o. male with medical history significant of COPD, asthma, pulmonary fibrosis, depression, anxiety, kidney stone, tobacco abuse, who was admitted 08/17/16 for evaluation of cough and shortness of breath. He recently had right rotator cuff surgery as well. In the ED, WBC was 13, oxygen saturation was 90% on room air and the patient was tachycardic/tachypneic. Chest x-ray showed increased interstitial opacities. CT of the chest showed enlarged mediastinal lymph nodes and diffuse bilateral airspace opacification with underlying emphysema and interstitial prominence.   Assessment/Plan:   Principal Problem:   Acute respiratory failure/Sepsis (HCC) secondary to atypical pneumonia in the setting of pulmonary fibrosis and COPD with exacerbation The patient continues to have a high oxygen requirement and desaturates when he removes his nonrebreather mask. He has increased work of breathing. Continue Rocephin/azithromycin and increase Solu-Medrol to 60 mg IV every 6 hours. Continue nebulized bronchodilators. Respiratory virus panel was negative. Follow-up blood cultures and sputum culture. Strep pneumonia antigen negative but Legionella is still pending. Case discussed with Dr. Marchelle Gearing, who will take the patient onto his service.  Active Problems:   Cigarette smoker Tobacco cessation counseling per nursing staff.    Right rotator cuff tear Continue to immobilize with sling and provide pain medications as needed. Will add scheduled Motrin secondary to pain as well.    Depression Continue Celexa.  Family Communication/Anticipated D/C date and plan/Code Status   DVT prophylaxis: Lovenox ordered. Code Status: Full Code.  Family Communication: Wife at the bedside. Disposition Plan: Suspect he will need  hospitalization for several more days given his respiratory status.   Medical Consultants:    Pulmonology   Procedures:    None  Anti-Infectives:    Rocephin/azithromycin 08/17/16--->  Subjective:   The patient reports that that he is very short of breath with an occasional cough, no improvement overnight and is now on BiPAP. Review of symptoms is negative for chest pain.  Objective:    Vitals:   08/19/16 0118 08/19/16 0145 08/19/16 0259 08/19/16 0427  BP:    118/86  Pulse:    (!) 102  Resp:    (!) 27  Temp:  (!) 96.8 F (36 C) 97.1 F (36.2 C) 97.1 F (36.2 C)  TempSrc:  Axillary Axillary Oral  SpO2: 91%   95%  Weight:      Height:        Intake/Output Summary (Last 24 hours) at 08/19/16 0848 Last data filed at 08/19/16 0600  Gross per 24 hour  Intake             2250 ml  Output              925 ml  Net             1325 ml   Filed Weights   08/18/16 0821  Weight: 89.9 kg (198 lb 3.1 oz)    Exam: General exam: Appears uncomfortable and dyspneic with tripoding Respiratory system: Diminished breath sounds with diffuse expiratory wheezing and increased work of breathing. Cardiovascular system: S1 & S2 heard, regular/tachycardic. No JVD,  rubs, gallops or clicks. No murmurs. Gastrointestinal system: Abdomen is nondistended, soft and nontender. No organomegaly or masses felt. Normal bowel sounds heard. Central nervous system: Alert and oriented. No focal neurological deficits. Extremities: No clubbing,  or  cyanosis. No edema. Skin: No rashes, lesions or ulcers. Psychiatry: Judgement and insight appear normal. Mood & affect mildly anxious.   Data Reviewed:   I have personally reviewed following labs and imaging studies:  Labs: Basic Metabolic Panel:  Recent Labs Lab 08/14/16 1352 08/17/16 2217 08/19/16 0221  NA 137 134* 137  K 4.1 3.5 3.7  CL 103 101 107  CO2 25 23 22   GLUCOSE 92 151* 144*  BUN 6 8 15   CREATININE 0.88 0.85 0.72  CALCIUM 9.5  8.4* 8.3*   GFR Estimated Creatinine Clearance: 115.7 mL/min (by C-G formula based on SCr of 0.72 mg/dL). Liver Function Tests:  Recent Labs Lab 08/14/16 1352  AST 21  ALT 15*  ALKPHOS 50  BILITOT 0.6  PROT 8.1  ALBUMIN 3.8   Coagulation profile  Recent Labs Lab 08/14/16 1352 08/18/16 0309  INR 1.06 1.20    CBC:  Recent Labs Lab 08/14/16 1352 08/17/16 2217 08/19/16 0221  WBC 9.5 13.0* 13.6*  NEUTROABS  --  11.0*  --   HGB 15.8 13.6 13.6  HCT 46.3 39.0 40.4  MCV 90.6 88.8 90.4  PLT 216 186 216   D-Dimer:  Recent Labs  08/17/16 2217  DDIMER 1.13*   Sepsis Labs:  Recent Labs Lab 08/14/16 1352 08/17/16 2217 08/18/16 0218 08/18/16 0309 08/18/16 0844 08/19/16 0221  PROCALCITON  --   --   --  0.34  --   --   WBC 9.5 13.0*  --   --   --  13.6*  LATICACIDVEN  --   --  1.6  --  2.4*  --     Microbiology Recent Results (from the past 240 hour(s))  MRSA PCR Screening     Status: None   Collection Time: 08/18/16  8:32 AM  Result Value Ref Range Status   MRSA by PCR NEGATIVE NEGATIVE Final    Comment:        The GeneXpert MRSA Assay (FDA approved for NASAL specimens only), is one component of a comprehensive MRSA colonization surveillance program. It is not intended to diagnose MRSA infection nor to guide or monitor treatment for MRSA infections.   Respiratory Panel by PCR     Status: None   Collection Time: 08/18/16  9:18 AM  Result Value Ref Range Status   Adenovirus NOT DETECTED NOT DETECTED Final   Coronavirus 229E NOT DETECTED NOT DETECTED Final   Coronavirus HKU1 NOT DETECTED NOT DETECTED Final   Coronavirus NL63 NOT DETECTED NOT DETECTED Final   Coronavirus OC43 NOT DETECTED NOT DETECTED Final   Metapneumovirus NOT DETECTED NOT DETECTED Final   Rhinovirus / Enterovirus NOT DETECTED NOT DETECTED Final   Influenza A NOT DETECTED NOT DETECTED Final   Influenza B NOT DETECTED NOT DETECTED Final   Parainfluenza Virus 1 NOT DETECTED NOT  DETECTED Final   Parainfluenza Virus 2 NOT DETECTED NOT DETECTED Final   Parainfluenza Virus 3 NOT DETECTED NOT DETECTED Final   Parainfluenza Virus 4 NOT DETECTED NOT DETECTED Final   Respiratory Syncytial Virus NOT DETECTED NOT DETECTED Final   Bordetella pertussis NOT DETECTED NOT DETECTED Final   Chlamydophila pneumoniae NOT DETECTED NOT DETECTED Final   Mycoplasma pneumoniae NOT DETECTED NOT DETECTED Final    Radiology: Ct Angio Chest Pe W Or Wo Contrast  Result Date: 08/18/2016 CLINICAL DATA:  Acute onset of respiratory distress. Recent rotator cuff surgery. Initial encounter. EXAM: CT ANGIOGRAPHY CHEST WITH CONTRAST TECHNIQUE: Multidetector CT imaging of the chest was performed using  the standard protocol during bolus administration of intravenous contrast. Multiplanar CT image reconstructions and MIPs were obtained to evaluate the vascular anatomy. CONTRAST:  100 mL of Isovue 370 IV contrast COMPARISON:  Chest radiograph performed earlier today at 10:23 p.m., and CT of the chest performed 07/14/2015 FINDINGS: Cardiovascular: There is no evidence of significant pulmonary embolus. Scattered coronary artery calcifications are seen. The heart remains normal in size. The thoracic aorta is grossly unremarkable. The great vessels are grossly unremarkable. Mediastinum/Nodes: Visualized mediastinal nodes measure up to 1.9 cm in short axis. Enlarged mediastinal nodes are noted at the subcarinal, aortopulmonary window, right paratracheal and periaortic regions. No pericardial effusion is identified. The thyroid gland is unremarkable. No axillary lymphadenopathy is seen. Lungs/Pleura: Diffuse bilateral regional ground-glass airspace opacification is noted, with underlying diffuse emphysema and interstitial prominence. This reflects markedly worsening bilateral airspace densities, superimposed on the patient's fibrotic change. A variety of etiologies of pneumonia or pneumonitis could have such an  appearance. Atypical infection is a concern. No pleural effusion or pneumothorax is seen. Upper Abdomen: The visualized portions of the liver and spleen are grossly unremarkable. Small stones are suggested dependently within the gallbladder. The visualized portions of the pancreas, adrenal glands and kidneys are unremarkable, aside from small bilateral renal cysts, measuring up to 2.7 cm in size. Musculoskeletal: No acute osseous abnormalities are identified. The visualized musculature is unremarkable in appearance. Review of the MIP images confirms the above findings. IMPRESSION: 1. No evidence of significant pulmonary embolus. 2. Diffuse bilateral regional ground-glass airspace opacification, with underlying emphysema and interstitial prominence. This is markedly worsened from 2016, reflecting acute airspace disease superimposed on the patient's fibrotic change. A variety of etiologies of pneumonia or pneumonitis could have such an appearance. Atypical infection is a concern. 3. Enlarged mediastinal nodes, measuring up to 1.9 cm in short axis. Some of these would be amenable to biopsy, as deemed clinically appropriate. 4. Scattered coronary artery calcifications seen. 5. Cholelithiasis. 6. Scattered bilateral renal cysts. Electronically Signed   By: Roanna Raider M.D.   On: 08/18/2016 00:58   Dg Chest Portable 1 View  Result Date: 08/17/2016 CLINICAL DATA:  Increasing shortness of breath this evening. History of COPD, fibrosis. EXAM: PORTABLE CHEST 1 VIEW COMPARISON:  Chest radiograph August 14, 2016 FINDINGS: Cardiomediastinal silhouette is normal. Increasing interstitial prominence and patchy mid and lower lung zone airspace opacities. No pleural effusion. No pneumothorax. Biapical pleural thickening. RIGHT humeral head suture anchors. IMPRESSION: Increasing interstitial and to lesser extent alveolar airspace opacities concerning for pneumonia superimposed on a background of chronic fibrosis/COPD.  Electronically Signed   By: Awilda Metro M.D.   On: 08/17/2016 23:09    Medications:   . azithromycin  500 mg Oral Q24H  . cefTRIAXone (ROCEPHIN)  IV  1 g Intravenous Q24H  . citalopram  30 mg Oral q morning - 10a  . dextromethorphan-guaiFENesin  1 tablet Oral BID  . enoxaparin (LOVENOX) injection  40 mg Subcutaneous Q24H  . ibuprofen  800 mg Oral TID  . ipratropium  0.5 mg Nebulization Q6H  . levalbuterol  1.25 mg Nebulization Q6H  . loratadine  10 mg Oral Daily  . methocarbamol  500 mg Oral QID  . methylPREDNISolone (SOLU-MEDROL) injection  60 mg Intravenous Q6H  . nicotine  21 mg Transdermal Daily   Continuous Infusions: . sodium chloride 100 mL/hr at 08/19/16 1610    Medical decision making is of high complexity and this patient is at high risk of deterioration, therefore this is  a level 3 visit.     LOS: 1 day   Kashauna Celmer  [facility] Pager [phone]. If unable to reach me by pager, please call my cell phone at [phone].  *Please refer to [email or web address], password [email or web address] to get updated schedule on who will round on this patient, as hospitalists switch teams weekly. If 7PM-7AM, please contact night-coverage at [email or web address], password [email or web address] for any overnight needs.  08/19/2016, 8:48 AM

## 2016-08-19 NOTE — Consult Note (Signed)
PULMONARY / CRITICAL CARE MEDICINE   Name: Bradley [name] MRN: [id number] DOB: 10/16/1961    ADMISSION DATE:  08/17/2016 CONSULTATION DATE:  08/19/16  REFERRING MD:  Dr Salome Arnt  CHIEF COMPLAINT:  Acute resp failure with hypoxemia  HISTORY OF PRESENT ILLNESS:    55 year old smoker, with PFT nov 2016 showing isolated reduction in dlco to 59% and quite functional and not on home o2. Followed by Dr Sherene Sires -0 last seen SEpt/Nov 2016 based on CT chest evidence showing diffuse changes of centrilobular and paraseptal emphysema along with prominent interstitial markings primarily in the upper lobes consistent with pulmonary fibrosis. This was a non--UIP pattern. Patient recommended quitting smoking because of concerns of DIP. According to the patient and his wife has continued to smoke. He has been in his baseline status of health and very functional. Then on 08/14/2016 he had a chest x-ray preoperative that only showed chronic changes without change. for a right shoulder surgery rotator cuff tear. The procedure was uneventful. Postoperative course was uneventful. There was no vomiting. Then abruptly on 08/16/2016 he started getting acutely ill with shortness of breath and cough with brown colored sputum. Then admitted to the hospital. Initial white count was 13,000. BNP was normal. CT angiogram chest ruled out pulmonary embolism but showed diffuse ground glass opacities and enlarged mediastinal node measuring close to 2 cm. He was started on community acquired pneumonia antibiotics but is developed progressive acute respiratory failure necessitating Solu-Medrol since 08/18/2016 evening. And from the early hours of 08/19/2016 he's been on BiPAP with significant respiratory distress and therefore pulmonary critical care has been consulted  At baseline he denies any mold exposure or mildew exposure or working with dust. He denies any birds in the house. He continues to smoke. He has been afebrile in the  hospital   Eventse 08/17/2016 - admit 08/18/2016-respiratory virus panel, HIV and flu panel PCR negative 08/19/16 -bipap, move to icu   PAST MEDICAL HISTORY :  He  has a past medical history of Anxiety; Asthma; COPD (chronic obstructive pulmonary disease) (HCC); Dyspnea; History of kidney stones; Pneumonia; and Pulmonary fibrosis (HCC).  PAST SURGICAL HISTORY: He  has a past surgical history that includes Multiple tooth extractions; Tonsillectomy; Lithotripsy; Colonoscopy w/ polypectomy; and Shoulder arthroscopy with rotator cuff repair (Right, 08/14/2016).  Allergies  Allergen Reactions  . Wellbutrin [Bupropion] Cough    No current facility-administered medications on file prior to encounter.    Current Outpatient Prescriptions on File Prior to Encounter  Medication Sig  . albuterol (PROVENTIL HFA;VENTOLIN HFA) 108 (90 BASE) MCG/ACT inhaler Inhale 1 puff into the lungs every 6 (six) hours as needed for wheezing or shortness of breath.  . citalopram (CELEXA) 20 MG tablet Take 30 mg by mouth every morning.   . fexofenadine-pseudoephedrine (ALLEGRA-D) 60-120 MG 12 hr tablet Take 1 tablet by mouth every morning.   . Fluticasone-Salmeterol (ADVAIR) 250-50 MCG/DOSE AEPB Inhale 1 puff into the lungs 2 (two) times daily.  Marland Kitchen ibuprofen (ADVIL,MOTRIN) 200 MG tablet Take 600 mg by mouth every 6 (six) hours as needed for mild pain.  . methocarbamol (ROBAXIN) 500 MG tablet Take 1 tablet (500 mg total) by mouth 4 (four) times daily.  Marland Kitchen oxyCODONE-acetaminophen (ROXICET) 5-325 MG tablet Take 1-2 tablets by mouth every 4 (four) hours as needed for severe pain.    FAMILY HISTORY:  His indicated that the status of his mother is unknown.    SOCIAL HISTORY: He  reports that he has been smoking  Cigarettes.  He has a 30.00 pack-year smoking history. He has never used smokeless tobacco. He reports that he does not drink alcohol or use drugs.   VITAL SIGNS: BP 118/86 (BP Location: Left Arm)   Pulse  (!) 108   Temp 97.1 F (36.2 C) (Oral)   Resp (!) 25   Ht 5\' 9"  (1.753 m)   Wt 89.9 kg (198 lb 3.1 oz)   SpO2 92%   BMI 29.27 kg/m   HEMODYNAMICS:    VENTILATOR SETTINGS:    INTAKE / OUTPUT: I/O last 3 completed shifts: In: 4250 [I.V.:4200; IV Piggyback:50] Out: 2850 [Urine:2850]  PHYSICAL EXAMINATION: General:  Strong male middle-aged. On BiPAP Neuro:  Alert and oriented 3. Speech normal through the BiPAP HEENT:  BiPAP mask on. No neck nodes. No elevated JVP Cardiovascular:  Regular rate and rhythm Lungs:  Respiratory rate around 30. Not paradoxical. Mild accessory muscle use. Able to talk sentences through the BiPAP Abdomen:  Soft nontender Musculoskeletal:  No cyanosis no clubbing no edema. Looks euvolemic Skin:  Intact on exposed areas  LABS: PULMONARY  Recent Labs Lab 08/19/16 0415  PHART 7.425  PCO2ART 35.7  PO2ART 56.8*  HCO3 23.1  O2SAT 88.3    CBC  Recent Labs Lab 08/14/16 1352 08/17/16 2217 08/19/16 0221  HGB 15.8 13.6 13.6  HCT 46.3 39.0 40.4  WBC 9.5 13.0* 13.6*  PLT 216 186 216    COAGULATION  Recent Labs Lab 08/14/16 1352 08/18/16 0309  INR 1.06 1.20    CARDIAC  No results for input(s): TROPONINI in the last 168 hours. No results for input(s): PROBNP in the last 168 hours.   CHEMISTRY  Recent Labs Lab 08/14/16 1352 08/17/16 2217 08/19/16 0221  NA 137 134* 137  K 4.1 3.5 3.7  CL 103 101 107  CO2 25 23 22   GLUCOSE 92 151* 144*  BUN 6 8 15   CREATININE 0.88 0.85 0.72  CALCIUM 9.5 8.4* 8.3*   Estimated Creatinine Clearance: 115.7 mL/min (by C-G formula based on SCr of 0.72 mg/dL).   LIVER  Recent Labs Lab 08/14/16 1352 08/18/16 0309  AST 21  --   ALT 15*  --   ALKPHOS 50  --   BILITOT 0.6  --   PROT 8.1  --   ALBUMIN 3.8  --   INR 1.06 1.20     INFECTIOUS  Recent Labs Lab 08/18/16 0218 08/18/16 0309 08/18/16 0844  LATICACIDVEN 1.6  --  2.4*  PROCALCITON  --  0.34  --      ENDOCRINE CBG  (last 3)  No results for input(s): GLUCAP in the last 72 hours.       IMAGING x48h  - image(s) personally visualized  -   highlighted in bold Ct Angio Chest Pe W Or Wo Contrast  Result Date: 08/18/2016 CLINICAL DATA:  Acute onset of respiratory distress. Recent rotator cuff surgery. Initial encounter. EXAM: CT ANGIOGRAPHY CHEST WITH CONTRAST TECHNIQUE: Multidetector CT imaging of the chest was performed using the standard protocol during bolus administration of intravenous contrast. Multiplanar CT image reconstructions and MIPs were obtained to evaluate the vascular anatomy. CONTRAST:  100 mL of Isovue 370 IV contrast COMPARISON:  Chest radiograph performed earlier today at 10:23 p.m., and CT of the chest performed 07/14/2015 FINDINGS: Cardiovascular: There is no evidence of significant pulmonary embolus. Scattered coronary artery calcifications are seen. The heart remains normal in size. The thoracic aorta is grossly unremarkable. The great vessels are grossly unremarkable. Mediastinum/Nodes: Visualized  mediastinal nodes measure up to 1.9 cm in short axis. Enlarged mediastinal nodes are noted at the subcarinal, aortopulmonary window, right paratracheal and periaortic regions. No pericardial effusion is identified. The thyroid gland is unremarkable. No axillary lymphadenopathy is seen. Lungs/Pleura: Diffuse bilateral regional ground-glass airspace opacification is noted, with underlying diffuse emphysema and interstitial prominence. This reflects markedly worsening bilateral airspace densities, superimposed on the patient's fibrotic change. A variety of etiologies of pneumonia or pneumonitis could have such an appearance. Atypical infection is a concern. No pleural effusion or pneumothorax is seen. Upper Abdomen: The visualized portions of the liver and spleen are grossly unremarkable. Small stones are suggested dependently within the gallbladder. The visualized portions of the pancreas, adrenal glands  and kidneys are unremarkable, aside from small bilateral renal cysts, measuring up to 2.7 cm in size. Musculoskeletal: No acute osseous abnormalities are identified. The visualized musculature is unremarkable in appearance. Review of the MIP images confirms the above findings. IMPRESSION: 1. No evidence of significant pulmonary embolus. 2. Diffuse bilateral regional ground-glass airspace opacification, with underlying emphysema and interstitial prominence. This is markedly worsened from 2016, reflecting acute airspace disease superimposed on the patient's fibrotic change. A variety of etiologies of pneumonia or pneumonitis could have such an appearance. Atypical infection is a concern. 3. Enlarged mediastinal nodes, measuring up to 1.9 cm in short axis. Some of these would be amenable to biopsy, as deemed clinically appropriate. 4. Scattered coronary artery calcifications seen. 5. Cholelithiasis. 6. Scattered bilateral renal cysts. Electronically Signed   By: Roanna RaiderJeffery  Chang M.D.   On: 08/18/2016 00:58   Dg Chest Portable 1 View  Result Date: 08/17/2016 CLINICAL DATA:  Increasing shortness of breath this evening. History of COPD, fibrosis. EXAM: PORTABLE CHEST 1 VIEW COMPARISON:  Chest radiograph August 14, 2016 FINDINGS: Cardiomediastinal silhouette is normal. Increasing interstitial prominence and patchy mid and lower lung zone airspace opacities. No pleural effusion. No pneumothorax. Biapical pleural thickening. RIGHT humeral head suture anchors. IMPRESSION: Increasing interstitial and to lesser extent alveolar airspace opacities concerning for pneumonia superimposed on a background of chronic fibrosis/COPD. Electronically Signed   By: Awilda Metroourtnay  Bloomer M.D.   On: 08/17/2016 23:09         ASSESSMENT / PLAN:  PULMONARY A: #Baseline - Chronic smoker - No known ILD exposures -September 2016 with isolated reduction in diffusion capacity associated with emphysema and non-UIP pattern of interstitial  lung disease - not otherwise specified  #Current - Abrupt respiratory illness with acute hypoxemic respiratory failure with significant deterioration 3 days after right shoulder surgery. Pulmonary embolism ruled out. Diffuse ground glass opacities on CT chest at admission  - Differential diagnosis includes: Undiagnosed IPF flare , acute lung injury postop idiopathic, Boop, acute eosinophilic pneumonia is   P:   Continue BiPAP Broaden antibiotic therapy Intubate if worse Agree with steroids Too high risk for bronchoscopy - will do bronchoscopy with lavage if he gets intubated   CARDIOVASCULAR A:  At risk for MI P:  Cycle enzymes  RENAL A:   At risk for lactic acidosis and renal injury P:   Monitor  GASTROINTESTINAL A:   At risk for aspiration if he gets intubated P:   npo. Except meds  HEMATOLOGIC A:   At risk for anemia of critical illness P:  Monitor DVT prophylaxis  INFECTIOUS A:   Unclear if this is community by pneumonia P:   Check pro calcitonin Check urine Legionella and streptococcal antigen Broaden antibiotic therapy  ENDOCRINE A:   At risk for  hyperglycemia   P:   IC ICU hyperglycemia protocol  NEUROLOGIC A:   At risk for delirium P:   Monitor RASS goal: 0    FAMILY  - Updates: Wife and he extensively updated in the bedside at stepdown unit. He'll be moved to the ICU. High risk for intubation. Prognosis and course uncertain and fraught with high mortality and morbidity. They have been updated about this.  - Inter-disciplinary family meet or Palliative Care meeting due by: 08/26/2016   Critical Care Time devoted to patient care services described in this note is  40  Minutes. This time reflects time of care of this signee Dr Kalman Shan. This critical care time does not reflect procedure time, or teaching time or supervisory time of PA/NP/Med student/Med Resident etc but could involve care discussion time    Dr. Kalman Shan,  M.D., [facility].C.P Pulmonary and Critical Care Medicine Staff Physician [facility] [place] [facility] Pager: [phone], If no answer or between  15:00h - 7:00h: call [phone]  08/19/2016 10:15 AM

## 2016-08-19 NOTE — Procedures (Signed)
Intubation Procedure Note Bradley FraiseDavid M [name] [id number] 29-Aug-1961  Procedure: Intubation Indications: Respiratory insufficiency   Pt with worsening respiratory status throughout the day.  Pt seen > in resp distress. BP 140/80, RR 40s,  HR 120, o2 sats 80-90% on BiPaP 100% FiO2.  I discussed the pts condition with Wife and step daughter and pt.  They agreed to intubation, potentially trache as well.   Procedure Details Consent: Risks of procedure as well as the alternatives and risks of each were explained to the (patient/caregiver).  Consent for procedure obtained. Time Out: Verified patient identification, verified procedure, site/side was marked, verified correct patient position, special equipment/implants available, medications/allergies/relevent history reviewed, required imaging and test results available.  Performed  Maximum sterile technique was used including gloves and mask.  MAC and 4  Gleidsocope was used to visualie the VC.  ET Tube size 7.5 inserted w/o difficulty. Pt's o2 sats were in mid 70s on NRBM prior to intubation.   Post intubation > 88-90%.   He received 2 mg versed, 100 mcg Fentanyl, 20 mg Etomidate, 60 mg Succinylcholine >> all IV and divided doses.    Evaluation Hemodynamic Status: BP stable throughout; O2 sats: transiently fell during during procedure Patient's Current Condition: stable Complications: No apparent complications Patient did tolerate procedure well. Chest X-ray ordered to verify placement.  CXR: pending.   Bradley [name] Bradley [name] 08/19/2016

## 2016-08-19 NOTE — Progress Notes (Signed)
Pharmacy Antibiotic Note  Bradley [name] is a 55 y.o. male admitted on 08/17/2016 with pneumonia.  Pharmacy has been consulted for vancomycin and zosyn dosing.  Plan: Vancomycin 1g IV every 8 hours.  Goal trough 15-20 mcg/mL. Zosyn 3.375g IV q8h (4 hour infusion).  Monitor culture data, renal function and clinical course VT at SS prn  Height: 5\' 9"  (175.3 cm) Weight: 198 lb 3.1 oz (89.9 kg) IBW/kg (Calculated) : 70.7  Temp (24hrs), Avg:97.1 F (36.2 C), Min:96 F (35.6 C), Max:97.8 F (36.6 C)   Recent Labs Lab 08/14/16 1352 08/17/16 2217 08/18/16 0218 08/18/16 0844 08/19/16 0221  WBC 9.5 13.0*  --   --  13.6*  CREATININE 0.88 0.85  --   --  0.72  LATICACIDVEN  --   --  1.6 2.4*  --     Estimated Creatinine Clearance: 115.7 mL/min (by C-G formula based on SCr of 0.72 mg/dL).    Allergies  Allergen Reactions  . Wellbutrin [Bupropion] Cough    Antimicrobials this admission: Azith 10/20 >>  CTX 10/20 >> 10/21 Vanc 10/21 >>  Zosyn 10/21 >>  Dose adjustments this admission: n/a  Microbiology results: 10/20 BCx: sent  UCx:    Sputum:   10/20 MRSA PCR: neg 10/20 Resp PCR: neg   Arlean Hoppingorey M. Newman PiesBall, PharmD, BCPS Clinical Pharmacist Pager [phone] 08/19/2016 12:05 PM

## 2016-08-19 NOTE — Progress Notes (Signed)
   [facility]   Pt with ARDS, difficult to oxygenate despite 100% Fio2, PEEP 18. Will paralyze.  Cont versed and fentanyl.  Keep NPO.  Holding off on bronchoscopy until O2 sats are better.   Pollie MeyerJ. Angelo A de Dios, MD 08/19/2016, 11:56 PM [facility] Pager [phone] After 3 pm or if no answer, call [phone]

## 2016-08-20 ENCOUNTER — Inpatient Hospital Stay (HOSPITAL_COMMUNITY): Payer: BLUE CROSS/BLUE SHIELD

## 2016-08-20 DIAGNOSIS — J80 Acute respiratory distress syndrome: Secondary | ICD-10-CM

## 2016-08-20 DIAGNOSIS — J8410 Pulmonary fibrosis, unspecified: Secondary | ICD-10-CM

## 2016-08-20 DIAGNOSIS — J441 Chronic obstructive pulmonary disease with (acute) exacerbation: Secondary | ICD-10-CM

## 2016-08-20 DIAGNOSIS — F17210 Nicotine dependence, cigarettes, uncomplicated: Secondary | ICD-10-CM

## 2016-08-20 LAB — RESPIRATORY PANEL BY PCR
ADENOVIRUS-RVPPCR: NOT DETECTED
Bordetella pertussis: NOT DETECTED
CHLAMYDOPHILA PNEUMONIAE-RVPPCR: NOT DETECTED
CORONAVIRUS HKU1-RVPPCR: NOT DETECTED
CORONAVIRUS NL63-RVPPCR: NOT DETECTED
Coronavirus 229E: NOT DETECTED
Coronavirus OC43: NOT DETECTED
Influenza A: NOT DETECTED
Influenza B: NOT DETECTED
MYCOPLASMA PNEUMONIAE-RVPPCR: NOT DETECTED
Metapneumovirus: NOT DETECTED
PARAINFLUENZA VIRUS 1-RVPPCR: NOT DETECTED
PARAINFLUENZA VIRUS 3-RVPPCR: NOT DETECTED
Parainfluenza Virus 2: NOT DETECTED
Parainfluenza Virus 4: NOT DETECTED
Respiratory Syncytial Virus: NOT DETECTED
Rhinovirus / Enterovirus: NOT DETECTED

## 2016-08-20 LAB — CBC WITH DIFFERENTIAL/PLATELET
BASOS ABS: 0 10*3/uL (ref 0.0–0.1)
BASOS PCT: 0 %
EOS PCT: 0 %
Eosinophils Absolute: 0 10*3/uL (ref 0.0–0.7)
HCT: 42.4 % (ref 39.0–52.0)
Hemoglobin: 14 g/dL (ref 13.0–17.0)
LYMPHS PCT: 6 %
Lymphs Abs: 0.8 10*3/uL (ref 0.7–4.0)
MCH: 30.6 pg (ref 26.0–34.0)
MCHC: 33 g/dL (ref 30.0–36.0)
MCV: 92.6 fL (ref 78.0–100.0)
Monocytes Absolute: 0.3 10*3/uL (ref 0.1–1.0)
Monocytes Relative: 2 %
Neutro Abs: 12.1 10*3/uL — ABNORMAL HIGH (ref 1.7–7.7)
Neutrophils Relative %: 92 %
PLATELETS: 219 10*3/uL (ref 150–400)
RBC: 4.58 MIL/uL (ref 4.22–5.81)
RDW: 13.7 % (ref 11.5–15.5)
WBC: 13.2 10*3/uL — AB (ref 4.0–10.5)

## 2016-08-20 LAB — BLOOD GAS, ARTERIAL
Acid-Base Excess: 2 mmol/L (ref 0.0–2.0)
BICARBONATE: 28.9 mmol/L — AB (ref 20.0–28.0)
Drawn by: 345601
FIO2: 80
LHR: 35 {breaths}/min
MECHVT: 450 mL
O2 SAT: 98.2 %
PATIENT TEMPERATURE: 97.4
PCO2 ART: 70 mmHg — AB (ref 32.0–48.0)
PEEP: 14 cmH2O
PH ART: 7.236 — AB (ref 7.350–7.450)
PO2 ART: 152 mmHg — AB (ref 83.0–108.0)

## 2016-08-20 LAB — GLUCOSE, CAPILLARY
GLUCOSE-CAPILLARY: 162 mg/dL — AB (ref 65–99)
GLUCOSE-CAPILLARY: 156 mg/dL — AB (ref 65–99)
GLUCOSE-CAPILLARY: 191 mg/dL — AB (ref 65–99)
Glucose-Capillary: 188 mg/dL — ABNORMAL HIGH (ref 65–99)
Glucose-Capillary: 194 mg/dL — ABNORMAL HIGH (ref 65–99)
Glucose-Capillary: 206 mg/dL — ABNORMAL HIGH (ref 65–99)
Glucose-Capillary: 221 mg/dL — ABNORMAL HIGH (ref 65–99)

## 2016-08-20 LAB — POCT I-STAT 3, ART BLOOD GAS (G3+)
ACID-BASE DEFICIT: 1 mmol/L (ref 0.0–2.0)
ACID-BASE EXCESS: 1 mmol/L (ref 0.0–2.0)
Acid-Base Excess: 3 mmol/L — ABNORMAL HIGH (ref 0.0–2.0)
BICARBONATE: 29.6 mmol/L — AB (ref 20.0–28.0)
BICARBONATE: 32.8 mmol/L — AB (ref 20.0–28.0)
Bicarbonate: 31.9 mmol/L — ABNORMAL HIGH (ref 20.0–28.0)
O2 SAT: 100 %
O2 Saturation: 99 %
O2 Saturation: 93 %
PCO2 ART: 68.4 mmHg — AB (ref 32.0–48.0)
PO2 ART: 176 mmHg — AB (ref 83.0–108.0)
PO2 ART: 337 mmHg — AB (ref 83.0–108.0)
PO2 ART: 78 mmHg — AB (ref 83.0–108.0)
Patient temperature: 97.4
Patient temperature: 97.7
TCO2: 32 mmol/L (ref 0–100)
TCO2: 34 mmol/L (ref 0–100)
TCO2: 36 mmol/L (ref 0–100)
pCO2 arterial: 75.6 mmHg (ref 32.0–48.0)
pCO2 arterial: 88.2 mmHg (ref 32.0–48.0)
pH, Arterial: 7.174 — CL (ref 7.350–7.450)
pH, Arterial: 7.197 — CL (ref 7.350–7.450)
pH, Arterial: 7.275 — ABNORMAL LOW (ref 7.350–7.450)

## 2016-08-20 LAB — BASIC METABOLIC PANEL
ANION GAP: 11 (ref 5–15)
BUN: 24 mg/dL — AB (ref 6–20)
CALCIUM: 8.3 mg/dL — AB (ref 8.9–10.3)
CO2: 26 mmol/L (ref 22–32)
Chloride: 104 mmol/L (ref 101–111)
Creatinine, Ser: 1.13 mg/dL (ref 0.61–1.24)
GFR calc Af Amer: >60 mL/min (ref >60)
Glucose, Bld: 215 mg/dL — ABNORMAL HIGH (ref 65–99)
POTASSIUM: 4.4 mmol/L (ref 3.5–5.1)
SODIUM: 141 mmol/L (ref 135–145)

## 2016-08-20 LAB — TROPONIN I: Troponin I: 0.07 ng/mL (ref <0.03)

## 2016-08-20 MED ORDER — NOREPINEPHRINE BITARTRATE 1 MG/ML IV SOLN
0.0000 ug/min | INTRAVENOUS | Status: DC
  Administered 2016-08-20 – 2016-08-21 (×2): 5 ug/min via INTRAVENOUS
  Filled 2016-08-20 (×2): qty 4

## 2016-08-20 MED ORDER — CITALOPRAM HYDROBROMIDE 20 MG PO TABS
30.0000 mg | ORAL_TABLET | Freq: Every morning | ORAL | Status: DC
  Administered 2016-08-21 – 2016-08-31 (×11): 30 mg
  Filled 2016-08-20 (×11): qty 1

## 2016-08-20 MED ORDER — FAMOTIDINE IN NACL 20-0.9 MG/50ML-% IV SOLN
20.0000 mg | Freq: Two times a day (BID) | INTRAVENOUS | Status: DC
  Administered 2016-08-21 – 2016-09-08 (×39): 20 mg via INTRAVENOUS
  Filled 2016-08-20 (×41): qty 50

## 2016-08-20 MED ORDER — GUAIFENESIN-DM 100-10 MG/5ML PO SYRP
5.0000 mL | ORAL_SOLUTION | ORAL | Status: DC
  Administered 2016-08-20 – 2016-08-23 (×12): 5 mL
  Filled 2016-08-20 (×19): qty 5

## 2016-08-20 NOTE — Consult Note (Signed)
PULMONARY / CRITICAL CARE MEDICINE   Name: Bradley [name] MRN: [id number] DOB: 03/21/1961    ADMISSION DATE:  08/17/2016 CONSULTATION DATE:  08/19/16  REFERRING MD:  Dr Thayer Ohm Rama  CHIEF COMPLAINT:  Acute resp failure with hypoxemia  BRIEF 55 year old smoker, with PFT nov 2016 showing isolated reduction in dlco to 59% and quite functional and not on home o2. Followed by Dr Sherene Sires -0 last seen SEpt/Nov 2016 based on CT chest evidence showing diffuse changes of centrilobular and paraseptal emphysema along with prominent interstitial markings primarily in the upper lobes consistent with pulmonary fibrosis. This was a non--UIP pattern. Patient recommended quitting smoking because of concerns of DIP. According to the patient and his wife has continued to smoke. He has been in his baseline status of health and very functional. Then on 08/14/2016 he had a chest x-ray preoperative that only showed chronic changes without change. for a right shoulder surgery rotator cuff tear. The procedure was uneventful. Postoperative course was uneventful. There was no vomiting. Then abruptly on 08/16/2016 he started getting acutely ill with shortness of breath and cough with brown colored sputum. Then admitted to the hospital. Initial white count was 13,000. BNP was normal. CT angiogram chest ruled out pulmonary embolism but showed diffuse ground glass opacities and enlarged mediastinal node measuring close to 2 cm. He was started on community acquired pneumonia antibiotics but is developed progressive acute respiratory failure necessitating Solu-Medrol since 08/18/2016 evening. And from the early hours of 08/19/2016 he's been on BiPAP with significant respiratory distress and therefore pulmonary critical care has been consulted  At baseline he denies any mold exposure or mildew exposure or working with dust. He denies any birds in the house. He continues to smoke. He has been afebrile in the  hospital   Eventse 08/17/2016 - admit 08/18/2016-respiratory virus panel, HIV and flu panel PCR negative 08/19/16 -bipap, move to icu ->    SUBJECTIVE/OVERNIGHT/INTERVAL HX 08/20/16 -> Intubated overnight (DIFFICULT). On ARDS protocol with nimbex now. ECHO: LVEF 60-65%. normal wall thickness, normal wall motion, normal LA.   size, moderate TR, RVSP 56 mmHg (moderate pulmonary   hypertension), estimated RA pressure of 8 mmHg.   VITAL SIGNS: BP (!) 82/48   Pulse 88   Temp 98.6 F (37 C) (Oral)   Resp (!) 35   Ht 5\' 9"  (1.753 m)   Wt 89.9 kg (198 lb 3.1 oz)   SpO2 97%   BMI 29.27 kg/m   HEMODYNAMICS:    VENTILATOR SETTINGS: Vent Mode: PRVC FiO2 (%):  [70 %-100 %] 70 % Set Rate:  [22 bmp-35 bmp] 35 bmp Vt Set:  [440 mL-450 mL] 440 mL PEEP:  [12 cmH20-18 cmH20] 12 cmH20 Plateau Pressure:  [21 cmH20-35 cmH20] 29 cmH20  INTAKE / OUTPUT: I/O last 3 completed shifts: In: 2876.7 [I.V.:2071.7; Other:30; IV Piggyback:775] Out: 3050 [Urine:3000; Emesis/NG output:50]  PHYSICAL EXAMINATION: General:  Strong male middle-aged. On BiPAP Neuro:  Alert and oriented 3. Speech normal through the BiPAP HEENT:  BiPAP mask on. No neck nodes. No elevated JVP Cardiovascular:  Regular rate and rhythm Lungs:  Respiratory rate around 30. Not paradoxical. Mild accessory muscle use. Able to talk sentences through the BiPAP Abdomen:  Soft nontender Musculoskeletal:  No cyanosis no clubbing no edema. Looks euvolemic Skin:  Intact on exposed areas  LABS: PULMONARY  Recent Labs Lab 08/19/16 0415 08/20/16 0042 08/20/16 0318 08/20/16 0501  PHART 7.425 7.174* 7.197* 7.236*  PCO2ART 35.7 88.2* 75.6* 70.0*  PO2ART 56.8*  337.0* 176.0* 152*  HCO3 23.1 32.8* 29.6* 28.9*  TCO2  --  36 32  --   O2SAT 88.3 100.0 99.0 98.2    CBC  Recent Labs Lab 08/17/16 2217 08/19/16 0221 08/20/16 0250  HGB 13.6 13.6 14.0  HCT 39.0 40.4 42.4  WBC 13.0* 13.6* 13.2*  PLT 186 216 219     COAGULATION  Recent Labs Lab 08/14/16 1352 08/18/16 0309  INR 1.06 1.20    CARDIAC    Recent Labs Lab 08/19/16 1057 08/19/16 1900 08/20/16 0250  TROPONINI <0.03 <0.03 0.07*   No results for input(s): PROBNP in the last 168 hours.   CHEMISTRY  Recent Labs Lab 08/14/16 1352 08/17/16 2217 08/19/16 0221 08/20/16 0250  NA 137 134* 137 141  K 4.1 3.5 3.7 4.4  CL 103 101 107 104  CO2 25 23 22 26   GLUCOSE 92 151* 144* 215*  BUN 6 8 15  24*  CREATININE 0.88 0.85 0.72 1.13  CALCIUM 9.5 8.4* 8.3* 8.3*   Estimated Creatinine Clearance: 81.9 mL/min (by C-G formula based on SCr of 1.13 mg/dL).   LIVER  Recent Labs Lab 08/14/16 1352 08/18/16 0309  AST 21  --   ALT 15*  --   ALKPHOS 50  --   BILITOT 0.6  --   PROT 8.1  --   ALBUMIN 3.8  --   INR 1.06 1.20     INFECTIOUS  Recent Labs Lab 08/18/16 0218 08/18/16 0309 08/18/16 0844 08/19/16 1057  LATICACIDVEN 1.6  --  2.4* 1.8  PROCALCITON  --  0.34  --   --      ENDOCRINE CBG (last 3)   Recent Labs  08/20/16 0043 08/20/16 0342 08/20/16 0738  GLUCAP 206* 194* 162*         IMAGING x48h  - image(s) personally visualized  -   highlighted in bold Dg Chest Port 1 View  Result Date: 08/20/2016 CLINICAL DATA:  Endotracheal and orogastric tube placement. History of pulmonary fibrosis, COPD and pneumonia. EXAM: PORTABLE CHEST 1 VIEW COMPARISON:  Chest radiograph August 17, 2016 and CT chest August 17, 2016 FINDINGS: Endotracheal tube tip projects 5 cm above the carina. Nasogastric tube past proximal stomach, distal tip not imaged. Cardiac silhouette is mildly enlarged, mediastinal silhouette is nonsuspicious. Diffuse interstitial prominence with faint lower lung zone airspace opacities. No pleural effusion. No pneumothorax. Suture anchors RIGHT humeral head. Soft tissue planes and included osseous structure nonsuspicious. IMPRESSION: Endotracheal tube tip projects 5 cm above the carina. Nasogastric  tube past proximal stomach. Diffuse interstitial prominence, with hazy densities in lower lung zones compatible with atypical infection and, fibrosis. Electronically Signed   By: Awilda Metro M.D.   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 No known ILD exposures per hx  #Admit 08/17/2016 - Abrupt respiratory illness with acute hypoxemic respiratory failure with significant deterioration 3 days after right shoulder surgery. Pulmonary embolism ruled out. 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 On 70% fio2, peep 12. PF ratio 217   P:   ARDS NET  protocol with nimbex since 08/20/16 STart Prone ventilation once CVL placed Repeat RVP via trach aspirate BAL with lavage when more stable Cotinue abx for now Continue steroids Check autoimmune and vasculitis   CARDIOVASCULAR A:  MI ruled out. ECHO ok  10/22 - not on pressors  P:  monitor   RENAL A:   At risk for lactic acidosis and renal injur y P:   Monitor  GASTROINTESTINAL A:   At risk for aspiration if he gets intubated P:   npo. Except meds  HEMATOLOGIC A:   At risk for anemia of critical illness P:  Monitor DVT prophylaxis  INFECTIOUS A:   Unclear if this is community by pneumonia P:   Check repeat RVP Check pro calcitonin Await urine Legionella and streptococcal antigen Broadeantibiotic therapy  ENDOCRINE A:   At risk for hyperglycemia   P:   IC ICU hyperglycemia protocol  NEUROLOGIC A:   At risk for delirium P:    Monitor RASS goal: -5 with bIS goal < 50 Fent/versed gtt with short term nimbex    FAMILY  - Updates: on 08/19/16 Wife and he extensively updated in the bedside at stepdown unit. He'll be moved to the ICU. High risk for intubation. Prognosis and course uncertain and fraught with high mortality and morbidity. They have been updated about this.  - Inter-disciplinary family meet or Palliative Care meeting due by: 08/26/2016 - done 08/20/16   Interdisciplinary Goals of Care Family Meeting   Date carried out:: 08/20/2016  Location of the meeting: Unit  Member's involved: Physician, Bedside Registered Nurse, Family Member or next of kin and Other: wife dpoa  Durable Power of Attorney or Environmental health practitioneracting medical decision maker: dpoa wife    Discussion: We discussed goals of care for Ripley FraiseDavid M Checo .  Severe ards and poor prognosis and long haul explained. She is tearful but hopeful and thankful   Code status: Full Code  Disposition: Continue current acute care  Time spent for the meeting: 10min  Areta Terwilliger 08/20/2016, 8:36 AM     Critical Care Time devoted to patient care services described in this note is  40  Minutes. This time reflects time of care of this signee Dr Kalman ShanMurali Rogan Wigley. This critical care time does not reflect procedure time, or teaching time or supervisory time of PA/NP/Med student/Med Resident etc but could involve care discussion time    Dr. Kalman ShanMurali Teal Bontrager, M.D., [facility]P Pulmonary and Critical Care Medicine Staff Physician [facility] Pager: [phone], If no answer or between  15:00h - 7:00h: call [phone]  08/20/2016 8:16 AM

## 2016-08-20 NOTE — Progress Notes (Signed)
Called [facility], spoke to nurse Vinnie LangtonGretchen regarding pt sbp 80s, nurse states Dr Deterding aware of bps and is okay with MAPs 60 or greater, no further orders given to this rn at this time.

## 2016-08-20 NOTE — Progress Notes (Signed)
bipap mask removed for to administer po med, pt swallowed meds with sips of water without difficulty, pt sat dropped to 77 with an increase in tachypnea and labored breathing, this rn resumed and paged RT to bedside. Gradual return to baseline sat of 92%, RT spoke to elink md, will send Dr Lucy Chrisios to bedside to assess pt.

## 2016-08-20 NOTE — Progress Notes (Signed)
Called Dr Deterding to report abg results ph 7.24 pcot 70 po2 152 hco3 28.9, no further orders given to this rn.

## 2016-08-20 NOTE — Progress Notes (Signed)
RT advanced ETT 2cm per MD order. No complications. Vital signs stable at this time. Patient tolerated well. RT will continue to monitor.

## 2016-08-20 NOTE — Procedures (Signed)
Arterial Catheter Insertion Procedure Note Bradley FraiseDavid M [name] [id number] [birth year]  Procedure: Insertion of Arterial Catheter  Indications: Blood pressure monitoring and Frequent blood sampling  Procedure Details Consent: Unable to obtain consent because of emergent medical necessity. Time Out: Verified patient identification, verified procedure, site/side was marked, verified correct patient position, special equipment/implants available, medications/allergies/relevent history reviewed, required imaging and test results available.  Performed  Maximum sterile technique was used including antiseptics, cap, gloves, gown, hand hygiene, mask and sheet. Skin prep: Chlorhexidine; local anesthetic administered 20 gauge catheter was inserted into left radial artery using the Seldinger technique.  Evaluation Blood flow good; BP tracing good. Complications: No apparent complications.   Bradley [name], Bradley [name] 08/20/2016

## 2016-08-20 NOTE — Procedures (Signed)
Central Venous Catheter Insertion Procedure Note Ripley FraiseDavid M Klas [id number] Jun 06, 1961  Procedure: Insertion of Central Venous Catheter Indications: Assessment of intravascular volume, Drug and/or fluid administration and Frequent blood sampling  Procedure Details Consent: Risks of procedure as well as the alternatives and risks of each were explained to the (patient/caregiver).  Consent for procedure obtained.   Time Out: Verified patient identification, verified procedure, site/side was marked, verified correct patient position, special equipment/implants available, medications/allergies/relevent history reviewed, required imaging and test results available.  Performed  Maximum sterile technique was used including antiseptics, cap, gloves, gown, hand hygiene, mask and sheet. Skin prep: Chlorhexidine; local anesthetic administered A antimicrobial bonded/coated triple lumen catheter was placed in the right internal jugular vein using the Seldinger technique.  Evaluation Blood flow good Complications: No apparent complications Patient did tolerate procedure well. Chest X-ray ordered to verify placement.  CXR: pending.   Procedure performed under direct supervision of Dr. Marchelle Gearingamaswamy and with ultrasound guidance for real time vessel cannulation.      Canary BrimBrandi Ollis, NP-C [facility] Pgr: [phone] or if no answer [phone] 08/20/2016, 11:37 AM   Alyson ReedyWesam G. Curtistine Pettitt, M.D. [facility]. Pager: [phone]. After hours pager: [phone].

## 2016-08-20 NOTE — Progress Notes (Signed)
Called [facility] md to inform of sbp 90s with increase titration of fentanyl and versed before starting nimbex drip, Dr Deterding instructed this rn to continue titrating meds

## 2016-08-21 ENCOUNTER — Inpatient Hospital Stay (HOSPITAL_COMMUNITY): Payer: BLUE CROSS/BLUE SHIELD

## 2016-08-21 LAB — LEGIONELLA PNEUMOPHILA SEROGP 1 UR AG: L. pneumophila Serogp 1 Ur Ag: NEGATIVE

## 2016-08-21 LAB — CBC WITH DIFFERENTIAL/PLATELET
BASOS ABS: 0 10*3/uL (ref 0.0–0.1)
BASOS PCT: 0 %
EOS ABS: 0 10*3/uL (ref 0.0–0.7)
Eosinophils Relative: 0 %
HCT: 39.3 % (ref 39.0–52.0)
HEMOGLOBIN: 12.5 g/dL — AB (ref 13.0–17.0)
Lymphocytes Relative: 7 %
Lymphs Abs: 0.7 10*3/uL (ref 0.7–4.0)
MCH: 30.1 pg (ref 26.0–34.0)
MCHC: 31.8 g/dL (ref 30.0–36.0)
MCV: 94.7 fL (ref 78.0–100.0)
Monocytes Absolute: 0.5 10*3/uL (ref 0.1–1.0)
Monocytes Relative: 6 %
NEUTROS PCT: 87 %
Neutro Abs: 8.3 10*3/uL — ABNORMAL HIGH (ref 1.7–7.7)
Platelets: 257 10*3/uL (ref 150–400)
RBC: 4.15 MIL/uL — AB (ref 4.22–5.81)
RDW: 13.9 % (ref 11.5–15.5)
WBC: 9.5 10*3/uL (ref 4.0–10.5)

## 2016-08-21 LAB — GLUCOSE, CAPILLARY
GLUCOSE-CAPILLARY: 169 mg/dL — AB (ref 65–99)
Glucose-Capillary: 191 mg/dL — ABNORMAL HIGH (ref 65–99)
Glucose-Capillary: 204 mg/dL — ABNORMAL HIGH (ref 65–99)
Glucose-Capillary: 182 mg/dL — ABNORMAL HIGH (ref 65–99)
Glucose-Capillary: 174 mg/dL — ABNORMAL HIGH (ref 65–99)

## 2016-08-21 LAB — BLOOD GAS, ARTERIAL
Acid-Base Excess: 9.8 mmol/L — ABNORMAL HIGH (ref 0.0–2.0)
BICARBONATE: 35.5 mmol/L — AB (ref 20.0–28.0)
Drawn by: 301361
FIO2: 50
LHR: 35 {breaths}/min
O2 Saturation: 96.3 %
PATIENT TEMPERATURE: 98.4
PEEP/CPAP: 10 cmH2O
PO2 ART: 90.6 mmHg (ref 83.0–108.0)
VT: 440 mL
pCO2 arterial: 65.5 mmHg (ref 32.0–48.0)
pH, Arterial: 7.352 (ref 7.350–7.450)

## 2016-08-21 LAB — BASIC METABOLIC PANEL
Anion gap: 9 (ref 5–15)
BUN: 31 mg/dL — ABNORMAL HIGH (ref 6–20)
CHLORIDE: 104 mmol/L (ref 101–111)
CO2: 31 mmol/L (ref 22–32)
Calcium: 8.2 mg/dL — ABNORMAL LOW (ref 8.9–10.3)
Creatinine, Ser: 0.93 mg/dL (ref 0.61–1.24)
GFR calc non Af Amer: >60 mL/min (ref >60)
Glucose, Bld: 224 mg/dL — ABNORMAL HIGH (ref 65–99)
POTASSIUM: 4.5 mmol/L (ref 3.5–5.1)
SODIUM: 144 mmol/L (ref 135–145)

## 2016-08-21 LAB — MPO/PR-3 (ANCA) ANTIBODIES
ANCA Proteinase 3: <3.5 U/mL (ref 0.0–3.5)
Myeloperoxidase Abs: <9 U/mL (ref 0.0–9.0)

## 2016-08-21 LAB — MAGNESIUM: Magnesium: 2.7 mg/dL — ABNORMAL HIGH (ref 1.7–2.4)

## 2016-08-21 LAB — GLOMERULAR BASEMENT MEMBRANE ANTIBODIES: GBM Ab: 3 units (ref 0–20)

## 2016-08-21 LAB — ANTI-SCLERODERMA ANTIBODY: Scleroderma (Scl-70) (ENA) Antibody, IgG: <0.2 AI (ref 0.0–0.9)

## 2016-08-21 LAB — ANTINUCLEAR ANTIBODIES, IFA: ANA Ab, IFA: NEGATIVE

## 2016-08-21 LAB — PHOSPHORUS: Phosphorus: 4 mg/dL (ref 2.5–4.6)

## 2016-08-21 LAB — STREP PNEUMONIAE URINARY ANTIGEN: STREP PNEUMO URINARY ANTIGEN: NEGATIVE

## 2016-08-21 LAB — VANCOMYCIN, TROUGH: VANCOMYCIN TR: 19 ug/mL (ref 15–20)

## 2016-08-21 LAB — CYCLIC CITRUL PEPTIDE ANTIBODY, IGG/IGA: CCP ANTIBODIES IGG/IGA: 5 U (ref 0–19)

## 2016-08-21 LAB — RHEUMATOID FACTOR: RHEUMATOID FACTOR: 17.6 [IU]/mL — AB (ref 0.0–13.9)

## 2016-08-21 LAB — ANTI-DNA ANTIBODY, DOUBLE-STRANDED

## 2016-08-21 MED ORDER — FUROSEMIDE 10 MG/ML IJ SOLN
40.0000 mg | Freq: Four times a day (QID) | INTRAMUSCULAR | Status: AC
  Administered 2016-08-21 (×3): 40 mg via INTRAVENOUS
  Filled 2016-08-21 (×3): qty 4

## 2016-08-21 MED ORDER — INSULIN ASPART 100 UNIT/ML ~~LOC~~ SOLN
2.0000 [IU] | SUBCUTANEOUS | Status: DC
  Administered 2016-08-21: 4 [IU] via SUBCUTANEOUS
  Administered 2016-08-21: 6 [IU] via SUBCUTANEOUS
  Administered 2016-08-21 – 2016-08-22 (×7): 4 [IU] via SUBCUTANEOUS

## 2016-08-21 MED ORDER — INSULIN ASPART 100 UNIT/ML ~~LOC~~ SOLN: 2.0000 [IU] | SUBCUTANEOUS | Status: DC

## 2016-08-21 MED ORDER — PRO-STAT SUGAR FREE PO LIQD: 30.0000 mL | Freq: Two times a day (BID) | ORAL | Status: DC

## 2016-08-21 MED ORDER — POTASSIUM CHLORIDE 20 MEQ/15ML (10%) PO SOLN: 40.0000 meq | Freq: Once | ORAL | Status: DC

## 2016-08-21 MED ORDER — INSULIN ASPART 100 UNIT/ML ~~LOC~~ SOLN
1.0000 [IU] | SUBCUTANEOUS | Status: DC
  Administered 2016-08-21: 3 [IU] via SUBCUTANEOUS

## 2016-08-21 MED ORDER — VITAL AF 1.2 CAL PO LIQD
1000.0000 mL | ORAL | Status: DC
  Administered 2016-08-21 – 2016-09-08 (×29): 1000 mL
  Filled 2016-08-21 (×5): qty 1000

## 2016-08-21 MED ORDER — POTASSIUM CHLORIDE 20 MEQ/15ML (10%) PO SOLN
40.0000 meq | Freq: Once | ORAL | Status: AC
  Administered 2016-08-21: 40 meq
  Filled 2016-08-21: qty 30

## 2016-08-21 NOTE — Progress Notes (Signed)
Pharmacy Antibiotic Note  Bradley [name] is a 55 y.o. male admitted on 08/17/2016 with pneumonia.  Pharmacy has been consulted for vancomycin and zosyn dosing. Renal function has remained relatively stable. Vancomycin trough therapeutic today at 19, drawn appropriately. Plan per CCM is to continue broad-spectrum antibiotics for now.  Plan: -Vancomycin 1g IV q8h (goal trough 15-20 mcg/ml) -Zosyn 3.375 g IV q8h EI -Monitor culture data, renal function and clinical course -Monitor length of therapy and narrow antimicrobials as appropriate  Height: 5\' 9"  (175.3 cm) Weight: 198 lb 3.1 oz (89.9 kg) IBW/kg (Calculated) : 70.7  Temp (24hrs), Avg:98.2 F (36.8 C), Min:97.5 F (36.4 C), Max:99 F (37.2 C)   Recent Labs Lab 08/14/16 1352 08/17/16 2217 08/18/16 0218 08/18/16 0844 08/19/16 0221 08/19/16 1057 08/20/16 0250 08/21/16 0400 08/21/16 1130  WBC 9.5 13.0*  --   --  13.6*  --  13.2* 9.5  --   CREATININE 0.88 0.85  --   --  0.72  --  1.13 0.93  --   LATICACIDVEN  --   --  1.6 2.4*  --  1.8  --   --   --   VANCOTROUGH  --   --   --   --   --   --   --   --  19    Estimated Creatinine Clearance: 99.5 mL/min (by C-G formula based on SCr of 0.93 mg/dL).    Allergies  Allergen Reactions  . Wellbutrin [Bupropion] Cough    Antimicrobials this admission:  Azith 10/20>>10/21 CTX 10/20>>10/21 Vanc 10/21 >> Zosyn 10/21 >>  Dose adjustments this admission:  10/23 VT: 19 > no dose adjustment indicated  Microbiology results:  10/20 Respiratory panel PCR: neg 10/20 BCx: ngtd 10/22 Trach Aspirate: ngtd   10/20 MRSA PCR: neg 10/22 Resp panel PCR: neg 10/23 Legionella: IP  Fredonia HighlandMichael Bitonti, PharmD PGY-1 Pharmacy Resident Pager: [phone] 08/21/2016

## 2016-08-21 NOTE — Progress Notes (Signed)
RT advanced patients ETT by 3cm per MD verbal order. Patient had good return volumes and is tolerating well at this time. RT will continue to monitor.

## 2016-08-21 NOTE — Progress Notes (Signed)
PULMONARY / CRITICAL CARE MEDICINE   Name: Bradley [name] MRN: [id number] DOB: 1961-01-26    ADMISSION DATE:  08/17/2016 CONSULTATION DATE:  08/19/16  REFERRING MD:  Dr Thayer Ohm Rama  CHIEF COMPLAINT:  Acute resp failure with hypoxemia  BRIEF 55 year old smoker, with PFT nov 2016 showing isolated reduction in dlco to 59% and quite functional and not on home o2. Followed by Dr Sherene Sires -0 last seen Sept/Nov 2016 based on CT chest evidence showing diffuse changes of centrilobular and paraseptal emphysema along with prominent interstitial markings primarily in the upper lobes consistent with pulmonary fibrosis. This was a non--UIP pattern. Patient recommended quitting smoking because of concerns of DIP. According to the patient and his wife, has continued to smoke. He has been in his baseline status of health and very functional. Then on 08/14/2016 he had a chest x-ray preoperative that only showed chronic changes without change for a right shoulder surgery rotator cuff tear. The procedure was uneventful. Postoperative course was uneventful. There was no vomiting. Then abruptly on 08/16/2016 he started getting acutely ill with shortness of breath and cough with brown colored sputum. Then admitted to the hospital. Initial white count was 13,000. BNP was normal. CT angiogram chest ruled out pulmonary embolism but showed diffuse ground glass opacities and enlarged mediastinal node measuring close to 2 cm. He was started on community acquired pneumonia antibiotics but is developed progressive acute respiratory failure necessitating Solu-Medrol since 08/18/2016 evening. And from the early hours of 08/19/2016 he's been on BiPAP with significant respiratory distress and therefore pulmonary critical care has been consulted  At baseline he denies any mold exposure or mildew exposure or working with dust. He denies any birds in the house. He continues to smoke. He has been afebrile in the  hospital   Events: 08/17/2016 - admit 08/18/2016-respiratory virus panel, HIV and flu panel PCR negative 08/19/16 -bipap, move to icu -> intubated 10/21 10/22 repeat RVP from trach aspirate - negative   SUBJECTIVE/OVERNIGHT/INTERVAL HX 08/20/16 -> Intubated overnight (DIFFICULT). On ARDS protocol with nimbex now. ECHO: LVEF 60-65%. normal wall thickness, normal wall motion, normal LA size, moderate TR, RVSP 56 mmHg (moderate pulmonary hypertension), estimated RA pressure of 8 mmHg.   VITAL SIGNS: BP 104/70 (BP Location: Right Arm)   Pulse 74   Temp 98.4 F (36.9 C) (Oral)   Resp (!) 35   Ht 5\' 9"  (1.753 m)   Wt 89.9 kg (198 lb 3.1 oz)   SpO2 96%   BMI 29.27 kg/m   HEMODYNAMICS:    VENTILATOR SETTINGS: Vent Mode: PRVC FiO2 (%):  [50 %-60 %] 50 % Set Rate:  [35 bmp] 35 bmp Vt Set:  [440 mL] 440 mL PEEP:  [10 cmH20-12 cmH20] 10 cmH20 Plateau Pressure:  [23 cmH20-28 cmH20] 23 cmH20  INTAKE / OUTPUT: I/O last 3 completed shifts: In: 3130.1 [I.V.:2120.1; Other:30; NG/GT:30; IV Piggyback:950] Out: 3340 [Urine:3150; Emesis/NG output:190]   Lines: Foley 10/21 >> ETT and OG 10/21 >> CVC 10/22 >> PIV x 3 A-line left radial 10/22 >>  PHYSICAL EXAMINATION: General:  Middle-aged, sedated ETT in place Neuro: Sedated and paralyzed  HEENT:  ETT in place, Lillie/AT, PERRL, and EOM-I Cardiovascular:  Regular rate and rhythm Lungs:  Respiratory rate around 35, synchronous with vent Abdomen:  Soft nontender Musculoskeletal:  No cyanosis no clubbing no edema. Looks euvolemic; clean dry bandage over R shoulder Skin:  Intact on exposed areas  LABS:  PULMONARY  Recent Labs Lab 08/20/16 0042 08/20/16 0318 08/20/16  0501 08/20/16 1143 08/21/16 0855  PHART 7.174* 7.197* 7.236* 7.275* 7.352  PCO2ART 88.2* 75.6* 70.0* 68.4* 65.5*  PO2ART 337.0* 176.0* 152* 78.0* 90.6  HCO3 32.8* 29.6* 28.9* 31.9* 35.5*  TCO2 36 32  --  34  --   O2SAT 100.0 99.0 98.2 93.0 96.3   CBC  Recent  Labs Lab 08/19/16 0221 08/20/16 0250 08/21/16 0400  HGB 13.6 14.0 12.5*  HCT 40.4 42.4 39.3  WBC 13.6* 13.2* 9.5  PLT 216 219 257   COAGULATION  Recent Labs Lab 08/14/16 1352 08/18/16 0309  INR 1.06 1.20   CARDIAC   Recent Labs Lab 08/19/16 1057 08/19/16 1900 08/20/16 0250  TROPONINI <0.03 <0.03 0.07*   No results for input(s): PROBNP in the last 168 hours.  CHEMISTRY  Recent Labs Lab 08/14/16 1352 08/17/16 2217 08/19/16 0221 08/20/16 0250 08/21/16 0400  NA 137 134* 137 141 144  K 4.1 3.5 3.7 4.4 4.5  CL 103 101 107 104 104  CO2 25 23 22 26 31   GLUCOSE 92 151* 144* 215* 224*  BUN 6 8 15  24* 31*  CREATININE 0.88 0.85 0.72 1.13 0.93  CALCIUM 9.5 8.4* 8.3* 8.3* 8.2*  MG  --   --   --   --  2.7*  PHOS  --   --   --   --  4.0   Estimated Creatinine Clearance: 99.5 mL/min (by C-G formula based on SCr of 0.93 mg/dL).  LIVER  Recent Labs Lab 08/14/16 1352 08/18/16 0309  AST 21  --   ALT 15*  --   ALKPHOS 50  --   BILITOT 0.6  --   PROT 8.1  --   ALBUMIN 3.8  --   INR 1.06 1.20   INFECTIOUS  Recent Labs Lab 08/18/16 0218 08/18/16 0309 08/18/16 0844 08/19/16 1057  LATICACIDVEN 1.6  --  2.4* 1.8  PROCALCITON  --  0.34  --   --    ENDOCRINE CBG (last 3)   Recent Labs  08/20/16 2320 08/21/16 0319 08/21/16 0846  GLUCAP 221* 191* 204*   IMAGING x48h  - image(s) personally visualized  -   highlighted in bold Dg Chest Port 1 View  Result Date: 08/21/2016 CLINICAL DATA:  Status post intubation EXAM: PORTABLE CHEST 1 VIEW COMPARISON:  08/20/2016 FINDINGS: Endotracheal tube, nasogastric catheter and right jugular central line are again seen and stable. Cardiac shadow is within normal limits. Diffuse interstitial changes are again identified and stable. No new focal infiltrates are seen. IMPRESSION: No significant interval change from the previous day. Electronically Signed   By: Alcide Clever M.D.   On: 08/21/2016 07:40   Dg Chest Port 1  View  Result Date: 08/20/2016 CLINICAL DATA:  Central line placement EXAM: PORTABLE CHEST 1 VIEW COMPARISON:  08/20/2016 FINDINGS: Bilateral reticular interstitial prominence again noted. Stable endotracheal and NG tube position. There is right IJ central line with tip in SVC. No pneumothorax. IMPRESSION: Stable endotracheal and NG tube position. Right IJ central line with tip in SVC. No pneumothorax. Electronically Signed   By: Natasha Mead M.D.   On: 08/20/2016 12:05   Dg Chest Port 1 View  Result Date: 08/20/2016 CLINICAL DATA:  Endotracheal and orogastric tube placement. History of pulmonary fibrosis, COPD and pneumonia. EXAM: PORTABLE CHEST 1 VIEW COMPARISON:  Chest radiograph August 17, 2016 and CT chest August 17, 2016 FINDINGS: Endotracheal tube tip projects 5 cm above the carina. Nasogastric tube past proximal stomach, distal tip not imaged. Cardiac silhouette  is mildly enlarged, mediastinal silhouette is nonsuspicious. Diffuse interstitial prominence with faint lower lung zone airspace opacities. No pleural effusion. No pneumothorax. Suture anchors RIGHT humeral head. Soft tissue planes and included osseous structure nonsuspicious. IMPRESSION: Endotracheal tube tip projects 5 cm above the carina. Nasogastric tube past proximal stomach. Diffuse interstitial prominence, with hazy densities in lower lung zones compatible with atypical infection and, fibrosis. Electronically Signed   By: Awilda Metroourtnay  Bloomer M.D.   On: 08/20/2016 00:44   ASSESSMENT / PLAN:  PULMONARY A: #Baseline - Chronic smoker - September 2016 with isolated reduction in diffusion capacity associated with emphysema and non-UIP pattern of interstitial lung disease - not otherwise specified. No known ILD exposures per hx  #Admit 08/17/2016 - Abrupt respiratory illness with acute hypoxemic respiratory failure with significant deterioration 3 days after right shoulder surgery. Pulmonary embolism ruled out. Diffuse ground glass  opacities on CT chest at admission  - Differential diagnosis includes: Undiagnosed IPF flare, acute lung injury postop idiopathic, Boop, acute eosinophilic pneumonia and viral induced ARDS (less likely as repeat negative)  #Current 08/21/16  - severe ARDS on nimbex, and deep sedation . On 40% fio2, peep 8.   P:   ARDS NET protocol with nimbex since 08/20/16 Decrease FiO2 to 40%, Plat of 23. BAL with lavage when more stable Cotinue abx for now Continue steroids Ipratroprium and xopenex q6h   CARDIOVASCULAR A:  MI ruled out but troponin increased to 0.07. Suspect demand. ECHO ok 10/22 - started on levophed  P:  Tele monitor Active diureses  RENAL A:   At risk for lactic acidosis and renal injury P:   Monitor Lasix 40 mg IV q6 x3 doses. KVO IVF Start TF BMET in AM Replace electrolytes as intubated.  GASTROINTESTINAL A:   At risk for aspiration with intubation P:   Except meds TF per nutrition.  HEMATOLOGIC A:   At risk for anemia of critical illness P:  Monitor DVT prophylaxis  INFECTIOUS A:   Unclear if this is community by pneumonia P:   Repeat RVP negative Check pro calcitonin --> 0.34 Await urine Legionella and streptococcal antigen Broaden antibiotic therapy >> vanc and zosyn  ENDOCRINE A:   At risk for hyperglycemia   P:   ICU hyperglycemia protocol Solumedrol 60 q6h (day 4)  NEUROLOGIC A:   At risk for delirium P:   Monitor RASS goal: -5 with bIS goal < 50 Fent/versed gtt with short term nimbex  FAMILY  - Updates: Family updated bedside, plan on active diureses today and stopping paralytics tomorrow if PF ratio continues to improve.  - Inter-disciplinary family meet or Palliative Care meeting due by: 08/26/2016 - done 08/20/16  The patient is critically ill with multiple organ systems failure and requires high complexity decision making for assessment and support, frequent evaluation and titration of therapies, application of advanced  monitoring technologies and extensive interpretation of multiple databases.   Critical Care Time devoted to patient care services described in this note is  35  Minutes. This time reflects time of care of this signee Dr Koren BoundWesam Raidyn Breiner. This critical care time does not reflect procedure time, or teaching time or supervisory time of PA/NP/Med student/Med Resident etc but could involve care discussion time.  Alyson ReedyWesam G. Tyshell Ramberg, M.D. [facility] Pulmonary/Critical Care Medicine. Pager: [phone]. After hours pager: [phone].

## 2016-08-21 NOTE — Progress Notes (Signed)
PULMONARY / CRITICAL CARE MEDICINE   Name: Bradley [name] MRN: [id number] DOB: [birth year]    ADMISSION DATE:  08/17/2016 CONSULTATION DATE:  08/19/16  REFERRING MD:  Dr Thayer Ohm Rama  CHIEF COMPLAINT:  Acute resp failure with hypoxemia  BRIEF 55 year old smoker, with PFT nov 2016 showing isolated reduction in dlco to 59% and quite functional and not on home o2. Followed by Dr Sherene Sires -0 last seen Sept/Nov 2016 based on CT chest evidence showing diffuse changes of centrilobular and paraseptal emphysema along with prominent interstitial markings primarily in the upper lobes consistent with pulmonary fibrosis. This was a non--UIP pattern. Patient recommended quitting smoking because of concerns of DIP. According to the patient and his wife, has continued to smoke. He has been in his baseline status of health and very functional. Then on 08/14/2016 he had a chest x-ray preoperative that only showed chronic changes without change for a right shoulder surgery rotator cuff tear. The procedure was uneventful. Postoperative course was uneventful. There was no vomiting. Then abruptly on 08/16/2016 he started getting acutely ill with shortness of breath and cough with brown colored sputum. Then admitted to the hospital. Initial white count was 13,000. BNP was normal. CT angiogram chest ruled out pulmonary embolism but showed diffuse ground glass opacities and enlarged mediastinal node measuring close to 2 cm. He was started on community acquired pneumonia antibiotics but is developed progressive acute respiratory failure necessitating Solu-Medrol since 08/18/2016 evening. And from the early hours of 08/19/2016 he's been on BiPAP with significant respiratory distress and therefore pulmonary critical care has been consulted  At baseline he denies any mold exposure or mildew exposure or working with dust. He denies any birds in the house. He continues to smoke. He has been afebrile in the  hospital   Events: 08/17/2016 - admit 08/18/2016-respiratory virus panel, HIV and flu panel PCR negative 08/19/16 -bipap, move to icu -> intubated 10/21 10/22 repeat RVP from trach aspirate - negative   SUBJECTIVE/OVERNIGHT/INTERVAL HX 08/20/16 -> Intubated overnight (DIFFICULT). On ARDS protocol with nimbex now. ECHO: LVEF 60-65%. normal wall thickness, normal wall motion, normal LA size, moderate TR, RVSP 56 mmHg (moderate pulmonary hypertension), estimated RA pressure of 8 mmHg.   VITAL SIGNS: BP (!) 123/57   Pulse 66   Temp 97.8 F (36.6 C) (Axillary)   Resp (!) 35   Ht 5\' 9"  (1.753 m)   Wt 198 lb 3.1 oz (89.9 kg)   SpO2 95%   BMI 29.27 kg/m   HEMODYNAMICS:    VENTILATOR SETTINGS: Vent Mode: PRVC FiO2 (%):  [50 %-70 %] 50 % Set Rate:  [35 bmp] 35 bmp Vt Set:  [440 mL] 440 mL PEEP:  [10 cmH20-12 cmH20] 10 cmH20 Plateau Pressure:  [23 cmH20-29 cmH20] 24 cmH20  INTAKE / OUTPUT: I/O last 3 completed shifts: In: 2457.3 [I.V.:1677.3; Other:30; IV Piggyback:750] Out: 3500 [Urine:3450; Emesis/NG output:50]   Lines: Foley 10/21 >> ETT and OG 10/21 >> CVC 10/22 >> PIV x 3 A-line left radial 10/22 >>  PHYSICAL EXAMINATION: General:  Middle-aged, sedated ETT in place Neuro: Sedated and paralyzed  HEENT:  ETT in place Cardiovascular:  Regular rate and rhythm Lungs:  Respiratory rate around 35, synchronous with vent Abdomen:  Soft nontender Musculoskeletal:  No cyanosis no clubbing no edema. Looks euvolemic; clean dry bandage over R shoulder Skin:  Intact on exposed areas  LABS: PULMONARY  Recent Labs Lab 08/19/16 0415 08/20/16 0042 08/20/16 0318 08/20/16 0501 08/20/16 1143  PHART 7.425 7.174*  7.197* 7.236* 7.275*  PCO2ART 35.7 88.2* 75.6* 70.0* 68.4*  PO2ART 56.8* 337.0* 176.0* 152* 78.0*  HCO3 23.1 32.8* 29.6* 28.9* 31.9*  TCO2  --  36 32  --  34  O2SAT 88.3 100.0 99.0 98.2 93.0    CBC  Recent Labs Lab 08/19/16 0221 08/20/16 0250  08/21/16 0400  HGB 13.6 14.0 12.5*  HCT 40.4 42.4 39.3  WBC 13.6* 13.2* 9.5  PLT 216 219 257    COAGULATION  Recent Labs Lab 08/14/16 1352 08/18/16 0309  INR 1.06 1.20    CARDIAC    Recent Labs Lab 08/19/16 1057 08/19/16 1900 08/20/16 0250  TROPONINI <0.03 <0.03 0.07*   No results for input(s): PROBNP in the last 168 hours.   CHEMISTRY  Recent Labs Lab 08/14/16 1352 08/17/16 2217 08/19/16 0221 08/20/16 0250 08/21/16 0400  NA 137 134* 137 141 144  K 4.1 3.5 3.7 4.4 4.5  CL 103 101 107 104 104  CO2 25 23 22 26 31   GLUCOSE 92 151* 144* 215* 224*  BUN 6 8 15  24* 31*  CREATININE 0.88 0.85 0.72 1.13 0.93  CALCIUM 9.5 8.4* 8.3* 8.3* 8.2*  MG  --   --   --   --  2.7*  PHOS  --   --   --   --  4.0   Estimated Creatinine Clearance: 99.5 mL/min (by C-G formula based on SCr of 0.93 mg/dL).   LIVER  Recent Labs Lab 08/14/16 1352 08/18/16 0309  AST 21  --   ALT 15*  --   ALKPHOS 50  --   BILITOT 0.6  --   PROT 8.1  --   ALBUMIN 3.8  --   INR 1.06 1.20     INFECTIOUS  Recent Labs Lab 08/18/16 0218 08/18/16 0309 08/18/16 0844 08/19/16 1057  LATICACIDVEN 1.6  --  2.4* 1.8  PROCALCITON  --  0.34  --   --      ENDOCRINE CBG (last 3)   Recent Labs  08/20/16 1939 08/20/16 2320 08/21/16 0319  GLUCAP 188* 221* 191*     IMAGING x48h  - image(s) personally visualized  -   highlighted in bold Dg Chest Port 1 View  Result Date: 08/20/2016 CLINICAL DATA:  Central line placement EXAM: PORTABLE CHEST 1 VIEW COMPARISON:  08/20/2016 FINDINGS: Bilateral reticular interstitial prominence again noted. Stable endotracheal and NG tube position. There is right IJ central line with tip in SVC. No pneumothorax. IMPRESSION: Stable endotracheal and NG tube position. Right IJ central line with tip in SVC. No pneumothorax. Electronically Signed   By: Natasha MeadLiviu  Pop M.D.   On: 08/20/2016 12:05   Dg Chest Port 1 View  Result Date: 08/20/2016 CLINICAL DATA:   Endotracheal and orogastric tube placement. History of pulmonary fibrosis, COPD and pneumonia. EXAM: PORTABLE CHEST 1 VIEW COMPARISON:  Chest radiograph August 17, 2016 and CT chest August 17, 2016 FINDINGS: Endotracheal tube tip projects 5 cm above the carina. Nasogastric tube past proximal stomach, distal tip not imaged. Cardiac silhouette is mildly enlarged, mediastinal silhouette is nonsuspicious. Diffuse interstitial prominence with faint lower lung zone airspace opacities. No pleural effusion. No pneumothorax. Suture anchors RIGHT humeral head. Soft tissue planes and included osseous structure nonsuspicious. IMPRESSION: Endotracheal tube tip projects 5 cm above the carina. Nasogastric tube past proximal stomach. Diffuse interstitial prominence, with hazy densities in lower lung zones compatible with atypical infection and, fibrosis. Electronically Signed   By: Awilda Metroourtnay  Bloomer M.D.   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 No known ILD exposures per hx  #Admit 08/17/2016 - Abrupt respiratory illness with acute hypoxemic respiratory failure with significant deterioration 3 days after right shoulder surgery. Pulmonary embolism ruled out. Diffuse ground glass opacities on CT chest at admission  - Differential diagnosis includes: Undiagnosed IPF flare, acute lung injury postop idiopathic, Boop, acute eosinophilic pneumonia and viral induced ARDS (less likely as repeat negative)  #Current 08/21/16  - severe ARDS on nimbex, and deep sedation . On 40% fio2, peep 8.   P:   ARDS NET protocol with nimbex since 08/20/16 Consider prone ventilation BAL with lavage when more stable Cotinue abx for now Continue steroids Check autoimmune and vasculitis - pending Ipratroprium and xopenex q6h     CARDIOVASCULAR A:  MI ruled out but troponin increased to 0.07. Suspect demand. ECHO ok 10/22 - started on levophed  P:  monitor   RENAL A:   At risk for lactic acidosis and renal injury P:   Monitor  GASTROINTESTINAL A:   At risk for aspiration with intubation P:   Npo. Except meds Consider starting TFs  HEMATOLOGIC A:   At risk for anemia of critical illness P:  Monitor DVT prophylaxis  INFECTIOUS A:   Unclear if this is community by pneumonia P:   Check repeat RVP Check pro calcitonin --> 0.34 Await urine Legionella and streptococcal antigen ? Broaden antibiotic therapy >> vanc and zosyn  ENDOCRINE A:   At risk for hyperglycemia   P:   ICU hyperglycemia protocol Solumedrol 60 q6h (day 4)  NEUROLOGIC A:   At risk for delirium P:   Monitor RASS goal: -5 with bIS goal < 50 Fent/versed gtt with short term nimbex  FAMILY  - Updates: on 08/19/16 Wife and pt extensively updated at the bedside at stepdown unit. He'll be moved to the ICU. High risk for intubation. Prognosis and course uncertain and fraught with high mortality and morbidity.   - Inter-disciplinary family meet or Palliative Care meeting due by: 08/26/2016 - done 08/20/16   [facility], PGY-2 08/21/2016, 6:55 AM  Alyson Reedy, M.D. [facility] Pulmonary/Critical Care Medicine. Pager: [phone]. After hours pager: [phone].

## 2016-08-21 NOTE — Progress Notes (Addendum)
Initial Nutrition Assessment  DOCUMENTATION CODES:   Not applicable  INTERVENTION:    Initiate TF via OGT with Vital AF 1.2 at 15 ml/h, increase by 10 ml/h every 4 hours to goal rate of 75 ml/h (1800 ml per day) to provide 2160 kcals, 135 gm protein, 1460 ml free water daily.  NUTRITION DIAGNOSIS:   Inadequate oral intake related to inability to eat as evidenced by NPO status.  GOAL:   Patient will meet greater than or equal to 90% of their needs  MONITOR:   Vent status, Labs, Weight trends, TF tolerance  REASON FOR ASSESSMENT:   Consult Enteral/tube feeding initiation and management  ASSESSMENT:   55 y.o. male with medical history significant of COPD, asthma, pulmonary fibrosis, depression, anxiety, kidney stone, tobacco abuse, who presents with cough and shortness of breath: dx with PNA.  Patient is currently intubated on ventilator support MV: 15.7 L/min Temp (24hrs), Avg:98.2 F (36.8 C), Min:97.5 F (36.4 C), Max:99 F (37.2 C)  Currently on ARDS protocol, receiving Nimbex. OGT in place. Received MD Consult for TF initiation and management. Per discussion with RN, physician would like slow advancement of TF due to Nimbex. Labs reviewed.  Medications reviewed and include Lasix and Solumedrol. Nutrition focused physical exam completed.  No muscle or subcutaneous fat depletion noticed. Spoke with patient's family at bedside, who report patient had no nutrition issues PTA, he was eating well and has not lost weight.  Diet Order:  Diet NPO time specified Except for: Sips with Meds  Skin:  Reviewed, no issues  Last BM:  10/21  Height:   Ht Readings from Last 1 Encounters:  08/18/16 5\' 9"  (1.753 m)    Weight:   Wt Readings from Last 1 Encounters:  08/18/16 198 lb 3.1 oz (89.9 kg)    Ideal Body Weight:  72.7 kg  BMI:  Body mass index is 29.27 kg/m.  Estimated Nutritional Needs:   Kcal:  2146  Protein:  125-140 gm  Fluid:  2.1 L  EDUCATION  NEEDS:   No education needs identified at this time  Joaquin CourtsKimberly Glennie Bose, RD, LDN, CNSC Pager [phone] After Hours Pager [phone]

## 2016-08-22 ENCOUNTER — Inpatient Hospital Stay (HOSPITAL_COMMUNITY): Payer: BLUE CROSS/BLUE SHIELD

## 2016-08-22 ENCOUNTER — Telehealth (INDEPENDENT_AMBULATORY_CARE_PROVIDER_SITE_OTHER): Payer: Self-pay | Admitting: Orthopaedic Surgery

## 2016-08-22 LAB — BASIC METABOLIC PANEL
Anion gap: 7 (ref 5–15)
BUN: 41 mg/dL — AB (ref 6–20)
CHLORIDE: 103 mmol/L (ref 101–111)
CO2: 37 mmol/L — ABNORMAL HIGH (ref 22–32)
Calcium: 8.5 mg/dL — ABNORMAL LOW (ref 8.9–10.3)
Creatinine, Ser: 1.02 mg/dL (ref 0.61–1.24)
Glucose, Bld: 162 mg/dL — ABNORMAL HIGH (ref 65–99)
POTASSIUM: 4.6 mmol/L (ref 3.5–5.1)
SODIUM: 147 mmol/L — AB (ref 135–145)

## 2016-08-22 LAB — BLOOD GAS, ARTERIAL
Acid-Base Excess: 14 mmol/L — ABNORMAL HIGH (ref 0.0–2.0)
BICARBONATE: 39.4 mmol/L — AB (ref 20.0–28.0)
DRAWN BY: 345601
FIO2: 0.4
LHR: 35 {breaths}/min
O2 Saturation: 95.3 %
PEEP: 10 cmH2O
PH ART: 7.406 (ref 7.350–7.450)
Patient temperature: 98.4
VT: 440 mL
pCO2 arterial: 63.9 mmHg — ABNORMAL HIGH (ref 32.0–48.0)
pO2, Arterial: 82 mmHg — ABNORMAL LOW (ref 83.0–108.0)

## 2016-08-22 LAB — CBC WITH DIFFERENTIAL/PLATELET
BASOS ABS: 0 10*3/uL (ref 0.0–0.1)
BASOS PCT: 0 %
EOS ABS: 0 10*3/uL (ref 0.0–0.7)
EOS PCT: 0 %
HCT: 39.5 % (ref 39.0–52.0)
HEMOGLOBIN: 12.3 g/dL — AB (ref 13.0–17.0)
LYMPHS ABS: 0.6 10*3/uL — AB (ref 0.7–4.0)
Lymphocytes Relative: 6 %
MCH: 29.8 pg (ref 26.0–34.0)
MCHC: 31.1 g/dL (ref 30.0–36.0)
MCV: 95.6 fL (ref 78.0–100.0)
Monocytes Absolute: 0.8 10*3/uL (ref 0.1–1.0)
Monocytes Relative: 8 %
NEUTROS PCT: 86 %
Neutro Abs: 9.1 10*3/uL — ABNORMAL HIGH (ref 1.7–7.7)
PLATELETS: 207 10*3/uL (ref 150–400)
RBC: 4.13 MIL/uL — AB (ref 4.22–5.81)
RDW: 13.7 % (ref 11.5–15.5)
WBC: 10.5 10*3/uL (ref 4.0–10.5)

## 2016-08-22 LAB — SJOGRENS SYNDROME-B EXTRACTABLE NUCLEAR ANTIBODY: SSB (La) (ENA) Antibody, IgG: <0.2 AI (ref 0.0–0.9)

## 2016-08-22 LAB — CULTURE, RESPIRATORY
CULTURE: NORMAL
SPECIAL REQUESTS: NORMAL

## 2016-08-22 LAB — GLUCOSE, CAPILLARY
GLUCOSE-CAPILLARY: 168 mg/dL — AB (ref 65–99)
GLUCOSE-CAPILLARY: 195 mg/dL — AB (ref 65–99)
GLUCOSE-CAPILLARY: 190 mg/dL — AB (ref 65–99)
Glucose-Capillary: 174 mg/dL — ABNORMAL HIGH (ref 65–99)
Glucose-Capillary: 200 mg/dL — ABNORMAL HIGH (ref 65–99)
Glucose-Capillary: 215 mg/dL — ABNORMAL HIGH (ref 65–99)

## 2016-08-22 LAB — CULTURE, RESPIRATORY W GRAM STAIN

## 2016-08-22 LAB — SJOGRENS SYNDROME-A EXTRACTABLE NUCLEAR ANTIBODY: SSA (Ro) (ENA) Antibody, IgG: <0.2 AI (ref 0.0–0.9)

## 2016-08-22 LAB — MAGNESIUM: MAGNESIUM: 3.1 mg/dL — AB (ref 1.7–2.4)

## 2016-08-22 LAB — LEGIONELLA PNEUMOPHILA SEROGP 1 UR AG: L. PNEUMOPHILA SEROGP 1 UR AG: NEGATIVE

## 2016-08-22 LAB — PHOSPHORUS: PHOSPHORUS: 3.7 mg/dL (ref 2.5–4.6)

## 2016-08-22 MED ORDER — MAGNESIUM SULFATE 4 GM/100ML IV SOLN
4.0000 g | Freq: Once | INTRAVENOUS | Status: DC
  Filled 2016-08-22: qty 100

## 2016-08-22 MED ORDER — ACETAZOLAMIDE SODIUM 500 MG IJ SOLR
250.0000 mg | Freq: Four times a day (QID) | INTRAMUSCULAR | Status: AC
  Administered 2016-08-22 (×3): 250 mg via INTRAVENOUS
  Filled 2016-08-22 (×3): qty 250

## 2016-08-22 MED ORDER — FUROSEMIDE 10 MG/ML IJ SOLN
40.0000 mg | Freq: Four times a day (QID) | INTRAMUSCULAR | Status: AC
  Administered 2016-08-22 (×3): 40 mg via INTRAVENOUS
  Filled 2016-08-22 (×3): qty 4

## 2016-08-22 MED ORDER — FREE WATER
250.0000 mL | Status: DC
  Administered 2016-08-22 – 2016-08-23 (×8): 250 mL

## 2016-08-22 NOTE — Progress Notes (Signed)
RT note- assisted with Dr. Molli KnockYacoub with bronchoscopy for ETT placement. Secured at 23 cm.

## 2016-08-22 NOTE — Telephone Encounter (Signed)
Wife called.  Pt was scheduled to come in on 08-13-16.  Is in [facility] ICU. 452m3.  Wanted Dr Ophelia CharterYates to know.

## 2016-08-22 NOTE — Progress Notes (Signed)
PULMONARY / CRITICAL CARE MEDICINE   Name: Bradley [name] MRN: [id number] DOB: 05-11-1961    ADMISSION DATE:  08/17/2016 CONSULTATION DATE:  08/19/16  REFERRING MD:  Dr Thayer Ohm Rama  CHIEF COMPLAINT:  Acute resp failure with hypoxemia  BRIEF 55 year old smoker, with PFT nov 2016 showing isolated reduction in dlco to 59% and quite functional and not on home o2. Followed by Dr Sherene Sires -0 last seen Sept/Nov 2016 based on CT chest evidence showing diffuse changes of centrilobular and paraseptal emphysema along with prominent interstitial markings primarily in the upper lobes consistent with pulmonary fibrosis. This was a non--UIP pattern. Patient recommended quitting smoking because of concerns of DIP. According to the patient and his wife, has continued to smoke. He has been in his baseline status of health and very functional. Then on 08/14/2016 he had a chest x-ray preoperative that only showed chronic changes without change for a right shoulder surgery rotator cuff tear. The procedure was uneventful. Postoperative course was uneventful. There was no vomiting. Then abruptly on 08/16/2016 he started getting acutely ill with shortness of breath and cough with brown colored sputum. Then admitted to the hospital. Initial white count was 13,000. BNP was normal. CT angiogram chest ruled out pulmonary embolism but showed diffuse ground glass opacities and enlarged mediastinal node measuring close to 2 cm. He was started on community acquired pneumonia antibiotics but is developed progressive acute respiratory failure necessitating Solu-Medrol since 08/18/2016 evening. And from the early hours of 08/19/2016 he's been on BiPAP with significant respiratory distress and therefore pulmonary critical care has been consulted  At baseline he denies any mold exposure or mildew exposure or working with dust. He denies any birds in the house. He continues to smoke. He has been afebrile in the hospital  Past Medical  History:  Diagnosis Date  . Anxiety    Citalopram controls "panic attacks"  . Asthma   . COPD (chronic obstructive pulmonary disease) (HCC)   . Dyspnea    with exertion  . History of kidney stones    x3-4 - lithotripsy  . Pneumonia    08/11/16- 3 years ago  . Pulmonary fibrosis (HCC)     Events: 08/17/2016 - admit 08/18/2016-respiratory virus panel, HIV and flu panel PCR negative 08/19/16 -bipap, move to icu -> intubated 10/21 10/22 repeat RVP from trach aspirate - negative   SUBJECTIVE/OVERNIGHT/INTERVAL HX 08/20/16 -> Intubated overnight (DIFFICULT). On ARDS protocol with nimbex now. ECHO: LVEF 60-65%. normal wall thickness, normal wall motion, normal LA size, moderate TR, RVSP 56 mmHg (moderate pulmonary hypertension), estimated RA pressure of 8 mmHg. 10/23 --> improved settings on vent 10/24 --> P to F ratio improved to 205 with today's ABG; CVP 5; FiO2 40, PEEP 10, RR 35, TV 440; paralyzed with nimbex 2 mcg   VITAL SIGNS: BP 115/74 (BP Location: Right Arm)   Pulse 68   Temp 98.4 F (36.9 C) (Axillary)   Resp (!) 35   Ht 5\' 9"  (1.753 m)   Wt 198 lb 3.1 oz (89.9 kg)   SpO2 91%   BMI 29.27 kg/m   HEMODYNAMICS: CVP:  [5 mmHg-10 mmHg] 5 mmHg  VENTILATOR SETTINGS: Vent Mode: PRVC FiO2 (%):  [40 %] 40 % Set Rate:  [35 bmp] 35 bmp Vt Set:  [440 mL] 440 mL PEEP:  [10 cmH20] 10 cmH20 Plateau Pressure:  [25 cmH20-26 cmH20] 26 cmH20  INTAKE / OUTPUT: I/O last 3 completed shifts: In: 3707.8 [I.V.:1908.1; NG/GT:474.7; IV Piggyback:1325] Out: 4098 [JXBJY:7829;  Emesis/NG output:140]   Lines: Foley 10/21 >> ETT and OG 10/21 >> CVC 10/22 >> PIV x 3 A-line left radial 10/22 >>  PHYSICAL EXAMINATION: General:  Middle-aged, sedated ETT in place Neuro: Sedated and paralyzed  HEENT:  ETT in place Cardiovascular:  Regular rate and rhythm Lungs:  Respiratory rate around 35, synchronous with vent Abdomen:  Soft nontender Musculoskeletal:  No cyanosis no clubbing no  edema. Looks euvolemic; clean dry bandage over R shoulder Skin:  Intact on exposed areas  LABS: PULMONARY  Recent Labs Lab 08/20/16 0042 08/20/16 0318 08/20/16 0501 08/20/16 1143 08/21/16 0855 08/22/16 0409  PHART 7.174* 7.197* 7.236* 7.275* 7.352 7.406  PCO2ART 88.2* 75.6* 70.0* 68.4* 65.5* 63.9*  PO2ART 337.0* 176.0* 152* 78.0* 90.6 82.0*  HCO3 32.8* 29.6* 28.9* 31.9* 35.5* 39.4*  TCO2 36 32  --  34  --   --   O2SAT 100.0 99.0 98.2 93.0 96.3 95.3    CBC  Recent Labs Lab 08/20/16 0250 08/21/16 0400 08/22/16 0440  HGB 14.0 12.5* 12.3*  HCT 42.4 39.3 39.5  WBC 13.2* 9.5 10.5  PLT 219 257 207    COAGULATION  Recent Labs Lab 08/18/16 0309  INR 1.20    CARDIAC    Recent Labs Lab 08/19/16 1057 08/19/16 1900 08/20/16 0250  TROPONINI <0.03 <0.03 0.07*   No results for input(s): PROBNP in the last 168 hours.   CHEMISTRY  Recent Labs Lab 08/17/16 2217 08/19/16 0221 08/20/16 0250 08/21/16 0400 08/22/16 0440  NA 134* 137 141 144 147*  K 3.5 3.7 4.4 4.5 4.6  CL 101 107 104 104 103  CO2 23 22 26 31  37*  GLUCOSE 151* 144* 215* 224* 162*  BUN 8 15 24* 31* 41*  CREATININE 0.85 0.72 1.13 0.93 1.02  CALCIUM 8.4* 8.3* 8.3* 8.2* 8.5*  MG  --   --   --  2.7* 3.1*  PHOS  --   --   --  4.0 3.7   Estimated Creatinine Clearance: 90.7 mL/min (by C-G formula based on SCr of 1.02 mg/dL).   LIVER  Recent Labs Lab 08/18/16 0309  INR 1.20     INFECTIOUS  Recent Labs Lab 08/18/16 0218 08/18/16 0309 08/18/16 0844 08/19/16 1057  LATICACIDVEN 1.6  --  2.4* 1.8  PROCALCITON  --  0.34  --   --      ENDOCRINE CBG (last 3)   Recent Labs  08/21/16 2335 08/22/16 0342 08/22/16 0731  GLUCAP 215* 168* 195*     IMAGING x48h  - image(s) personally visualized  -   highlighted in bold Dg Chest Port 1 View  Result Date: 08/22/2016 CLINICAL DATA:  Respiratory difficulty EXAM: PORTABLE CHEST 1 VIEW COMPARISON:  Yesterday FINDINGS: Endotracheal tube,  NG tube, right jugular venous catheter are stable. Heterogeneous opacities throughout both lungs have slightly improved. It remains left greater than right. No pneumothorax. No pleural effusion. Normal heart size. IMPRESSION: Improved bilateral pulmonary parenchymal disease suggesting improved ARDS or pneumonia. Electronically Signed   By: Jolaine Click M.D.   On: 08/22/2016 07:48   Dg Chest Port 1 View  Result Date: 08/21/2016 CLINICAL DATA:  Status post intubation EXAM: PORTABLE CHEST 1 VIEW COMPARISON:  08/20/2016 FINDINGS: Endotracheal tube, nasogastric catheter and right jugular central line are again seen and stable. Cardiac shadow is within normal limits. Diffuse interstitial changes are again identified and stable. No new focal infiltrates are seen. IMPRESSION: No significant interval change from the previous day. Electronically Signed   By:  Alcide CleverMark  Lukens M.D.   On: 08/21/2016 07:40   Dg Chest Port 1 View  Result Date: 08/20/2016 CLINICAL DATA:  Central line placement EXAM: PORTABLE CHEST 1 VIEW COMPARISON:  08/20/2016 FINDINGS: Bilateral reticular interstitial prominence again noted. Stable endotracheal and NG tube position. There is right IJ central line with tip in SVC. No pneumothorax. IMPRESSION: Stable endotracheal and NG tube position. Right IJ central line with tip in SVC. No pneumothorax. Electronically Signed   By: Natasha MeadLiviu  Pop M.D.   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 No known ILD exposures per hx  #Admit 08/17/2016 - Abrupt respiratory illness with acute hypoxemic respiratory failure with significant deterioration 3 days after right shoulder surgery. Pulmonary embolism ruled out. Diffuse ground glass opacities on CT chest at admission  - Differential diagnosis  includes: Undiagnosed IPF flare, acute lung injury postop idiopathic, Boop, acute eosinophilic pneumonia and viral induced ARDS (less likely as repeat negative)  #Current 08/21/16  - improving ARDS on nimbex, and deep sedation . On 40% fio2, peep 10.   P:   ARDS NET protocol with nimbex since 08/20/16 BAL with lavage when more stable Cotinue abx for now Continue steroids (start to wean?) Check autoimmune and vasculitis - pending; RF mildly elevated at 17.6 Ipratroprium and xopenex q6h    CARDIOVASCULAR A:  MI ruled out but troponin increased to 0.07. Suspect demand. ECHO ok 10/22 - started on levophed, discontinued 10/23  P:  Monitor Obtain q6h CVPs   RENAL A:   At risk for lactic acidosis and renal injury P:   Monitor  GASTROINTESTINAL A:   At risk for aspiration with intubation P:   Npo. Except meds TFs per nutrition  HEMATOLOGIC A:   At risk for anemia of critical illness P:  Monitor - stable DVT prophylaxis - lovenox  INFECTIOUS A:   Unclear if this is community acquired pneumonia P:   Check pro calcitonin --> 0.34 Await urine Legionella and streptococcal antigen Continue broad antibiotic therapy >> vanc and zosyn (Day 4); consider discontinuation  ENDOCRINE A:   At risk for hyperglycemia   P:   ICU hyperglycemia protocol Solumedrol 60 q6h (day 5)  NEUROLOGIC A:   At risk for delirium P:   Monitor RASS goal: -5 with bIS goal < 50 Fent/versed gtt with short term nimbex  FAMILY  - Updates: on 08/19/16 Wife and pt extensively updated at the bedside at stepdown unit.   - Inter-disciplinary family meet or Palliative Care meeting due by: 08/26/2016 - done 08/20/16   Hillary M Betsey AmenFitzgerald  [facility], PGY-2 08/22/2016, 8:31 AM  Alyson ReedyWesam G. Yacoub, M.D. [facility]monary/Critical Care Medicine. Pager: [phone]. After hours pager: [phone].

## 2016-08-22 NOTE — Procedures (Signed)
Bradley [name] Bradley [name] [id number] [birth year]  Procedure: Bradley Indications: Diagnostic evaluation of the airways and Remove secretions  Procedure Details Consent: Risks of procedure as well as the alternatives and risks of each were explained to the (patient/caregiver).  Consent for procedure obtained. Time Out: Verified patient identification, verified procedure, site/side was marked, verified correct patient position, special equipment/implants available, medications/allergies/relevent history reviewed, required imaging and test results available.  Performed  In preparation for procedure, patient was given 100% FiO2 and bronchoscope lubricated. Sedation: Benzodiazepines and Narcotics  Airway entered and the following bronchi were examined: RUL, RML, RLL, LUL, LLL and Bronchi.   Bronchoscope removed.  , Patient placed back on 100% FiO2 at conclusion of procedure.    Evaluation Hemodynamic Status: BP stable throughout; O2 sats: stable throughout Patient's Current Condition: stable Specimens:  None Complications: No apparent complications Patient did tolerate procedure well.   Bradley [name],Bradley [name] 08/22/2016

## 2016-08-22 NOTE — Progress Notes (Signed)
Patient ID: Bradley FraiseDavid M Neal, male   DOB: 1961/07/15, 55 y.o.   MRN: [id number] Wife called my office about followup visit after shoulder arthroscopy. I added my name as consult so he will show on my list. Intubated. Thank you for his care.

## 2016-08-22 NOTE — Progress Notes (Signed)
PULMONARY / CRITICAL CARE MEDICINE   Name: Bradley [name] MRN: [id number] DOB: Feb 10, 1961    ADMISSION DATE:  08/17/2016 CONSULTATION DATE:  08/19/16  REFERRING MD:  Dr Thayer Ohm Rama  CHIEF COMPLAINT:  Acute resp failure with hypoxemia  BRIEF 55 year old smoker, with PFT nov 2016 showing isolated reduction in dlco to 59% and quite functional and not on home o2. Followed by Dr Sherene Sires -0 last seen Sept/Nov 2016 based on CT chest evidence showing diffuse changes of centrilobular and paraseptal emphysema along with prominent interstitial markings primarily in the upper lobes consistent with pulmonary fibrosis. This was a non--UIP pattern. Patient recommended quitting smoking because of concerns of DIP. According to the patient and his wife, has continued to smoke. He has been in his baseline status of health and very functional. Then on 08/14/2016 he had a chest x-ray preoperative that only showed chronic changes without change for a right shoulder surgery rotator cuff tear. The procedure was uneventful. Postoperative course was uneventful. There was no vomiting. Then abruptly on 08/16/2016 he started getting acutely ill with shortness of breath and cough with brown colored sputum. Then admitted to the hospital. Initial white count was 13,000. BNP was normal. CT angiogram chest ruled out pulmonary embolism but showed diffuse ground glass opacities and enlarged mediastinal node measuring close to 2 cm. He was started on community acquired pneumonia antibiotics but is developed progressive acute respiratory failure necessitating Solu-Medrol since 08/18/2016 evening. And from the early hours of 08/19/2016 he's been on BiPAP with significant respiratory distress and therefore pulmonary critical care has been consulted  At baseline he denies any mold exposure or mildew exposure or working with dust. He denies any birds in the house. He continues to smoke. He has been afebrile in the hospital  Events: 08/17/2016  - admit 08/18/2016-respiratory virus panel, HIV and flu panel PCR negative 08/19/16 -bipap, move to icu -> intubated 10/21 10/22 repeat RVP from trach aspirate - negative  SUBJECTIVE/OVERNIGHT/INTERVAL HX 08/20/16 -> Intubated overnight (DIFFICULT). On ARDS protocol with nimbex now. ECHO: LVEF 60-65%. normal wall thickness, normal wall motion, normal LA size, moderate TR, RVSP 56 mmHg (moderate pulmonary hypertension), estimated RA pressure of 8 mmHg. 10/23 --> improved settings on vent 10/24 --> P to F ratio improved to 205 with today's ABG; CVP 5; FiO2 40, PEEP 10, RR 35, TV 440; paralyzed with nimbex 2 mcg  VITAL SIGNS: BP 109/75   Pulse 73   Temp 98.4 F (36.9 C) (Axillary)   Resp (!) 35   Ht 5\' 9"  (1.753 m)   Wt 89.9 kg (198 lb 3.1 oz)   SpO2 96%   BMI 29.27 kg/m   HEMODYNAMICS: CVP:  [5 mmHg-10 mmHg] 5 mmHg  VENTILATOR SETTINGS: Vent Mode: PRVC FiO2 (%):  [40 %-50 %] 50 % Set Rate:  [35 bmp] 35 bmp Vt Set:  [440 mL] 440 mL PEEP:  [10 cmH20] 10 cmH20 Plateau Pressure:  [21 cmH20-26 cmH20] 21 cmH20  INTAKE / OUTPUT: I/O last 3 completed shifts: In: 3707.8 [I.V.:1908.1; NG/GT:474.7; IV Piggyback:1325] Out: 9562 [ZHYQM:5784; Emesis/NG output:140]   Lines: Foley 10/21 >> ETT and OG 10/21 >> CVC 10/22 >> PIV x 3 A-line left radial 10/22 >>  PHYSICAL EXAMINATION: General:  Middle-aged, sedated and paralyzed, ETT in place Neuro: Sedated and paralyzed  HEENT:  ETT in place Cardiovascular:  Regular rate and rhythm Lungs:  Coarse BS diffusely Abdomen:  Soft nontender Musculoskeletal:  No cyanosis no clubbing no edema. Looks euvolemic; clean dry  bandage over R shoulder Skin:  Intact on exposed areas  LABS: PULMONARY  Recent Labs Lab 08/20/16 0042 08/20/16 0318 08/20/16 0501 08/20/16 1143 08/21/16 0855 08/22/16 0409  PHART 7.174* 7.197* 7.236* 7.275* 7.352 7.406  PCO2ART 88.2* 75.6* 70.0* 68.4* 65.5* 63.9*  PO2ART 337.0* 176.0* 152* 78.0* 90.6 82.0*  HCO3  32.8* 29.6* 28.9* 31.9* 35.5* 39.4*  TCO2 36 32  --  34  --   --   O2SAT 100.0 99.0 98.2 93.0 96.3 95.3   CBC  Recent Labs Lab 08/20/16 0250 08/21/16 0400 08/22/16 0440  HGB 14.0 12.5* 12.3*  HCT 42.4 39.3 39.5  WBC 13.2* 9.5 10.5  PLT 219 257 207   COAGULATION  Recent Labs Lab 08/18/16 0309  INR 1.20   CARDIAC    Recent Labs Lab 08/19/16 1057 08/19/16 1900 08/20/16 0250  TROPONINI <0.03 <0.03 0.07*   No results for input(s): PROBNP in the last 168 hours.   CHEMISTRY  Recent Labs Lab 08/17/16 2217 08/19/16 0221 08/20/16 0250 08/21/16 0400 08/22/16 0440  NA 134* 137 141 144 147*  K 3.5 3.7 4.4 4.5 4.6  CL 101 107 104 104 103  CO2 23 22 26 31  37*  GLUCOSE 151* 144* 215* 224* 162*  BUN 8 15 24* 31* 41*  CREATININE 0.85 0.72 1.13 0.93 1.02  CALCIUM 8.4* 8.3* 8.3* 8.2* 8.5*  MG  --   --   --  2.7* 3.1*  PHOS  --   --   --  4.0 3.7   Estimated Creatinine Clearance: 90.7 mL/min (by C-G formula based on SCr of 1.02 mg/dL).  LIVER  Recent Labs Lab 08/18/16 0309  INR 1.20     INFECTIOUS  Recent Labs Lab 08/18/16 0218 08/18/16 0309 08/18/16 0844 08/19/16 1057  LATICACIDVEN 1.6  --  2.4* 1.8  PROCALCITON  --  0.34  --   --      ENDOCRINE CBG (last 3)   Recent Labs  08/21/16 2335 08/22/16 0342 08/22/16 0731  GLUCAP 215* 168* 195*     IMAGING x48h  - image(s) personally visualized  -   highlighted in bold Dg Chest Port 1 View  Result Date: 08/22/2016 CLINICAL DATA:  Respiratory difficulty EXAM: PORTABLE CHEST 1 VIEW COMPARISON:  Yesterday FINDINGS: Endotracheal tube, NG tube, right jugular venous catheter are stable. Heterogeneous opacities throughout both lungs have slightly improved. It remains left greater than right. No pneumothorax. No pleural effusion. Normal heart size. IMPRESSION: Improved bilateral pulmonary parenchymal disease suggesting improved ARDS or pneumonia. Electronically Signed   By: Jolaine ClickArthur  Hoss M.D.   On:  08/22/2016 07:48   Dg Chest Port 1 View  Result Date: 08/21/2016 CLINICAL DATA:  Status post intubation EXAM: PORTABLE CHEST 1 VIEW COMPARISON:  08/20/2016 FINDINGS: Endotracheal tube, nasogastric catheter and right jugular central line are again seen and stable. Cardiac shadow is within normal limits. Diffuse interstitial changes are again identified and stable. No new focal infiltrates are seen. IMPRESSION: No significant interval change from the previous day. Electronically Signed   By: Alcide CleverMark  Lukens M.D.   On: 08/21/2016 07:40   Dg Chest Port 1 View  Result Date: 08/20/2016 CLINICAL DATA:  Central line placement EXAM: PORTABLE CHEST 1 VIEW COMPARISON:  08/20/2016 FINDINGS: Bilateral reticular interstitial prominence again noted. Stable endotracheal and NG tube position. There is right IJ central line with tip in SVC. No pneumothorax. IMPRESSION: Stable endotracheal and NG tube position. Right IJ central line with tip in SVC. No pneumothorax. Electronically Signed  By: Natasha Mead M.D.   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 No known ILD exposures per hx  #Admit 08/17/2016 - Abrupt respiratory illness with acute hypoxemic respiratory failure with significant deterioration 3 days after right shoulder surgery. Pulmonary embolism ruled out. Diffuse ground glass opacities on CT chest at admission  - Differential diagnosis includes: Undiagnosed IPF flare, acute lung injury postop idiopathic, Boop, acute eosinophilic pneumonia and viral induced ARDS (less likely as repeat negative)  #Current 08/21/16  - improving ARDS on nimbex, and deep sedation . On 40% fio2, peep 10.   P:   ARDS NET protocol with nimbex since 08/20/16 BAL with lavage when more stable Cotinue abx for  now Continue steroids until extubated F/U autoimmune and vasculitis - pending; RF mildly elevated at 17.6 Ipratroprium and xopenex q6h  Diureses as ordered  CARDIOVASCULAR A:  MI ruled out but troponin increased to 0.07. Suspect demand. ECHO ok 10/22 - started on levophed, discontinued 10/23  P:  Monitor Obtain q6h CVPs  RENAL A:   At risk for lactic acidosis and renal injury P:   Lasix 40 mg IV q6 x3 doses. Diamox 250 mg IV q6 x3 doses. Free water 250 ml q3 hours Replace electrolytes as indicated KVO IVF BMET in AM.  GASTROINTESTINAL A:   At risk for aspiration with intubation P:   NPO except meds TFs per nutrition  HEMATOLOGIC A:   At risk for anemia of critical illness P:  Monitor - stable DVT prophylaxis - lovenox  INFECTIOUS A:   Unclear if this is community acquired pneumonia P:   Check pro calcitonin --> 0.34 Continue broad antibiotic therapy >> vanc and zosyn (Day 4); will narrow in AM  ENDOCRINE A:   At risk for hyperglycemia   P:   ICU hyperglycemia protocol Solumedrol 60 q6h (day 5)  NEUROLOGIC A:   At risk for delirium P:   Monitor RASS goal: -5 with bIS goal < 50 Fent/versed gtt with short term nimbex D/C nimbex  FAMILY  - Updates: Family updated bedside 10/24  - Inter-disciplinary family meet or Palliative Care meeting due by: 08/26/2016 - done 08/20/16  The patient is critically ill with multiple organ systems failure and requires high complexity decision making for assessment and support, frequent evaluation and titration of therapies, application of advanced monitoring technologies and extensive interpretation of multiple databases.   Critical Care Time devoted to patient care services described in this note is  35  Minutes. This time reflects time of care of this signee Dr Koren Bound. This critical care time does not reflect procedure time, or teaching time or supervisory time of PA/NP/Med student/Med Resident etc but could  involve care discussion time.  Alyson Reedy, M.D. [facility] Pulmonary/Critical Care Medicine. Pager: [phone]. After hours pager: [phone].

## 2016-08-22 NOTE — Progress Notes (Signed)
RT note- ETT advanced 3 cm with follow up x ray, confirmed to be in right mainstem, Dr. Molli KnockYacoub notified and ETT removed 3cc and secured at 27 at the lip.

## 2016-08-23 ENCOUNTER — Inpatient Hospital Stay (HOSPITAL_COMMUNITY): Payer: BLUE CROSS/BLUE SHIELD

## 2016-08-23 ENCOUNTER — Inpatient Hospital Stay (INDEPENDENT_AMBULATORY_CARE_PROVIDER_SITE_OTHER): Payer: Self-pay | Admitting: Orthopaedic Surgery

## 2016-08-23 LAB — BASIC METABOLIC PANEL
ANION GAP: 8 (ref 5–15)
BUN: 54 mg/dL — ABNORMAL HIGH (ref 6–20)
CALCIUM: 8.4 mg/dL — AB (ref 8.9–10.3)
CO2: 40 mmol/L — AB (ref 22–32)
Chloride: 102 mmol/L (ref 101–111)
Creatinine, Ser: 1.29 mg/dL — ABNORMAL HIGH (ref 0.61–1.24)
GFR calc non Af Amer: >60 mL/min (ref >60)
Glucose, Bld: 207 mg/dL — ABNORMAL HIGH (ref 65–99)
POTASSIUM: 4.8 mmol/L (ref 3.5–5.1)
Sodium: 150 mmol/L — ABNORMAL HIGH (ref 135–145)

## 2016-08-23 LAB — BLOOD GAS, ARTERIAL
ACID-BASE EXCESS: 14.6 mmol/L — AB (ref 0.0–2.0)
Acid-Base Excess: 14.2 mmol/L — ABNORMAL HIGH (ref 0.0–2.0)
BICARBONATE: 41.2 mmol/L — AB (ref 20.0–28.0)
Bicarbonate: 41.9 mmol/L — ABNORMAL HIGH (ref 20.0–28.0)
DRAWN BY: 365271
Drawn by: 365271
FIO2: 80
FIO2: 80
LHR: 30 {breaths}/min
O2 SAT: 99.1 %
O2 SAT: 98.2 %
PEEP/CPAP: 14 cmH2O
PEEP: 14 cmH2O
PH ART: 7.284 — AB (ref 7.350–7.450)
PH ART: 7.265 — AB (ref 7.350–7.450)
Patient temperature: 99.2
Patient temperature: 99.9
RATE: 35 resp/min
VT: 440 mL
VT: 440 mL
pCO2 arterial: 90.1 mmHg (ref 32.0–48.0)
pCO2 arterial: 96.6 mmHg (ref 32.0–48.0)
pO2, Arterial: 220 mmHg — ABNORMAL HIGH (ref 83.0–108.0)
pO2, Arterial: 137 mmHg — ABNORMAL HIGH (ref 83.0–108.0)

## 2016-08-23 LAB — CBC WITH DIFFERENTIAL/PLATELET
BASOS ABS: 0 10*3/uL (ref 0.0–0.1)
BASOS PCT: 0 %
Eosinophils Absolute: 0 10*3/uL (ref 0.0–0.7)
Eosinophils Relative: 0 %
HEMATOCRIT: 45.6 % (ref 39.0–52.0)
HEMOGLOBIN: 13.5 g/dL (ref 13.0–17.0)
LYMPHS PCT: 7 %
Lymphs Abs: 1 10*3/uL (ref 0.7–4.0)
MCH: 29.9 pg (ref 26.0–34.0)
MCHC: 29.6 g/dL — ABNORMAL LOW (ref 30.0–36.0)
MCV: 101.1 fL — ABNORMAL HIGH (ref 78.0–100.0)
MONO ABS: 0.9 10*3/uL (ref 0.1–1.0)
Monocytes Relative: 5 %
NEUTROS ABS: 14 10*3/uL — AB (ref 1.7–7.7)
NEUTROS PCT: 88 %
Platelets: 210 10*3/uL (ref 150–400)
RBC: 4.51 MIL/uL (ref 4.22–5.81)
RDW: 14.1 % (ref 11.5–15.5)
WBC: 15.9 10*3/uL — AB (ref 4.0–10.5)

## 2016-08-23 LAB — GLUCOSE, CAPILLARY
GLUCOSE-CAPILLARY: 229 mg/dL — AB (ref 65–99)
GLUCOSE-CAPILLARY: 246 mg/dL — AB (ref 65–99)
GLUCOSE-CAPILLARY: 226 mg/dL — AB (ref 65–99)
Glucose-Capillary: 200 mg/dL — ABNORMAL HIGH (ref 65–99)
Glucose-Capillary: 202 mg/dL — ABNORMAL HIGH (ref 65–99)
Glucose-Capillary: 226 mg/dL — ABNORMAL HIGH (ref 65–99)
Glucose-Capillary: 263 mg/dL — ABNORMAL HIGH (ref 65–99)

## 2016-08-23 LAB — POCT I-STAT 3, ART BLOOD GAS (G3+)
ACID-BASE EXCESS: 8 mmol/L — AB (ref 0.0–2.0)
Acid-Base Excess: 15 mmol/L — ABNORMAL HIGH (ref 0.0–2.0)
BICARBONATE: 42.9 mmol/L — AB (ref 20.0–28.0)
Bicarbonate: 34.2 mmol/L — ABNORMAL HIGH (ref 20.0–28.0)
O2 SAT: 98 %
O2 Saturation: 100 %
PCO2 ART: 70.1 mmHg — AB (ref 32.0–48.0)
PCO2 ART: 56.4 mmHg — AB (ref 32.0–48.0)
PH ART: 7.395 (ref 7.350–7.450)
PO2 ART: 104 mmHg (ref 83.0–108.0)
Patient temperature: 100.2
TCO2: 45 mmol/L (ref 0–100)
TCO2: 36 mmol/L (ref 0–100)
pH, Arterial: 7.399 (ref 7.350–7.450)
pO2, Arterial: 260 mmHg — ABNORMAL HIGH (ref 83.0–108.0)

## 2016-08-23 LAB — CULTURE, BLOOD (ROUTINE X 2)
CULTURE: NO GROWTH
Culture: NO GROWTH

## 2016-08-23 LAB — MAGNESIUM: Magnesium: 3.2 mg/dL — ABNORMAL HIGH (ref 1.7–2.4)

## 2016-08-23 LAB — PHOSPHORUS: PHOSPHORUS: 5.4 mg/dL — AB (ref 2.5–4.6)

## 2016-08-23 MED ORDER — FREE WATER
500.0000 mL | Status: DC
  Administered 2016-08-23: 500 mL

## 2016-08-23 MED ORDER — INSULIN ASPART 100 UNIT/ML ~~LOC~~ SOLN
3.0000 [IU] | SUBCUTANEOUS | Status: DC
  Administered 2016-08-23 – 2016-08-28 (×29): 3 [IU] via SUBCUTANEOUS

## 2016-08-23 MED ORDER — VANCOMYCIN HCL IN DEXTROSE 1-5 GM/200ML-% IV SOLN
1000.0000 mg | Freq: Two times a day (BID) | INTRAVENOUS | Status: DC
  Administered 2016-08-23 – 2016-08-24 (×2): 1000 mg via INTRAVENOUS
  Filled 2016-08-23 (×3): qty 200

## 2016-08-23 MED ORDER — FREE WATER
250.0000 mL | Status: DC
  Administered 2016-08-23 – 2016-08-26 (×23): 250 mL

## 2016-08-23 MED ORDER — INSULIN ASPART 100 UNIT/ML ~~LOC~~ SOLN
0.0000 [IU] | SUBCUTANEOUS | Status: DC
  Administered 2016-08-23 (×4): 7 [IU] via SUBCUTANEOUS
  Administered 2016-08-23: 4 [IU] via SUBCUTANEOUS
  Administered 2016-08-23: 11 [IU] via SUBCUTANEOUS
  Administered 2016-08-23 – 2016-08-24 (×2): 7 [IU] via SUBCUTANEOUS
  Administered 2016-08-24: 4 [IU] via SUBCUTANEOUS
  Administered 2016-08-24 (×2): 7 [IU] via SUBCUTANEOUS
  Administered 2016-08-24 (×2): 11 [IU] via SUBCUTANEOUS
  Administered 2016-08-25 (×2): 7 [IU] via SUBCUTANEOUS
  Administered 2016-08-25: 11 [IU] via SUBCUTANEOUS
  Administered 2016-08-25: 4 [IU] via SUBCUTANEOUS
  Administered 2016-08-25: 11 [IU] via SUBCUTANEOUS
  Administered 2016-08-25 – 2016-08-26 (×3): 7 [IU] via SUBCUTANEOUS
  Administered 2016-08-26: 11 [IU] via SUBCUTANEOUS
  Administered 2016-08-26: 7 [IU] via SUBCUTANEOUS
  Administered 2016-08-26: 4 [IU] via SUBCUTANEOUS
  Administered 2016-08-27: 11 [IU] via SUBCUTANEOUS
  Administered 2016-08-27: 4 [IU] via SUBCUTANEOUS
  Administered 2016-08-27 (×2): 15 [IU] via SUBCUTANEOUS
  Administered 2016-08-27 (×2): 7 [IU] via SUBCUTANEOUS
  Administered 2016-08-27: 4 [IU] via SUBCUTANEOUS
  Administered 2016-08-28: 7 [IU] via SUBCUTANEOUS

## 2016-08-23 NOTE — Progress Notes (Signed)
PULMONARY / CRITICAL CARE MEDICINE   Name: Bradley FraiseDavid M [name] MRN: [id number] DOB: [birth year]    ADMISSION DATE:  08/17/2016 CONSULTATION DATE:  08/19/16  REFERRING MD:  Dr Thayer Ohmhris Rama  CHIEF COMPLAINT:  Acute resp failure with hypoxemia  BRIEF 55 year old smoker, with PFT nov 2016 showing isolated reduction in dlco to 59% and quite functional and not on home o2. Followed by Dr Sherene SiresWert -0 last seen Sept/Nov 2016 based on CT chest evidence showing diffuse changes of centrilobular and paraseptal emphysema along with prominent interstitial markings primarily in the upper lobes consistent with pulmonary fibrosis. This was a non--UIP pattern. Patient recommended quitting smoking because of concerns of DIP. According to the patient and his wife, has continued to smoke. He has been in his baseline status of health and very functional. Then on 08/14/2016 he had a chest x-ray preoperative that only showed chronic changes without change for a right shoulder surgery rotator cuff tear. The procedure was uneventful. Postoperative course was uneventful. There was no vomiting. Then abruptly on 08/16/2016 he started getting acutely ill with shortness of breath and cough with brown colored sputum. Then admitted to the hospital. Initial white count was 13,000. BNP was normal. CT angiogram chest ruled out pulmonary embolism but showed diffuse ground glass opacities and enlarged mediastinal node measuring close to 2 cm. He was started on community acquired pneumonia antibiotics but is developed progressive acute respiratory failure necessitating Solu-Medrol since 08/18/2016 evening. And from the early hours of 08/19/2016 he's been on BiPAP with significant respiratory distress and therefore pulmonary critical care has been consulted  At baseline he denies any mold exposure or mildew exposure or working with dust. He denies any birds in the house. He continues to smoke. He has been afebrile in the hospital  Events: 08/17/2016  - admit 08/18/2016-respiratory virus panel, HIV and flu panel PCR negative 08/19/16 -bipap, move to icu -> intubated 10/21 10/22 repeat RVP from trach aspirate - negative 10/23 levo off 10/24 nimbex off  SUBJECTIVE/OVERNIGHT/INTERVAL HX 08/20/16 -> Intubated overnight (DIFFICULT). On ARDS protocol with nimbex now. ECHO: LVEF 60-65%. normal wall thickness, normal wall motion, normal LA size, moderate TR, RVSP 56 mmHg (moderate pulmonary hypertension), estimated RA pressure of 8 mmHg. 10/23 --> improved settings on vent. Levo off.  10/24 --> P to F ratio improved to 205 with today's ABG; CVP 5; FiO2 40, PEEP 10, RR 35, TV 440; paralyzed with nimbex 2 mcg 10/25 --> RR adjusted from 35 to 30 overnight due to respiratory acidosis. Did not improve gas, so RR decreased to 30 again. P/F ratios of 275 and 171, respectively.   VITAL SIGNS: BP 117/79   Pulse (!) 35   Temp 99.9 F (37.7 C) (Oral) Comment: ice packs applied  Resp (!) 31   Ht 5\' 9"  (1.753 m)   Wt 212 lb (96.2 kg)   SpO2 99%   BMI 31.31 kg/m   HEMODYNAMICS: CVP:  [12 mmHg-16 mmHg] 16 mmHg  VENTILATOR SETTINGS: Vent Mode: PRVC FiO2 (%):  [50 %-100 %] 80 % Set Rate:  [30 bmp-35 bmp] 35 bmp Vt Set:  [440 mL] 440 mL PEEP:  [10 cmH20-14 cmH20] 14 cmH20 Plateau Pressure:  [21 cmH20-32 cmH20] 27 cmH20  INTAKE / OUTPUT: I/O last 3 completed shifts: In: 4140.4 [I.V.:1508.6; Other:500; NG/GT:1081.8; IV Piggyback:1050] Out: 6605 [Urine:6605]   Lines: Foley 10/21 >> ETT and OG 10/21 >> CVC 10/22 >> PIV x 3 A-line left radial 10/22 >>  PHYSICAL EXAMINATION: General:  Middle-aged,  sedated and paralyzed, ETT in place Neuro: Sedated, not responded to sternal rub but did for nurse earlier in shift HEENT:  ETT in place Cardiovascular:  Regular rate and rhythm Lungs: Decreased BS at bases Abdomen:  Soft nontender Musculoskeletal:  No cyanosis no clubbing no edema. Looks euvolemic; clean dry bandage over R shoulder Skin:  Intact  on exposed areas  LABS: PULMONARY  Recent Labs Lab 08/20/16 0042 08/20/16 0318  08/20/16 1143 08/21/16 0855 08/22/16 0409 08/23/16 0306 08/23/16 0430  PHART 7.174* 7.197*  < > 7.275* 7.352 7.406 7.284* 7.265*  PCO2ART 88.2* 75.6*  < > 68.4* 65.5* 63.9* 90.1* 96.6*  PO2ART 337.0* 176.0*  < > 78.0* 90.6 82.0* 220* 137*  HCO3 32.8* 29.6*  < > 31.9* 35.5* 39.4* 41.2* 41.9*  TCO2 36 32  --  34  --   --   --   --   O2SAT 100.0 99.0  < > 93.0 96.3 95.3 99.1 98.2  < > = values in this interval not displayed. CBC  Recent Labs Lab 08/21/16 0400 08/22/16 0440 08/23/16 0430  HGB 12.5* 12.3* 13.5  HCT 39.3 39.5 45.6  WBC 9.5 10.5 15.9*  PLT 257 207 210   COAGULATION  Recent Labs Lab 08/18/16 0309  INR 1.20   CARDIAC    Recent Labs Lab 08/19/16 1057 08/19/16 1900 08/20/16 0250  TROPONINI <0.03 <0.03 0.07*   No results for input(s): PROBNP in the last 168 hours.   CHEMISTRY  Recent Labs Lab 08/19/16 0221 08/20/16 0250 08/21/16 0400 08/22/16 0440 08/23/16 0430  NA 137 141 144 147* 150*  K 3.7 4.4 4.5 4.6 4.8  CL 107 104 104 103 102  CO2 22 26 31  37* 40*  GLUCOSE 144* 215* 224* 162* 207*  BUN 15 24* 31* 41* 54*  CREATININE 0.72 1.13 0.93 1.02 1.29*  CALCIUM 8.3* 8.3* 8.2* 8.5* 8.4*  MG  --   --  2.7* 3.1* 3.2*  PHOS  --   --  4.0 3.7 5.4*   Estimated Creatinine Clearance: 74 mL/min (by C-G formula based on SCr of 1.29 mg/dL (H)).  LIVER  Recent Labs Lab 08/18/16 0309  INR 1.20     INFECTIOUS  Recent Labs Lab 08/18/16 0218 08/18/16 0309 08/18/16 0844 08/19/16 1057  LATICACIDVEN 1.6  --  2.4* 1.8  PROCALCITON  --  0.34  --   --      ENDOCRINE CBG (last 3)   Recent Labs  08/22/16 2049 08/22/16 2343 08/23/16 0339  GLUCAP 190* 263* 202*     IMAGING x48h  - image(s) personally visualized  -   highlighted in bold Dg Chest Port 1 View  Result Date: 08/22/2016 CLINICAL DATA:  Hypoxia, endotracheal tube repositioning EXAM: PORTABLE  CHEST 1 VIEW COMPARISON:  08/22/2016 at 0516 hours FINDINGS: The tip of an endotracheal tube is seen in the proximal right mainstem bronchus and pullback of the tube is recommended about approximately 4.5 cm. Right IJ line central canal tip is seen in the mid SVC. Gastric tube extends below level of the diaphragm in the expected location of the stomach and proximal duodenum. Patchy airspace opacities at each lung base left greater right. Mild to moderate pulmonary vascular congestion. Heart is top-normal in size. The aorta is not aneurysmal. No suspicious osseous lesions. IMPRESSION: Malpositioned endotracheal tube in the right proximal mainstem bronchus. Pullback approximately 4.5 cm. Right IJ and gastric tubes are in satisfactory position. Relatively unchanged interstitial edema. Superimposed bibasilar airspace disease/ pneumonia not entirely  excluded. Critical Value/emergent results were called by telephone at the time of interpretation on 08/22/2016 at 2:28 pm to Nurse Jacquenette Shone, who verbally acknowledged these results. Patient had ETT pulled back approximately 3 cm per discussion with followup CXR ordered. Electronically Signed   By: Tollie Eth M.D.   On: 08/22/2016 14:29   Dg Chest Port 1 View  Result Date: 08/22/2016 CLINICAL DATA:  Respiratory difficulty EXAM: PORTABLE CHEST 1 VIEW COMPARISON:  Yesterday FINDINGS: Endotracheal tube, NG tube, right jugular venous catheter are stable. Heterogeneous opacities throughout both lungs have slightly improved. It remains left greater than right. No pneumothorax. No pleural effusion. Normal heart size. IMPRESSION: Improved bilateral pulmonary parenchymal disease suggesting improved ARDS or pneumonia. Electronically Signed   By: Jolaine Click M.D.   On: 08/22/2016 07:48    Summary:  55-y/o male with ILD and tobacco abuse history in acute respiratory failure after intubation for rotator cuff surgery. ALI with recent intubation for surgery vs pneumonia. ARDS.    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 No known ILD exposures per hx  #Admit 08/17/2016 - Abrupt respiratory illness with acute hypoxemic respiratory failure with significant deterioration 3 days after right shoulder surgery. Pulmonary embolism ruled out. Diffuse ground glass opacities on CT chest at admission  - Differential diagnosis includes: Undiagnosed IPF flare, acute lung injury postop idiopathic, Boop, acute eosinophilic pneumonia and viral induced ARDS (less likely as repeat negative)  #Current 08/23/16  - improving ARDS, off nimbex 10/24. On 40% fio2, peep 10.  - Trach cx with normal respiratory flora  P:   ARDS NET protocol since 08/20/16 BAL with lavage when more stable Cotinue abx for now Continue steroids until extubated F/U autoimmune and vasculitis - negative except for RF mildly elevated at 17.6 Ipratroprium and xopenex q6h  Discontinue robitussin in anticipation of extubation Diureses as ordered  CARDIOVASCULAR A:  MI ruled out but troponin increased to 0.07. Suspect demand. ECHO ok 10/22 - started on levophed, discontinued 10/23 Net negative 1.7 P:  Monitor Obtain q6h CVPs  RENAL A:   At risk for lactic acidosis and renal injury Bicarb 41.9 after diamox x 3 10/24 Free water deficit 4.1 L P:   Repeat lasix 40 mg IV q6 x3 doses. Increase free water to 500 ml q3 hours Replace electrolytes as indicated KVO IVF BMET in AM.  GASTROINTESTINAL A:   At risk for aspiration with intubation P:   NPO except meds TFs per nutrition  HEMATOLOGIC A:   At risk for anemia of critical illness P:  Monitor - stable DVT prophylaxis - lovenox  INFECTIOUS A:   Unclear if this is community acquired pneumonia P:   Check pro calcitonin --> 0.34 Continue broad antibiotic  therapy >> vanc and zosyn (Day 5, Day 6 abx)  ENDOCRINE A:   At risk for hyperglycemia  - CBGs low 200s P:   ICU hyperglycemia protocol Solumedrol 60 q6h (day 6) Added TF coverage  NEUROLOGIC A:   At risk for delirium P:   Monitor RASS goal: -5 with bIS goal < 50 Fent/versed gtt with short term nimbex D/C nimbex  FAMILY  - Updates: Family updated at bedside 10/24 by Dr. Molli Knock.   - Inter-disciplinary family meet or Palliative Care meeting due by: 08/26/2016 - done 08/20/16  Dani Gobble, MD [facility], PGY-2  [facility]  Kathryne Sharper, M.D. [facility] Pulmonary/Critical Care Medicine. Pager: [phone]. After hours pager: [phone].

## 2016-08-23 NOTE — Progress Notes (Signed)
Critical ABG called to RN.  

## 2016-08-23 NOTE — Telephone Encounter (Signed)
Got it. Still smoking and went into respiratory failure.

## 2016-08-23 NOTE — Progress Notes (Signed)
Spoke with Resident Kandis MannanKanishka Gunadasa who had [facility] [place]. Will turn the RR back to 35 and will get a CXR.

## 2016-08-23 NOTE — Progress Notes (Signed)
Interim Progress Note:   Nursing reported of critical ABG: pH 7.28/90.1/220 with bicarb 41.2 while on 80% FiO2, 14 PEEP, RR35, TV 440.   Discussed with Dr. Arsenio Load[place]Sommer in [place]. He believes patient is a chronic retainer of CO2 and is not far from his baseline. However, there may be some air trapping at the rate that he is on ([id number]). He has been on these settings all day yesterday, except we did discontinue his paralytics yesterday morning. Per Dr. Arsenio LoaderSommer will decrease rate to 30 and follow up ABG in 1 hr.   Palma HolterKanishka G Glennis Montenegro, MD PGY 2 Family Medicine

## 2016-08-23 NOTE — Progress Notes (Addendum)
PULMONARY / CRITICAL CARE MEDICINE   Name: Bradley [name] MRN: [id number] DOB: 1960/12/01    ADMISSION DATE:  08/17/2016 CONSULTATION DATE:  08/19/16  REFERRING MD:  Dr Thayer Ohm Rama  CHIEF COMPLAINT:  Acute resp failure with hypoxemia  BRIEF 55 year old smoker, with PFT nov 2016 showing isolated reduction in dlco to 59% and quite functional and not on home o2. Followed by Dr Sherene Sires -0 last seen Sept/Nov 2016 based on CT chest evidence showing diffuse changes of centrilobular and paraseptal emphysema along with prominent interstitial markings primarily in the upper lobes consistent with pulmonary fibrosis. This was a non--UIP pattern. Patient recommended quitting smoking because of concerns of DIP. According to the patient and his wife, has continued to smoke. He has been in his baseline status of health and very functional. Then on 08/14/2016 he had a chest x-ray preoperative that only showed chronic changes without change for a right shoulder surgery rotator cuff tear. The procedure was uneventful. Postoperative course was uneventful. There was no vomiting. Then abruptly on 08/16/2016 he started getting acutely ill with shortness of breath and cough with brown colored sputum. Then admitted to the hospital. Initial white count was 13,000. BNP was normal. CT angiogram chest ruled out pulmonary embolism but showed diffuse ground glass opacities and enlarged mediastinal node measuring close to 2 cm. He was started on community acquired pneumonia antibiotics but is developed progressive acute respiratory failure necessitating Solu-Medrol since 08/18/2016 evening. And from the early hours of 08/19/2016 he's been on BiPAP with significant respiratory distress and therefore pulmonary critical care has been consulted  At baseline he denies any mold exposure or mildew exposure or working with dust. He denies any birds in the house. He continues to smoke. He has been afebrile in the hospital  Events: 08/17/2016  - admit 08/18/2016-respiratory virus panel, HIV and flu panel PCR negative 08/19/16 -bipap, move to icu -> intubated 10/21 10/22 repeat RVP from trach aspirate - negative 10/23 levo off 10/24 nimbex off  SUBJECTIVE/OVERNIGHT/INTERVAL HX 08/20/16 -> Intubated overnight (DIFFICULT). On ARDS protocol with nimbex now. ECHO: LVEF 60-65%. normal wall thickness, normal wall motion, normal LA size, moderate TR, RVSP 56 mmHg (moderate pulmonary hypertension), estimated RA pressure of 8 mmHg. 10/23 --> improved settings on vent. Levo off.  10/24 --> P to F ratio improved to 205 with today's ABG; CVP 5; FiO2 40, PEEP 10, RR 35, TV 440; paralyzed with nimbex 2 mcg 10/25 --> RR adjusted from 35 to 30 overnight due to respiratory acidosis. Did not improve gas, so RR decreased to 30 again. P/F ratios of 275 and 171, respectively.   VITAL SIGNS: BP 117/74   Pulse (!) 106   Temp 100.1 F (37.8 C) (Oral)   Resp (!) 0   Ht 5\' 9"  (1.753 m)   Wt 96.2 kg (212 lb)   SpO2 99%   BMI 31.31 kg/m   HEMODYNAMICS: CVP:  [12 mmHg-16 mmHg] 16 mmHg  VENTILATOR SETTINGS: Vent Mode: PRVC FiO2 (%):  [70 %-100 %] 80 % Set Rate:  [30 bmp-35 bmp] 35 bmp Vt Set:  [440 mL] 440 mL PEEP:  [10 cmH20-14 cmH20] 14 cmH20 Plateau Pressure:  [26 cmH20-32 cmH20] 27 cmH20  INTAKE / OUTPUT: I/O last 3 completed shifts: In: 5443.4 [I.V.:1491.6; Other:500; NG/GT:2001.8; IV Piggyback:1450] Out: 6130 [Urine:6130]   Lines: Foley 10/21 >> ETT and OG 10/21 >> CVC 10/22 >> PIV x 3 A-line left radial 10/22 >>  PHYSICAL EXAMINATION: General:  Middle-aged, sedated and paralyzed,  ETT in place Neuro: Sedated, not responded to sternal rub but did for nurse earlier in shift HEENT:  ETT in place Cardiovascular:  Regular rate and rhythm Lungs: Decreased BS at bases Abdomen:  Soft nontender Musculoskeletal:  No cyanosis no clubbing no edema. Looks euvolemic; clean dry bandage over R shoulder Skin:  Intact on exposed  areas  LABS: PULMONARY  Recent Labs Lab 08/20/16 0042 08/20/16 0318  08/20/16 1143 08/21/16 0855 08/22/16 0409 08/23/16 0306 08/23/16 0430  PHART 7.174* 7.197*  < > 7.275* 7.352 7.406 7.284* 7.265*  PCO2ART 88.2* 75.6*  < > 68.4* 65.5* 63.9* 90.1* 96.6*  PO2ART 337.0* 176.0*  < > 78.0* 90.6 82.0* 220* 137*  HCO3 32.8* 29.6*  < > 31.9* 35.5* 39.4* 41.2* 41.9*  TCO2 36 32  --  34  --   --   --   --   O2SAT 100.0 99.0  < > 93.0 96.3 95.3 99.1 98.2  < > = values in this interval not displayed. CBC  Recent Labs Lab 08/21/16 0400 08/22/16 0440 08/23/16 0430  HGB 12.5* 12.3* 13.5  HCT 39.3 39.5 45.6  WBC 9.5 10.5 15.9*  PLT 257 207 210   COAGULATION  Recent Labs Lab 08/18/16 0309  INR 1.20   CARDIAC    Recent Labs Lab 08/19/16 1057 08/19/16 1900 08/20/16 0250  TROPONINI <0.03 <0.03 0.07*   No results for input(s): PROBNP in the last 168 hours.   CHEMISTRY  Recent Labs Lab 08/19/16 0221 08/20/16 0250 08/21/16 0400 08/22/16 0440 08/23/16 0430  NA 137 141 144 147* 150*  K 3.7 4.4 4.5 4.6 4.8  CL 107 104 104 103 102  CO2 22 26 31  37* 40*  GLUCOSE 144* 215* 224* 162* 207*  BUN 15 24* 31* 41* 54*  CREATININE 0.72 1.13 0.93 1.02 1.29*  CALCIUM 8.3* 8.3* 8.2* 8.5* 8.4*  MG  --   --  2.7* 3.1* 3.2*  PHOS  --   --  4.0 3.7 5.4*   Estimated Creatinine Clearance: 74 mL/min (by C-G formula based on SCr of 1.29 mg/dL (H)).  LIVER  Recent Labs Lab 08/18/16 0309  INR 1.20     INFECTIOUS  Recent Labs Lab 08/18/16 0218 08/18/16 0309 08/18/16 0844 08/19/16 1057  LATICACIDVEN 1.6  --  2.4* 1.8  PROCALCITON  --  0.34  --   --      ENDOCRINE CBG (last 3)   Recent Labs  08/22/16 2343 08/23/16 0339 08/23/16 0723  GLUCAP 263* 202* 229*     IMAGING x48h  - image(s) personally visualized  -   highlighted in bold Dg Chest Port 1 View  Result Date: 08/23/2016 CLINICAL DATA:  Intubation. EXAM: PORTABLE CHEST 1 VIEW COMPARISON:  08/22/2016.   CT 08/17/2016. FINDINGS: Endotracheal tube, NG tube, right IJ line in stable position. Heart size stable. Diffuse bilateral pulmonary interstitial edema and/or infiltrates again noted. No pneumothorax . IMPRESSION: 1. Lines and tubes in stable position. 2. Persistent bilateral pulmonary interstitial edema and/or infiltrates again noted. No interim change. Heart size normal. Electronically Signed   By: Maisie Fus  Register   On: 08/23/2016 07:27   Dg Chest Port 1 View  Result Date: 08/22/2016 CLINICAL DATA:  Hypoxia, endotracheal tube repositioning EXAM: PORTABLE CHEST 1 VIEW COMPARISON:  08/22/2016 at 0516 hours FINDINGS: The tip of an endotracheal tube is seen in the proximal right mainstem bronchus and pullback of the tube is recommended about approximately 4.5 cm. Right IJ line central canal tip is seen  in the mid SVC. Gastric tube extends below level of the diaphragm in the expected location of the stomach and proximal duodenum. Patchy airspace opacities at each lung base left greater right. Mild to moderate pulmonary vascular congestion. Heart is top-normal in size. The aorta is not aneurysmal. No suspicious osseous lesions. IMPRESSION: Malpositioned endotracheal tube in the right proximal mainstem bronchus. Pullback approximately 4.5 cm. Right IJ and gastric tubes are in satisfactory position. Relatively unchanged interstitial edema. Superimposed bibasilar airspace disease/ pneumonia not entirely excluded. Critical Value/emergent results were called by telephone at the time of interpretation on 08/22/2016 at 2:28 pm to Nurse Jacquenette ShoneJulian, who verbally acknowledged these results. Patient had ETT pulled back approximately 3 cm per discussion with followup CXR ordered. Electronically Signed   By: Tollie Ethavid  Kwon M.D.   On: 08/22/2016 14:29   Dg Chest Port 1 View  Result Date: 08/22/2016 CLINICAL DATA:  Respiratory difficulty EXAM: PORTABLE CHEST 1 VIEW COMPARISON:  Yesterday FINDINGS: Endotracheal tube, NG tube,  right jugular venous catheter are stable. Heterogeneous opacities throughout both lungs have slightly improved. It remains left greater than right. No pneumothorax. No pleural effusion. Normal heart size. IMPRESSION: Improved bilateral pulmonary parenchymal disease suggesting improved ARDS or pneumonia. Electronically Signed   By: Jolaine ClickArthur  Hoss M.D.   On: 08/22/2016 07:48    Summary:  55-y/o male with ILD and tobacco abuse history in acute respiratory failure after intubation for rotator cuff surgery. ALI with recent intubation for surgery vs pneumonia. ARDS.   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 No known ILD exposures per hx  #Admit 08/17/2016 - Abrupt respiratory illness with acute hypoxemic respiratory failure with significant deterioration 3 days after right shoulder surgery. Pulmonary embolism ruled out. Diffuse ground glass opacities on CT chest at admission  - Differential diagnosis includes: Undiagnosed IPF flare, acute lung injury postop idiopathic, Boop, acute eosinophilic pneumonia and viral induced ARDS (less likely as repeat negative)  #Current 08/23/16  - improving ARDS, off nimbex 10/24. On 40% fio2, peep 10.  - Trach cx with normal respiratory flora  P:   ARDS NET protocol since 08/20/16 Change to PCV given asynchrony and concern for auto-PEEP, repeat ABG in 30 minutes BAL with lavage when more stable Cotinue abx for now Continue steroids until extubated F/U autoimmune and vasculitis - negative except for RF mildly elevated at 17.6 Ipratroprium and xopenex q6h  Discontinue robitussin in anticipation of extubation Diureses as ordered  CARDIOVASCULAR A:  MI ruled out but troponin increased to 0.07. Suspect demand. ECHO ok 10/22 - started on levophed, discontinued 10/23 Net negative  1.7 P:  Monitor Obtain q6h CVPs  RENAL A:   At risk for lactic acidosis and renal injury Bicarb 41.9 after diamox x 3 10/24 Free water deficit 4.1 L P:   D/C all diuretics Decrease free water to 250 ml q3 hours Replace electrolytes as indicated KVO IVF BMET in AM.  GASTROINTESTINAL A:   At risk for aspiration with intubation P:   NPO except meds TFs per nutrition  HEMATOLOGIC A:   At risk for anemia of critical illness P:  Monitor - stable DVT prophylaxis - lovenox  INFECTIOUS A:   Unclear if this is community acquired pneumonia P:   Check pro calcitonin --> 0.34 Continue broad antibiotic therapy >> vanc and zosyn (Day 5, Day 6 abx)  ENDOCRINE  A:   At risk for hyperglycemia  - CBGs low 200s P:   ICU hyperglycemia protocol Solumedrol 60 q6h (day 6) Added TF coverage  NEUROLOGIC A:   At risk for delirium AMS this AM, unresponsive P:   Monitor RASS goal: -5 with bIS goal < 50 Fent/versed gtt with short term nimbex Head CT for unresponsiveness  FAMILY  - Updates: Family updated at bedside 10/25.  Spoke with wife at length, if no improvement by tomorrow then will need to decide on trach/peg vs comfort.  Patient did not want trach/peg.  Wife understandably sad but did not want trach/peg.  - Inter-disciplinary family meet or Palliative Care meeting due by: 08/26/2016 - done 08/20/16  The patient is critically ill with multiple organ systems failure and requires high complexity decision making for assessment and support, frequent evaluation and titration of therapies, application of advanced monitoring technologies and extensive interpretation of multiple databases.   Critical Care Time devoted to patient care services described in this note is  35  Minutes. This time reflects time of care of this signee Dr Koren Bound. This critical care time does not reflect procedure time, or teaching time or supervisory time of PA/NP/Med student/Med Resident etc but could  involve care discussion time.  Alyson Reedy, M.D. [facility] Pulmonary/Critical Care Medicine. Pager: [phone]. After hours pager: [phone].

## 2016-08-23 NOTE — Progress Notes (Signed)
Critical ABG called in RN

## 2016-08-23 NOTE — Progress Notes (Signed)
   08/23/16 1600  Clinical Encounter Type  Visited With Patient and family together  Visit Type Spiritual support  Spiritual Encounters  Spiritual Needs Prayer;Emotional  Stress Factors  Patient Stress Factors Major life changes  Family Stress Factors Family relationships;Health changes  Visited Pt and family in room. Offered prayer and emotional support to family.

## 2016-08-23 NOTE — Progress Notes (Signed)
Pharmacy Antibiotic Note  Ripley FraiseDavid M Fails is a 55 y.o. male admitted on 08/17/2016 with pneumonia.  Pharmacy has been consulted for vancomycin and zosyn dosing. Plan per CCM is to continue broad-spectrum antibiotics for now. Vancomycin trough 10/23 was within goal at 19. However, his renal function has declined with SCr increase 0.72 >> 1.29 with good urine output. He has a low grade fever (Tmax 100.1) and WBC have increased 10.5 >> 15.9.   Plan: - Decrease vancomycin to 1g IV q12h (goal trough 15-20 mcg/ml) - Continue Zosyn 3.375 g IV q8h - 4 hour infusion - Monitor culture data, renal function and clinical course - Monitor length of therapy and narrow antimicrobials as appropriate  Height: 5\' 9"  (175.3 cm) Weight: 212 lb (96.2 kg) IBW/kg (Calculated) : 70.7  Temp (24hrs), Avg:99.8 F (37.7 C), Min:99.2 F (37.3 C), Max:100.3 F (37.9 C)   Recent Labs Lab 08/18/16 0218 08/18/16 0844 08/19/16 0221 08/19/16 1057 08/20/16 0250 08/21/16 0400 08/21/16 1130 08/22/16 0440 08/23/16 0430  WBC  --   --  13.6*  --  13.2* 9.5  --  10.5 15.9*  CREATININE  --   --  0.72  --  1.13 0.93  --  1.02 1.29*  LATICACIDVEN 1.6 2.4*  --  1.8  --   --   --   --   --   VANCOTROUGH  --   --   --   --   --   --  19  --   --     Estimated Creatinine Clearance: 74 mL/min (by C-G formula based on SCr of 1.29 mg/dL (H)).    Allergies  Allergen Reactions  . Wellbutrin [Bupropion] Cough    Antimicrobials this admission:  Azith 10/20>>10/21 CTX 10/20>>10/21 Vanc 10/21 >> Zosyn 10/21 >>  Dose adjustments this admission:  10/23 VT: 19 > no dose adjustment indicated 10/25 worsening renal function, decreased vancomycin to q12h  Microbiology results:  10/20 Respiratory panel PCR: neg 10/20 BCx: ngtd 10/22 Trach Aspirate: ngtd   10/20 MRSA PCR: neg 10/22 Resp panel PCR: neg 10/23 Legionella: IP  Dannial MonarchHailey Hersh Minney, PharmD Candidate 08/23/2016

## 2016-08-24 ENCOUNTER — Inpatient Hospital Stay (HOSPITAL_COMMUNITY): Payer: BLUE CROSS/BLUE SHIELD

## 2016-08-24 LAB — BASIC METABOLIC PANEL
Anion gap: 7 (ref 5–15)
BUN: 39 mg/dL — ABNORMAL HIGH (ref 6–20)
CALCIUM: 8.5 mg/dL — AB (ref 8.9–10.3)
CO2: 34 mmol/L — ABNORMAL HIGH (ref 22–32)
CREATININE: 0.91 mg/dL (ref 0.61–1.24)
Chloride: 106 mmol/L (ref 101–111)
GFR calc non Af Amer: >60 mL/min (ref >60)
Glucose, Bld: 210 mg/dL — ABNORMAL HIGH (ref 65–99)
Potassium: 4.5 mmol/L (ref 3.5–5.1)
SODIUM: 147 mmol/L — AB (ref 135–145)

## 2016-08-24 LAB — GLUCOSE, CAPILLARY
GLUCOSE-CAPILLARY: 225 mg/dL — AB (ref 65–99)
GLUCOSE-CAPILLARY: 208 mg/dL — AB (ref 65–99)
GLUCOSE-CAPILLARY: 180 mg/dL — AB (ref 65–99)
Glucose-Capillary: 224 mg/dL — ABNORMAL HIGH (ref 65–99)
Glucose-Capillary: 253 mg/dL — ABNORMAL HIGH (ref 65–99)

## 2016-08-24 LAB — PHOSPHORUS: Phosphorus: 3.2 mg/dL (ref 2.5–4.6)

## 2016-08-24 LAB — CBC
HCT: 41.3 % (ref 39.0–52.0)
Hemoglobin: 12.7 g/dL — ABNORMAL LOW (ref 13.0–17.0)
MCH: 30 pg (ref 26.0–34.0)
MCHC: 30.8 g/dL (ref 30.0–36.0)
MCV: 97.6 fL (ref 78.0–100.0)
PLATELETS: 189 10*3/uL (ref 150–400)
RBC: 4.23 MIL/uL (ref 4.22–5.81)
RDW: 14.3 % (ref 11.5–15.5)
WBC: 16.7 10*3/uL — ABNORMAL HIGH (ref 4.0–10.5)

## 2016-08-24 LAB — POCT I-STAT 3, ART BLOOD GAS (G3+)
ACID-BASE EXCESS: 9 mmol/L — AB (ref 0.0–2.0)
Bicarbonate: 36.7 mmol/L — ABNORMAL HIGH (ref 20.0–28.0)
O2 SAT: 97 %
PH ART: 7.384 (ref 7.350–7.450)
Patient temperature: 99.8
TCO2: 38 mmol/L (ref 0–100)
pCO2 arterial: 61.8 mmHg — ABNORMAL HIGH (ref 32.0–48.0)
pO2, Arterial: 97 mmHg (ref 83.0–108.0)

## 2016-08-24 LAB — BLOOD GAS, ARTERIAL
ACID-BASE EXCESS: 8.7 mmol/L — AB (ref 0.0–2.0)
BICARBONATE: 34.1 mmol/L — AB (ref 20.0–28.0)
FIO2: 60
O2 Saturation: 97.1 %
PATIENT TEMPERATURE: 98.6
PCO2 ART: 60.9 mmHg — AB (ref 32.0–48.0)
PEEP: 8 cmH2O
PH ART: 7.367 (ref 7.350–7.450)
Pressure control: 21 cmH2O
RATE: 35 resp/min
pO2, Arterial: 101 mmHg (ref 83.0–108.0)

## 2016-08-24 LAB — MAGNESIUM: Magnesium: 2.8 mg/dL — ABNORMAL HIGH (ref 1.7–2.4)

## 2016-08-24 MED ORDER — INSULIN GLARGINE 100 UNIT/ML ~~LOC~~ SOLN
10.0000 [IU] | Freq: Every day | SUBCUTANEOUS | Status: DC
  Administered 2016-08-24 – 2016-08-25 (×2): 10 [IU] via SUBCUTANEOUS
  Filled 2016-08-24 (×2): qty 0.1

## 2016-08-24 MED ORDER — FUROSEMIDE 10 MG/ML IJ SOLN
40.0000 mg | Freq: Four times a day (QID) | INTRAMUSCULAR | Status: AC
Start: 2016-08-24 — End: 2016-08-24
  Administered 2016-08-24 (×3): 40 mg via INTRAVENOUS
  Filled 2016-08-24 (×3): qty 4

## 2016-08-24 MED ORDER — ACETAZOLAMIDE SODIUM 500 MG IJ SOLR
250.0000 mg | Freq: Four times a day (QID) | INTRAMUSCULAR | Status: AC
  Administered 2016-08-24 (×3): 250 mg via INTRAVENOUS
  Filled 2016-08-24 (×3): qty 250

## 2016-08-24 MED ORDER — PROPOFOL 1000 MG/100ML IV EMUL
5.0000 ug/kg/min | INTRAVENOUS | Status: DC
  Administered 2016-08-24: 5 ug/kg/min via INTRAVENOUS
  Administered 2016-08-25 – 2016-08-26 (×3): 10 ug/kg/min via INTRAVENOUS
  Administered 2016-08-26: 20 ug/kg/min via INTRAVENOUS
  Administered 2016-08-27: 10 ug/kg/min via INTRAVENOUS
  Administered 2016-08-27: 5 ug/kg/min via INTRAVENOUS
  Administered 2016-08-28: 15 ug/kg/min via INTRAVENOUS
  Administered 2016-08-28 – 2016-08-29 (×2): 10 ug/kg/min via INTRAVENOUS
  Administered 2016-08-29: 15 ug/kg/min via INTRAVENOUS
  Administered 2016-08-29: 10 ug/kg/min via INTRAVENOUS
  Filled 2016-08-24 (×13): qty 100

## 2016-08-24 MED ORDER — METHYLPREDNISOLONE SODIUM SUCC 125 MG IJ SOLR
60.0000 mg | Freq: Two times a day (BID) | INTRAMUSCULAR | Status: DC
  Administered 2016-08-24 – 2016-08-28 (×8): 60 mg via INTRAVENOUS
  Filled 2016-08-24 (×7): qty 0.96
  Filled 2016-08-24: qty 2
  Filled 2016-08-24: qty 0.96

## 2016-08-24 NOTE — Progress Notes (Addendum)
PULMONARY / CRITICAL CARE MEDICINE   Name: Bradley [name] MRN: [id number] DOB: December 14, 1960    ADMISSION DATE:  08/17/2016 CONSULTATION DATE:  08/19/16  REFERRING MD:  Dr Thayer Ohm Rama  CHIEF COMPLAINT:  Acute resp failure with hypoxemia  BRIEF 55 year old smoker, with PFT nov 2016 showing isolated reduction in dlco to 59% and quite functional and not on home o2. Followed by Dr Sherene Sires -0 last seen Sept/Nov 2016 based on CT chest evidence showing diffuse changes of centrilobular and paraseptal emphysema along with prominent interstitial markings primarily in the upper lobes consistent with pulmonary fibrosis. This was a non--UIP pattern. Patient recommended quitting smoking because of concerns of DIP. According to the patient and his wife, has continued to smoke. He has been in his baseline status of health and very functional. Then on 08/14/2016 he had a chest x-ray preoperative that only showed chronic changes without change for a right shoulder surgery rotator cuff tear. The procedure was uneventful. Postoperative course was uneventful. There was no vomiting. Then abruptly on 08/16/2016 he started getting acutely ill with shortness of breath and cough with brown colored sputum. Then admitted to the hospital. Initial white count was 13,000. BNP was normal. CT angiogram chest ruled out pulmonary embolism but showed diffuse ground glass opacities and enlarged mediastinal node measuring close to 2 cm. He was started on community acquired pneumonia antibiotics but is developed progressive acute respiratory failure necessitating Solu-Medrol since 08/18/2016 evening. And from the early hours of 08/19/2016 he's been on BiPAP with significant respiratory distress and therefore pulmonary critical care has been consulted  At baseline he denies any mold exposure or mildew exposure or working with dust. He denies any birds in the house. He continues to smoke.   Events: 08/17/2016 - admit 08/18/2016-respiratory  virus panel, HIV and flu panel PCR negative 08/19/16 -bipap, move to icu -> intubated 10/21 10/22 repeat RVP from trach aspirate - negative 10/23 levo off 10/24 nimbex off  SUBJECTIVE/OVERNIGHT/INTERVAL HX 08/20/16 -> Intubated overnight (DIFFICULT). On ARDS protocol with nimbex now. ECHO: LVEF 60-65%. normal wall thickness, normal wall motion, normal LA size, moderate TR, RVSP 56 mmHg (moderate pulmonary hypertension), estimated RA pressure of 8 mmHg. 10/23 --> improved settings on vent. Levo off.  10/24 --> P to F ratio improved to 205 with today's ABG; CVP 5; FiO2 40, PEEP 10, RR 35, TV 440; paralyzed with nimbex 2 mcg 10/25 --> RR adjusted from 35 to 30 overnight due to respiratory acidosis. Did not improve gas, so RR decreased to 30 again. P/F ratios of 275 and 171, respectively.  10/26 --> ABGs improved on pressure control vent settings. FiO2 increased from 40 to 60 overnight due to desaturations. Decreased to 50 this a.m. Febrile to 100.4 F yesterday.   VITAL SIGNS: BP 121/81 (BP Location: Right Arm)   Pulse (!) 119   Temp 100.1 F (37.8 C) (Oral)   Resp (!) 29   Ht 5\' 9"  (1.753 m)   Wt 96.2 kg (212 lb 1.3 oz)   SpO2 92%   BMI 31.32 kg/m   HEMODYNAMICS: CVP:  [4 mmHg-15 mmHg] 13 mmHg  VENTILATOR SETTINGS: Vent Mode: PCV FiO2 (%):  [40 %-70 %] 50 % Set Rate:  [35 bmp] 35 bmp PEEP:  [8 cmH20-14 cmH20] 8 cmH20 Plateau Pressure:  [17 cmH20-27 cmH20] 27 cmH20  INTAKE / OUTPUT: I/O last 3 completed shifts: In: 5735.5 [I.V.:916; NG/GT:3769.5; IV Piggyback:1050] Out: 5275 [Urine:5275]   Abx: Zosyn 10/21>>> Vanc 10/21>>>10/26  Lines: Foley  10/21 >> ETT and OG 10/21 >> CVC 10/22 >> PIV x 3 A-line left radial 10/22 >>  PHYSICAL EXAMINATION: General:  Middle-aged, sedated, ETT in place Neuro: Sedated, arousable HEENT:  ETT in place Cardiovascular:  Regular rate and rhythm Lungs: Decreased BS at bases Abdomen:  Soft nontender Musculoskeletal:  No cyanosis no  clubbing no edema. Looks euvolemic; clean dry bandage over R shoulder Skin:  Intact on exposed areas  LABS:  PULMONARY  Recent Labs Lab 08/20/16 0042 08/20/16 0318  08/20/16 1143  08/23/16 0306 08/23/16 0430 08/23/16 1302 08/23/16 1700 08/24/16 0405  PHART 7.174* 7.197*  < > 7.275*  < > 7.284* 7.265* 7.399 7.395 7.367  PCO2ART 88.2* 75.6*  < > 68.4*  < > 90.1* 96.6* 70.1* 56.4* 60.9*  PO2ART 337.0* 176.0*  < > 78.0*  < > 220* 137* 260.0* 104.0 101  HCO3 32.8* 29.6*  < > 31.9*  < > 41.2* 41.9* 42.9* 34.2* 34.1*  TCO2 36 32  --  34  --   --   --  45 36  --   O2SAT 100.0 99.0  < > 93.0  < > 99.1 98.2 100.0 98.0 97.1  < > = values in this interval not displayed. CBC  Recent Labs Lab 08/22/16 0440 08/23/16 0430 08/24/16 0500  HGB 12.3* 13.5 12.7*  HCT 39.5 45.6 41.3  WBC 10.5 15.9* 16.7*  PLT 207 210 189   COAGULATION  Recent Labs Lab 08/18/16 0309  INR 1.20   CARDIAC   Recent Labs Lab 08/19/16 1057 08/19/16 1900 08/20/16 0250  TROPONINI <0.03 <0.03 0.07*   No results for input(s): PROBNP in the last 168 hours.  CHEMISTRY  Recent Labs Lab 08/20/16 0250 08/21/16 0400 08/22/16 0440 08/23/16 0430 08/24/16 0500  NA 141 144 147* 150* 147*  K 4.4 4.5 4.6 4.8 4.5  CL 104 104 103 102 106  CO2 26 31 37* 40* 34*  GLUCOSE 215* 224* 162* 207* 210*  BUN 24* 31* 41* 54* 39*  CREATININE 1.13 0.93 1.02 1.29* 0.91  CALCIUM 8.3* 8.2* 8.5* 8.4* 8.5*  MG  --  2.7* 3.1* 3.2* 2.8*  PHOS  --  4.0 3.7 5.4* 3.2   Estimated Creatinine Clearance: 105 mL/min (by C-G formula based on SCr of 0.91 mg/dL).  LIVER  Recent Labs Lab 08/18/16 0309  INR 1.20   INFECTIOUS  Recent Labs Lab 08/18/16 0218 08/18/16 0309 08/18/16 0844 08/19/16 1057  LATICACIDVEN 1.6  --  2.4* 1.8  PROCALCITON  --  0.34  --   --    ENDOCRINE CBG (last 3)   Recent Labs  08/23/16 2313 08/24/16 0411 08/24/16 0729  GLUCAP 226* 225* 224*   IMAGING x48h  - image(s) personally  visualized  -   highlighted in bold Ct Head Wo Contrast  Result Date: 08/23/2016 CLINICAL DATA:  Respiratory failure.  Lethargy EXAM: CT HEAD WITHOUT CONTRAST TECHNIQUE: Contiguous axial images were obtained from the base of the skull through the vertex without intravenous contrast. COMPARISON:  None. FINDINGS: Brain: No acute intracranial hemorrhage. No focal mass lesion. No CT evidence of acute infarction. No midline shift or mass effect. No hydrocephalus. Basilar cisterns are patent. Vascular: No hyperdense vessel or unexpected calcification. Skull: Normal. Negative for fracture or focal lesion. Sinuses/Orbits: Partial opacification mastoid air cells. Frontal sinus Other: None IMPRESSION: No acute intracranial findings. Electronically Signed   By: Genevive Bi M.D.   On: 08/23/2016 15:53   Dg Chest Port 1 View  Result Date:  08/24/2016 CLINICAL DATA:  Intubation. EXAM: PORTABLE CHEST 1 VIEW COMPARISON:  08/23/2016. FINDINGS: Endotracheal tube and NG tube in stable position. Right IJ line stable position. Cardiomegaly. Persistent unchanged bilateral pulmonary interstitial prominence. Persistent unchanged small left pleural effusion. No pneumothorax. IMPRESSION: 1.  Lines and tubes in stable position. 2. Persistent bilateral pulmonary interstitial edema and/or interstitial infiltrates again noted. Persistent small left pleural effusion. Chest is unchanged from prior exam. Electronically Signed   By: Maisie Fus  Register   On: 08/24/2016 07:14   Dg Chest Port 1 View  Result Date: 08/23/2016 CLINICAL DATA:  Intubation. EXAM: PORTABLE CHEST 1 VIEW COMPARISON:  08/22/2016.  CT 08/17/2016. FINDINGS: Endotracheal tube, NG tube, right IJ line in stable position. Heart size stable. Diffuse bilateral pulmonary interstitial edema and/or infiltrates again noted. No pneumothorax . IMPRESSION: 1. Lines and tubes in stable position. 2. Persistent bilateral pulmonary interstitial edema and/or infiltrates again noted.  No interim change. Heart size normal. Electronically Signed   By: Maisie Fus  Register   On: 08/23/2016 07:27   Dg Chest Port 1 View  Result Date: 08/22/2016 CLINICAL DATA:  Hypoxia, endotracheal tube repositioning EXAM: PORTABLE CHEST 1 VIEW COMPARISON:  08/22/2016 at 0516 hours FINDINGS: The tip of an endotracheal tube is seen in the proximal right mainstem bronchus and pullback of the tube is recommended about approximately 4.5 cm. Right IJ line central canal tip is seen in the mid SVC. Gastric tube extends below level of the diaphragm in the expected location of the stomach and proximal duodenum. Patchy airspace opacities at each lung base left greater right. Mild to moderate pulmonary vascular congestion. Heart is top-normal in size. The aorta is not aneurysmal. No suspicious osseous lesions. IMPRESSION: Malpositioned endotracheal tube in the right proximal mainstem bronchus. Pullback approximately 4.5 cm. Right IJ and gastric tubes are in satisfactory position. Relatively unchanged interstitial edema. Superimposed bibasilar airspace disease/ pneumonia not entirely excluded. Critical Value/emergent results were called by telephone at the time of interpretation on 08/22/2016 at 2:28 pm to Nurse Jacquenette Shone, who verbally acknowledged these results. Patient had ETT pulled back approximately 3 cm per discussion with followup CXR ordered. Electronically Signed   By: Tollie Eth M.D.   On: 08/22/2016 14:29    Summary:  55-y/o male with ILD and tobacco abuse history in acute respiratory failure after intubation for rotator cuff surgery. ALI with recent intubation for surgery vs pneumonia. ARDS. Had hypercarbia and decreased responsiveness off of sedation 10/25, which has improved 10/26 with pressure control vent settings.   ASSESSMENT / PLAN:  PULMONARY A: #Baseline - Chronic smoker - September 2016 with isolated reduction in diffusion capacity associated with emphysema and non-UIP pattern of interstitial lung  disease - not otherwise specified. No known ILD exposures per hx  #Admit 08/17/2016 - Abrupt respiratory illness with acute hypoxemic respiratory failure with significant deterioration 3 days after right shoulder surgery. Pulmonary embolism ruled out. Diffuse ground glass opacities on CT chest at admission  - Differential diagnosis includes: Undiagnosed IPF flare, acute lung injury postop idiopathic, Boop, acute eosinophilic pneumonia and viral induced ARDS (less likely as repeat negative)  #Current 08/24/16  - improving ARDS, off nimbex 10/24. On 60% fio2, peep 8.  - Trach cx with normal respiratory flora - BAL with lavage 10/24 unremarkable - Autoimmune and vasculitis labs - negative except for RF mildly elevated at 17.6  P:   ARDS NET protocol since 08/20/16 PCV given asynchrony and concern for auto-PEEP, decrease PS to 20 and increase PEEP to 10. Cotinue  abx for now Continue steroids until extubated but decrease to 60 mg IV q12. Ipratroprium and xopenex q6h  Diureses as ordered  CARDIOVASCULAR A:  MI ruled out but troponin increased to 0.07. Suspect demand. ECHO ok 10/22 - started on levophed, discontinued 10/23 Net negative 1.2 P:  Monitor Obtain q6h CVPs  RENAL A:   At risk for lactic acidosis and renal injury S/p diamox x 3 10/24 Free water deficit 2.9 L AKI resolved P:   D/C all diuretics Continue free water at 250 ml q6 hours for 3 doses. Lasix 40 mg IV q6 x3 doses. Replace electrolytes as indicated KVO IVF BMET in AM.  GASTROINTESTINAL A:   At risk for aspiration with intubation P:   NPO except meds TFs per nutrition  HEMATOLOGIC A:   At risk for anemia of critical illness P:  Monitor - stable DVT prophylaxis - lovenox  INFECTIOUS A:   Unclear if this is community acquired pneumonia P:   Check pro calcitonin --> 0.34 Continue broad antibiotic therapy >> vanc and zosyn (Day 6, Day 7 abx)  ENDOCRINE A:   At risk for hyperglycemia  - CBGs low  200s P:   ICU hyperglycemia protocol. Solumedrol 60 q6h (day 7). Added TF coverage. Lantus 10 units daily.  NEUROLOGIC A:   At risk for delirium Unresponsive on minimal sedation 10/25 -- suspect 2/2 hypercapnia. Head CT 10/25 without any acute changes P:   Monitor RASS goal: -5 with bIS goal < 50 Fent/versed gtt  Add propofol for 1 day  FAMILY  - Updates: Family updated at bedside 10/26, all questions answered.  The patient is critically ill with multiple organ systems failure and requires high complexity decision making for assessment and support, frequent evaluation and titration of therapies, application of advanced monitoring technologies and extensive interpretation of multiple databases.   Critical Care Time devoted to patient care services described in this note is  35  Minutes. This time reflects time of care of this signee Dr Koren BoundWesam Enna Warwick. This critical care time does not reflect procedure time, or teaching time or supervisory time of PA/NP/Med student/Med Resident etc but could involve care discussion time.  Alyson ReedyWesam G. Dudley Cooley, M.D. [facility]. Pager: [phone]. After hours pager: [phone].

## 2016-08-24 NOTE — Progress Notes (Signed)
Patient ID: Bradley FraiseDavid M [name], male   DOB: 1961/05/19, 55 y.o.   MRN: [id number] Post op rotator cuff repair , admitted with wheezing, CAP,has pulmonary fibrosis and still was smoking,  developed resp failure , intubated.  Right Shoulder incision looks good . steristrip change, RN to remove suture anterior and posterior from portals. Nothing else needed for shoulder at this point. Thank you for his care  [phone] my cell

## 2016-08-24 NOTE — Progress Notes (Signed)
PULMONARY / CRITICAL CARE MEDICINE   Name: Bradley [name] MRN: [id number] DOB: July 05, 1961    ADMISSION DATE:  08/17/2016 CONSULTATION DATE:  08/19/16  REFERRING MD:  Dr Thayer Ohm Rama  CHIEF COMPLAINT:  Acute resp failure with hypoxemia  BRIEF 55 year old smoker, with PFT nov 2016 showing isolated reduction in dlco to 59% and quite functional and not on home o2. Followed by Dr Sherene Sires -0 last seen Sept/Nov 2016 based on CT chest evidence showing diffuse changes of centrilobular and paraseptal emphysema along with prominent interstitial markings primarily in the upper lobes consistent with pulmonary fibrosis. This was a non--UIP pattern. Patient recommended quitting smoking because of concerns of DIP. According to the patient and his wife, has continued to smoke. He has been in his baseline status of health and very functional. Then on 08/14/2016 he had a chest x-ray preoperative that only showed chronic changes without change for a right shoulder surgery rotator cuff tear. The procedure was uneventful. Postoperative course was uneventful. There was no vomiting. Then abruptly on 08/16/2016 he started getting acutely ill with shortness of breath and cough with brown colored sputum. Then admitted to the hospital. Initial white count was 13,000. BNP was normal. CT angiogram chest ruled out pulmonary embolism but showed diffuse ground glass opacities and enlarged mediastinal node measuring close to 2 cm. He was started on community acquired pneumonia antibiotics but is developed progressive acute respiratory failure necessitating Solu-Medrol since 08/18/2016 evening. And from the early hours of 08/19/2016 he's been on BiPAP with significant respiratory distress and therefore pulmonary critical care has been consulted  At baseline he denies any mold exposure or mildew exposure or working with dust. He denies any birds in the house. He continues to smoke.   Events: 08/17/2016 - admit 08/18/2016-respiratory  virus panel, HIV and flu panel PCR negative 08/19/16 -bipap, move to icu -> intubated 10/21 10/22 repeat RVP from trach aspirate - negative 10/23 levo off 10/24 nimbex off  SUBJECTIVE/OVERNIGHT/INTERVAL HX 08/20/16 -> Intubated overnight (DIFFICULT). On ARDS protocol with nimbex now. ECHO: LVEF 60-65%. normal wall thickness, normal wall motion, normal LA size, moderate TR, RVSP 56 mmHg (moderate pulmonary hypertension), estimated RA pressure of 8 mmHg. 10/23 --> improved settings on vent. Levo off.  10/24 --> P to F ratio improved to 205 with today's ABG; CVP 5; FiO2 40, PEEP 10, RR 35, TV 440; paralyzed with nimbex 2 mcg 10/25 --> RR adjusted from 35 to 30 overnight due to respiratory acidosis. Did not improve gas, so RR decreased to 30 again. P/F ratios of 275 and 171, respectively.  10/26 --> ABGs improved on pressure control vent settings. FiO2 increased from 40 to 60 overnight due to desaturations. Decreased to 50 this a.m. Febrile to 100.4 F yesterday.   VITAL SIGNS: BP 129/86   Pulse (!) 104   Temp 100.2 F (37.9 C) (Oral)   Resp (!) 28   Ht 5\' 9"  (1.753 m)   Wt 212 lb 1.3 oz (96.2 kg)   SpO2 93%   BMI 31.32 kg/m   HEMODYNAMICS: CVP:  [4 mmHg-15 mmHg] 14 mmHg  VENTILATOR SETTINGS: Vent Mode: PCV FiO2 (%):  [40 %-70 %] 60 % Set Rate:  [35 bmp] 35 bmp PEEP:  [8 cmH20-14 cmH20] 8 cmH20 Plateau Pressure:  [17 cmH20-25 cmH20] 25 cmH20  INTAKE / OUTPUT: I/O last 3 completed shifts: In: 5438.8 [I.V.:844.3; WU/JW:1191.4; IV Piggyback:1050] Out: 5125 [Urine:5125]   Lines: Foley 10/21 >> ETT and OG 10/21 >> CVC 10/22 >>  PIV x 3 A-line left radial 10/22 >>  PHYSICAL EXAMINATION: General:  Middle-aged, sedated, ETT in place Neuro: Sedated, not responding to stimulation but on fentanyl 200 for agitation this am HEENT:  ETT in place Cardiovascular:  Regular rate and rhythm Lungs: Decreased BS at bases Abdomen:  Soft nontender Musculoskeletal:  No cyanosis no clubbing no  edema. Looks euvolemic; clean dry bandage over R shoulder Skin:  Intact on exposed areas  LABS: PULMONARY  Recent Labs Lab 08/20/16 0042 08/20/16 0318  08/20/16 1143  08/23/16 0306 08/23/16 0430 08/23/16 1302 08/23/16 1700 08/24/16 0405  PHART 7.174* 7.197*  < > 7.275*  < > 7.284* 7.265* 7.399 7.395 7.367  PCO2ART 88.2* 75.6*  < > 68.4*  < > 90.1* 96.6* 70.1* 56.4* 60.9*  PO2ART 337.0* 176.0*  < > 78.0*  < > 220* 137* 260.0* 104.0 101  HCO3 32.8* 29.6*  < > 31.9*  < > 41.2* 41.9* 42.9* 34.2* 34.1*  TCO2 36 32  --  34  --   --   --  45 36  --   O2SAT 100.0 99.0  < > 93.0  < > 99.1 98.2 100.0 98.0 97.1  < > = values in this interval not displayed. CBC  Recent Labs Lab 08/22/16 0440 08/23/16 0430 08/24/16 0500  HGB 12.3* 13.5 12.7*  HCT 39.5 45.6 41.3  WBC 10.5 15.9* 16.7*  PLT 207 210 189   COAGULATION  Recent Labs Lab 08/18/16 0309  INR 1.20   CARDIAC    Recent Labs Lab 08/19/16 1057 08/19/16 1900 08/20/16 0250  TROPONINI <0.03 <0.03 0.07*   No results for input(s): PROBNP in the last 168 hours.   CHEMISTRY  Recent Labs Lab 08/20/16 0250 08/21/16 0400 08/22/16 0440 08/23/16 0430 08/24/16 0500  NA 141 144 147* 150* 147*  K 4.4 4.5 4.6 4.8 4.5  CL 104 104 103 102 106  CO2 26 31 37* 40* 34*  GLUCOSE 215* 224* 162* 207* 210*  BUN 24* 31* 41* 54* 39*  CREATININE 1.13 0.93 1.02 1.29* 0.91  CALCIUM 8.3* 8.2* 8.5* 8.4* 8.5*  MG  --  2.7* 3.1* 3.2* 2.8*  PHOS  --  4.0 3.7 5.4* 3.2   Estimated Creatinine Clearance: 105 mL/min (by C-G formula based on SCr of 0.91 mg/dL).  LIVER  Recent Labs Lab 08/18/16 0309  INR 1.20     INFECTIOUS  Recent Labs Lab 08/18/16 0218 08/18/16 0309 08/18/16 0844 08/19/16 1057  LATICACIDVEN 1.6  --  2.4* 1.8  PROCALCITON  --  0.34  --   --      ENDOCRINE CBG (last 3)   Recent Labs  08/23/16 2018 08/23/16 2313 08/24/16 0411  GLUCAP 246* 226* 225*     IMAGING x48h  - image(s) personally  visualized  -   highlighted in bold Ct Head Wo Contrast  Result Date: 08/23/2016 CLINICAL DATA:  Respiratory failure.  Lethargy EXAM: CT HEAD WITHOUT CONTRAST TECHNIQUE: Contiguous axial images were obtained from the base of the skull through the vertex without intravenous contrast. COMPARISON:  None. FINDINGS: Brain: No acute intracranial hemorrhage. No focal mass lesion. No CT evidence of acute infarction. No midline shift or mass effect. No hydrocephalus. Basilar cisterns are patent. Vascular: No hyperdense vessel or unexpected calcification. Skull: Normal. Negative for fracture or focal lesion. Sinuses/Orbits: Partial opacification mastoid air cells. Frontal sinus Other: None IMPRESSION: No acute intracranial findings. Electronically Signed   By: Genevive BiStewart  Edmunds M.D.   On: 08/23/2016 15:53  Dg Chest Port 1 View  Result Date: 08/24/2016 CLINICAL DATA:  Intubation. EXAM: PORTABLE CHEST 1 VIEW COMPARISON:  08/23/2016. FINDINGS: Endotracheal tube and NG tube in stable position. Right IJ line stable position. Cardiomegaly. Persistent unchanged bilateral pulmonary interstitial prominence. Persistent unchanged small left pleural effusion. No pneumothorax. IMPRESSION: 1.  Lines and tubes in stable position. 2. Persistent bilateral pulmonary interstitial edema and/or interstitial infiltrates again noted. Persistent small left pleural effusion. Chest is unchanged from prior exam. Electronically Signed   By: Maisie Fus  Register   On: 08/24/2016 07:14   Dg Chest Port 1 View  Result Date: 08/23/2016 CLINICAL DATA:  Intubation. EXAM: PORTABLE CHEST 1 VIEW COMPARISON:  08/22/2016.  CT 08/17/2016. FINDINGS: Endotracheal tube, NG tube, right IJ line in stable position. Heart size stable. Diffuse bilateral pulmonary interstitial edema and/or infiltrates again noted. No pneumothorax . IMPRESSION: 1. Lines and tubes in stable position. 2. Persistent bilateral pulmonary interstitial edema and/or infiltrates again noted.  No interim change. Heart size normal. Electronically Signed   By: Maisie Fus  Register   On: 08/23/2016 07:27   Dg Chest Port 1 View  Result Date: 08/22/2016 CLINICAL DATA:  Hypoxia, endotracheal tube repositioning EXAM: PORTABLE CHEST 1 VIEW COMPARISON:  08/22/2016 at 0516 hours FINDINGS: The tip of an endotracheal tube is seen in the proximal right mainstem bronchus and pullback of the tube is recommended about approximately 4.5 cm. Right IJ line central canal tip is seen in the mid SVC. Gastric tube extends below level of the diaphragm in the expected location of the stomach and proximal duodenum. Patchy airspace opacities at each lung base left greater right. Mild to moderate pulmonary vascular congestion. Heart is top-normal in size. The aorta is not aneurysmal. No suspicious osseous lesions. IMPRESSION: Malpositioned endotracheal tube in the right proximal mainstem bronchus. Pullback approximately 4.5 cm. Right IJ and gastric tubes are in satisfactory position. Relatively unchanged interstitial edema. Superimposed bibasilar airspace disease/ pneumonia not entirely excluded. Critical Value/emergent results were called by telephone at the time of interpretation on 08/22/2016 at 2:28 pm to Nurse Jacquenette Shone, who verbally acknowledged these results. Patient had ETT pulled back approximately 3 cm per discussion with followup CXR ordered. Electronically Signed   By: Tollie Eth M.D.   On: 08/22/2016 14:29    Summary:  55-y/o male with ILD and tobacco abuse history in acute respiratory failure after intubation for rotator cuff surgery. ALI with recent intubation for surgery vs pneumonia. ARDS. Had hypercarbia and decreased responsiveness off of sedation 10/25, which has improved 10/26 with pressure control vent settings.   ASSESSMENT / PLAN:  PULMONARY A: #Baseline - Chronic smoker - September 2016 with isolated reduction in diffusion capacity associated with emphysema and non-UIP pattern of interstitial lung  disease - not otherwise specified. No known ILD exposures per hx  #Admit 08/17/2016 - Abrupt respiratory illness with acute hypoxemic respiratory failure with significant deterioration 3 days after right shoulder surgery. Pulmonary embolism ruled out. Diffuse ground glass opacities on CT chest at admission  - Differential diagnosis includes: Undiagnosed IPF flare, acute lung injury postop idiopathic, Boop, acute eosinophilic pneumonia and viral induced ARDS (less likely as repeat negative)  #Current 08/24/16  - improving ARDS, off nimbex 10/24. On 60% fio2, peep 8.  - Trach cx with normal respiratory flora - BAL with lavage 10/24 unremarkable - Autoimmune and vasculitis labs - negative except for RF mildly elevated at 17.6  P:   ARDS NET protocol since 08/20/16 Changed to PCV given asynchrony and concern for auto-PEEP  10/26 Cotinue abx for now Continue steroids until extubated Ipratroprium and xopenex q6h  Diureses as ordered  CARDIOVASCULAR A:  MI ruled out but troponin increased to 0.07. Suspect demand. ECHO ok 10/22 - started on levophed, discontinued 10/23 Net negative 1.2 P:  Monitor Obtain q6h CVPs  RENAL A:   At risk for lactic acidosis and renal injury S/p diamox x 3 10/24 Free water deficit 2.9 L AKI resolved P:   D/C all diuretics Continue free water at 250 ml q3 hours Replace electrolytes as indicated KVO IVF BMET in AM.  GASTROINTESTINAL A:   At risk for aspiration with intubation P:   NPO except meds TFs per nutrition  HEMATOLOGIC A:   At risk for anemia of critical illness P:  Monitor - stable DVT prophylaxis - lovenox  INFECTIOUS A:   Unclear if this is community acquired pneumonia P:   Check pro calcitonin --> 0.34 Continue broad antibiotic therapy >> vanc and zosyn (Day 6, Day 7 abx)  ENDOCRINE A:   At risk for hyperglycemia  - CBGs low 200s P:   ICU hyperglycemia protocol Solumedrol 60 q6h (day 6) Added TF  coverage  NEUROLOGIC A:   At risk for delirium Unresponsive on minimal sedation 10/25 -- suspect 2/2 hypercapnia. Head CT 10/25 without any acute changes P:   Monitor RASS goal: -5 with bIS goal < 50 Fent/versed gtt   FAMILY  - Updates: Family updated at bedside 10/25.  Dr. Molli Knock spoke with wife at length, if no improvement by today (10/26) then will need to decide on trach/peg vs comfort.  Patient did not want trach/peg per wife. However, patient showing progress.   Dani Gobble, MD [facility], PGY-2  Alyson Reedy, M.D. [facility] Pulmonary/Critical Care Medicine. Pager: [phone]. After hours pager: [phone].

## 2016-08-25 ENCOUNTER — Inpatient Hospital Stay (HOSPITAL_COMMUNITY): Payer: BLUE CROSS/BLUE SHIELD

## 2016-08-25 LAB — CBC
HEMATOCRIT: 44.7 % (ref 39.0–52.0)
HEMOGLOBIN: 14 g/dL (ref 13.0–17.0)
MCH: 30.4 pg (ref 26.0–34.0)
MCHC: 31.3 g/dL (ref 30.0–36.0)
MCV: 97.2 fL (ref 78.0–100.0)
Platelets: 195 10*3/uL (ref 150–400)
RBC: 4.6 MIL/uL (ref 4.22–5.81)
RDW: 14.8 % (ref 11.5–15.5)
WBC: 21.3 10*3/uL — ABNORMAL HIGH (ref 4.0–10.5)

## 2016-08-25 LAB — GLUCOSE, CAPILLARY
GLUCOSE-CAPILLARY: 190 mg/dL — AB (ref 65–99)
GLUCOSE-CAPILLARY: 161 mg/dL — AB (ref 65–99)
Glucose-Capillary: 262 mg/dL — ABNORMAL HIGH (ref 65–99)
Glucose-Capillary: 215 mg/dL — ABNORMAL HIGH (ref 65–99)
Glucose-Capillary: 209 mg/dL — ABNORMAL HIGH (ref 65–99)
Glucose-Capillary: 282 mg/dL — ABNORMAL HIGH (ref 65–99)
Glucose-Capillary: 252 mg/dL — ABNORMAL HIGH (ref 65–99)

## 2016-08-25 LAB — RENAL FUNCTION PANEL
ALBUMIN: 2.8 g/dL — AB (ref 3.5–5.0)
ANION GAP: 8 (ref 5–15)
BUN: 54 mg/dL — ABNORMAL HIGH (ref 6–20)
CO2: 34 mmol/L — ABNORMAL HIGH (ref 22–32)
Calcium: 8.8 mg/dL — ABNORMAL LOW (ref 8.9–10.3)
Chloride: 101 mmol/L (ref 101–111)
Creatinine, Ser: 1.12 mg/dL (ref 0.61–1.24)
GFR calc Af Amer: >60 mL/min (ref >60)
GLUCOSE: 205 mg/dL — AB (ref 65–99)
PHOSPHORUS: 5.7 mg/dL — AB (ref 2.5–4.6)
POTASSIUM: 4.2 mmol/L (ref 3.5–5.1)
Sodium: 143 mmol/L (ref 135–145)

## 2016-08-25 LAB — POCT I-STAT 3, ART BLOOD GAS (G3+)
Acid-Base Excess: 10 mmol/L — ABNORMAL HIGH (ref 0.0–2.0)
Bicarbonate: 36.7 mmol/L — ABNORMAL HIGH (ref 20.0–28.0)
O2 Saturation: 96 %
PH ART: 7.388 (ref 7.350–7.450)
TCO2: 39 mmol/L (ref 0–100)
pCO2 arterial: 61.7 mmHg — ABNORMAL HIGH (ref 32.0–48.0)
pO2, Arterial: 91 mmHg (ref 83.0–108.0)

## 2016-08-25 LAB — MAGNESIUM: MAGNESIUM: 2.7 mg/dL — AB (ref 1.7–2.4)

## 2016-08-25 MED ORDER — POLYETHYLENE GLYCOL 3350 17 G PO PACK
17.0000 g | PACK | Freq: Every day | ORAL | Status: DC
  Administered 2016-08-25 – 2016-08-26 (×2): 17 g via ORAL
  Filled 2016-08-25 (×5): qty 1

## 2016-08-25 MED ORDER — INSULIN GLARGINE 100 UNIT/ML ~~LOC~~ SOLN
5.0000 [IU] | Freq: Every day | SUBCUTANEOUS | Status: AC
  Administered 2016-08-25: 5 [IU] via SUBCUTANEOUS
  Filled 2016-08-25: qty 0.05

## 2016-08-25 MED ORDER — METOLAZONE 5 MG PO TABS
5.0000 mg | ORAL_TABLET | Freq: Every day | ORAL | Status: AC
  Administered 2016-08-25: 5 mg via ORAL
  Filled 2016-08-25: qty 1

## 2016-08-25 MED ORDER — SENNOSIDES-DOCUSATE SODIUM 8.6-50 MG PO TABS
1.0000 | ORAL_TABLET | Freq: Every day | ORAL | Status: DC
  Filled 2016-08-25: qty 1

## 2016-08-25 MED ORDER — INSULIN GLARGINE 100 UNIT/ML ~~LOC~~ SOLN
15.0000 [IU] | Freq: Every day | SUBCUTANEOUS | Status: DC
  Administered 2016-08-26 – 2016-08-27 (×2): 15 [IU] via SUBCUTANEOUS
  Filled 2016-08-25 (×3): qty 0.15

## 2016-08-25 MED ORDER — FUROSEMIDE 10 MG/ML IJ SOLN
40.0000 mg | Freq: Four times a day (QID) | INTRAMUSCULAR | Status: AC
  Administered 2016-08-25 (×3): 40 mg via INTRAVENOUS
  Filled 2016-08-25 (×3): qty 4

## 2016-08-25 NOTE — Progress Notes (Signed)
Inpatient Diabetes Program Recommendations  AACE/ADA: New Consensus Statement on Inpatient Glycemic Control (2015)  Target Ranges:  Prepandial:   less than 140 mg/dL      Peak postprandial:   less than 180 mg/dL (1-2 hours)      Critically ill patients:  140 - 180 mg/dL   Lab Results  Component Value Date   GLUCAP 282 (H) 08/25/2016    Review of Glycemic Control:  Results for [name], Cambren M (MRN [id number]) as of 08/25/2016 13:24  Ref. Range 08/24/2016 20:06 08/24/2016 23:39 08/25/2016 03:49 08/25/2016 07:18 08/25/2016 11:00  Glucose-Capillary Latest Ref Range: 65 - 99 mg/dL 782262 (H) 956253 (H) 213215 (H) 209 (H) 282 (H)   Diabetes history: None Outpatient Diabetes medications: None Current orders for Inpatient glycemic control: Novolog resistant q 4 hours, Novolog 3 units q 4 hours, Lantus 15 units daily  Inpatient Diabetes Program Recommendations:    CBG's continue to be elevated greater than goal despite changes in insulin.  May consider IV insulin-ICU glycemic control order set if appropriate.   Thanks, Beryl MeagerJenny Milly Goggins, RN, BC-ADM Inpatient Diabetes Coordinator Pager [phone] (8a-5p)

## 2016-08-25 NOTE — Progress Notes (Signed)
PULMONARY / CRITICAL CARE MEDICINE   Name: Bradley [name] MRN: [id number] DOB: [birth year]    ADMISSION DATE:  08/17/2016 CONSULTATION DATE:  08/19/16  REFERRING MD:  Dr Thayer Ohm Rama  CHIEF COMPLAINT:  Acute resp failure with hypoxemia  BRIEF 55 year old smoker, with PFT nov 2016 showing isolated reduction in dlco to 59% and quite functional and not on home o2. Followed by Dr Sherene Sires -0 last seen Sept/Nov 2016 based on CT chest evidence showing diffuse changes of centrilobular and paraseptal emphysema along with prominent interstitial markings primarily in the upper lobes consistent with pulmonary fibrosis. This was a non--UIP pattern. Patient recommended quitting smoking because of concerns of DIP. According to the patient and his wife, has continued to smoke. He has been in his baseline status of health and very functional. Then on 08/14/2016 he had a chest x-ray preoperative that only showed chronic changes without change for a right shoulder surgery rotator cuff tear. The procedure was uneventful. Postoperative course was uneventful. There was no vomiting. Then abruptly on 08/16/2016 he started getting acutely ill with shortness of breath and cough with brown colored sputum. Then admitted to the hospital. Initial white count was 13,000. BNP was normal. CT angiogram chest ruled out pulmonary embolism but showed diffuse ground glass opacities and enlarged mediastinal node measuring close to 2 cm. He was started on community acquired pneumonia antibiotics but is developed progressive acute respiratory failure necessitating Solu-Medrol since 08/18/2016 evening. And from the early hours of 08/19/2016 he's been on BiPAP with significant respiratory distress and therefore pulmonary critical care has been consulted  At baseline he denies any mold exposure or mildew exposure or working with dust. He denies any birds in the house. He continues to smoke.   Events: 08/17/2016 - admit 08/18/2016-respiratory  virus panel, HIV and flu panel PCR negative 08/19/16 -bipap, move to icu -> intubated 10/21 10/22 repeat RVP from trach aspirate - negative 10/23 levo off 10/24 nimbex off  SUBJECTIVE/OVERNIGHT/INTERVAL HX 08/20/16 -> Intubated overnight (DIFFICULT). On ARDS protocol with nimbex now. ECHO: LVEF 60-65%. normal wall thickness, normal wall motion, normal LA size, moderate TR, RVSP 56 mmHg (moderate pulmonary hypertension), estimated RA pressure of 8 mmHg. 10/23 --> improved settings on vent. Levo off.  10/24 --> P to F ratio improved to 205 with today's ABG; CVP 5; FiO2 40, PEEP 10, RR 35, TV 440; paralyzed with nimbex 2 mcg 10/25 --> RR adjusted from 35 to 30 overnight due to respiratory acidosis. Did not improve gas, so RR decreased to 30 again. P/F ratios of 275 and 171, respectively.  10/26 --> ABGs improved on pressure control vent settings. FiO2 increased from 40 to 60 overnight due to desaturations. Decreased to 50 this a.m. Febrile to 100.4 F yesterday.   VITAL SIGNS: BP 110/82   Pulse (!) 113   Temp (!) 102 F (38.9 C) (Oral) Comment: Notified RN- DeDe  Resp (!) 36   Ht 5\' 9"  (1.753 m)   Wt 96.2 kg (212 lb 1.3 oz)   SpO2 97%   BMI 31.32 kg/m   HEMODYNAMICS: CVP:  [9 mmHg-15 mmHg] 15 mmHg  VENTILATOR SETTINGS: Vent Mode: PCV FiO2 (%):  [50 %] 50 % Set Rate:  [35 bmp] 35 bmp PEEP:  [10 cmH20] 10 cmH20 Plateau Pressure:  [22 cmH20-28 cmH20] 25 cmH20  INTAKE / OUTPUT: I/O last 3 completed shifts: In: 51 [I.V.:917; NG/GT:3045; IV Piggyback:600] Out: 5240 [Urine:5140; Emesis/NG output:100]   Abx: Zosyn 10/21>>> Vanc 10/21>>>10/26  Lines: Foley  10/21 >> ETT and OG 10/21 >> CVC 10/22 >> PIV x 3 A-line left radial 10/22 >>  PHYSICAL EXAMINATION: General:  Middle-aged, sedated, ETT in place Neuro: Sedated, arousable HEENT:  ETT in place Cardiovascular:  Regular rate and rhythm Lungs: Decreased BS at bases Abdomen:  Soft nontender Musculoskeletal:  No cyanosis  no clubbing no edema. Looks euvolemic; clean dry bandage over R shoulder Skin:  Intact on exposed areas  LABS:  PULMONARY  Recent Labs Lab 08/20/16 1143  08/23/16 1302 08/23/16 1700 08/24/16 0405 08/24/16 1405 08/25/16 0249  PHART 7.275*  < > 7.399 7.395 7.367 7.384 7.388  PCO2ART 68.4*  < > 70.1* 56.4* 60.9* 61.8* 61.7*  PO2ART 78.0*  < > 260.0* 104.0 101 97.0 91.0  HCO3 31.9*  < > 42.9* 34.2* 34.1* 36.7* 36.7*  TCO2 34  --  45 36  --  38 39  O2SAT 93.0  < > 100.0 98.0 97.1 97.0 96.0  < > = values in this interval not displayed. CBC  Recent Labs Lab 08/23/16 0430 08/24/16 0500 08/25/16 0443  HGB 13.5 12.7* 14.0  HCT 45.6 41.3 44.7  WBC 15.9* 16.7* 21.3*  PLT 210 189 195   COAGULATION No results for input(s): INR in the last 168 hours. CARDIAC    Recent Labs Lab 08/19/16 1057 08/19/16 1900 08/20/16 0250  TROPONINI <0.03 <0.03 0.07*   No results for input(s): PROBNP in the last 168 hours.  CHEMISTRY  Recent Labs Lab 08/21/16 0400 08/22/16 0440 08/23/16 0430 08/24/16 0500 08/25/16 0443 08/25/16 0450  NA 144 147* 150* 147*  --  143  K 4.5 4.6 4.8 4.5  --  4.2  CL 104 103 102 106  --  101  CO2 31 37* 40* 34*  --  34*  GLUCOSE 224* 162* 207* 210*  --  205*  BUN 31* 41* 54* 39*  --  54*  CREATININE 0.93 1.02 1.29* 0.91  --  1.12  CALCIUM 8.2* 8.5* 8.4* 8.5*  --  8.8*  MG 2.7* 3.1* 3.2* 2.8* 2.7*  --   PHOS 4.0 3.7 5.4* 3.2  --  5.7*   Estimated Creatinine Clearance: 85.3 mL/min (by C-G formula based on SCr of 1.12 mg/dL).  LIVER  Recent Labs Lab 08/25/16 0450  ALBUMIN 2.8*   INFECTIOUS  Recent Labs Lab 08/19/16 1057  LATICACIDVEN 1.8   ENDOCRINE CBG (last 3)   Recent Labs  08/24/16 2339 08/25/16 0349 08/25/16 0718  GLUCAP 253* 215* 209*   IMAGING x48h  - image(s) personally visualized  -   highlighted in bold Ct Head Wo Contrast  Result Date: 08/23/2016 CLINICAL DATA:  Respiratory failure.  Lethargy EXAM: CT HEAD WITHOUT  CONTRAST TECHNIQUE: Contiguous axial images were obtained from the base of the skull through the vertex without intravenous contrast. COMPARISON:  None. FINDINGS: Brain: No acute intracranial hemorrhage. No focal mass lesion. No CT evidence of acute infarction. No midline shift or mass effect. No hydrocephalus. Basilar cisterns are patent. Vascular: No hyperdense vessel or unexpected calcification. Skull: Normal. Negative for fracture or focal lesion. Sinuses/Orbits: Partial opacification mastoid air cells. Frontal sinus Other: None IMPRESSION: No acute intracranial findings. Electronically Signed   By: Genevive Bi M.D.   On: 08/23/2016 15:53   Dg Chest Port 1 View  Result Date: 08/25/2016 CLINICAL DATA:  Intubation. EXAM: PORTABLE CHEST 1 VIEW COMPARISON:  08/24/2016 FINDINGS: Endotracheal tube has tip 8.1 cm above the carina. Nasogastric tube courses through the stomach and in over the expected  region of the proximal duodenum inferior portion of the film as tip is not visualized. Right IJ central venous catheter unchanged. Lungs are hypoinflated demonstrate mild bibasilar opacification with improved aeration in the left base as findings likely due to atelectasis and possible small amount of pleural fluid. Cardiomediastinal silhouette and remainder of the exam is unchanged. IMPRESSION: Persistent bibasilar opacification with interval improved aeration in the left base likely atelectasis and possible small amount of pleural fluid. Tubes and lines as described. Electronically Signed   By: Elberta Fortisaniel  Boyle M.D.   On: 08/25/2016 07:11   Dg Chest Port 1 View  Result Date: 08/24/2016 CLINICAL DATA:  Intubation. EXAM: PORTABLE CHEST 1 VIEW COMPARISON:  08/23/2016. FINDINGS: Endotracheal tube and NG tube in stable position. Right IJ line stable position. Cardiomegaly. Persistent unchanged bilateral pulmonary interstitial prominence. Persistent unchanged small left pleural effusion. No pneumothorax. IMPRESSION:  1.  Lines and tubes in stable position. 2. Persistent bilateral pulmonary interstitial edema and/or interstitial infiltrates again noted. Persistent small left pleural effusion. Chest is unchanged from prior exam. 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 No known ILD exposures per hx  #Admit 08/17/2016 - Abrupt respiratory illness with acute hypoxemic respiratory failure with significant deterioration 3 days after right shoulder surgery. Pulmonary embolism ruled out. Diffuse ground glass opacities on CT chest at admission  - Differential diagnosis includes: Undiagnosed IPF flare, acute lung injury postop idiopathic, Boop, acute eosinophilic pneumonia and viral induced ARDS (less likely as repeat negative)  #Current 08/24/16  - improving ARDS, off nimbex 10/24. On 60% fio2, peep 8.  - Trach cx with normal respiratory flora - BAL with lavage 10/24 unremarkable - Autoimmune and vasculitis labs - negative except for RF mildly elevated at 17.6  P:   ARDS NET protocol since 08/20/16. PCV given asynchrony and concern for auto-PEEP, decrease PS to 20 and increase PEEP to 10. Finish 8 days of abx and monitor off. Continue steroids until extubated but decreased to 60 mg IV q12. Ipratroprium and xopenex q6h. Diureses as ordered.  CARDIOVASCULAR A:  MI ruled out but troponin increased to 0.07. Suspect demand. ECHO ok 10/22 - started on levophed, discontinued 10/23 Net negative 1.2 P:  Monitor Obtain q6h CVPs  RENAL A:   At risk for lactic acidosis and renal injury S/p diamox x 3 10/24 Free water deficit 2.9 L AKI resolved P:   Continue free water at 250 ml q6 hours for 3 doses. Lasix 40 mg IV q6 x3 doses. Zaroxolyn 5 mg PO  x1. Replace electrolytes as indicated. KVO IVF. BMET in AM.  GASTROINTESTINAL A:   At risk for aspiration with intubation P:   NPO except meds TFs per nutrition  HEMATOLOGIC A:   At risk for anemia of critical illness P:  Monitor - stable. DVT prophylaxis - lovenox.  INFECTIOUS A:   Unclear if this is community acquired pneumonia P:   Check pro calcitonin --> 0.34 Continue broad antibiotic therapy >> vanc and zosyn (Day 7, Day 8 abx)  ENDOCRINE A:   At risk for hyperglycemia  - CBGs low 200s P:   ICU hyperglycemia protocol. Solumedrol 60 q6h (day 8). Added TF coverage. Increase Lantus to 15 units daily.  NEUROLOGIC A:   At risk for delirium Unresponsive  on minimal sedation 10/25 -- suspect 2/2 hypercapnia. Head CT 10/25 without any acute changes P:   Monitor RASS goal: -2 Fentanyl drip Versed pushes D/C propofol.  FAMILY  - Updates: No family bedside  The patient is critically ill with multiple organ systems failure and requires high complexity decision making for assessment and support, frequent evaluation and titration of therapies, application of advanced monitoring technologies and extensive interpretation of multiple databases.   Critical Care Time devoted to patient care services described in this note is  35  Minutes. This time reflects time of care of this signee Dr Koren Bound. This critical care time does not reflect procedure time, or teaching time or supervisory time of PA/NP/Med student/Med Resident etc but could involve care discussion time.  Alyson Reedy, M.D. [facility] Pulmonary/Critical Care Medicine. Pager: [phone]. After hours pager: [phone].

## 2016-08-25 NOTE — Progress Notes (Addendum)
Advanced ET tube 3 cm from 23-26 cm per Dr Molli KnockYacoub and AM X-ray report.

## 2016-08-26 ENCOUNTER — Inpatient Hospital Stay (HOSPITAL_COMMUNITY): Payer: BLUE CROSS/BLUE SHIELD

## 2016-08-26 LAB — CBC
HCT: 43.2 % (ref 39.0–52.0)
HEMOGLOBIN: 13.8 g/dL (ref 13.0–17.0)
MCH: 30.4 pg (ref 26.0–34.0)
MCHC: 31.9 g/dL (ref 30.0–36.0)
MCV: 95.2 fL (ref 78.0–100.0)
Platelets: 154 10*3/uL (ref 150–400)
RBC: 4.54 MIL/uL (ref 4.22–5.81)
RDW: 14.7 % (ref 11.5–15.5)
WBC: 20.5 10*3/uL — ABNORMAL HIGH (ref 4.0–10.5)

## 2016-08-26 LAB — BLOOD GAS, ARTERIAL
ACID-BASE EXCESS: 9 mmol/L — AB (ref 0.0–2.0)
Acid-Base Excess: 11.6 mmol/L — ABNORMAL HIGH (ref 0.0–2.0)
BICARBONATE: 33.2 mmol/L — AB (ref 20.0–28.0)
Bicarbonate: 35.5 mmol/L — ABNORMAL HIGH (ref 20.0–28.0)
DRAWN BY: 441371
Drawn by: 398661
FIO2: 40
FIO2: 0.4
LHR: 35 {breaths}/min
LHR: 28 {breaths}/min
O2 Saturation: 98.4 %
O2 Saturation: 96.5 %
PEEP/CPAP: 10 cmH2O
PEEP: 5 cmH2O
PH ART: 7.454 — AB (ref 7.350–7.450)
PRESSURE CONTROL: 20 cmH2O
Patient temperature: 98.6
Patient temperature: 99.8
Pressure control: 20 cmH2O
pCO2 arterial: 48.1 mmHg — ABNORMAL HIGH (ref 32.0–48.0)
pCO2 arterial: 47.1 mmHg (ref 32.0–48.0)
pH, Arterial: 7.49 — ABNORMAL HIGH (ref 7.350–7.450)
pO2, Arterial: 123 mmHg — ABNORMAL HIGH (ref 83.0–108.0)
pO2, Arterial: 89.1 mmHg (ref 83.0–108.0)

## 2016-08-26 LAB — GLUCOSE, CAPILLARY
GLUCOSE-CAPILLARY: 292 mg/dL — AB (ref 65–99)
GLUCOSE-CAPILLARY: 321 mg/dL — AB (ref 65–99)
Glucose-Capillary: 179 mg/dL — ABNORMAL HIGH (ref 65–99)
Glucose-Capillary: 202 mg/dL — ABNORMAL HIGH (ref 65–99)
Glucose-Capillary: 212 mg/dL — ABNORMAL HIGH (ref 65–99)
Glucose-Capillary: 221 mg/dL — ABNORMAL HIGH (ref 65–99)

## 2016-08-26 LAB — PHOSPHORUS: PHOSPHORUS: 4.2 mg/dL (ref 2.5–4.6)

## 2016-08-26 LAB — BASIC METABOLIC PANEL
Anion gap: 8 (ref 5–15)
BUN: 47 mg/dL — AB (ref 6–20)
CHLORIDE: 103 mmol/L (ref 101–111)
CO2: 32 mmol/L (ref 22–32)
Calcium: 8.6 mg/dL — ABNORMAL LOW (ref 8.9–10.3)
Creatinine, Ser: 0.95 mg/dL (ref 0.61–1.24)
GFR calc Af Amer: >60 mL/min (ref >60)
GFR calc non Af Amer: >60 mL/min (ref >60)
Glucose, Bld: 188 mg/dL — ABNORMAL HIGH (ref 65–99)
POTASSIUM: 3.5 mmol/L (ref 3.5–5.1)
SODIUM: 143 mmol/L (ref 135–145)

## 2016-08-26 LAB — MAGNESIUM: MAGNESIUM: 2.4 mg/dL (ref 1.7–2.4)

## 2016-08-26 MED ORDER — POTASSIUM CHLORIDE 20 MEQ/15ML (10%) PO SOLN
40.0000 meq | Freq: Three times a day (TID) | ORAL | Status: AC
  Administered 2016-08-26 (×2): 40 meq
  Filled 2016-08-26 (×3): qty 30

## 2016-08-26 MED ORDER — FUROSEMIDE 10 MG/ML IJ SOLN
40.0000 mg | Freq: Once | INTRAMUSCULAR | Status: AC
  Administered 2016-08-26: 40 mg via INTRAVENOUS
  Filled 2016-08-26: qty 4

## 2016-08-26 MED ORDER — PIPERACILLIN-TAZOBACTAM 3.375 G IVPB
3.3750 g | Freq: Three times a day (TID) | INTRAVENOUS | Status: DC
  Administered 2016-08-26 – 2016-08-28 (×6): 3.375 g via INTRAVENOUS
  Filled 2016-08-26 (×8): qty 50

## 2016-08-26 MED ORDER — FUROSEMIDE 10 MG/ML IJ SOLN
20.0000 mg | Freq: Three times a day (TID) | INTRAMUSCULAR | Status: AC
  Administered 2016-08-26 (×2): 20 mg via INTRAVENOUS
  Filled 2016-08-26 (×3): qty 2

## 2016-08-26 MED ORDER — FREE WATER
250.0000 mL | Status: DC
  Administered 2016-08-26 – 2016-08-29 (×18): 250 mL

## 2016-08-26 NOTE — Progress Notes (Signed)
Pharmacy Antibiotic Note  Bradley [name] is a 55 y.o. male admitted on 08/17/2016 with pneumonia.  Pharmacy has been consulted for zosyn dosing. A course of vancomycin and zosyn was completed yesterday and now restarting zosyn. Tmax is 102 and WBC is elevated at 20.5. SCr is WNL.   Plan: - Zosyn 3.375gm IV Q8H (4 hr inf) - F/u renal fxn, C&S, clinical status   Height: 5\' 9"  (175.3 cm) Weight: 211 lb 10.3 oz (96 kg) IBW/kg (Calculated) : 70.7  Temp (24hrs), Avg:100.2 F (37.9 C), Min:99.8 F (37.7 C), Max:100.5 F (38.1 C)   Recent Labs Lab 08/21/16 1130 08/22/16 0440 08/23/16 0430 08/24/16 0500 08/25/16 0443 08/25/16 0450 08/26/16 0344  WBC  --  10.5 15.9* 16.7* 21.3*  --  20.5*  CREATININE  --  1.02 1.29* 0.91  --  1.12 0.95  VANCOTROUGH 19  --   --   --   --   --   --     Estimated Creatinine Clearance: 100.4 mL/min (by C-G formula based on SCr of 0.95 mg/dL).    Allergies  Allergen Reactions  . Wellbutrin [Bupropion] Cough    Antimicrobials this admission:  Azith 10/20>>10/21 CTX 10/20>>10/21 Vanc 10/21 >>10/26 Zosyn 10/21 >>10/27;10/28>>  Dose adjustments this admission:  10/23 VT: 19 > no dose adjustment indicated 10/25 worsening renal function, decreased vancomycin to q12h  Microbiology results:  10/20 Respiratory panel PCR: neg 10/20 BCx: ngtd 10/22 Trach Aspirate: ngtd   10/20 MRSA PCR: neg 10/22 Resp panel PCR: neg 10/23 Legionella: IP  Lysle Pearlachel Omer Puccinelli, PharmD, BCPS Pager # [phone] 08/26/2016 1:34 PM

## 2016-08-26 NOTE — Progress Notes (Signed)
PULMONARY / CRITICAL CARE MEDICINE   Name: Bradley [name] MRN: [id number] DOB: Apr 10, 1961    ADMISSION DATE:  08/17/2016 CONSULTATION DATE:  08/19/16  REFERRING MD:  Dr Thayer Ohmhris Rama  CHIEF COMPLAINT:  Acute resp failure with hypoxemia  BRIEF 55 year old smoker, with PFT nov 2016 showing isolated reduction in dlco to 59% and quite functional and not on home o2. Followed by Dr Sherene SiresWert -0 last seen Sept/Nov 2016 based on CT chest evidence showing diffuse changes of centrilobular and paraseptal emphysema along with prominent interstitial markings primarily in the upper lobes consistent with pulmonary fibrosis. This was a non--UIP pattern. Patient recommended quitting smoking because of concerns of DIP. According to the patient and his wife, has continued to smoke. He has been in his baseline status of health and very functional. Then on 08/14/2016 he had a chest x-ray preoperative that only showed chronic changes without change for a right shoulder surgery rotator cuff tear. The procedure was uneventful. Postoperative course was uneventful. There was no vomiting. Then abruptly on 08/16/2016 he started getting acutely ill with shortness of breath and cough with brown colored sputum. Then admitted to the hospital. Initial white count was 13,000. BNP was normal. CT angiogram chest ruled out pulmonary embolism but showed diffuse ground glass opacities and enlarged mediastinal node measuring close to 2 cm. He was started on community acquired pneumonia antibiotics but is developed progressive acute respiratory failure necessitating Solu-Medrol since 08/18/2016 evening. And from the early hours of 08/19/2016 he's been on BiPAP with significant respiratory distress and therefore pulmonary critical care has been consulted  At baseline he denies any mold exposure or mildew exposure or working with dust. He denies any birds in the house. He continues to smoke.   Events: 08/17/2016 - admit 08/18/2016-respiratory  virus panel, HIV and flu panel PCR negative 08/19/16 -bipap, move to icu -> intubated 10/21 10/22 repeat RVP from trach aspirate - negative 10/23 levo off 10/24 nimbex off  SUBJECTIVE/OVERNIGHT/INTERVAL HX 08/20/16 -> Intubated overnight (DIFFICULT). On ARDS protocol with nimbex now. ECHO: LVEF 60-65%. normal wall thickness, normal wall motion, normal LA size, moderate TR, RVSP 56 mmHg (moderate pulmonary hypertension), estimated RA pressure of 8 mmHg. 10/23 --> improved settings on vent. Levo off.  10/24 --> P to F ratio improved to 205 with today's ABG; CVP 5; FiO2 40, PEEP 10, RR 35, TV 440; paralyzed with nimbex 2 mcg 10/25 --> RR adjusted from 35 to 30 overnight due to respiratory acidosis. Did not improve gas, so RR decreased to 30 again. P/F ratios of 275 and 171, respectively.  10/26 --> ABGs improved on pressure control vent settings. FiO2 increased from 40 to 60 overnight due to desaturations. Decreased to 50 this a.m. Febrile to 100.4 F yesterday.   VITAL SIGNS: BP (!) 121/94   Pulse (!) 118   Temp 100.1 F (37.8 C) (Oral)   Resp (!) 36   Ht 5\' 9"  (1.753 m)   Wt 211 lb 10.3 oz (96 kg)   SpO2 96%   BMI 31.25 kg/m   HEMODYNAMICS: CVP:  [7 mmHg-8 mmHg] 7 mmHg  VENTILATOR SETTINGS: Vent Mode: PCV FiO2 (%):  [40 %-50 %] 40 % Set Rate:  [35 bmp] 35 bmp PEEP:  [10 cmH20] 10 cmH20 Plateau Pressure:  [25 cmH20-30 cmH20] 25 cmH20  INTAKE / OUTPUT: I/O last 3 completed shifts: In: 5051.7 [I.V.:1001.7; NG/GT:3700; IV Piggyback:350] Out: 5200 [Urine:5100; Emesis/NG output:100]   Abx: Zosyn 10/21>>> Vanc 10/21>>>10/26  Lines: Foley 10/21 >>  ETT and OG 10/21 >> CVC 10/22 >> PIV x 3 A-line left radial 10/22 >>  PHYSICAL EXAMINATION: General:  Middle-aged, sedated, ETT in place Neuro: Sedated, arousable HEENT:  ETT in place Cardiovascular:  Regular rate and rhythm Lungs: Decreased BS at bases Abdomen:  Soft nontender Musculoskeletal:  No cyanosis no clubbing no  edema. Looks euvolemic; clean dry bandage over R shoulder Skin:  Intact on exposed areas  LABS:  PULMONARY  Recent Labs Lab 08/20/16 1143  08/23/16 1302 08/23/16 1700 08/24/16 0405 08/24/16 1405 08/25/16 0249 08/26/16 0320  PHART 7.275*  < > 7.399 7.395 7.367 7.384 7.388 7.454*  PCO2ART 68.4*  < > 70.1* 56.4* 60.9* 61.8* 61.7* 48.1*  PO2ART 78.0*  < > 260.0* 104.0 101 97.0 91.0 123*  HCO3 31.9*  < > 42.9* 34.2* 34.1* 36.7* 36.7* 33.2*  TCO2 34  --  45 36  --  38 39  --   O2SAT 93.0  < > 100.0 98.0 97.1 97.0 96.0 98.4  < > = values in this interval not displayed. CBC  Recent Labs Lab 08/24/16 0500 08/25/16 0443 08/26/16 0344  HGB 12.7* 14.0 13.8  HCT 41.3 44.7 43.2  WBC 16.7* 21.3* 20.5*  PLT 189 195 154   COAGULATION No results for input(s): INR in the last 168 hours. CARDIAC    Recent Labs Lab 08/19/16 1057 08/19/16 1900 08/20/16 0250  TROPONINI <0.03 <0.03 0.07*   No results for input(s): PROBNP in the last 168 hours.  CHEMISTRY  Recent Labs Lab 08/22/16 0440 08/23/16 0430 08/24/16 0500 08/25/16 0443 08/25/16 0450 08/26/16 0344  NA 147* 150* 147*  --  143 143  K 4.6 4.8 4.5  --  4.2 3.5  CL 103 102 106  --  101 103  CO2 37* 40* 34*  --  34* 32  GLUCOSE 162* 207* 210*  --  205* 188*  BUN 41* 54* 39*  --  54* 47*  CREATININE 1.02 1.29* 0.91  --  1.12 0.95  CALCIUM 8.5* 8.4* 8.5*  --  8.8* 8.6*  MG 3.1* 3.2* 2.8* 2.7*  --  2.4  PHOS 3.7 5.4* 3.2  --  5.7* 4.2   Estimated Creatinine Clearance: 100.4 mL/min (by C-G formula based on SCr of 0.95 mg/dL).  LIVER  Recent Labs Lab 08/25/16 0450  ALBUMIN 2.8*   INFECTIOUS  Recent Labs Lab 08/19/16 1057  LATICACIDVEN 1.8   ENDOCRINE CBG (last 3)   Recent Labs  08/25/16 1937 08/25/16 2313 08/26/16 0339  GLUCAP 190* 161* 179*   IMAGING x48h  - image(s) personally visualized  -   highlighted in bold Dg Chest Port 1 View  Result Date: 08/25/2016 CLINICAL DATA:  Intubation. EXAM:  PORTABLE CHEST 1 VIEW COMPARISON:  08/24/2016 FINDINGS: Endotracheal tube has tip 8.1 cm above the carina. Nasogastric tube courses through the stomach and in over the expected region of the proximal duodenum inferior portion of the film as tip is not visualized. Right IJ central venous catheter unchanged. Lungs are hypoinflated demonstrate mild bibasilar opacification with improved aeration in the left base as findings likely due to atelectasis and possible small amount of pleural fluid. Cardiomediastinal silhouette and remainder of the exam is unchanged. IMPRESSION: Persistent bibasilar opacification with interval improved aeration in the left base likely atelectasis and possible small amount of pleural fluid. Tubes and lines as described. Electronically Signed   By: Elberta Fortisaniel  Boyle M.D.   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 No known ILD exposures per hx  #Admit 08/17/2016 - Abrupt respiratory illness with acute hypoxemic respiratory failure with significant deterioration 3 days after right shoulder surgery. Pulmonary embolism ruled out. Diffuse ground glass opacities on CT chest at admission  - Differential diagnosis includes: Undiagnosed IPF flare, acute lung injury postop idiopathic, Boop, acute eosinophilic pneumonia and viral induced ARDS (less likely as repeat negative)  #Current 08/26/16  - improving ARDS, off nimbex 10/24. On 60% fio2, peep 8.  - Trach cx with normal respiratory flora - BAL with lavage 10/24 unremarkable - Autoimmune and vasculitis labs - negative except for RF mildly elevated at 17.6 -CXR improved aeration . Remains in neg I/o bal , Pco2 trend down   P:   ARDS NET protocol since 08/20/16. PCV given asynchrony and concern for auto-PEEP, cont PS to 20 and PEEP  to 10. Finish 8 days of abx and monitor off. Continue steroids until extubated but decreased to 60 mg IV q12. Ipratroprium and xopenex q6h. Lasix 40mg  x 1 .   CARDIOVASCULAR A:  MI ruled out but troponin increased to 0.07. Suspect demand. ECHO ok 10/22 - started on levophed, discontinued 10/23 Net negative 1.2 P:  Monitor Obtain q6h CVPs  RENAL A:   At risk for lactic acidosis and renal injury S/p diamox x 3 10/24 Free water deficit 2.9 L AKI resolved P:   Decrease Free water to 250 q 4  Lasix 40 mg IV x1 dose Replace electrolytes as indicated. KVO IVF. BMET in AM.  GASTROINTESTINAL A:   At risk for aspiration with intubation P:   NPO except meds TFs per nutrition  HEMATOLOGIC A:   At risk for anemia of critical illness P:  Monitor - stable. DVT prophylaxis - lovenox.  INFECTIOUS A:   Unclear if this is community acquired pneumonia P:    procalcitonin --> 0.34 Finish 8d of broad antibiotic therapy >> vanc and zosyn   ENDOCRINE A:   At risk for hyperglycemia  - CBGs low 200s P:   ICU hyperglycemia protocol. Solumedrol 60 q12h (day 9). TF coverage.  Lantus to 15 units daily.  NEUROLOGIC A:   At risk for delirium Unresponsive on minimal sedation 10/25 -- suspect 2/2 hypercapnia. Head CT 10/25 without any acute changes P:   Monitor RASS goal: -2 Fentanyl drip Diprivan  Versed As needed    FAMILY  - Updates: Wife updated at length bedside.  Tammy Parrett NP-C  [facility]  [phone]    Attending Note  55 year old male with IPF presenting in fluid overload and PNA.  Patient is slowly improving.  On exam, lungs with coarse BS but is improving.  I reviewed CXR myself, ETT ok.  ABG with relative respiratory alkalosis.  Decrease RR to 28 and PEEP to 5.  Continue diureses and keep negative.  Replace K.  Hold off weaning for today, will likely start active weaning in AM.  Minimize sedation as able.  Wife updated at length  bedside.  The patient is critically ill with multiple organ systems failure and requires high complexity decision making for assessment and support, frequent evaluation and titration of therapies, application of advanced monitoring technologies and extensive interpretation of multiple databases.   Critical Care Time devoted to patient care services described in this note is  35  Minutes. This time  reflects time of care of this signee Dr Koren Bound. This critical care time does not reflect procedure time, or teaching time or supervisory time of PA/NP/Med student/Med Resident etc but could involve care discussion time.  Alyson Reedy, M.D. [facility] Pulmonary/Critical Care Medicine. Pager: [phone]. After hours pager: [phone].  08/26/2016

## 2016-08-26 NOTE — Progress Notes (Addendum)
Vent changes by MD.

## 2016-08-27 ENCOUNTER — Inpatient Hospital Stay (HOSPITAL_COMMUNITY): Payer: BLUE CROSS/BLUE SHIELD

## 2016-08-27 LAB — BLOOD GAS, ARTERIAL
ACID-BASE EXCESS: 9.9 mmol/L — AB (ref 0.0–2.0)
Bicarbonate: 34.2 mmol/L — ABNORMAL HIGH (ref 20.0–28.0)
DRAWN BY: 398661
FIO2: 0.4
LHR: 28 {breaths}/min
O2 SAT: 95.5 %
PATIENT TEMPERATURE: 98.6
PCO2 ART: 48.3 mmHg — AB (ref 32.0–48.0)
PEEP/CPAP: 5 cmH2O
PH ART: 7.464 — AB (ref 7.350–7.450)
PO2 ART: 81.2 mmHg — AB (ref 83.0–108.0)
Pressure control: 20 cmH2O

## 2016-08-27 LAB — CBC
HCT: 45.8 % (ref 39.0–52.0)
Hemoglobin: 14.7 g/dL (ref 13.0–17.0)
MCH: 30.4 pg (ref 26.0–34.0)
MCHC: 32.1 g/dL (ref 30.0–36.0)
MCV: 94.6 fL (ref 78.0–100.0)
Platelets: 179 10*3/uL (ref 150–400)
RBC: 4.84 MIL/uL (ref 4.22–5.81)
RDW: 14.7 % (ref 11.5–15.5)
WBC: 24.2 10*3/uL — ABNORMAL HIGH (ref 4.0–10.5)

## 2016-08-27 LAB — BASIC METABOLIC PANEL
ANION GAP: 7 (ref 5–15)
BUN: 42 mg/dL — AB (ref 6–20)
CALCIUM: 8.8 mg/dL — AB (ref 8.9–10.3)
CO2: 34 mmol/L — ABNORMAL HIGH (ref 22–32)
CREATININE: 0.87 mg/dL (ref 0.61–1.24)
Chloride: 108 mmol/L (ref 101–111)
GFR calc non Af Amer: >60 mL/min (ref >60)
Glucose, Bld: 168 mg/dL — ABNORMAL HIGH (ref 65–99)
POTASSIUM: 4.7 mmol/L (ref 3.5–5.1)
SODIUM: 149 mmol/L — AB (ref 135–145)

## 2016-08-27 LAB — GLUCOSE, CAPILLARY
GLUCOSE-CAPILLARY: 175 mg/dL — AB (ref 65–99)
GLUCOSE-CAPILLARY: 201 mg/dL — AB (ref 65–99)
GLUCOSE-CAPILLARY: 285 mg/dL — AB (ref 65–99)
Glucose-Capillary: 184 mg/dL — ABNORMAL HIGH (ref 65–99)
Glucose-Capillary: 316 mg/dL — ABNORMAL HIGH (ref 65–99)
Glucose-Capillary: 225 mg/dL — ABNORMAL HIGH (ref 65–99)

## 2016-08-27 LAB — PHOSPHORUS: PHOSPHORUS: 3.7 mg/dL (ref 2.5–4.6)

## 2016-08-27 LAB — TRIGLYCERIDES: Triglycerides: 166 mg/dL — ABNORMAL HIGH (ref <150)

## 2016-08-27 LAB — MAGNESIUM: Magnesium: 2.7 mg/dL — ABNORMAL HIGH (ref 1.7–2.4)

## 2016-08-27 MED ORDER — LABETALOL HCL 100 MG PO TABS
100.0000 mg | ORAL_TABLET | Freq: Three times a day (TID) | ORAL | Status: DC
  Administered 2016-08-27 – 2016-09-08 (×29): 100 mg via ORAL
  Filled 2016-08-27 (×41): qty 1

## 2016-08-27 MED ORDER — FLEET ENEMA 7-19 GM/118ML RE ENEM
1.0000 | ENEMA | Freq: Once | RECTAL | Status: DC
  Filled 2016-08-27: qty 1

## 2016-08-27 MED ORDER — DOCUSATE SODIUM 50 MG/5ML PO LIQD
100.0000 mg | Freq: Two times a day (BID) | ORAL | Status: AC
  Filled 2016-08-27 (×2): qty 10

## 2016-08-27 MED ORDER — MAGNESIUM HYDROXIDE 400 MG/5ML PO SUSP
30.0000 mL | Freq: Once | ORAL | Status: DC
  Filled 2016-08-27: qty 30

## 2016-08-27 NOTE — Progress Notes (Signed)
PULMONARY / CRITICAL CARE MEDICINE   Name: Bradley [name] MRN: [id number] DOB: [birth year]    ADMISSION DATE:  08/17/2016 CONSULTATION DATE:  08/19/16  REFERRING MD:  Dr Thayer Ohm Rama  CHIEF COMPLAINT:  Acute resp failure with hypoxemia  BRIEF 55 year old smoker, with PFT nov 2016 showing isolated reduction in dlco to 59% and quite functional and not on home o2. Followed by Dr Sherene Sires -0 last seen Sept/Nov 2016 based on CT chest evidence showing diffuse changes of centrilobular and paraseptal emphysema along with prominent interstitial markings primarily in the upper lobes consistent with pulmonary fibrosis. This was a non--UIP pattern. Patient recommended quitting smoking because of concerns of DIP. According to the patient and his wife, has continued to smoke. He has been in his baseline status of health and very functional. Then on 08/14/2016 he had a chest x-ray preoperative that only showed chronic changes without change for a right shoulder surgery rotator cuff tear. The procedure was uneventful. Postoperative course was uneventful. There was no vomiting. Then abruptly on 08/16/2016 he started getting acutely ill with shortness of breath and cough with brown colored sputum. Then admitted to the hospital. Initial white count was 13,000. BNP was normal. CT angiogram chest ruled out pulmonary embolism but showed diffuse ground glass opacities and enlarged mediastinal node measuring close to 2 cm. He was started on community acquired pneumonia antibiotics but is developed progressive acute respiratory failure necessitating Solu-Medrol since 08/18/2016 evening. And from the early hours of 08/19/2016 he's been on BiPAP with significant respiratory distress and therefore pulmonary critical care has been consulted  At baseline he denies any mold exposure or mildew exposure or working with dust. He denies any birds in the house. He continues to smoke.   Events: 08/17/2016 - admit 08/18/2016-respiratory  virus panel, HIV and flu panel PCR negative 08/19/16 -bipap, move to icu -> intubated 10/21 10/22 repeat RVP from trach aspirate - negative 10/23 levo off 10/24 nimbex off  SUBJECTIVE/OVERNIGHT/INTERVAL HX 08/20/16 -> Intubated overnight (DIFFICULT). On ARDS protocol with nimbex now. ECHO: LVEF 60-65%. normal wall thickness, normal wall motion, normal LA size, moderate TR, RVSP 56 mmHg (moderate pulmonary hypertension), estimated RA pressure of 8 mmHg. 10/23 --> improved settings on vent. Levo off.  10/24 --> P to F ratio improved to 205 with today's ABG; CVP 5; FiO2 40, PEEP 10, RR 35, TV 440; paralyzed with nimbex 2 mcg 10/25 --> RR adjusted from 35 to 30 overnight due to respiratory acidosis. Did not improve gas, so RR decreased to 30 again. P/F ratios of 275 and 171, respectively.  10/26 --> ABGs improved on pressure control vent settings. FiO2 increased from 40 to 60 overnight due to desaturations. Decreased to 50 this a.m. Febrile to 100.4 F yesterday.  10/29 - beginning to wean  VITAL SIGNS: BP 114/89   Pulse (!) 110   Temp 100.3 F (37.9 C) (Oral)   Resp (!) 30   Ht 5\' 9"  (1.753 m)   Wt 96.2 kg (212 lb)   SpO2 93%   BMI 31.31 kg/m   HEMODYNAMICS: CVP:  [5 mmHg-8 mmHg] 5 mmHg  VENTILATOR SETTINGS: Vent Mode: CPAP;PSV FiO2 (%):  [40 %] 40 % Set Rate:  [28 bmp-35 bmp] 28 bmp PEEP:  [5 cmH20-10 cmH20] 5 cmH20 Pressure Support:  [5 cmH20] 5 cmH20 Plateau Pressure:  [17 cmH20-25 cmH20] 17 cmH20  INTAKE / OUTPUT: I/O last 3 completed shifts: In: 5181.1 [I.V.:856.1; NG/GT:3975; IV Piggyback:350] Out: 5535 [Urine:5535]   Abx: Zosyn  10/21>>> Vanc 10/21>>>10/26  Lines: Foley 10/21 >> ETT and OG 10/21 >> CVC 10/22 >> PIV x 3 A-line left radial 10/22 >>  PHYSICAL EXAMINATION: General:  Middle-aged, sedated, ETT in place Neuro: Sedated, arousable HEENT:  ETT in place Cardiovascular:  Regular rate and rhythm Lungs: Decreased BS at bases Abdomen:  Soft  nontender Musculoskeletal:  No cyanosis no clubbing no edema. Looks euvolemic; clean dry bandage over R shoulder Skin:  Intact on exposed areas  LABS:  PULMONARY  Recent Labs Lab 08/20/16 1143  08/23/16 1302 08/23/16 1700  08/24/16 1405 08/25/16 0249 08/26/16 0320 08/26/16 1520 08/27/16 0330  PHART 7.275*  < > 7.399 7.395  < > 7.384 7.388 7.454* 7.49* 7.464*  PCO2ART 68.4*  < > 70.1* 56.4*  < > 61.8* 61.7* 48.1* 47.1 48.3*  PO2ART 78.0*  < > 260.0* 104.0  < > 97.0 91.0 123* 89.1 81.2*  HCO3 31.9*  < > 42.9* 34.2*  < > 36.7* 36.7* 33.2* 35.5* 34.2*  TCO2 34  --  45 36  --  38 39  --   --   --   O2SAT 93.0  < > 100.0 98.0  < > 97.0 96.0 98.4 96.5 95.5  < > = values in this interval not displayed. CBC  Recent Labs Lab 08/25/16 0443 08/26/16 0344 08/27/16 0500  HGB 14.0 13.8 14.7  HCT 44.7 43.2 45.8  WBC 21.3* 20.5* 24.2*  PLT 195 154 179   COAGULATION No results for input(s): INR in the last 168 hours. CARDIAC   No results for input(s): TROPONINI in the last 168 hours. No results for input(s): PROBNP in the last 168 hours.  CHEMISTRY  Recent Labs Lab 08/23/16 0430 08/24/16 0500 08/25/16 0443 08/25/16 0450 08/26/16 0344 08/27/16 0500  NA 150* 147*  --  143 143 149*  K 4.8 4.5  --  4.2 3.5 4.7  CL 102 106  --  101 103 108  CO2 40* 34*  --  34* 32 34*  GLUCOSE 207* 210*  --  205* 188* 168*  BUN 54* 39*  --  54* 47* 42*  CREATININE 1.29* 0.91  --  1.12 0.95 0.87  CALCIUM 8.4* 8.5*  --  8.8* 8.6* 8.8*  MG 3.2* 2.8* 2.7*  --  2.4 2.7*  PHOS 5.4* 3.2  --  5.7* 4.2 3.7   Estimated Creatinine Clearance: 109.8 mL/min (by C-G formula based on SCr of 0.87 mg/dL).  LIVER  Recent Labs Lab 08/25/16 0450  ALBUMIN 2.8*   INFECTIOUS No results for input(s): LATICACIDVEN, PROCALCITON in the last 168 hours. ENDOCRINE CBG (last 3)   Recent Labs  08/26/16 2345 08/27/16 0453 08/27/16 0741  GLUCAP 321* 175* 184*   IMAGING x48h  - image(s) personally  visualized  -   highlighted in bold Dg Chest Port 1 View  Result Date: 08/26/2016 CLINICAL DATA:  ET tube placement. EXAM: PORTABLE CHEST 1 VIEW COMPARISON:  08/25/2016 FINDINGS: Endotracheal tube with the tip 5.3 cm above the carina. Nasogastric tube coursing below the diaphragm. Right IJ central venous catheter with the tip projecting over the SVC. Mild bilateral interstitial prominence which has significantly improved compared with the prior exam. No significant pleural effusion. No pneumothorax. Stable cardiomediastinal silhouette. No acute osseous abnormality. IMPRESSION: 1. Stable support lines and tubing. 2. Improved aeration compared with the prior exam. Electronically Signed   By: Elige KoHetal  Patel   On: 08/26/2016 08:46   ASSESSMENT / PLAN:  PULMONARY A: #Baseline - Chronic smoker -  September 2016 with isolated reduction in diffusion capacity associated with emphysema and non-UIP pattern of interstitial lung disease - not otherwise specified. No known ILD exposures per hx  #Admit 08/17/2016 - Abrupt respiratory illness with acute hypoxemic respiratory failure with significant deterioration 3 days after right shoulder surgery. Pulmonary embolism ruled out. Diffuse ground glass opacities on CT chest at admission  - Differential diagnosis includes: Undiagnosed IPF flare, acute lung injury postop idiopathic, Boop, acute eosinophilic pneumonia and viral induced ARDS (less likely as repeat negative)  #Current 08/26/16  - improving ARDS, off nimbex 10/24. On 60% fio2, peep 8.  - Trach cx with normal respiratory flora - BAL with lavage 10/24 unremarkable - Autoimmune and vasculitis labs - negative except for RF mildly elevated at 17.6 -CXR improved aeration . Remains in neg I/o bal , Pco2 trend down   P:   ARDS NET protocol since 08/20/16. PCV given asynchrony and concern for auto-PEEP, cont PS to 20 and PEEP to 10. Continue zosyn Continue steroids until extubated but decreased to 60 mg IV  q12. Ipratroprium and xopenex q6h.  CARDIOVASCULAR A:  MI ruled out but troponin increased to 0.07. Suspect demand. ECHO ok 10/22 - started on levophed, discontinued 10/23 Net negative 1.2 P:  Monitor Obtain q6h CVPs  RENAL A:   At risk for lactic acidosis and renal injury S/p diamox x 3 10/24 Free water deficit 2.9 L AKI resolved P:   Decreased Free water to 250 q 4  Hold further diureses Replace electrolytes as indicated. KVO IVF. BMET in AM.  GASTROINTESTINAL A:   At risk for aspiration with intubation P:   NPO except meds TFs per nutrition  HEMATOLOGIC A:   At risk for anemia of critical illness P:  Monitor - stable. DVT prophylaxis - lovenox.  INFECTIOUS A:   Unclear if this is community acquired pneumonia P:   Procalcitonin --> 0.34 Vanc off, zosyn was stopped for one day then spiked fever, will continue for now since patient is progressing nicely.  ENDOCRINE A:   At risk for hyperglycemia  - CBGs low 200s P:   ICU hyperglycemia protocol. Solumedrol 60 q12h (day 9). TF coverage.  Lantus to 15 units daily.  NEUROLOGIC A:   At risk for delirium Unresponsive on minimal sedation 10/25 -- suspect 2/2 hypercapnia. Head CT 10/25 without any acute changes P:   Monitor RASS goal: -2 Fentanyl drip off for SBT Diprivan off for SBT Versed As needed    FAMILY  - Updates: No family bedside this AM  The patient is critically ill with multiple organ systems failure and requires high complexity decision making for assessment and support, frequent evaluation and titration of therapies, application of advanced monitoring technologies and extensive interpretation of multiple databases.   Critical Care Time devoted to patient care services described in this note is  35  Minutes. This time reflects time of care of this signee Dr Koren BoundWesam Azarian Starace. This critical care time does not reflect procedure time, or teaching time or supervisory time of PA/NP/Med student/Med  Resident etc but could involve care discussion time.  Alyson ReedyWesam G. Makeba Delcastillo, M.D. [facility]. Pager: [phone]. After hours pager: [phone].  08/27/2016

## 2016-08-27 NOTE — Progress Notes (Signed)
RN called RT to room. Pt placed back on full vent support due to increased WOB, accessory muscle use.

## 2016-08-28 ENCOUNTER — Inpatient Hospital Stay (HOSPITAL_COMMUNITY): Payer: BLUE CROSS/BLUE SHIELD

## 2016-08-28 DIAGNOSIS — J181 Lobar pneumonia, unspecified organism: Secondary | ICD-10-CM

## 2016-08-28 LAB — GLUCOSE, CAPILLARY
GLUCOSE-CAPILLARY: 247 mg/dL — AB (ref 65–99)
GLUCOSE-CAPILLARY: 252 mg/dL — AB (ref 65–99)
Glucose-Capillary: 232 mg/dL — ABNORMAL HIGH (ref 65–99)
Glucose-Capillary: 236 mg/dL — ABNORMAL HIGH (ref 65–99)
Glucose-Capillary: 282 mg/dL — ABNORMAL HIGH (ref 65–99)
Glucose-Capillary: 282 mg/dL — ABNORMAL HIGH (ref 65–99)

## 2016-08-28 LAB — CBC
HCT: 45.2 % (ref 39.0–52.0)
Hemoglobin: 14.3 g/dL (ref 13.0–17.0)
MCH: 30.5 pg (ref 26.0–34.0)
MCHC: 31.6 g/dL (ref 30.0–36.0)
MCV: 96.4 fL (ref 78.0–100.0)
Platelets: 156 10*3/uL (ref 150–400)
RBC: 4.69 MIL/uL (ref 4.22–5.81)
RDW: 15.2 % (ref 11.5–15.5)
WBC: 24.7 10*3/uL — ABNORMAL HIGH (ref 4.0–10.5)

## 2016-08-28 LAB — PHOSPHORUS: Phosphorus: 3.4 mg/dL (ref 2.5–4.6)

## 2016-08-28 LAB — BASIC METABOLIC PANEL
ANION GAP: 7 (ref 5–15)
Anion gap: 7 (ref 5–15)
BUN: 41 mg/dL — AB (ref 6–20)
BUN: 45 mg/dL — ABNORMAL HIGH (ref 6–20)
CHLORIDE: 108 mmol/L (ref 101–111)
CO2: 32 mmol/L (ref 22–32)
CO2: 31 mmol/L (ref 22–32)
CREATININE: 0.88 mg/dL (ref 0.61–1.24)
Calcium: 8.2 mg/dL — ABNORMAL LOW (ref 8.9–10.3)
Calcium: 8.3 mg/dL — ABNORMAL LOW (ref 8.9–10.3)
Chloride: 109 mmol/L (ref 101–111)
Creatinine, Ser: 0.92 mg/dL (ref 0.61–1.24)
GFR calc Af Amer: >60 mL/min (ref >60)
GFR calc non Af Amer: >60 mL/min (ref >60)
GLUCOSE: 257 mg/dL — AB (ref 65–99)
Glucose, Bld: 253 mg/dL — ABNORMAL HIGH (ref 65–99)
POTASSIUM: 4 mmol/L (ref 3.5–5.1)
Potassium: 4 mmol/L (ref 3.5–5.1)
SODIUM: 148 mmol/L — AB (ref 135–145)
SODIUM: 146 mmol/L — AB (ref 135–145)

## 2016-08-28 LAB — BLOOD GAS, ARTERIAL
ACID-BASE EXCESS: 8.9 mmol/L — AB (ref 0.0–2.0)
Acid-Base Excess: 9 mmol/L — ABNORMAL HIGH (ref 0.0–2.0)
BICARBONATE: 33.4 mmol/L — AB (ref 20.0–28.0)
Bicarbonate: 32.9 mmol/L — ABNORMAL HIGH (ref 20.0–28.0)
DRAWN BY: 398661
DRAWN BY: 280981
FIO2: 0.4
FIO2: 50
O2 Saturation: 95.6 %
O2 Saturation: 96.6 %
PATIENT TEMPERATURE: 98.6
PEEP: 5 cmH2O
PEEP: 5 cmH2O
PH ART: 7.478 — AB (ref 7.350–7.450)
PO2 ART: 79.3 mmHg — AB (ref 83.0–108.0)
PRESSURE CONTROL: 20 cmH2O
Patient temperature: 98.6
Pressure control: 20 cmH2O
RATE: 28 resp/min
RATE: 20 resp/min
pCO2 arterial: 44.9 mmHg (ref 32.0–48.0)
pCO2 arterial: 49.2 mmHg — ABNORMAL HIGH (ref 32.0–48.0)
pH, Arterial: 7.446 (ref 7.350–7.450)
pO2, Arterial: 89 mmHg (ref 83.0–108.0)

## 2016-08-28 LAB — MAGNESIUM: Magnesium: 2.6 mg/dL — ABNORMAL HIGH (ref 1.7–2.4)

## 2016-08-28 MED ORDER — INSULIN GLARGINE 100 UNIT/ML ~~LOC~~ SOLN
25.0000 [IU] | Freq: Every day | SUBCUTANEOUS | Status: DC
Start: 2016-08-29 — End: 2016-08-30
  Administered 2016-08-29 – 2016-08-30 (×2): 25 [IU] via SUBCUTANEOUS
  Filled 2016-08-28 (×2): qty 0.25

## 2016-08-28 MED ORDER — DEXTROSE 5 % IV SOLN
INTRAVENOUS | Status: AC
  Administered 2016-08-28: 09:00:00 via INTRAVENOUS

## 2016-08-28 MED ORDER — INSULIN ASPART 100 UNIT/ML ~~LOC~~ SOLN
0.0000 [IU] | SUBCUTANEOUS | Status: DC
  Administered 2016-08-28: 8 [IU] via SUBCUTANEOUS
  Administered 2016-08-28: 5 [IU] via SUBCUTANEOUS
  Administered 2016-08-28: 8 [IU] via SUBCUTANEOUS
  Administered 2016-08-28: 5 [IU] via SUBCUTANEOUS

## 2016-08-28 MED ORDER — SODIUM CHLORIDE 0.9 % IV SOLN
INTRAVENOUS | Status: DC
  Filled 2016-08-28: qty 2.5

## 2016-08-28 MED ORDER — INSULIN GLARGINE 100 UNIT/ML ~~LOC~~ SOLN
15.0000 [IU] | Freq: Every day | SUBCUTANEOUS | Status: DC
  Administered 2016-08-28: 15 [IU] via SUBCUTANEOUS
  Filled 2016-08-28: qty 0.15

## 2016-08-28 MED ORDER — INSULIN ASPART 100 UNIT/ML ~~LOC~~ SOLN
5.0000 [IU] | SUBCUTANEOUS | Status: DC
  Administered 2016-08-28 – 2016-08-31 (×18): 5 [IU] via SUBCUTANEOUS

## 2016-08-28 MED ORDER — INSULIN ASPART 100 UNIT/ML ~~LOC~~ SOLN
3.0000 [IU] | SUBCUTANEOUS | Status: DC
  Administered 2016-08-28: 3 [IU] via SUBCUTANEOUS

## 2016-08-28 MED ORDER — FUROSEMIDE 10 MG/ML IJ SOLN
40.0000 mg | Freq: Every day | INTRAMUSCULAR | Status: DC
  Administered 2016-08-28: 40 mg via INTRAVENOUS
  Filled 2016-08-28 (×2): qty 4

## 2016-08-28 MED ORDER — METHYLPREDNISOLONE SODIUM SUCC 40 MG IJ SOLR
40.0000 mg | Freq: Two times a day (BID) | INTRAMUSCULAR | Status: DC
  Administered 2016-08-28 – 2016-08-30 (×4): 40 mg via INTRAVENOUS
  Filled 2016-08-28 (×4): qty 1

## 2016-08-28 NOTE — Progress Notes (Signed)
Nutrition Follow-up  DOCUMENTATION CODES:   Not applicable  INTERVENTION:    Continue Vital AF 1.2 via OGT at 75 ml/h (1800 ml per day) to provide 2160 kcals, 135 gm protein, 1460 ml free water daily.   Free water flushes 250 ml every 4 hours per MD.  Total intake 2340 kcal, 135 gm protein, 2960 ml free water per day.  NUTRITION DIAGNOSIS:   Inadequate oral intake related to inability to eat as evidenced by NPO status.  Ongoing  GOAL:   Patient will meet greater than or equal to 90% of their needs  Met  MONITOR:   Vent status, Labs, Weight trends, TF tolerance  ASSESSMENT:   55 y.o. male with medical history significant of COPD, asthma, pulmonary fibrosis, depression, anxiety, kidney stone, tobacco abuse, who presents with cough and shortness of breath: dx with PNA.  Patient remains on ventilator support MV: 15.3 L/min Temp (24hrs), Avg:100.1 F (37.8 C), Min:98.9 F (37.2 C), Max:100.9 F (38.3 C)  Propofol: 8.7 ml/hr providing 230 kcal per day. Patient is currently receiving Vital AF 1.2 via OGT at 75 ml/h (1800 ml per day) to provide 2160 kcals, 135 gm protein, 1460 ml free water daily.  Free water flushes 250 ml every 4 hours. Total intake 2340 kcal, 135 gm protein, 2960 ml free water per day. Labs reviewed: sodium elevated at 148. Magnesium elevated at 2.6. Medications reviewed and include Lasix, Insulin, Solumedrol.  Diet Order:  Diet NPO time specified Except for: Sips with Meds  Skin:  Reviewed, no issues  Last BM:  10/21  Height:   Ht Readings from Last 1 Encounters:  08/18/16 '5\' 9"'$  (1.753 m)    Weight:   Wt Readings from Last 1 Encounters:  08/27/16 212 lb (96.2 kg)   08/18/16 198 lb 3.1 oz (89.9 kg)   Weight up, likely related to fluids.  Ideal Body Weight:  72.7 kg  BMI:  Body mass index is 31.31 kg/m.  BMI 29.7 on admission.  Estimated Nutritional Needs:   Kcal:  2317  Protein:  125-140 gm  Fluid:  2.3 L  EDUCATION  NEEDS:   No education needs identified at this time  Molli Barrows, [place], [place], [place] Pager [phone] After Hours Pager [phone]

## 2016-08-28 NOTE — Progress Notes (Signed)
PULMONARY / CRITICAL CARE MEDICINE   Name: Bradley [name] MRN: [id number] DOB: 05-03-1961    ADMISSION DATE:  08/17/2016 CONSULTATION DATE:  08/19/16  REFERRING MD:  Dr Thayer Ohm Rama  CHIEF COMPLAINT:  Acute resp failure with hypoxemia  BRIEF 55 year old smoker, with PFT nov 2016 showing isolated reduction in dlco to 59% and quite functional and not on home o2. Followed by Dr Sherene Sires -0 last seen Sept/Nov 2016 based on CT chest evidence showing diffuse changes of centrilobular and paraseptal emphysema along with prominent interstitial markings primarily in the upper lobes consistent with pulmonary fibrosis. This was a non--UIP pattern. Patient recommended quitting smoking because of concerns of DIP. According to the patient and his wife, has continued to smoke. He has been in his baseline status of health and very functional. Then on 08/14/2016 he had a chest x-ray preoperative that only showed chronic changes without change for a right shoulder surgery rotator cuff tear. The procedure was uneventful. Postoperative course was uneventful. There was no vomiting. Then abruptly on 08/16/2016 he started getting acutely ill with shortness of breath and cough with brown colored sputum. Then admitted to the hospital. Initial white count was 13,000. BNP was normal. CT angiogram chest ruled out pulmonary embolism but showed diffuse ground glass opacities and enlarged mediastinal node measuring close to 2 cm. He was started on community acquired pneumonia antibiotics but is developed progressive acute respiratory failure necessitating Solu-Medrol since 08/18/2016 evening. And from the early hours of 08/19/2016 he's been on BiPAP with significant respiratory distress and therefore pulmonary critical care has been consulted  At baseline he denies any mold exposure or mildew exposure or working with dust. He denies any birds in the house. He continues to smoke.   Events: 08/17/2016 - admit 08/18/2016-respiratory  virus panel, HIV and flu panel PCR negative 08/19/16 -bipap, move to icu -> intubated 10/21 10/22 repeat RVP from trach aspirate - negative 10/23 levo off 10/24 nimbex off  SUBJECTIVE/OVERNIGHT/INTERVAL HX 08/20/16 -> Intubated overnight (DIFFICULT). On ARDS protocol with nimbex now. ECHO: LVEF 60-65%. normal wall thickness, normal wall motion, normal LA size, moderate TR, RVSP 56 mmHg (moderate pulmonary hypertension), estimated RA pressure of 8 mmHg. 10/23 --> improved settings on vent. Levo off.  10/24 --> P to F ratio improved to 205 with today's ABG; CVP 5; FiO2 40, PEEP 10, RR 35, TV 440; paralyzed with nimbex 2 mcg 10/25 --> RR adjusted from 35 to 30 overnight due to respiratory acidosis. Did not improve gas, so RR decreased to 30 again. P/F ratios of 275 and 171, respectively.  10/26 --> ABGs improved on pressure control vent settings. FiO2 increased from 40 to 60 overnight due to desaturations.  10/29 - beginning to wean 10/30 - Fever to 100.8 F yesterday  VITAL SIGNS: BP 127/65   Pulse 90   Temp 98.9 F (37.2 C) (Oral)   Resp (!) 28   Ht 5\' 9"  (1.753 m)   Wt 212 lb (96.2 kg)   SpO2 93%   BMI 31.31 kg/m   HEMODYNAMICS: CVP:  [2 mmHg-8 mmHg] 6 mmHg  VENTILATOR SETTINGS: Vent Mode: PCV FiO2 (%):  [40 %] 40 % Set Rate:  [28 bmp] 28 bmp PEEP:  [5 cmH20] 5 cmH20 Pressure Support:  [5 cmH20] 5 cmH20 Plateau Pressure:  [13 cmH20-21 cmH20] 17 cmH20  INTAKE / OUTPUT: I/O last 3 completed shifts: In: 3574.1 [I.V.:652.8; NG/GT:2571.3; IV Piggyback:350] Out: 4175 [Urine:4175]   Abx: Zosyn 10/21>>> Vanc 10/21>>>10/26  Lines: Foley  10/21 >> ETT and OG 10/21 >> CVC 10/22 >> PIV x 3 A-line left radial 10/22 >>  PHYSICAL EXAMINATION: General:  Middle-aged, sedated, ETT in place Neuro: Sedated, arousable HEENT:  ETT in place Cardiovascular:  Regular rate and rhythm Lungs: Decreased BS at bases Abdomen:  Soft nontender Musculoskeletal:  No cyanosis no clubbing no  edema. Looks euvolemic; clean dry bandage over R shoulder Skin:  Intact on exposed areas  LABS:  PULMONARY  Recent Labs Lab 08/23/16 1302 08/23/16 1700  08/24/16 1405 08/25/16 0249 08/26/16 0320 08/26/16 1520 08/27/16 0330 08/28/16 0352  PHART 7.399 7.395  < > 7.384 7.388 7.454* 7.49* 7.464* 7.478*  PCO2ART 70.1* 56.4*  < > 61.8* 61.7* 48.1* 47.1 48.3* 44.9  PO2ART 260.0* 104.0  < > 97.0 91.0 123* 89.1 81.2* 79.3*  HCO3 42.9* 34.2*  < > 36.7* 36.7* 33.2* 35.5* 34.2* 32.9*  TCO2 45 36  --  38 39  --   --   --   --   O2SAT 100.0 98.0  < > 97.0 96.0 98.4 96.5 95.5 95.6  < > = values in this interval not displayed. CBC  Recent Labs Lab 08/26/16 0344 08/27/16 0500 08/28/16 0624  HGB 13.8 14.7 14.3  HCT 43.2 45.8 45.2  WBC 20.5* 24.2* 24.7*  PLT 154 179 156   COAGULATION No results for input(s): INR in the last 168 hours. CARDIAC   No results for input(s): TROPONINI in the last 168 hours. No results for input(s): PROBNP in the last 168 hours.  CHEMISTRY  Recent Labs Lab 08/24/16 0500 08/25/16 0443 08/25/16 0450 08/26/16 0344 08/27/16 0500 08/28/16 0624  NA 147*  --  143 143 149* 148*  K 4.5  --  4.2 3.5 4.7 4.0  CL 106  --  101 103 108 109  CO2 34*  --  34* 32 34* 32  GLUCOSE 210*  --  205* 188* 168* 257*  BUN 39*  --  54* 47* 42* 41*  CREATININE 0.91  --  1.12 0.95 0.87 0.88  CALCIUM 8.5*  --  8.8* 8.6* 8.8* 8.2*  MG 2.8* 2.7*  --  2.4 2.7* 2.6*  PHOS 3.2  --  5.7* 4.2 3.7 3.4   Estimated Creatinine Clearance: 108.5 mL/min (by C-G formula based on SCr of 0.88 mg/dL).  LIVER  Recent Labs Lab 08/25/16 0450  ALBUMIN 2.8*   INFECTIOUS No results for input(s): LATICACIDVEN, PROCALCITON in the last 168 hours. ENDOCRINE CBG (last 3)   Recent Labs  08/27/16 2009 08/27/16 2333 08/28/16 0621  GLUCAP 201* 285* 236*   IMAGING x48h  - image(s) personally visualized  -   highlighted in bold Dg Chest Port 1 View  Result Date: 08/27/2016 CLINICAL  DATA:  Intubated EXAM: PORTABLE CHEST 1 VIEW COMPARISON:  Chest radiograph from one day prior. FINDINGS: Endotracheal tube tip is 3.2 cm above the carina. Enteric tube enters stomach with the tip not seen on this image. Right internal jugular central venous catheter terminates in the middle third of the superior vena cava. Stable cardiomediastinal silhouette with top-normal heart size and aortic atherosclerosis. No pneumothorax. No pleural effusion. No pulmonary edema. Worsened low lung volumes. Mild patchy bibasilar lung opacities appear increased bilaterally. IMPRESSION: 1. Well-positioned support structures as described. No pneumothorax . 2. Worsened low lung volumes. Worsened mild patchy bibasilar lung opacities, favor atelectasis, cannot exclude aspiration or pneumonia. 3. Aortic atherosclerosis. Electronically Signed   By: Delbert PhenixJason A Poff M.D.   On: 08/27/2016 09:03   ASSESSMENT /  PLAN:  PULMONARY A: #Baseline - Chronic smoker - September 2016 with isolated reduction in diffusion capacity associated with emphysema and non-UIP pattern of interstitial lung disease - not otherwise specified. No known ILD exposures per hx  #Admit 08/17/2016 - Abrupt respiratory illness with acute hypoxemic respiratory failure with significant deterioration 3 days after right shoulder surgery. Pulmonary embolism ruled out. Diffuse ground glass opacities on CT chest at admission  - Differential diagnosis includes: Undiagnosed IPF flare, acute lung injury postop idiopathic, Boop, acute eosinophilic pneumonia and viral induced ARDS (less likely as repeat negative)  #Current 08/28/16  - improving ARDS, off nimbex 10/24. On 40% fio2, peep 5.  - Trach cx with normal respiratory flora - BAL with lavage 10/24 unremarkable - Autoimmune and vasculitis labs - negative except for RF mildly elevated at 17.6 - CXR improved aeration . Remains in neg I/o bal , Pco2 trend down   P:   ARDS NET protocol since 08/20/16. PCV given  asynchrony and concern for auto-PEEP Continue zosyn Continue steroids until extubated but decreased to 60 mg IV q12, consider redcution Ipratroprium and xopenex q6h. Lower PC, 15, repeat abg SBt planned -FACTT , lasix   CARDIOVASCULAR A:  MI ruled out but troponin increased to 0.07. Suspect demand. ECHO ok 10/22 - started on levophed, discontinued 10/23 Net negative 3.5 P:  Monitor Obtain q6h CVP- goal 4  RENAL A:   At risk for lactic acidosis and renal injury S/p diamox  Free water deficit 3.3 L AKI resolved P:   Decreased Free water to 250 q 4  Hold further diureses (as of 10/29) Replace electrolytes as indicated. KVO IVF. BMET in AM.  GASTROINTESTINAL A:   At risk for aspiration with intubation P:   NPO except meds TFs per nutrition  HEMATOLOGIC A:   At risk for anemia of critical illness P:  Monitor - stable. DVT prophylaxis - lovenox.  INFECTIOUS A:   Unclear if this is community acquired pneumonia P:   Procalcitonin --> 0.34 Vanc off, zosyn dc, s/p 10 days  ENDOCRINE A:   At risk for hyperglycemia  - CBGs 200s-300s with lantus 15, TF coverage and required 66 short-acting P:   Start IV insulin gtt- if needed in PM Solumedrol 60 q12h (day 10), reduce TF coverage.  NEUROLOGIC A:   At risk for delirium Unresponsive on minimal sedation 10/25 -- suspect 2/2 hypercapnia. Head CT 10/25 without any acute changes P:   Monitor RASS goal: -2 Fentanyl drip off for SBT Diprivan off for SBT Versed As needed    FAMILY  - Updates: No family bedside this AM  Dani GobbleHillary Fitzgerald, MD [facility], PGY-2  08/28/2016   STAFF NOTE: Cindi CarbonI, Daniel Feinstein, MD FACP have personally reviewed patient's available data, including medical history, events of note, physical examination and test results as part of my evaluation. I have discussed with resident/NP and other care providers such as pharmacist, RN and RRT. In addition, I personally evaluated  patient and elicited key findings of: with WUA agitation, coarse BS, pcxr resolving overall,  A picture of ILD now ARDS, r/o infectious etiology, abg reviewed, reduce PC repeat abg, sbt planned with WUA, keep prop as main sedation agent, steroids seemed to have helped, keep but reduce, keep neg 500 as goal, lasix, add d5w to correct Na, upright position, chem in am , dc abx s/p 10 days of therapy, increase TF coverage, may need lantus increased, I have updated family in full The patient is critically  ill with multiple organ systems failure and requires high complexity decision making for assessment and support, frequent evaluation and titration of therapies, application of advanced monitoring technologies and extensive interpretation of multiple databases.   Critical Care Time devoted to patient care services described in this note is 35 Minutes. This time reflects time of care of this signee: Rory Percy, MD FACP. This critical care time does not reflect procedure time, or teaching time or supervisory time of PA/NP/Med student/Med Resident etc but could involve care discussion time. Rest per NP/medical resident whose note is outlined above and that I agree with   Mcarthur Rossetti. Tyson Alias, MD, FACP Pgr: [phone] [facility] 08/28/2016 8:45 AM

## 2016-08-28 NOTE — Progress Notes (Signed)
Inpatient Diabetes Program Recommendations  AACE/ADA: New Consensus Statement on Inpatient Glycemic Control (2015)  Target Ranges:  Prepandial:   less than 140 mg/dL      Peak postprandial:   less than 180 mg/dL (1-2 hours)      Critically ill patients:  140 - 180 mg/dL   Lab Results  Component Value Date   GLUCAP 247 (H) 08/28/2016    Review of Glycemic Control  Diabetes history: None Outpatient Diabetes medications: None Current orders for Inpatient glycemic control: Novolog resistant q 4 hours, Novolog 3 units q 4 hours, Lantus 15 units daily  Inpatient Diabetes Program Recommendations:  Patient receiving IV Solumedrol 60 mg Q12hrs. Glucose trends in the 200's. Patient receiving Tube Feeds Vital AF 1.2 at 5075ml/hr. May consider IV insulin-ICU glycemic control order set if appropriate.   Thanks,  Christena DeemShannon Derin Matthes RN, MSN, [facility] Inpatient Diabetes Coordinator Team Pager [phone] (8a-5p)

## 2016-08-29 DIAGNOSIS — Z930 Tracheostomy status: Secondary | ICD-10-CM

## 2016-08-29 DIAGNOSIS — Z978 Presence of other specified devices: Secondary | ICD-10-CM

## 2016-08-29 LAB — BASIC METABOLIC PANEL
ANION GAP: 6 (ref 5–15)
BUN: 41 mg/dL — AB (ref 6–20)
CHLORIDE: 107 mmol/L (ref 101–111)
CO2: 33 mmol/L — ABNORMAL HIGH (ref 22–32)
Calcium: 8.2 mg/dL — ABNORMAL LOW (ref 8.9–10.3)
Creatinine, Ser: 0.82 mg/dL (ref 0.61–1.24)
GFR calc non Af Amer: >60 mL/min (ref >60)
Glucose, Bld: 273 mg/dL — ABNORMAL HIGH (ref 65–99)
POTASSIUM: 4.3 mmol/L (ref 3.5–5.1)
SODIUM: 146 mmol/L — AB (ref 135–145)

## 2016-08-29 LAB — CBC
HCT: 44.2 % (ref 39.0–52.0)
HEMOGLOBIN: 13.7 g/dL (ref 13.0–17.0)
MCH: 30.2 pg (ref 26.0–34.0)
MCHC: 31 g/dL (ref 30.0–36.0)
MCV: 97.6 fL (ref 78.0–100.0)
Platelets: 149 10*3/uL — ABNORMAL LOW (ref 150–400)
RBC: 4.53 MIL/uL (ref 4.22–5.81)
RDW: 15.1 % (ref 11.5–15.5)
WBC: 25.1 10*3/uL — AB (ref 4.0–10.5)

## 2016-08-29 LAB — GLUCOSE, CAPILLARY
GLUCOSE-CAPILLARY: 179 mg/dL — AB (ref 65–99)
GLUCOSE-CAPILLARY: 193 mg/dL — AB (ref 65–99)
GLUCOSE-CAPILLARY: 238 mg/dL — AB (ref 65–99)
GLUCOSE-CAPILLARY: 247 mg/dL — AB (ref 65–99)
Glucose-Capillary: 272 mg/dL — ABNORMAL HIGH (ref 65–99)
Glucose-Capillary: 296 mg/dL — ABNORMAL HIGH (ref 65–99)

## 2016-08-29 LAB — BLOOD GAS, ARTERIAL
ACID-BASE EXCESS: 8.5 mmol/L — AB (ref 0.0–2.0)
BICARBONATE: 32.8 mmol/L — AB (ref 20.0–28.0)
Drawn by: 36277
FIO2: 0.4
LHR: 20 {breaths}/min
O2 Saturation: 95.8 %
PEEP/CPAP: 5 cmH2O
PRESSURE CONTROL: 20 cmH2O
Patient temperature: 98.6
pCO2 arterial: 47.4 mmHg (ref 32.0–48.0)
pH, Arterial: 7.454 — ABNORMAL HIGH (ref 7.350–7.450)
pO2, Arterial: 82.4 mmHg — ABNORMAL LOW (ref 83.0–108.0)

## 2016-08-29 LAB — PHOSPHORUS: PHOSPHORUS: 3.7 mg/dL (ref 2.5–4.6)

## 2016-08-29 LAB — MAGNESIUM: MAGNESIUM: 2.5 mg/dL — AB (ref 1.7–2.4)

## 2016-08-29 MED ORDER — FREE WATER
250.0000 mL | Freq: Two times a day (BID) | Status: DC
  Administered 2016-08-29 – 2016-08-31 (×5): 250 mL

## 2016-08-29 MED ORDER — FUROSEMIDE 10 MG/ML IJ SOLN
40.0000 mg | Freq: Once | INTRAMUSCULAR | Status: AC
Start: 2016-08-29 — End: 2016-08-29
  Administered 2016-08-29: 40 mg via INTRAVENOUS

## 2016-08-29 MED ORDER — RISPERIDONE 2 MG PO TABS
2.0000 mg | ORAL_TABLET | Freq: Two times a day (BID) | ORAL | Status: DC
Start: 2016-08-29 — End: 2016-08-31
  Administered 2016-08-29 – 2016-08-30 (×3): 2 mg
  Filled 2016-08-29 (×5): qty 1

## 2016-08-29 MED ORDER — POLYETHYLENE GLYCOL 3350 17 G PO PACK
17.0000 g | PACK | Freq: Every day | ORAL | Status: DC | PRN
  Filled 2016-08-29: qty 1

## 2016-08-29 MED ORDER — HALOPERIDOL LACTATE 5 MG/ML IJ SOLN: 1.0000 mg | INTRAMUSCULAR | Status: DC | PRN

## 2016-08-29 MED ORDER — INSULIN ASPART 100 UNIT/ML ~~LOC~~ SOLN
0.0000 [IU] | SUBCUTANEOUS | Status: DC
  Administered 2016-08-29: 7 [IU] via SUBCUTANEOUS
  Administered 2016-08-29 (×2): 11 [IU] via SUBCUTANEOUS
  Administered 2016-08-29: 7 [IU] via SUBCUTANEOUS
  Administered 2016-08-29: 11 [IU] via SUBCUTANEOUS
  Administered 2016-08-29 (×2): 4 [IU] via SUBCUTANEOUS
  Administered 2016-08-30 (×2): 7 [IU] via SUBCUTANEOUS
  Administered 2016-08-30: 4 [IU] via SUBCUTANEOUS
  Administered 2016-08-30: 11 [IU] via SUBCUTANEOUS
  Administered 2016-08-30 – 2016-08-31 (×2): 4 [IU] via SUBCUTANEOUS
  Administered 2016-08-31: 7 [IU] via SUBCUTANEOUS
  Administered 2016-08-31: 11 [IU] via SUBCUTANEOUS
  Administered 2016-08-31: 4 [IU] via SUBCUTANEOUS

## 2016-08-29 NOTE — Progress Notes (Signed)
eLink Physician-Brief Progress Note Patient Name: Bradley FraiseDavid M Trumbo DOB: [birth year] MRN: [id number]   Date of Service  08/29/2016  HPI/Events of Note  Nurse calls as patient's sugar has been elevated. Fluids recently switched to D5. Patient is getting TF Coverage. Patient is on steroids.   eICU Interventions  Will switch sliding scale to resistant.   May need to increase Lantus in the morning or increase TF coverage. Day team to assess in am.      Intervention Category Major Interventions: Other:  Daneen SchickJose Angelo A De Dios 08/29/2016, 12:01 AM

## 2016-08-29 NOTE — Progress Notes (Signed)
PULMONARY / CRITICAL CARE MEDICINE   Name: Bradley [name] MRN: [id number] DOB: 02-Sep-1961    ADMISSION DATE:  08/17/2016 CONSULTATION DATE:  08/19/16  REFERRING MD:  Dr Thayer Ohmhris Rama  CHIEF COMPLAINT:  Acute resp failure with hypoxemia  BRIEF 55 year old smoker, with PFT nov 2016 showing isolated reduction in dlco to 59% and quite functional and not on home o2. Followed by Dr Sherene SiresWert -0 last seen Sept/Nov 2016 based on CT chest evidence showing diffuse changes of centrilobular and paraseptal emphysema along with prominent interstitial markings primarily in the upper lobes consistent with pulmonary fibrosis. This was a non--UIP pattern. Patient recommended quitting smoking because of concerns of DIP. According to the patient and his wife, has continued to smoke. He has been in his baseline status of health and very functional. Then on 08/14/2016 he had a chest x-ray preoperative that only showed chronic changes without change for a right shoulder surgery rotator cuff tear. The procedure was uneventful. Postoperative course was uneventful. There was no vomiting. Then abruptly on 08/16/2016 he started getting acutely ill with shortness of breath and cough with brown colored sputum. Then admitted to the hospital. Initial white count was 13,000. BNP was normal. CT angiogram chest ruled out pulmonary embolism but showed diffuse ground glass opacities and enlarged mediastinal node measuring close to 2 cm. He was started on community acquired pneumonia antibiotics but is developed progressive acute respiratory failure necessitating Solu-Medrol since 08/18/2016 evening. And from the early hours of 08/19/2016 he's been on BiPAP with significant respiratory distress and therefore pulmonary critical care has been consulted  At baseline he denies any mold exposure or mildew exposure or working with dust. He denies any birds in the house. He continues to smoke.   Events: 08/17/2016 - admit 08/18/2016-respiratory  virus panel, HIV and flu panel PCR negative 08/19/16 -bipap, move to icu -> intubated 10/21 10/22 repeat RVP from trach aspirate - negative 10/23 levo off 10/24 nimbex off  SUBJECTIVE/OVERNIGHT/INTERVAL HX 08/20/16 -> Intubated overnight (DIFFICULT). On ARDS protocol with nimbex now. ECHO: LVEF 60-65%. normal wall thickness, normal wall motion, normal LA size, moderate TR, RVSP 56 mmHg (moderate pulmonary hypertension), estimated RA pressure of 8 mmHg. 10/23 --> improved settings on vent. Levo off.  10/24 --> P to F ratio improved to 205 with today's ABG; CVP 5; FiO2 40, PEEP 10, RR 35, TV 440; paralyzed with nimbex 2 mcg 10/25 --> RR adjusted from 35 to 30 overnight due to respiratory acidosis. Did not improve gas, so RR decreased to 30 again. P/F ratios of 275 and 171, respectively.  10/26 --> ABGs improved on pressure control vent settings. FiO2 increased from 40 to 60 overnight due to desaturations.  10/29 - beginning to wean 10/30 - Fever to 100.8 F yesterday 10/31 - Fever to 100.9 F overnight. Not tolerating wean of sedation without becoming very asynchronous with vent. On 20 of PC above PEEP.   VITAL SIGNS: BP 93/65   Pulse 96   Temp (!) 100.7 F (38.2 C) (Oral)   Resp 17   Ht 5\' 9"  (1.753 m)   Wt 212 lb (96.2 kg)   SpO2 96%   BMI 31.31 kg/m   HEMODYNAMICS: CVP:  [2 mmHg-6 mmHg] 2 mmHg  VENTILATOR SETTINGS: Vent Mode: PCV FiO2 (%):  [40 %-50 %] 40 % Set Rate:  [20 bmp] 20 bmp PEEP:  [5 cmH20] 5 cmH20 Plateau Pressure:  [13 cmH20-17 cmH20] 13 cmH20  INTAKE / OUTPUT: I/O last 3 completed shifts:  In: 5165.2 [I.V.:1044; WG/NF:6213.0; IV Piggyback:250] Out: 4175 [Urine:4175]   Abx: Zosyn 10/21>>>10/30 Vanc 10/21>>>10/26  Lines: Foley 10/21 >> ETT and OG 10/21 >> CVC 10/22 >> PIV x 3 A-line left radial 10/22 >>>10/30  PHYSICAL EXAMINATION: General:  Middle-aged, sedated, ETT in place Neuro: Sedated, moves to sternal rub but no purposeful movement HEENT:  ETT  in place Cardiovascular:  Regular rate and rhythm Lungs: Decreased BS at bases Abdomen:  Soft nontender Musculoskeletal:  No cyanosis no clubbing no edema. Looks euvolemic; clean dry bandage over R shoulder Skin:  Intact on exposed areas  LABS:  PULMONARY  Recent Labs Lab 08/23/16 1302 08/23/16 1700  08/24/16 1405 08/25/16 0249  08/26/16 1520 08/27/16 0330 08/28/16 0352 08/28/16 1000 08/29/16 0300  PHART 7.399 7.395  < > 7.384 7.388  < > 7.49* 7.464* 7.478* 7.446 7.454*  PCO2ART 70.1* 56.4*  < > 61.8* 61.7*  < > 47.1 48.3* 44.9 49.2* 47.4  PO2ART 260.0* 104.0  < > 97.0 91.0  < > 89.1 81.2* 79.3* 89.0 82.4*  HCO3 42.9* 34.2*  < > 36.7* 36.7*  < > 35.5* 34.2* 32.9* 33.4* 32.8*  TCO2 45 36  --  38 39  --   --   --   --   --   --   O2SAT 100.0 98.0  < > 97.0 96.0  < > 96.5 95.5 95.6 96.6 95.8  < > = values in this interval not displayed. CBC  Recent Labs Lab 08/27/16 0500 08/28/16 0624 08/29/16 0327  HGB 14.7 14.3 13.7  HCT 45.8 45.2 44.2  WBC 24.2* 24.7* 25.1*  PLT 179 156 149*   COAGULATION No results for input(s): INR in the last 168 hours. CARDIAC   No results for input(s): TROPONINI in the last 168 hours. No results for input(s): PROBNP in the last 168 hours.  CHEMISTRY  Recent Labs Lab 08/25/16 0443 08/25/16 0450 08/26/16 0344 08/27/16 0500 08/28/16 0624 08/28/16 1556 08/29/16 0327  NA  --  143 143 149* 148* 146* 146*  K  --  4.2 3.5 4.7 4.0 4.0 4.3  CL  --  101 103 108 109 108 107  CO2  --  34* 32 34* 32 31 33*  GLUCOSE  --  205* 188* 168* 257* 253* 273*  BUN  --  54* 47* 42* 41* 45* 41*  CREATININE  --  1.12 0.95 0.87 0.88 0.92 0.82  CALCIUM  --  8.8* 8.6* 8.8* 8.2* 8.3* 8.2*  MG 2.7*  --  2.4 2.7* 2.6*  --  2.5*  PHOS  --  5.7* 4.2 3.7 3.4  --  3.7   Estimated Creatinine Clearance: 116.5 mL/min (by C-G formula based on SCr of 0.82 mg/dL).  LIVER  Recent Labs Lab 08/25/16 0450  ALBUMIN 2.8*   INFECTIOUS No results for input(s):  LATICACIDVEN, PROCALCITON in the last 168 hours. ENDOCRINE CBG (last 3)   Recent Labs  08/28/16 1951 08/28/16 2354 08/29/16 0342  GLUCAP 232* 282* 272*   IMAGING x48h  - image(s) personally visualized  -   highlighted in bold Dg Chest Port 1 View  Result Date: 08/28/2016 CLINICAL DATA:  Shortness of breath. EXAM: PORTABLE CHEST 1 VIEW COMPARISON:  08/27/2016. FINDINGS: Endotracheal tube, NG tube, right IJ line in stable position. Heart size stable. Low lung volumes again noted. Unchanged bibasilar pulmonary infiltrates. Tiny bilateral pleural effusions cannot be excluded. No pneumothorax . IMPRESSION: 1. Lines and tubes in stable position. 2. Lung volumes. Unchanged bibasilar pulmonary infiltrates.  Tiny bilateral pleural effusions cannot be excluded. Electronically Signed   By: Maisie Fus  Register   On: 08/28/2016 06:56   Dg Chest Port 1 View  Result Date: 08/27/2016 CLINICAL DATA:  Intubated EXAM: PORTABLE CHEST 1 VIEW COMPARISON:  Chest radiograph from one day prior. FINDINGS: Endotracheal tube tip is 3.2 cm above the carina. Enteric tube enters stomach with the tip not seen on this image. Right internal jugular central venous catheter terminates in the middle third of the superior vena cava. Stable cardiomediastinal silhouette with top-normal heart size and aortic atherosclerosis. No pneumothorax. No pleural effusion. No pulmonary edema. Worsened low lung volumes. Mild patchy bibasilar lung opacities appear increased bilaterally. IMPRESSION: 1. Well-positioned support structures as described. No pneumothorax . 2. Worsened low lung volumes. Worsened mild patchy bibasilar lung opacities, favor atelectasis, cannot exclude aspiration or pneumonia. 3. Aortic atherosclerosis. Electronically Signed   By: Delbert Phenix M.D.   On: 08/27/2016 09:03   Summary:  55-y/o male who developed acute respiratory distress after shoulder surgery likely 2/2 ILD but treated for possible infectious etiology. Moving  towards extubation, but mental status has been a barrier.   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 No known ILD exposures per hx  #Admit 08/17/2016 - Abrupt respiratory illness with acute hypoxemic respiratory failure with significant deterioration 3 days after right shoulder surgery. Pulmonary embolism ruled out. Diffuse ground glass opacities on CT chest at admission  - Differential diagnosis includes: Undiagnosed IPF flare, acute lung injury postop idiopathic, Boop, acute eosinophilic pneumonia and viral induced ARDS (less likely as repeat negative)  #Current 08/29/16  - improving ARDS, off nimbex 10/24. On 40% fio2, peep 5.  - Trach cx with normal respiratory flora - BAL with lavage 10/24 unremarkable - Autoimmune and vasculitis labs - negative except for RF mildly elevated at 17.6 - CXR improved aeration . Remains in neg I/o bal , Pco2 trend down   P:   ARDS NET protocol since 08/20/16. PCV given asynchrony and concern for auto-PEEP Continue steroids until extubated but decreased to 60 mg IV q12, consider reduction Ipratroprium and xopenex q6h. SBt planned Hold lasix for CVP of 2 ABG reviewed, drop rate  Need improve neurostatus  CARDIOVASCULAR A:  MI ruled out but troponin increased to 0.07. Suspect demand. ECHO ok 10/22 - started on levophed, discontinued 10/23 Net negative 1.2 P:  Monitor Obtain q6h CVP- goal 4 - dc inaccurate Even balance goals to neg    RENAL A:   At risk for lactic acidosis and renal injury S/p diamox  Free water deficit 2.5 L AKI resolved P:   Free water to 250 q 4 , limit D5W @ 30 Held diuresis x2 days, resumed 10/31 Replace electrolytes as indicated. BMET in AM.  GASTROINTESTINAL A:   At risk for aspiration with intubation P:   NPO except  meds TFs per nutrition  HEMATOLOGIC A:   At risk for anemia of critical illness Leukocytosis --> 25.1 with likely contribution from demargination from steroid use P:  Monitor - stable. DVT prophylaxis - lovenox  INFECTIOUS A:   Unclear if this is community acquired pneumonia- treated P:   Follow fever curve WBC  ENDOCRINE A:   At risk for hyperglycemia  - CBGs in mid 200s  P:   Lantus increased to 25 SSI increased to resistant scale TF coverage  with novolog 5 U q6h Solumedrol 40 q12h (day 11), reduced 10/30  NEUROLOGIC A:   At risk for delirium Unresponsive on minimal sedation 10/25 -- suspect 2/2 hypercapnia. Head CT 10/25 without any acute changes P:   Monitor RASS goal: -1 Fentanyl drip off for SBT Diprivan off for SBT Add Risperdal 2 q12h, add haldol IV q4h prn, assess qtc prior  FAMILY  - Updates: No family bedside this AM  Dani GobbleHillary Fitzgerald, MD [facility], PGY-2  08/29/2016   STAFF NOTE: Cindi CarbonI, Janene Yousuf, MD FACP have personally reviewed patient's available data, including medical history, events of note, physical examination and test results as part of my evaluation. I have discussed with resident/NP and other care providers such as pharmacist, RN and RRT. In addition, I personally evaluated patient and elicited key findings of: agitated with wua, coarse BS, pos balance last 24 hours, pcxr none this am , abg reviewed, reduce rate, wean if able to reduce sedation, fent drip to remain, interrupt prop, add Risperdal, add haldol , check qtc, avoid benzo, behavior modification, cvp not accurate - dc, lasix re add renal fxn tolerating, keep free d5w, feeding successful, if neuro not improved may need imaging further, steroids likely to reduce in am , increase lantus, I updated wife, keep ing plat less 30  The patient is critically ill with multiple organ systems failure and requires high complexity decision making for assessment and support,  frequent evaluation and titration of therapies, application of advanced monitoring technologies and extensive interpretation of multiple databases.   Critical Care Time devoted to patient care services described in this note is 35 Minutes. This time reflects time of care of this signee: Rory Percyaniel Tanysha Quant, MD FACP. This critical care time does not reflect procedure time, or teaching time or supervisory time of PA/NP/Med student/Med Resident etc but could involve care discussion time. Rest per NP/medical resident whose note is outlined above and that I agree with   Mcarthur Rossettianiel J. Tyson AliasFeinstein, MD, FACP Pgr: [phone] [facility] 08/29/2016 8:52 AM

## 2016-08-30 ENCOUNTER — Inpatient Hospital Stay (HOSPITAL_COMMUNITY): Payer: BLUE CROSS/BLUE SHIELD

## 2016-08-30 LAB — CBC
HCT: 42.7 % (ref 39.0–52.0)
HEMOGLOBIN: 13.5 g/dL (ref 13.0–17.0)
MCH: 30 pg (ref 26.0–34.0)
MCHC: 31.6 g/dL (ref 30.0–36.0)
MCV: 94.9 fL (ref 78.0–100.0)
PLATELETS: 155 10*3/uL (ref 150–400)
RBC: 4.5 MIL/uL (ref 4.22–5.81)
RDW: 14.7 % (ref 11.5–15.5)
WBC: 20.5 10*3/uL — AB (ref 4.0–10.5)

## 2016-08-30 LAB — BASIC METABOLIC PANEL
ANION GAP: 8 (ref 5–15)
BUN: 41 mg/dL — ABNORMAL HIGH (ref 6–20)
CALCIUM: 8.2 mg/dL — AB (ref 8.9–10.3)
CO2: 34 mmol/L — ABNORMAL HIGH (ref 22–32)
Chloride: 105 mmol/L (ref 101–111)
Creatinine, Ser: 0.91 mg/dL (ref 0.61–1.24)
GLUCOSE: 200 mg/dL — AB (ref 65–99)
POTASSIUM: 4.2 mmol/L (ref 3.5–5.1)
Sodium: 147 mmol/L — ABNORMAL HIGH (ref 135–145)

## 2016-08-30 LAB — GLUCOSE, CAPILLARY
GLUCOSE-CAPILLARY: 188 mg/dL — AB (ref 65–99)
GLUCOSE-CAPILLARY: 282 mg/dL — AB (ref 65–99)
Glucose-Capillary: 217 mg/dL — ABNORMAL HIGH (ref 65–99)
Glucose-Capillary: 197 mg/dL — ABNORMAL HIGH (ref 65–99)
Glucose-Capillary: 207 mg/dL — ABNORMAL HIGH (ref 65–99)

## 2016-08-30 LAB — MAGNESIUM: MAGNESIUM: 2.4 mg/dL (ref 1.7–2.4)

## 2016-08-30 LAB — PHOSPHORUS: PHOSPHORUS: 3.8 mg/dL (ref 2.5–4.6)

## 2016-08-30 LAB — TRIGLYCERIDES: Triglycerides: 180 mg/dL — ABNORMAL HIGH (ref <150)

## 2016-08-30 MED ORDER — METHYLPREDNISOLONE SODIUM SUCC 125 MG IJ SOLR
60.0000 mg | INTRAMUSCULAR | Status: DC
  Administered 2016-08-31: 60 mg via INTRAVENOUS
  Filled 2016-08-30: qty 0.96

## 2016-08-30 MED ORDER — METHYLPREDNISOLONE SODIUM SUCC 40 MG IJ SOLR
20.0000 mg | Freq: Once | INTRAMUSCULAR | Status: AC
  Administered 2016-08-30: 20 mg via INTRAVENOUS
  Filled 2016-08-30: qty 0.5

## 2016-08-30 MED ORDER — ACETAMINOPHEN 160 MG/5ML PO SOLN
650.0000 mg | Freq: Four times a day (QID) | ORAL | Status: DC | PRN
  Administered 2016-08-30 (×2): 650 mg via ORAL
  Filled 2016-08-30 (×2): qty 20.3

## 2016-08-30 MED ORDER — INSULIN GLARGINE 100 UNIT/ML ~~LOC~~ SOLN
3.0000 [IU] | SUBCUTANEOUS | Status: AC
  Administered 2016-08-30: 3 [IU] via SUBCUTANEOUS
  Filled 2016-08-30: qty 0.03

## 2016-08-30 MED ORDER — ACETAMINOPHEN 10 MG/ML IV SOLN
1000.0000 mg | Freq: Four times a day (QID) | INTRAVENOUS | Status: DC
  Filled 2016-08-30 (×2): qty 100

## 2016-08-30 MED ORDER — GADOBENATE DIMEGLUMINE 529 MG/ML IV SOLN
20.0000 mL | Freq: Once | INTRAVENOUS | Status: AC
  Administered 2016-08-30: 20 mL via INTRAVENOUS

## 2016-08-30 MED ORDER — INSULIN GLARGINE 100 UNIT/ML ~~LOC~~ SOLN
28.0000 [IU] | Freq: Every day | SUBCUTANEOUS | Status: DC
  Administered 2016-08-31: 28 [IU] via SUBCUTANEOUS
  Filled 2016-08-30: qty 0.28

## 2016-08-30 NOTE — Progress Notes (Signed)
RT note-Patient transported down to MRI and returned. Moderate amount secretions thick yellow.

## 2016-08-30 NOTE — Progress Notes (Signed)
Wasted 150mL fentanyl in sink with Hermelinda MedicusIrekia Baynes RN

## 2016-08-30 NOTE — Progress Notes (Signed)
PULMONARY / CRITICAL CARE MEDICINE   Name: Bradley [name] MRN: [id number] DOB: Oct 16, 1961    ADMISSION DATE:  08/17/2016 CONSULTATION DATE:  08/19/16  REFERRING MD:  Dr Thayer Ohmhris Rama  CHIEF COMPLAINT:  Acute resp failure with hypoxemia  BRIEF 55 year old smoker, with PFT nov 2016 showing isolated reduction in dlco to 59% and quite functional and not on home o2. Followed by Dr Sherene SiresWert -0 last seen Sept/Nov 2016 based on CT chest evidence showing diffuse changes of centrilobular and paraseptal emphysema along with prominent interstitial markings primarily in the upper lobes consistent with pulmonary fibrosis. This was a non--UIP pattern. Patient recommended quitting smoking because of concerns of DIP. According to the patient and his wife, has continued to smoke. He has been in his baseline status of health and very functional. Then on 08/14/2016 he had a chest x-ray preoperative that only showed chronic changes without change for a right shoulder surgery rotator cuff tear. The procedure was uneventful. Postoperative course was uneventful. There was no vomiting. Then abruptly on 08/16/2016 he started getting acutely ill with shortness of breath and cough with brown colored sputum. Then admitted to the hospital. Initial white count was 13,000. BNP was normal. CT angiogram chest ruled out pulmonary embolism but showed diffuse ground glass opacities and enlarged mediastinal node measuring close to 2 cm. He was started on community acquired pneumonia antibiotics but has developed progressive acute respiratory failure necessitating Solu-Medrol since 08/18/2016 evening. And from the early hours of 08/19/2016 he's been on BiPAP with significant respiratory distress and therefore pulmonary critical care has been consulted.  At baseline he denies any mold exposure or mildew exposure or working with dust. He denies any birds in the house. He continues to smoke.   Events: 08/17/2016 - admit 08/18/2016-respiratory  virus panel, HIV and flu panel PCR negative 08/19/16 -bipap, move to icu -> intubated 10/21 10/22 repeat RVP from trach aspirate - negative 10/23 levo off 10/24 nimbex off  SUBJECTIVE/OVERNIGHT/INTERVAL HX 08/20/16 -> Intubated overnight (DIFFICULT). On ARDS protocol with nimbex now. ECHO: LVEF 60-65%. normal wall thickness, normal wall motion, normal LA size, moderate TR, RVSP 56 mmHg (moderate pulmonary hypertension), estimated RA pressure of 8 mmHg. 10/23 --> improved settings on vent. Levo off.  10/24 --> P to F ratio improved to 205 with today's ABG; CVP 5; FiO2 40, PEEP 10, RR 35, TV 440; paralyzed with nimbex 2 mcg 10/25 --> RR adjusted from 35 to 30 overnight due to respiratory acidosis. Did not improve gas, so RR decreased to 30 again. P/F ratios of 275 and 171, respectively.  10/26 --> ABGs improved on pressure control vent settings. FiO2 increased from 40 to 60 overnight due to desaturations.  10/29 - beginning to wean 10/30 - Fever to 100.8 F yesterday 10/31 - Fever to 100.9 F overnight. Not tolerating wean of sedation without becoming very asynchronous with vent. On 20 of PC above PEEP.  11/1- Fever to 101.9 this morning. Discontinued Propofol this morning.   VITAL SIGNS: BP 119/70   Pulse (!) 103   Temp (!) 101.9 F (38.8 C) (Oral) Comment: Notified RN- Lauren   Resp (!) 23   Ht 5\' 9"  (1.753 m)   Wt 212 lb (96.2 kg)   SpO2 93%   BMI 31.31 kg/m   HEMODYNAMICS: CVP:  [10 mmHg] 10 mmHg  VENTILATOR SETTINGS: Vent Mode: PSV;CPAP FiO2 (%):  [40 %] 40 % Set Rate:  [16 bmp] 16 bmp PEEP:  [5 cmH20] 5 cmH20 Pressure Support:  [  5 cmH20-10 cmH20] 5 cmH20 Plateau Pressure:  [10 cmH20-15 cmH20] 15 cmH20  INTAKE / OUTPUT: I/O last 3 completed shifts: In: 4567.5 [I.V.:982.5; ZO/XW:9604; IV Piggyback:150] Out: 4355 [Urine:4355]   Abx: Zosyn 10/21>>>10/30 Vanc 10/21>>>10/26  Lines: Foley 10/21 >> ETT and OG 10/21 >> CVC 10/22 >> PIV x 3 A-line left radial 10/22  >>>10/30  PHYSICAL EXAMINATION: General:  Middle-aged, sedated, ETT in place Neuro: Sedated, no response to sternal rub  HEENT:  ETT in place Cardiovascular:  Regular rate and rhythm Lungs: Decreased BS at bases Abdomen:  Soft nontender Musculoskeletal:  No cyanosis no clubbing no edema. Looks euvolemic; clean dry bandage over R shoulder Skin:  Intact on exposed areas  LABS:  PULMONARY  Recent Labs Lab 08/23/16 1302 08/23/16 1700  08/24/16 1405 08/25/16 0249  08/26/16 1520 08/27/16 0330 08/28/16 0352 08/28/16 1000 08/29/16 0300  PHART 7.399 7.395  < > 7.384 7.388  < > 7.49* 7.464* 7.478* 7.446 7.454*  PCO2ART 70.1* 56.4*  < > 61.8* 61.7*  < > 47.1 48.3* 44.9 49.2* 47.4  PO2ART 260.0* 104.0  < > 97.0 91.0  < > 89.1 81.2* 79.3* 89.0 82.4*  HCO3 42.9* 34.2*  < > 36.7* 36.7*  < > 35.5* 34.2* 32.9* 33.4* 32.8*  TCO2 45 36  --  38 39  --   --   --   --   --   --   O2SAT 100.0 98.0  < > 97.0 96.0  < > 96.5 95.5 95.6 96.6 95.8  < > = values in this interval not displayed. CBC  Recent Labs Lab 08/28/16 0624 08/29/16 0327 08/30/16 0230  HGB 14.3 13.7 13.5  HCT 45.2 44.2 42.7  WBC 24.7* 25.1* 20.5*  PLT 156 149* 155   COAGULATION No results for input(s): INR in the last 168 hours. CARDIAC   No results for input(s): TROPONINI in the last 168 hours. No results for input(s): PROBNP in the last 168 hours.  CHEMISTRY  Recent Labs Lab 08/26/16 0344 08/27/16 0500 08/28/16 0624 08/28/16 1556 08/29/16 0327 08/30/16 0230  NA 143 149* 148* 146* 146* 147*  K 3.5 4.7 4.0 4.0 4.3 4.2  CL 103 108 109 108 107 105  CO2 32 34* 32 31 33* 34*  GLUCOSE 188* 168* 257* 253* 273* 200*  BUN 47* 42* 41* 45* 41* 41*  CREATININE 0.95 0.87 0.88 0.92 0.82 0.91  CALCIUM 8.6* 8.8* 8.2* 8.3* 8.2* 8.2*  MG 2.4 2.7* 2.6*  --  2.5* 2.4  PHOS 4.2 3.7 3.4  --  3.7 3.8   Estimated Creatinine Clearance: 105 mL/min (by C-G formula based on SCr of 0.91 mg/dL).  LIVER  Recent Labs Lab  08/25/16 0450  ALBUMIN 2.8*   INFECTIOUS No results for input(s): LATICACIDVEN, PROCALCITON in the last 168 hours. ENDOCRINE CBG (last 3)   Recent Labs  08/29/16 2339 08/30/16 0445 08/30/16 0823  GLUCAP 193* 217* 197*   IMAGING x48h  - image(s) personally visualized  -   highlighted in bold No results found.  Summary:  55-y/o male who developed acute respiratory distress after shoulder surgery likely 2/2 ILD but treated for possible infectious etiology. Moving towards extubation, but mental status has been a barrier.   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 No known ILD exposures per hx  #Admit 08/17/2016 - Abrupt respiratory illness  with acute hypoxemic respiratory failure with significant deterioration 3 days after right shoulder surgery. Pulmonary embolism ruled out. Diffuse ground glass opacities on CT chest at admission  - Differential diagnosis includes: Undiagnosed IPF flare, acute lung injury postop idiopathic, Boop, acute eosinophilic pneumonia and viral induced ARDS (less likely as repeat negative)  #Current 08/30/2016  - improving ARDS, off nimbex 10/24. On 40% fio2, peep 5.  - Trach cx with normal respiratory flora - BAL with lavage 10/24 unremarkable - Autoimmune and vasculitis labs - negative except for RF mildly elevated at 17.6 - CXR improved aeration . Remains in neg I/o bal , Pco2 trend down   P:   ARDS NET protocol since 08/20/16. PCV given asynchrony and concern for auto-PEEP Continue steroids until extubated, currently 40 mg q12h  Ipratroprium and xopenex q6h. SBt planned Order ABG  Need improve neurostatus Plan for trach 11/6 if unable to extubate    CARDIOVASCULAR A:  MI ruled out but troponin increased to 0.07. Suspect demand. ECHO ok 10/22 - started on  levophed, discontinued 10/23 Net negative 2L  P:  Monitor Even balance goals to neg    RENAL A:   At risk for lactic acidosis and renal injury S/p diamox  Free water deficit 2.5 L AKI resolved P:   Free water to 250 q 4 , limit Held diuresis x2 days, resumed 10/31 Replace electrolytes as indicated. Follow BMET.   GASTROINTESTINAL A:   At risk for aspiration with intubation P:   NPO except meds TFs per nutrition  HEMATOLOGIC A:   At risk for anemia of critical illness Leukocytosis --> improved to 20.5 with likely contribution from demargination from steroid use P:  Monitor - stable. DVT prophylaxis - lovenox  INFECTIOUS A:   Unclear if this is community acquired pneumonia- treated P:   Follow fever curve WBC Tylenol for fevers   ENDOCRINE A:   At risk for hyperglycemia  - CBGs in upper 100s to mid 200s  P:   Lantus increased to 25 SSI increased to resistant scale TF coverage with novolog 5 U q6h Solumedrol 40 q12h (day 12), reduced 10/30  NEUROLOGIC A:   At risk for delirium Unresponsive on minimal sedation 10/25 -- suspect 2/2 hypercapnia. Head CT 10/25 without any acute changes P:   Monitor RASS goal: -1 Fentanyl at 50  Diprivan off for SBT Risperdal 2 q12h, add haldol IV q4h prn May need further imaging if neuro status does not improve   FAMILY  - Updates: Wife at bedside   Marcy Sirenatherine Richad Ramsay, D.O. 08/30/2016, 10:39 AM PGY-2, [facility]

## 2016-08-31 ENCOUNTER — Inpatient Hospital Stay (HOSPITAL_COMMUNITY): Payer: BLUE CROSS/BLUE SHIELD

## 2016-08-31 DIAGNOSIS — Z029 Encounter for administrative examinations, unspecified: Secondary | ICD-10-CM

## 2016-08-31 LAB — BASIC METABOLIC PANEL
ANION GAP: 7 (ref 5–15)
BUN: 38 mg/dL — ABNORMAL HIGH (ref 6–20)
CHLORIDE: 109 mmol/L (ref 101–111)
CO2: 34 mmol/L — ABNORMAL HIGH (ref 22–32)
Calcium: 8.7 mg/dL — ABNORMAL LOW (ref 8.9–10.3)
Creatinine, Ser: 0.72 mg/dL (ref 0.61–1.24)
GFR calc Af Amer: >60 mL/min (ref >60)
Glucose, Bld: 193 mg/dL — ABNORMAL HIGH (ref 65–99)
POTASSIUM: 3.7 mmol/L (ref 3.5–5.1)
SODIUM: 150 mmol/L — AB (ref 135–145)

## 2016-08-31 LAB — GLUCOSE, CAPILLARY
GLUCOSE-CAPILLARY: 287 mg/dL — AB (ref 65–99)
GLUCOSE-CAPILLARY: 216 mg/dL — AB (ref 65–99)
GLUCOSE-CAPILLARY: 184 mg/dL — AB (ref 65–99)
GLUCOSE-CAPILLARY: 169 mg/dL — AB (ref 65–99)
GLUCOSE-CAPILLARY: 150 mg/dL — AB (ref 65–99)
Glucose-Capillary: 157 mg/dL — ABNORMAL HIGH (ref 65–99)
Glucose-Capillary: 163 mg/dL — ABNORMAL HIGH (ref 65–99)
Glucose-Capillary: 163 mg/dL — ABNORMAL HIGH (ref 65–99)
Glucose-Capillary: 218 mg/dL — ABNORMAL HIGH (ref 65–99)
Glucose-Capillary: 244 mg/dL — ABNORMAL HIGH (ref 65–99)
Glucose-Capillary: 247 mg/dL — ABNORMAL HIGH (ref 65–99)
Glucose-Capillary: 259 mg/dL — ABNORMAL HIGH (ref 65–99)

## 2016-08-31 LAB — CBC
HEMATOCRIT: 44.2 % (ref 39.0–52.0)
HEMOGLOBIN: 14 g/dL (ref 13.0–17.0)
MCH: 30.2 pg (ref 26.0–34.0)
MCHC: 31.7 g/dL (ref 30.0–36.0)
MCV: 95.3 fL (ref 78.0–100.0)
Platelets: 169 10*3/uL (ref 150–400)
RBC: 4.64 MIL/uL (ref 4.22–5.81)
RDW: 15.1 % (ref 11.5–15.5)
WBC: 21.2 10*3/uL — AB (ref 4.0–10.5)

## 2016-08-31 MED ORDER — CHLORHEXIDINE GLUCONATE 0.12% ORAL RINSE (MEDLINE KIT)
15.0000 mL | Freq: Two times a day (BID) | OROMUCOSAL | Status: DC
  Administered 2016-08-31 – 2016-09-08 (×17): 15 mL via OROMUCOSAL

## 2016-08-31 MED ORDER — ACETAMINOPHEN 160 MG/5ML PO SOLN
650.0000 mg | Freq: Four times a day (QID) | ORAL | Status: DC | PRN
  Administered 2016-09-01 – 2016-09-05 (×5): 650 mg via ORAL
  Filled 2016-08-31 (×5): qty 20.3

## 2016-08-31 MED ORDER — INSULIN ASPART 100 UNIT/ML ~~LOC~~ SOLN
7.0000 [IU] | SUBCUTANEOUS | Status: DC
  Administered 2016-08-31: 7 [IU] via SUBCUTANEOUS

## 2016-08-31 MED ORDER — PREDNISONE 20 MG PO TABS
40.0000 mg | ORAL_TABLET | Freq: Every day | ORAL | Status: DC
  Administered 2016-08-31 – 2016-09-02 (×3): 40 mg via NASOGASTRIC
  Filled 2016-08-31 (×3): qty 2

## 2016-08-31 MED ORDER — ORAL CARE MOUTH RINSE
15.0000 mL | Freq: Four times a day (QID) | OROMUCOSAL | Status: DC
  Administered 2016-08-31 – 2016-09-08 (×32): 15 mL via OROMUCOSAL

## 2016-08-31 MED ORDER — SODIUM CHLORIDE 0.9 % IV SOLN
INTRAVENOUS | Status: DC
  Administered 2016-08-31: 1.8 [IU]/h via INTRAVENOUS
  Administered 2016-09-02: 6.3 [IU]/h via INTRAVENOUS
  Filled 2016-08-31 (×3): qty 2.5

## 2016-08-31 NOTE — Progress Notes (Signed)
PULMONARY / CRITICAL CARE MEDICINE   Name: Bradley [name] MRN: [id number] DOB: 1961-10-12    ADMISSION DATE:  08/17/2016 CONSULTATION DATE:  08/19/16  REFERRING MD:  Dr Thayer Ohm Rama  CHIEF COMPLAINT:  Acute resp failure with hypoxemia  BRIEF 55 year old smoker, with PFT nov 2016 showing isolated reduction in dlco to 59% and quite functional and not on home o2. Followed by Dr Sherene Sires -0 last seen Sept/Nov 2016 based on CT chest evidence showing diffuse changes of centrilobular and paraseptal emphysema along with prominent interstitial markings primarily in the upper lobes consistent with pulmonary fibrosis. This was a non--UIP pattern. Patient recommended quitting smoking because of concerns of DIP. According to the patient and his wife, has continued to smoke. He has been in his baseline status of health and very functional. Then on 08/14/2016 he had a chest x-ray preoperative that only showed chronic changes without change for a right shoulder surgery rotator cuff tear. The procedure was uneventful. Postoperative course was uneventful. There was no vomiting. Then abruptly on 08/16/2016 he started getting acutely ill with shortness of breath and cough with brown colored sputum. Then admitted to the hospital. Initial white count was 13,000. BNP was normal. CT angiogram chest ruled out pulmonary embolism but showed diffuse ground glass opacities and enlarged mediastinal node measuring close to 2 cm. He was started on community acquired pneumonia antibiotics but has developed progressive acute respiratory failure necessitating Solu-Medrol since 08/18/2016 evening. And from the early hours of 08/19/2016 he's been on BiPAP with significant respiratory distress and therefore pulmonary critical care has been consulted.  At baseline he denies any mold exposure or mildew exposure or working with dust. He denies any birds in the house. He continues to smoke.   Events: 08/17/2016 - admit 08/18/2016-respiratory  virus panel, HIV and flu panel PCR negative 08/19/16 -bipap, move to icu -> intubated 10/21 10/22 repeat RVP from trach aspirate - negative 10/23 levo off 10/24 nimbex off  SUBJECTIVE/OVERNIGHT/INTERVAL HX 08/20/16 -> Intubated overnight (DIFFICULT). On ARDS protocol with nimbex now. ECHO: LVEF 60-65%. normal wall thickness, normal wall motion, normal LA size, moderate TR, RVSP 56 mmHg (moderate pulmonary hypertension), estimated RA pressure of 8 mmHg. 10/23 --> improved settings on vent. Levo off.  10/24 --> P to F ratio improved to 205 with today's ABG; CVP 5; FiO2 40, PEEP 10, RR 35, TV 440; paralyzed with nimbex 2 mcg 10/25 --> RR adjusted from 35 to 30 overnight due to respiratory acidosis. Did not improve gas, so RR decreased to 30 again. P/F ratios of 275 and 171, respectively.  10/26 --> ABGs improved on pressure control vent settings. FiO2 increased from 40 to 60 overnight due to desaturations.  10/29 - beginning to wean 10/30 - Fever to 100.8 F yesterday 10/31 - Fever to 100.9 F overnight. Not tolerating wean of sedation without becoming very asynchronous with vent. On 20 of PC above PEEP.  11/1- Fever to 101.9 this morning. Very obtunded. MRI brain obtained and negative.  11/2- OG tube out of place this morning. Off all sedating medications.   VITAL SIGNS: BP 118/80   Pulse 94   Temp 99.6 F (37.6 C) (Oral)   Resp (!) 23   Ht 5\' 9"  (1.753 m)   Wt 212 lb (96.2 kg)   SpO2 100%   BMI 31.31 kg/m   HEMODYNAMICS: CVP:  [10 mmHg] 10 mmHg  VENTILATOR SETTINGS: Vent Mode: PCV FiO2 (%):  [40 %] 40 % Set Rate:  [16 bmp]  16 bmp PEEP:  [5 cmH20] 5 cmH20 Pressure Support:  [5 cmH20] 5 cmH20 Plateau Pressure:  [20 cmH20-24 cmH20] 24 cmH20  INTAKE / OUTPUT: I/O last 3 completed shifts: In: 3582.9 [I.V.:482.9; NG/GT:2950; IV Piggyback:150] Out: 4105 [Urine:4105]   Abx: Zosyn 10/21>>>10/30 Vanc 10/21>>>10/26  Lines: Foley 10/21 >> ETT and OG 10/21 >> CVC 10/22 >> PIV x  3 A-line left radial 10/22 >>>10/30  PHYSICAL EXAMINATION: General:  Middle-aged, sedated, ETT in place Neuro: Sedated, no response to sternal rub or voice  HEENT:  ETT in place Cardiovascular:  Regular rate and rhythm Lungs: Decreased BS at bases Abdomen:  Soft nontender Musculoskeletal:  No cyanosis no clubbing no edema. Looks euvolemic; clean dry bandage over R shoulder Skin:  Intact on exposed areas. R LE cool compared to L LE but with good cap refill and pedal pulses.   LABS:  PULMONARY  Recent Labs Lab 08/24/16 1405 08/25/16 0249  08/26/16 1520 08/27/16 0330 08/28/16 0352 08/28/16 1000 08/29/16 0300  PHART 7.384 7.388  < > 7.49* 7.464* 7.478* 7.446 7.454*  PCO2ART 61.8* 61.7*  < > 47.1 48.3* 44.9 49.2* 47.4  PO2ART 97.0 91.0  < > 89.1 81.2* 79.3* 89.0 82.4*  HCO3 36.7* 36.7*  < > 35.5* 34.2* 32.9* 33.4* 32.8*  TCO2 38 39  --   --   --   --   --   --   O2SAT 97.0 96.0  < > 96.5 95.5 95.6 96.6 95.8  < > = values in this interval not displayed. CBC  Recent Labs Lab 08/28/16 0624 08/29/16 0327 08/30/16 0230  HGB 14.3 13.7 13.5  HCT 45.2 44.2 42.7  WBC 24.7* 25.1* 20.5*  PLT 156 149* 155   COAGULATION No results for input(s): INR in the last 168 hours. CARDIAC   No results for input(s): TROPONINI in the last 168 hours. No results for input(s): PROBNP in the last 168 hours.  CHEMISTRY  Recent Labs Lab 08/26/16 0344 08/27/16 0500 08/28/16 0624 08/28/16 1556 08/29/16 0327 08/30/16 0230  NA 143 149* 148* 146* 146* 147*  K 3.5 4.7 4.0 4.0 4.3 4.2  CL 103 108 109 108 107 105  CO2 32 34* 32 31 33* 34*  GLUCOSE 188* 168* 257* 253* 273* 200*  BUN 47* 42* 41* 45* 41* 41*  CREATININE 0.95 0.87 0.88 0.92 0.82 0.91  CALCIUM 8.6* 8.8* 8.2* 8.3* 8.2* 8.2*  MG 2.4 2.7* 2.6*  --  2.5* 2.4  PHOS 4.2 3.7 3.4  --  3.7 3.8   Estimated Creatinine Clearance: 105 mL/min (by C-G formula based on SCr of 0.91 mg/dL).  LIVER  Recent Labs Lab 08/25/16 0450  ALBUMIN  2.8*   INFECTIOUS No results for input(s): LATICACIDVEN, PROCALCITON in the last 168 hours. ENDOCRINE CBG (last 3)   Recent Labs  08/30/16 2012 08/31/16 0011 08/31/16 0318  GLUCAP 282* 163* 157*   IMAGING x48h  - image(s) personally visualized  -   highlighted in bold Mr Brain W Wo Contrast  Result Date: 08/30/2016 CLINICAL DATA:  Altered mental status EXAM: MRI HEAD WITHOUT AND WITH CONTRAST TECHNIQUE: Multiplanar, multiecho pulse sequences of the brain and surrounding structures were obtained without and with intravenous contrast. CONTRAST:  20mL MULTIHANCE GADOBENATE DIMEGLUMINE 529 MG/ML IV SOLN COMPARISON:  Head CT 08/18/2016 FINDINGS: Brain: No acute infarct or intraparenchymal hemorrhage. The midline structures are normal. No focal parenchymal signal abnormality. Apparent low ADC values are favored to be due to technical factors, as the distribution follows the  corticospinal tracts rather than an expected pathologic pattern. No mass lesion or midline shift. No hydrocephalus or extra-axial fluid collection. Vascular: Major intracranial arterial and venous sinus flow voids are preserved. No evidence of chronic microhemorrhage or amyloid angiopathy. Skull and upper cervical spine: The visualized skull base, calvarium, upper cervical spine and extracranial soft tissues are normal. Sinuses/Orbits: Small amount of bilateral mastoid fluid. Normal orbits. IMPRESSION: Normal brain MRI for age. Electronically Signed   By: Deatra RobinsonKevin  Herman M.D.   On: 08/30/2016 15:35    Summary:  55-y/o male who developed acute respiratory distress after shoulder surgery likely 2/2 ILD but treated for possible infectious etiology. Moving towards extubation, but mental status has been a barrier.   ASSESSMENT / PLAN:  PULMONARY A: #Baseline - Chronic smoker - September 2016 with isolated reduction in diffusion capacity associated with emphysema and non-UIP pattern of interstitial lung disease - not otherwise  specified. No known ILD exposures per hx  #Admit 08/17/2016 - Abrupt respiratory illness with acute hypoxemic respiratory failure with significant deterioration 3 days after right shoulder surgery. Pulmonary embolism ruled out. Diffuse ground glass opacities on CT chest at admission  - Differential diagnosis includes: Undiagnosed IPF flare, acute lung injury postop idiopathic, Boop, acute eosinophilic pneumonia and viral induced ARDS (less likely as repeat negative)  #Current 08/30/2016  - improving ARDS, off nimbex 10/24. On 40% fio2, peep 5.  - Trach cx with normal respiratory flora - BAL with lavage 10/24 unremarkable - Autoimmune and vasculitis labs - negative except for RF mildly elevated at 17.6 - CXR improved aeration . Remains in neg I/o bal , Pco2 trend down   P:   ARDS NET protocol since 08/20/16. PCV given asynchrony and concern for auto-PEEP Continue steroids until extubated, currently 60 mg daily  Ipratroprium and xopenex q6h. SBt planned Need improve neurostatus Plan for trach 11/6 if unable to extubate    CARDIOVASCULAR A:  MI ruled out but troponin increased to 0.07. Suspect demand. ECHO ok 10/22 - started on levophed, discontinued 10/23 Net negative 1.6L  P:  Monitor Even balance goals to neg    RENAL A:   At risk for lactic acidosis and renal injury S/p diamox  Free water deficit 2.5 L AKI resolved P:   Free water to 250 BID  Held diuresis x2 days, given again 10/31 Replace electrolytes as indicated. Follow BMET.   GASTROINTESTINAL A:   At risk for aspiration with intubation P:   NPO except meds TFs per nutrition  HEMATOLOGIC A:   At risk for anemia of critical illness Leukocytosis --> 21.2 with likely contribution from demargination from steroid use P:  Monitor - stable. DVT prophylaxis - lovenox  INFECTIOUS A:   Unclear if this is community acquired pneumonia- treated Fever curve improving  P:   Follow fever curve WBC Tylenol for  fevers   ENDOCRINE A:   At risk for hyperglycemia  - CBGs still mostly above goal of 140-180  P:   Lantus increased to 28, would increase further today   SSI increased to resistant scale TF coverage with novolog 5 U q6h Solumedrol 60 daily (day 13), reduced 11/1  NEUROLOGIC A:   At risk for delirium Unresponsive on minimal sedation 10/25 -- suspect 2/2 hypercapnia. Head CT 10/25 without any acute changes Current, 11/2: Obtunded Encephalopathy with negative MRI  P:   Monitor RASS goal: -1 Discontinued Fentanyl  Discontinued Diprivan  Risperdal 2 q12h, add haldol IV q4h prn  FAMILY  - Updates: Wife  not at bedside   Marcy Sirenatherine Emerald Shor, D.O. 08/31/2016, 6:28 AM PGY-2, [facility]

## 2016-08-31 NOTE — Procedures (Signed)
EEG Report  Clinical history:  Coma with no sedation for 24 hours and normal MRI Brain  Technical Summary:  A 19 channel digital EEG recording was performed using the 10-20 international system of electrode placement.  Bipolar and referential montages were used.  Total recording time was approx 20 minutes.    EEG description:  Generalized background frequencies are in the 5-6 Hz range and symmetrical between both hemispheres.  Throughout the recording, there are no epileptiform discharges or electrographic seizures present.  He shows no response to painful stimulation and the EEG is unreactive during it.    Impression:  This is an abnormal EEG.  There is evidence of moderate generalized slowing of brain activity.   This is non-specific but may be related to residuals of sedation, toxic, metabolic,hypoxic, or infectious etiologies.  Clinical correlation is advised.  The patient is not in non-convulsive status.  Weston SettleShervin Micheala Morissette, MS, MD

## 2016-08-31 NOTE — Progress Notes (Signed)
Pt OG tube auscultated during 0800 assessment, no air burp heard. Tape securing the OG tube to the ET tube was loose. Tube feeds held, OG tube advanced, tube secured, and abdomen xray ordered to confirm new placement. Will re-start tube feeds on confirmation of placement.

## 2016-08-31 NOTE — Progress Notes (Signed)
Bedside EEG completed, results pending. 

## 2016-09-01 ENCOUNTER — Inpatient Hospital Stay (HOSPITAL_COMMUNITY): Payer: BLUE CROSS/BLUE SHIELD

## 2016-09-01 ENCOUNTER — Ambulatory Visit (HOSPITAL_COMMUNITY): Payer: BLUE CROSS/BLUE SHIELD

## 2016-09-01 DIAGNOSIS — M79661 Pain in right lower leg: Secondary | ICD-10-CM

## 2016-09-01 DIAGNOSIS — R208 Other disturbances of skin sensation: Secondary | ICD-10-CM

## 2016-09-01 DIAGNOSIS — R4182 Altered mental status, unspecified: Secondary | ICD-10-CM

## 2016-09-01 DIAGNOSIS — J9601 Acute respiratory failure with hypoxia: Secondary | ICD-10-CM

## 2016-09-01 DIAGNOSIS — R0989 Other specified symptoms and signs involving the circulatory and respiratory systems: Secondary | ICD-10-CM

## 2016-09-01 LAB — HEPATIC FUNCTION PANEL
ALBUMIN: 2.3 g/dL — AB (ref 3.5–5.0)
ALK PHOS: 31 U/L — AB (ref 38–126)
ALT: 62 U/L (ref 17–63)
AST: 32 U/L (ref 15–41)
Bilirubin, Direct: 0.2 mg/dL (ref 0.1–0.5)
Indirect Bilirubin: 0.5 mg/dL (ref 0.3–0.9)
TOTAL PROTEIN: 5.3 g/dL — AB (ref 6.5–8.1)
Total Bilirubin: 0.7 mg/dL (ref 0.3–1.2)

## 2016-09-01 LAB — CBC
HCT: 45.6 % (ref 39.0–52.0)
Hemoglobin: 14.5 g/dL (ref 13.0–17.0)
MCH: 30.5 pg (ref 26.0–34.0)
MCHC: 31.8 g/dL (ref 30.0–36.0)
MCV: 96 fL (ref 78.0–100.0)
PLATELETS: 218 10*3/uL (ref 150–400)
RBC: 4.75 MIL/uL (ref 4.22–5.81)
RDW: 15.2 % (ref 11.5–15.5)
WBC: 26.9 10*3/uL — ABNORMAL HIGH (ref 4.0–10.5)

## 2016-09-01 LAB — BASIC METABOLIC PANEL
Anion gap: 9 (ref 5–15)
BUN: 44 mg/dL — ABNORMAL HIGH (ref 6–20)
CALCIUM: 8.6 mg/dL — AB (ref 8.9–10.3)
CO2: 30 mmol/L (ref 22–32)
CREATININE: 0.78 mg/dL (ref 0.61–1.24)
Chloride: 111 mmol/L (ref 101–111)
GFR calc Af Amer: >60 mL/min (ref >60)
GFR calc non Af Amer: >60 mL/min (ref >60)
GLUCOSE: 149 mg/dL — AB (ref 65–99)
Potassium: 3.6 mmol/L (ref 3.5–5.1)
Sodium: 150 mmol/L — ABNORMAL HIGH (ref 135–145)

## 2016-09-01 LAB — GLUCOSE, CAPILLARY
GLUCOSE-CAPILLARY: 128 mg/dL — AB (ref 65–99)
GLUCOSE-CAPILLARY: 134 mg/dL — AB (ref 65–99)
GLUCOSE-CAPILLARY: 137 mg/dL — AB (ref 65–99)
GLUCOSE-CAPILLARY: 140 mg/dL — AB (ref 65–99)
GLUCOSE-CAPILLARY: 153 mg/dL — AB (ref 65–99)
GLUCOSE-CAPILLARY: 164 mg/dL — AB (ref 65–99)
GLUCOSE-CAPILLARY: 167 mg/dL — AB (ref 65–99)
GLUCOSE-CAPILLARY: 169 mg/dL — AB (ref 65–99)
GLUCOSE-CAPILLARY: 180 mg/dL — AB (ref 65–99)
GLUCOSE-CAPILLARY: 185 mg/dL — AB (ref 65–99)
GLUCOSE-CAPILLARY: 192 mg/dL — AB (ref 65–99)
GLUCOSE-CAPILLARY: 199 mg/dL — AB (ref 65–99)
Glucose-Capillary: 139 mg/dL — ABNORMAL HIGH (ref 65–99)
Glucose-Capillary: 145 mg/dL — ABNORMAL HIGH (ref 65–99)
Glucose-Capillary: 146 mg/dL — ABNORMAL HIGH (ref 65–99)
Glucose-Capillary: 147 mg/dL — ABNORMAL HIGH (ref 65–99)
Glucose-Capillary: 155 mg/dL — ABNORMAL HIGH (ref 65–99)
Glucose-Capillary: 155 mg/dL — ABNORMAL HIGH (ref 65–99)
Glucose-Capillary: 160 mg/dL — ABNORMAL HIGH (ref 65–99)
Glucose-Capillary: 176 mg/dL — ABNORMAL HIGH (ref 65–99)
Glucose-Capillary: 182 mg/dL — ABNORMAL HIGH (ref 65–99)
Glucose-Capillary: 194 mg/dL — ABNORMAL HIGH (ref 65–99)
Glucose-Capillary: 196 mg/dL — ABNORMAL HIGH (ref 65–99)
Glucose-Capillary: 214 mg/dL — ABNORMAL HIGH (ref 65–99)

## 2016-09-01 LAB — VAS US LOWER EXTREMITY ARTERIAL DUPLEX
LPOPPPSV: 44 cm/s
RIGHT POST TIB DIST SYS: -38 cm/s
RPOPDPSV: -38 cm/s
RPOPPPSV: 27 cm/s
RSFDPSV: -41 cm/s

## 2016-09-01 LAB — CK: Total CK: 208 U/L (ref 49–397)

## 2016-09-01 LAB — LACTIC ACID, PLASMA
LACTIC ACID, VENOUS: 1.3 mmol/L (ref 0.5–1.9)
LACTIC ACID, VENOUS: 2.4 mmol/L — AB (ref 0.5–1.9)

## 2016-09-01 LAB — PHOSPHORUS: Phosphorus: 3.8 mg/dL (ref 2.5–4.6)

## 2016-09-01 LAB — PROCALCITONIN: Procalcitonin: 0.15 ng/mL

## 2016-09-01 LAB — MAGNESIUM: MAGNESIUM: 2.8 mg/dL — AB (ref 1.7–2.4)

## 2016-09-01 MED ORDER — SODIUM PHOSPHATES 45 MMOLE/15ML IV SOLN
10.0000 mmol | Freq: Once | INTRAVENOUS | Status: DC
  Filled 2016-09-01: qty 3.33

## 2016-09-01 MED ORDER — MAGNESIUM SULFATE 50 % IJ SOLN
6.0000 g | Freq: Once | INTRAMUSCULAR | Status: DC
  Filled 2016-09-01: qty 12

## 2016-09-01 MED ORDER — VANCOMYCIN HCL IN DEXTROSE 1-5 GM/200ML-% IV SOLN
1000.0000 mg | Freq: Three times a day (TID) | INTRAVENOUS | Status: DC
  Administered 2016-09-01 – 2016-09-03 (×6): 1000 mg via INTRAVENOUS
  Filled 2016-09-01 (×8): qty 200

## 2016-09-01 MED ORDER — FREE WATER
250.0000 mL | Freq: Three times a day (TID) | Status: DC
  Administered 2016-09-01 – 2016-09-02 (×3): 250 mL

## 2016-09-01 MED ORDER — POTASSIUM CHLORIDE 20 MEQ/15ML (10%) PO SOLN
20.0000 meq | ORAL | Status: AC
  Administered 2016-09-01 (×2): 20 meq
  Filled 2016-09-01 (×2): qty 15

## 2016-09-01 MED ORDER — IBUPROFEN 100 MG/5ML PO SUSP
600.0000 mg | Freq: Four times a day (QID) | ORAL | Status: DC | PRN
  Administered 2016-09-01 (×2): 600 mg
  Filled 2016-09-01 (×3): qty 30

## 2016-09-01 MED ORDER — PIPERACILLIN-TAZOBACTAM 3.375 G IVPB
3.3750 g | Freq: Three times a day (TID) | INTRAVENOUS | Status: AC
  Administered 2016-09-01 – 2016-09-05 (×14): 3.375 g via INTRAVENOUS
  Filled 2016-09-01 (×14): qty 50

## 2016-09-01 NOTE — Progress Notes (Addendum)
Inpatient Diabetes Program Recommendations  AACE/ADA: New Consensus Statement on Inpatient Glycemic Control (2015)  Target Ranges:  Prepandial:   less than 140 mg/dL      Peak postprandial:   less than 180 mg/dL (1-2 hours)      Critically ill patients:  140 - 180 mg/dL   Results for Bradley [name], Bradley [name] (MRN [id number]) as of 09/01/2016 08:24  Ref. Range 09/01/2016 00:31 09/01/2016 01:35 09/01/2016 02:40 09/01/2016 04:00 09/01/2016 05:04 09/01/2016 06:09  Glucose-Capillary Latest Ref Range: 65 - 99 mg/dL 409176 (H) 811199 (H) 914180 (H) 155 (H) 169 (H) 214 (H)     Current Insulin Orders: IV Insulin drip per ICU Glycemic Control Protocol (Phase 2)      -Note patient started on IV Insulin drip yesterday at 3pm per ICU Glycemic Control Protocol.  -Not ready to transition to SQ insulin yet per Protocol (insulin drip rates still >4 units/hr and has not had 6 subsequent CBGs <180 mg/dl).      --Will follow patient during hospitalization--  Ambrose FinlandJeannine Johnston Jatara Huettner RN, MSN, CDE Diabetes Coordinator Inpatient Glycemic Control Team Team Pager: [phone] (8a-5p)

## 2016-09-01 NOTE — Care Management Note (Addendum)
Case Management Note  Patient Details  Name: Bradley [name] MRN: [id number] Date of Birth: [birth year]  Subjective/Objective:     Pt admitted with resp distress                Action/Plan:  Pt is from home with wife.  Pt remains on ventilator, now with cold and mottling to LE.  CM will continue to follow for discharge needs   Expected Discharge Date:                  Expected Discharge Plan:     In-House Referral:     Discharge planning Services  CM Consult  Post Acute Care Choice:    Choice offered to:     DME Arranged:    DME Agency:     HH Arranged:    HH Agency:     Status of Service:  In process, will continue to follow  If discussed at Long Length of Stay Meetings, dates discussed:    Additional Comments: 09/01/2016 Pt is still not waking up - MRI negative.  Neuro and vascular consulting [name], Tibor Lemmons S, RN 09/01/2016, 11:42 AM

## 2016-09-01 NOTE — Progress Notes (Addendum)
VASCULAR LAB PRELIMINARY  ARTERIAL  ABI completed:    RIGHT    LEFT    PRESSURE WAVEFORM  PRESSURE WAVEFORM  BRACHIAL 108 Triphasic BRACHIAL IV Triphasic  DP 125 Monophasic DP 136 Triphasic  AT   AT    PT 129 Triphasic PT 133 Triphasic  PER   PER    GREAT TOE 78 Significantly dampened when compared to left great toe waveform GREAT TOE 114 Present    RIGHT LEFT  ABI 1.19 1.26  TBI 0.72 1.06    Bilateral ABIs are within normal limits at rest. Bilateral TBIs are within normal limits at rest, however the right great toe waveform is significantly dampened when compared to the left great toe waveform. Etiology unknown.   Limited right lower extremity arterial duplex has been completed. There is no obvious evidence of popliteal aneurysm.   09/01/2016 4:06 PM  Bradley [name], BS, RVT, RDCS, RDMS

## 2016-09-01 NOTE — Progress Notes (Signed)
Pharmacy Antibiotic Note  Bradley [name] is a 55 y.o. male admitted on 08/17/2016 with pneumonia.  Pharmacy has been consulted for vancomycin and zosyn dosing. Finished 10 days Zosyn (10/30) for PNA and cultures showed no growth. Plan per CCM for broad-spectrum antibiotics.  Patient became febrile over night (max temp 102.1)  and WBC have increased to  26.9.   Plan: Vancomycin 1 g IV every 8 hours.  Goal trough 15-20 mcg/mL. Zosyn 3.375g IV q8h (4 hour infusion).  Monitor renal function and culture data Monitor clinical course and narrow antimicrobials as appropriate  Height: 5\' 9"  (175.3 cm) Weight:  (scale nonfunctional at this time) IBW/kg (Calculated) : 70.7  Temp (24hrs), Avg:100.5 F (38.1 C), Min:99.4 F (37.4 C), Max:102.1 F (38.9 C)   Recent Labs Lab 08/28/16 0624 08/28/16 1556 08/29/16 0327 08/30/16 0230 08/31/16 0658 09/01/16 0403  WBC 24.7*  --  25.1* 20.5* 21.2* 26.9*  CREATININE 0.88 0.92 0.82 0.91 0.72 0.78    Estimated Creatinine Clearance: 119.4 mL/min (by C-G formula based on SCr of 0.78 mg/dL).    Allergies  Allergen Reactions  . Wellbutrin [Bupropion] Cough    Antimicrobials this admission: Azith 10/20>>10/21 CTX 10/20>>10/21 Vanc 10/21 >>10/26. 11/3>> Zosyn 10/21 >> 10/27. 10/28>>10/30. 11/3>>  Dose adjustments this admission: 10/23 VT: 19 > no dose adjustment indicated 10/25: SCr increased significantly, decreased vanco frequency to q12h  Microbiology results: 10/20 Respiratory panel PCR: neg 10/20 BCx: NG 10/22 Trach Aspirate: NG  10/20 MRSA PCR: neg 10/22 Resp panel PCR: neg 11/3 Resp Cx: pending   Bradley KeasRachel D Pearlee [name] 09/01/2016 9:08 AM

## 2016-09-01 NOTE — Progress Notes (Signed)
PULMONARY / CRITICAL CARE MEDICINE   Name: Bradley [name] MRN: [id number] DOB: September 09, 1961    ADMISSION DATE:  08/17/2016 CONSULTATION DATE:  08/19/16  REFERRING MD:  Dr Thayer Ohm Rama  CHIEF COMPLAINT:  Acute resp failure with hypoxemia  BRIEF 55 year old smoker, with PFT nov 2016 showing isolated reduction in dlco to 59% and quite functional and not on home o2. Followed by Dr Sherene Sires -0 last seen Sept/Nov 2016 based on CT chest evidence showing diffuse changes of centrilobular and paraseptal emphysema along with prominent interstitial markings primarily in the upper lobes consistent with pulmonary fibrosis. This was a non--UIP pattern. Patient recommended quitting smoking because of concerns of DIP. According to the patient and his wife, has continued to smoke. He has been in his baseline status of health and very functional. Then on 08/14/2016 he had a chest x-ray preoperative that only showed chronic changes without change for a right shoulder surgery rotator cuff tear. The procedure was uneventful. Postoperative course was uneventful. There was no vomiting. Then abruptly on 08/16/2016 he started getting acutely ill with shortness of breath and cough with brown colored sputum. Then admitted to the hospital. Initial white count was 13,000. BNP was normal. CT angiogram chest ruled out pulmonary embolism but showed diffuse ground glass opacities and enlarged mediastinal node measuring close to 2 cm. He was started on community acquired pneumonia antibiotics but has developed progressive acute respiratory failure necessitating Solu-Medrol since 08/18/2016 evening. And from the early hours of 08/19/2016 he's been on BiPAP with significant respiratory distress and therefore pulmonary critical care has been consulted.  At baseline he denies any mold exposure or mildew exposure or working with dust. He denies any birds in the house. He continues to smoke.   Events: 08/17/2016 - admit 08/18/2016-respiratory  virus panel, HIV and flu panel PCR negative 08/19/16 -bipap, move to icu -> intubated 10/21 10/22 repeat RVP from trach aspirate - negative 10/23 levo off 10/24 nimbex off 10/25 --> RR adjusted from 35 to 30 overnight due to respiratory acidosis. Did not improve gas, so RR decreased to 30 again. P/F ratios of 275 and 171, respectively.  10/26 --> ABGs improved on pressure control vent settings. FiO2 increased from 40 to 60 overnight due to desaturations.  10/29 - beginning to wean 10/30 - Fever to 100.8 F yesterday 10/31 - Fever to 100.9 F overnight. Not tolerating wean of sedation without becoming very asynchronous with vent. On 20 of PC above PEEP.  11/1- Fever to 101.9 this morning. Very obtunded. MRI brain obtained and negative.  11/2- OG tube out of place this morning. Off all sedating medications. EEG performed which showed moderate generalized slowing of brain activity.    SUBJECTIVE/OVERNIGHT/INTERVAL HX Febrile overnight to 102.1. Still very obtunded without spontaneous movements.   VITAL SIGNS: BP 120/88   Pulse (!) 102   Temp (!) 101.9 F (38.8 C) (Rectal)   Resp (!) 32   Ht 5\' 9"  (1.753 m)   Wt 212 lb (96.2 kg)   SpO2 100%   BMI 31.31 kg/m   HEMODYNAMICS:    VENTILATOR SETTINGS: Vent Mode: PCV FiO2 (%):  [40 %] 40 % Set Rate:  [16 bmp] 16 bmp Vt Set:  [440 mL] 440 mL PEEP:  [5 cmH20] 5 cmH20 Pressure Support:  [10 cmH20] 10 cmH20 Plateau Pressure:  [18 cmH20-22 cmH20] 19 cmH20  INTAKE / OUTPUT: I/O last 3 completed shifts: In: 2962.4 [I.V.:158.6; Other:20; NG/GT:2633.8; IV Piggyback:150] Out: 3035 [Urine:3035]    Lines:  Foley 10/21 >> ETT and OG 10/21 >> CVC 10/22 >> PIV x 3 A-line left radial 10/22 >>>10/30  PHYSICAL EXAMINATION: General:  Middle-aged, sedated, ETT in place Neuro: Sedated, no response to sternal rub or voice  HEENT:  ETT in place Cardiovascular:  Regular rate and rhythm Lungs: Decreased BS at bases Abdomen:  Soft  nontender Musculoskeletal:  No cyanosis no clubbing no edema. Looks euvolemic; clean dry bandage over R shoulder Skin:  Intact on exposed areas. R LE cool compared to L LE but with good cap refill and palpable pedal pulses.   LABS:  PULMONARY  Recent Labs Lab 08/26/16 1520 08/27/16 0330 08/28/16 0352 08/28/16 1000 08/29/16 0300  PHART 7.49* 7.464* 7.478* 7.446 7.454*  PCO2ART 47.1 48.3* 44.9 49.2* 47.4  PO2ART 89.1 81.2* 79.3* 89.0 82.4*  HCO3 35.5* 34.2* 32.9* 33.4* 32.8*  O2SAT 96.5 95.5 95.6 96.6 95.8   CBC  Recent Labs Lab 08/30/16 0230 08/31/16 0658 09/01/16 0403  HGB 13.5 14.0 14.5  HCT 42.7 44.2 45.6  WBC 20.5* 21.2* 26.9*  PLT 155 169 218   COAGULATION No results for input(s): INR in the last 168 hours. CARDIAC   No results for input(s): TROPONINI in the last 168 hours. No results for input(s): PROBNP in the last 168 hours.  CHEMISTRY  Recent Labs Lab 08/27/16 0500 08/28/16 0624 08/28/16 1556 08/29/16 0327 08/30/16 0230 08/31/16 0658 09/01/16 0403  NA 149* 148* 146* 146* 147* 150* 150*  K 4.7 4.0 4.0 4.3 4.2 3.7 3.6  CL 108 109 108 107 105 109 111  CO2 34* 32 31 33* 34* 34* 30  GLUCOSE 168* 257* 253* 273* 200* 193* 149*  BUN 42* 41* 45* 41* 41* 38* 44*  CREATININE 0.87 0.88 0.92 0.82 0.91 0.72 0.78  CALCIUM 8.8* 8.2* 8.3* 8.2* 8.2* 8.7* 8.6*  MG 2.7* 2.6*  --  2.5* 2.4  --  2.8*  PHOS 3.7 3.4  --  3.7 3.8  --  3.8   Estimated Creatinine Clearance: 119.4 mL/min (by C-G formula based on SCr of 0.78 mg/dL).  LIVER No results for input(s): AST, ALT, ALKPHOS, BILITOT, PROT, ALBUMIN, INR in the last 168 hours. INFECTIOUS No results for input(s): LATICACIDVEN, PROCALCITON in the last 168 hours. ENDOCRINE CBG (last 3)   Recent Labs  09/01/16 0240 09/01/16 0400 09/01/16 0504  GLUCAP 180* 155* 169*   IMAGING x48h  - image(s) personally visualized  -   highlighted in bold Mr Laqueta JeanBrain W Wo Contrast  Result Date: 08/30/2016 CLINICAL DATA:   Altered mental status EXAM: MRI HEAD WITHOUT AND WITH CONTRAST TECHNIQUE: Multiplanar, multiecho pulse sequences of the brain and surrounding structures were obtained without and with intravenous contrast. CONTRAST:  20mL MULTIHANCE GADOBENATE DIMEGLUMINE 529 MG/ML IV SOLN COMPARISON:  Head CT 08/18/2016 FINDINGS: Brain: No acute infarct or intraparenchymal hemorrhage. The midline structures are normal. No focal parenchymal signal abnormality. Apparent low ADC values are favored to be due to technical factors, as the distribution follows the corticospinal tracts rather than an expected pathologic pattern. No mass lesion or midline shift. No hydrocephalus or extra-axial fluid collection. Vascular: Major intracranial arterial and venous sinus flow voids are preserved. No evidence of chronic microhemorrhage or amyloid angiopathy. Skull and upper cervical spine: The visualized skull base, calvarium, upper cervical spine and extracranial soft tissues are normal. Sinuses/Orbits: Small amount of bilateral mastoid fluid. Normal orbits. IMPRESSION: Normal brain MRI for age. Electronically Signed   By: Deatra RobinsonKevin  Herman M.D.   On: 08/30/2016 15:35  Dg Abd Portable 1v  Result Date: 08/31/2016 CLINICAL DATA:  Recheck orogastric tube position EXAM: PORTABLE ABDOMEN - 1 VIEW COMPARISON:  KUB of May 14, 2015 FINDINGS: There is an orogastric tube whose tip lies in the region of the pylorus. There is a moderate amount of gas within the stomach. IMPRESSION: The tip and proximal port of the orogastric tube lie in the region of the pylorus of the stomach. Electronically Signed   By: Detavious  [place]il[place] M.D.   On: 08/31/2016 08:31    Summary:  55-y/o male who developed acute respiratory distress after shoulder surgery likely 2/2 ILD but treated for possible infectious etiology. Moving towards extubation, but mental status has been a barrier.   ASSESSMENT / PLAN:  PULMONARY A: #Baseline - Chronic smoker - September 2016 with  isolated reduction in diffusion capacity associated with emphysema and non-UIP pattern of interstitial lung disease - not otherwise specified. No known ILD exposures per hx  #Admit 08/17/2016 - Abrupt respiratory illness with acute hypoxemic respiratory failure with significant deterioration 3 days after right shoulder surgery. Pulmonary embolism ruled out. Diffuse ground glass opacities on CT chest at admission  - Differential diagnosis includes: Undiagnosed IPF flare, acute lung injury postop idiopathic, Boop, acute eosinophilic pneumonia and viral induced ARDS (less likely as repeat negative)  #Current 08/30/2016  - improving ARDS, off nimbex 10/24. On 40% fio2, peep 10.  - Trach cx with normal respiratory flora - BAL with lavage 10/24 unremarkable - Autoimmune and vasculitis labs - negative except for RF mildly elevated at 17.6 - CXR improved aeration . Remains in neg I/o bal , Pco2 trend down   P:   Continue steroids until extubated, currently 40 mg daily  Ipratroprium and xopenex q6h. SBT planned Need improve neurostatus Plan for trach 11/6 if unable to extubate    CARDIOVASCULAR A:  MI ruled out but troponin increased to 0.07. Suspect demand. ECHO ok 10/22 - started on levophed, discontinued 10/23 Net negative 2L  P:  Monitor Even balance goals to neg    RENAL A:   At risk for lactic acidosis and renal injury S/p diamox  Free water deficit 2.5 L AKI resolved P:   Free water to 250 BID  Held diuresis x2 days, given again 10/31 Replace electrolytes as indicated. Follow BMET.   GASTROINTESTINAL A:   At risk for aspiration with intubation P:   NPO except meds TFs per nutrition  HEMATOLOGIC A:   At risk for anemia of critical illness Leukocytosis with likely contribution from demargination from steroid use however worsening to 26.9 in setting of fevers  P:  Monitor - stable. DVT prophylaxis - lovenox  INFECTIOUS A:   Unclear if this is community acquired  pneumonia- treated Fever curve worsening  P:   Follow fever curve WBC Tylenol for fevers  Abx: Zosyn 10/21>>>10/30 Vanc 10/21>>>10/26  ENDOCRINE A:   At risk for hyperglycemia  - CBGs now well controlled  P:   Changed to Insulin gtt for persistent hyperglycemia  Solumedrol 40 daily (day 13), reduced 11/2  NEUROLOGIC A:   At risk for delirium Unresponsive on minimal sedation 10/25 -- suspect 2/2 hypercapnia. Head CT 10/25 without any acute changes Current, 11/3: Obtunded Encephalopathy with negative MRI  EEG showed moderate generalized slowing of brain activity, non-specific finding  P:   Monitor RASS goal: -1 Discontinued Fentanyl  Discontinued Diprivan  Discontinued Risperdal and haldol   FAMILY  - Updates: Wife not at bedside   Marcy Sirenatherine Manan Olmo, D.O. 09/01/2016,  6:11 AM PGY-2, [facility]

## 2016-09-01 NOTE — Consult Note (Signed)
NEURO HOSPITALIST CONSULT NOTE   Requestig physician: Dr. Marchelle Gearingamaswamy   Reason for Consult: persistent ams   History obtained from:  Chart  HPI:                                                                                                                                          Bradley [name] is an 55 y.o. male with hx as below presented with SOB, found to be in ARDS, had to intubated, sedated and paralyzed for several days. Sedation has been stopped for 2 days now and he has not been waking up. Did spike a fever last night up to 101.9 and has had an elevated white count of 20+ now for several days.   Past Medical History:  Diagnosis Date  . Anxiety    Citalopram controls "panic attacks"  . Asthma   . COPD (chronic obstructive pulmonary disease) (HCC)   . Dyspnea    with exertion  . History of kidney stones    x3-4 - lithotripsy  . Pneumonia    08/11/16- 3 years ago  . Pulmonary fibrosis (HCC)     Past Surgical History:  Procedure Laterality Date  . COLONOSCOPY W/ POLYPECTOMY    . LITHOTRIPSY    . MULTIPLE TOOTH EXTRACTIONS     19 teeth extracted at once  . SHOULDER ARTHROSCOPY WITH ROTATOR CUFF REPAIR Right 08/14/2016   Procedure: RIGHT SHOULDER ARTHROSCOPY,DEBRIDEMENT, OPEN ROTATOR CUFF REPAIR;  Surgeon: Eldred MangesMark C Yates, MD;  Location: [facility] OR;  Service: Orthopedics;  Laterality: Right;  . TONSILLECTOMY      Family History  Problem Relation Age of Onset  . Heart disease Mother   . Colon cancer Mother      Social History:  reports that he has been smoking Cigarettes.  He has a 30.00 pack-year smoking history. He has never used smokeless tobacco. He reports that he does not drink alcohol or use drugs.  Allergies  Allergen Reactions  . Wellbutrin [Bupropion] Cough    MEDICATIONS:                                                                                                                     I have reviewed the patient's current  medications.   ROS:  History obtained from chart review  General ROS: negative for - chills, fatigue, fever, night sweats, weight gain or weight loss Psychological ROS: negative for - behavioral disorder, hallucinations, memory difficulties, mood swings or suicidal ideation Ophthalmic ROS: negative for - blurry vision, double vision, eye pain or loss of vision ENT ROS: negative for - epistaxis, nasal discharge, oral lesions, sore throat, tinnitus or vertigo Allergy and Immunology ROS: negative for - hives or itchy/watery eyes Hematological and Lymphatic ROS: negative for - bleeding problems, bruising or swollen lymph nodes Endocrine ROS: negative for - galactorrhea, hair pattern changes, polydipsia/polyuria or temperature intolerance Respiratory ROS: negative for - cough, hemoptysis, shortness of breath or wheezing Cardiovascular ROS: negative for - chest pain, dyspnea on exertion, edema or irregular heartbeat Gastrointestinal ROS: negative for - abdominal pain, diarrhea, hematemesis, nausea/vomiting or stool incontinence Genito-Urinary ROS: negative for - dysuria, hematuria, incontinence or urinary frequency/urgency Musculoskeletal ROS: negative for - joint swelling or muscular weakness Neurological ROS: as noted in HPI Dermatological ROS: negative for rash and skin lesion changes   Blood pressure 111/80, pulse (!) 108, temperature (!) 101.8 F (38.8 C), temperature source Oral, resp. rate (!) 31, height 5\' 9"  (1.753 m), weight 96.2 kg (212 lb), SpO2 98 %.   Neurologic Examination:                                                                                                      HEENT-  Normocephalic, no lesions, without obvious abnormality.  Normal external eye and conjunctiva.  Normal TM's bilaterally.  Normal auditory canals and external ears. Normal  external nose, mucus membranes and septum.  Normal pharynx. Cardiovascular- regular rate and rhythm, S1, S2 normal, no murmur, click, rub or gallop, pulses palpable throughout   Lungs- chest clear, no wheezing, rales, normal symmetric air entry, Heart exam - S1, S2 normal, no murmur, no gallop, rate regular Abdomen- soft, non-tender; bowel sounds normal; no masses,  no organomegaly   Neurological Examination Mental Status: Grimaces to pain Does not open eyes to voice or pain All brainstem reflexes intact Withdraws in all extremities  Appears to be more proximally weak throughout  Pupils are equal and reacitve      Lab Results: Basic Metabolic Panel:  Recent Labs Lab 08/27/16 0500 08/28/16 0624 08/28/16 1556 08/29/16 0327 08/30/16 0230 08/31/16 0658 09/01/16 0403  NA 149* 148* 146* 146* 147* 150* 150*  K 4.7 4.0 4.0 4.3 4.2 3.7 3.6  CL 108 109 108 107 105 109 111  CO2 34* 32 31 33* 34* 34* 30  GLUCOSE 168* 257* 253* 273* 200* 193* 149*  BUN 42* 41* 45* 41* 41* 38* 44*  CREATININE 0.87 0.88 0.92 0.82 0.91 0.72 0.78  CALCIUM 8.8* 8.2* 8.3* 8.2* 8.2* 8.7* 8.6*  MG 2.7* 2.6*  --  2.5* 2.4  --  2.8*  PHOS 3.7 3.4  --  3.7 3.8  --  3.8    Liver Function Tests:  Recent Labs Lab 09/01/16 0929  AST 32  ALT 62  ALKPHOS 31*  BILITOT 0.7  PROT 5.3*  ALBUMIN  2.3*   No results for input(s): LIPASE, AMYLASE in the last 168 hours. No results for input(s): AMMONIA in the last 168 hours.  CBC:  Recent Labs Lab 08/28/16 0624 08/29/16 0327 08/30/16 0230 08/31/16 0658 09/01/16 0403  WBC 24.7* 25.1* 20.5* 21.2* 26.9*  HGB 14.3 13.7 13.5 14.0 14.5  HCT 45.2 44.2 42.7 44.2 45.6  MCV 96.4 97.6 94.9 95.3 96.0  PLT 156 149* 155 169 218    Cardiac Enzymes:  Recent Labs Lab 09/01/16 0929  CKTOTAL 208    Lipid Panel:  Recent Labs Lab 08/27/16 0500 08/30/16 0230  TRIG 166* 180*    CBG:  Recent Labs Lab 09/01/16 0135 09/01/16 0240 09/01/16 0400  09/01/16 0504 09/01/16 0609  GLUCAP 199* 180* 155* 169* 214*    Microbiology: Results for orders placed or performed during the hospital encounter of 08/17/16  Culture, blood (routine x 2) Call MD if unable to obtain prior to antibiotics being given     Status: None   Collection Time: 08/18/16  1:58 AM  Result Value Ref Range Status   Specimen Description BLOOD LEFT HAND  Final   Special Requests BOTTLES DRAWN AEROBIC AND ANAEROBIC  Final   Culture NO GROWTH 5 DAYS  Final   Report Status 08/23/2016 FINAL  Final  Culture, blood (routine x 2) Call MD if unable to obtain prior to antibiotics being given     Status: None   Collection Time: 08/18/16  2:04 AM  Result Value Ref Range Status   Specimen Description BLOOD LEFT HAND  Final   Special Requests AEROBIC BOTTLE ONLY  Final   Culture NO GROWTH 5 DAYS  Final   Report Status 08/23/2016 FINAL  Final  MRSA PCR Screening     Status: None   Collection Time: 08/18/16  8:32 AM  Result Value Ref Range Status   MRSA by PCR NEGATIVE NEGATIVE Final    Comment:        The GeneXpert MRSA Assay (FDA approved for NASAL specimens only), is one component of a comprehensive MRSA colonization surveillance program. It is not intended to diagnose MRSA infection nor to guide or monitor treatment for MRSA infections.   Respiratory Panel by PCR     Status: None   Collection Time: 08/18/16  9:18 AM  Result Value Ref Range Status   Adenovirus NOT DETECTED NOT DETECTED Final   Coronavirus 229E NOT DETECTED NOT DETECTED Final   Coronavirus HKU1 NOT DETECTED NOT DETECTED Final   Coronavirus NL63 NOT DETECTED NOT DETECTED Final   Coronavirus OC43 NOT DETECTED NOT DETECTED Final   Metapneumovirus NOT DETECTED NOT DETECTED Final   Rhinovirus / Enterovirus NOT DETECTED NOT DETECTED Final   Influenza A NOT DETECTED NOT DETECTED Final   Influenza B NOT DETECTED NOT DETECTED Final   Parainfluenza Virus 1 NOT DETECTED NOT DETECTED Final    Parainfluenza Virus 2 NOT DETECTED NOT DETECTED Final   Parainfluenza Virus 3 NOT DETECTED NOT DETECTED Final   Parainfluenza Virus 4 NOT DETECTED NOT DETECTED Final   Respiratory Syncytial Virus NOT DETECTED NOT DETECTED Final   Bordetella pertussis NOT DETECTED NOT DETECTED Final   Chlamydophila pneumoniae NOT DETECTED NOT DETECTED Final   Mycoplasma pneumoniae NOT DETECTED NOT DETECTED Final  Culture, respiratory (NON-Expectorated)     Status: None   Collection Time: 08/20/16  2:30 AM  Result Value Ref Range Status   Specimen Description TRACHEAL ASPIRATE  Final   Special Requests Normal  Final  Gram Stain   Final    MODERATE WBC PRESENT, PREDOMINANTLY PMN NO ORGANISMS SEEN    Culture Consistent with normal respiratory flora.  Final   Report Status 08/22/2016 FINAL  Final  Respiratory Panel by PCR     Status: None   Collection Time: 08/20/16 10:30 AM  Result Value Ref Range Status   Adenovirus NOT DETECTED NOT DETECTED Final   Coronavirus 229E NOT DETECTED NOT DETECTED Final   Coronavirus HKU1 NOT DETECTED NOT DETECTED Final   Coronavirus NL63 NOT DETECTED NOT DETECTED Final   Coronavirus OC43 NOT DETECTED NOT DETECTED Final   Metapneumovirus NOT DETECTED NOT DETECTED Final   Rhinovirus / Enterovirus NOT DETECTED NOT DETECTED Final   Influenza A NOT DETECTED NOT DETECTED Final   Influenza B NOT DETECTED NOT DETECTED Final   Parainfluenza Virus 1 NOT DETECTED NOT DETECTED Final   Parainfluenza Virus 2 NOT DETECTED NOT DETECTED Final   Parainfluenza Virus 3 NOT DETECTED NOT DETECTED Final   Parainfluenza Virus 4 NOT DETECTED NOT DETECTED Final   Respiratory Syncytial Virus NOT DETECTED NOT DETECTED Final   Bordetella pertussis NOT DETECTED NOT DETECTED Final   Chlamydophila pneumoniae NOT DETECTED NOT DETECTED Final   Mycoplasma pneumoniae NOT DETECTED NOT DETECTED Final    Coagulation Studies: No results for input(s): LABPROT, INR in the last 72 hours.  Imaging: Mr  Laqueta Jean ZO Contrast  Result Date: 08/30/2016 CLINICAL DATA:  Altered mental status EXAM: MRI HEAD WITHOUT AND WITH CONTRAST TECHNIQUE: Multiplanar, multiecho pulse sequences of the brain and surrounding structures were obtained without and with intravenous contrast. CONTRAST:  20mL MULTIHANCE GADOBENATE DIMEGLUMINE 529 MG/ML IV SOLN COMPARISON:  Head CT 08/18/2016 FINDINGS: Brain: No acute infarct or intraparenchymal hemorrhage. The midline structures are normal. No focal parenchymal signal abnormality. Apparent low ADC values are favored to be due to technical factors, as the distribution follows the corticospinal tracts rather than an expected pathologic pattern. No mass lesion or midline shift. No hydrocephalus or extra-axial fluid collection. Vascular: Major intracranial arterial and venous sinus flow voids are preserved. No evidence of chronic microhemorrhage or amyloid angiopathy. Skull and upper cervical spine: The visualized skull base, calvarium, upper cervical spine and extracranial soft tissues are normal. Sinuses/Orbits: Small amount of bilateral mastoid fluid. Normal orbits. IMPRESSION: Normal brain MRI for age. Electronically Signed   By: Deatra Robinson M.D.   On: 08/30/2016 15:35   Dg Chest Port 1 View  Result Date: 09/01/2016 CLINICAL DATA:  Intubation. EXAM: PORTABLE CHEST 1 VIEW COMPARISON:  08/28/2016. FINDINGS: Endotracheal tube, NG tube, right IJ line in stable position. Heart size normal. Low lung volumes. Mild bibasilar infiltrates have improved from prior exam. Right costophrenic angle not completely imaged. No pleural effusion noted on today's exam. No pneumothorax . IMPRESSION: 1. Lines and tubes in stable position. 2. Low lung volumes Mild bibasilar infiltrates, improved from prior exam. No pleural effusion noted on today's exam. Electronically Signed   By: Maisie Fus  Register   On: 09/01/2016 07:18   Dg Abd Portable 1v  Result Date: 08/31/2016 CLINICAL DATA:  Recheck orogastric tube  position EXAM: PORTABLE ABDOMEN - 1 VIEW COMPARISON:  KUB of May 14, 2015 FINDINGS: There is an orogastric tube whose tip lies in the region of the pylorus. There is a moderate amount of gas within the stomach. IMPRESSION: The tip and proximal port of the orogastric tube lie in the region of the pylorus of the stomach. Electronically Signed   By: Sirr  [place] M.D.  On: 08/31/2016 08:31        Assessment/Plan:  55 y.o. male with hx as below presented with SOB, found to be in ARDS, had to intubated, sedated and paralyzed for several days. Sedation has been stopped for 2 days now and he has not been waking up. Did spike a fever last night up to 101.9 and has had an elevated white count of 20+ now for several days  Base on inpatient history, examination, MRI brain and EEG findings  This is most likely consistent with toxic/metabolic encephalopathy  - Fever - Elevated white count - Hypernatremia - Prolonged sedation, use of steriods and paralytics can lead to critical illness myopathy and neuropathy, may need EMG in the future - Sedation overlag as this was stopped only 2 days aho  I believe this is multifactorial from all the above

## 2016-09-01 NOTE — Progress Notes (Signed)
Sputum sample obtained and sent to lab by RT. 

## 2016-09-01 NOTE — Consult Note (Signed)
[facility] [place] CONSULT NOTE  VASCULAR SURGERY ASSESSMENT AND PLAN:  We were asked to see the patient as he developed a cold right foot. The foot is cold, however, he has a palpable femoral and popliteal pulse on the right with triphasic Doppler signals in the right posterior tibial and dorsalis pedis artery. He also has good color of the right foot. He has no obvious reason to have embolized although we will obtain a duplex of the right popliteal artery to rule out a popliteal artery aneurysm. We will follow his exam. No invasive workup otherwise at this time.  Waverly Ferrari, MD, FACS Beeper [phone] Office: [phone]  MRN : [id number]  Reason for Consult: Cold right LE Referring Physician: Dr. Earlene Plater  History of Present Illness: 55 y/o male that presented to the ED with SOB on 08/18/2016.  We are consulted today secondary to cold right LE.  He is intubated and without sedattion.  His wife is at the bedside.  She reports no past medical history of ulcers or claudication symptoms.  He does have a family history of his mother having CAD and CABG.  Other past medical history includes:  COPD, asthma, pulmonary fibrosis, depression, anxiety, kidney stone, and tobacco abuse.  His wife denise HTN, DM, and CAD.   Current Facility-Administered Medications  Medication Dose Route Frequency Provider Last Rate Last Dose  . acetaminophen (TYLENOL) solution 650 mg  650 mg Oral Q6H PRN Kalman Shan, MD   650 mg at 09/01/16 1126  . chlorhexidine gluconate (MEDLINE KIT) (PERIDEX) 0.12 % solution 15 mL  15 mL Mouth Rinse BID Kalman Shan, MD   15 mL at 09/01/16 0818  . enoxaparin (LOVENOX) injection 40 mg  40 mg Subcutaneous Q24H Lorretta Harp, MD   40 mg at 09/01/16 0915  . famotidine (PEPCID) IVPB 20 mg premix  20 mg Intravenous Q12H Praveen Mannam, MD   20 mg at 09/01/16 0915  . feeding supplement (VITAL AF 1.2 CAL) liquid 1,000 mL  1,000 mL Per Tube Continuous Alyson Reedy, MD 75 mL/hr at 09/01/16 0817 1,000 mL at 09/01/16 0817  . free water 250 mL  250 mL Per Tube Q8H Arvilla Market, DO      . ibuprofen (ADVIL,MOTRIN) 100 MG/5ML suspension 600 mg  600 mg Per Tube Q6H PRN Leslye Peer, MD   600 mg at 09/01/16 0532  . insulin regular (NOVOLIN R,HUMULIN R) 250 Units in sodium chloride 0.9 % 250 mL (1 Units/mL) infusion   Intravenous Continuous Arvilla Market, DO 10.6 mL/hr at 09/01/16 1117 10.6 Units/hr at 09/01/16 1117  . ipratropium (ATROVENT) nebulizer solution 0.5 mg  0.5 mg Nebulization Q6H Maryruth Bun Rama, MD   0.5 mg at 09/01/16 0746  . labetalol (NORMODYNE) tablet 100 mg  100 mg Oral TID Alyson Reedy, MD   100 mg at 09/01/16 0535  . levalbuterol (XOPENEX) nebulizer solution 1.25 mg  1.25 mg Nebulization Q6H Maryruth Bun Rama, MD   1.25 mg at 09/01/16 0746  . magnesium hydroxide (MILK OF MAGNESIA) suspension 30 mL  30 mL Per Tube Once Alyson Reedy, MD      . MEDLINE mouth rinse  15 mL Mouth Rinse QID Kalman Shan, MD   15 mL at 09/01/16 1200  . piperacillin-tazobactam (ZOSYN) IVPB 3.375 g  3.375 g Intravenous Q8H Marquita Palms, RPH   3.375 g at 09/01/16 0915  . polyethylene glycol (MIRALAX / GLYCOLAX) packet 17 g  17  g Oral Daily PRN Smiley Houseman, MD      . predniSONE (DELTASONE) tablet 40 mg  40 mg Per NG tube Q breakfast Mercy Riding, MD   40 mg at 09/01/16 0914  . vancomycin (VANCOCIN) IVPB 1000 mg/200 mL premix  1,000 mg Intravenous Q8H Rebecka Apley, RPH   1,000 mg at 09/01/16 0915    Pt meds include: Statin :No Betablocker: No ASA: No Other anticoagulants/antiplatelets: none  Past Medical History:  Diagnosis Date  . Anxiety    Citalopram controls "panic attacks"  . Asthma   . COPD (chronic obstructive pulmonary disease) ([place])   . Dyspnea    with exertion  . History of kidney stones    x3-4 - lithotripsy  . Pneumonia    08/11/16- 3 years ago  . Pulmonary fibrosis ([place])     Past Surgical History:   Procedure Laterality Date  . COLONOSCOPY W/ POLYPECTOMY    . LITHOTRIPSY    . MULTIPLE TOOTH EXTRACTIONS     19 teeth extracted at once  . SHOULDER ARTHROSCOPY WITH ROTATOR CUFF REPAIR Right 08/14/2016   Procedure: RIGHT SHOULDER ARTHROSCOPY,DEBRIDEMENT, OPEN ROTATOR CUFF REPAIR;  Surgeon: Marybelle Killings, MD;  Location: [facility];  Service: Orthopedics;  Laterality: Right;  . TONSILLECTOMY      Social History Social History  Substance Use Topics  . Smoking status: Current Every Day Smoker    Packs/day: 1.00    Years: 30.00    Types: Cigarettes  . Smokeless tobacco: Never Used  . Alcohol use No    Family History Family History  Problem Relation Age of Onset  . Heart disease Mother   . Colon cancer Mother     Allergies  Allergen Reactions  . Wellbutrin [Bupropion] Cough     REVIEW OF SYSTEMS Reviewed with his wife  General: '[ ]'$  Weight loss, '[ ]'$  Fever, '[ ]'$  chills Neurologic: '[ ]'$  Dizziness, '[ ]'$  Blackouts, '[ ]'$  Seizure '[ ]'$  Stroke, '[ ]'$  "Mini stroke", '[ ]'$  Slurred speech, '[ ]'$  Temporary blindness; '[ ]'$  weakness in arms or legs, '[ ]'$  Hoarseness '[ ]'$  Dysphagia Cardiac: '[ ]'$  Chest pain/pressure, [x ] Shortness of breath at rest [x ] Shortness of breath with exertion, '[ ]'$  Atrial fibrillation or irregular heartbeat  Vascular: '[ ]'$  Pain in legs with walking, '[ ]'$  Pain in legs at rest, '[ ]'$  Pain in legs at night,  '[ ]'$  Non-healing ulcer, '[ ]'$  Blood clot in vein/DVT,   Pulmonary: '[ ]'$  Home oxygen, '[ ]'$  Productive cough, '[ ]'$  Coughing up blood, [x ] Asthma,  '[ ]'$  Wheezing '[ ]'$  COPD Musculoskeletal:  '[ ]'$  Arthritis, '[ ]'$  Low back pain, [x ] Joint pain Hematologic: '[ ]'$  Easy Bruising, '[ ]'$  Anemia; '[ ]'$  Hepatitis Gastrointestinal: '[ ]'$  Blood in stool, '[ ]'$  Gastroesophageal Reflux/heartburn, Urinary: '[ ]'$  chronic Kidney disease, '[ ]'$  on HD - '[ ]'$  MWF or '[ ]'$  TTHS, '[ ]'$  Burning with urination, '[ ]'$  Difficulty urinating Skin: '[ ]'$  Rashes, '[ ]'$  Wounds Psychological: '[ ]'$  Anxiety, '[ ]'$  Depression  Physical  Examination Vitals:   09/01/16 0900 09/01/16 1000 09/01/16 1100 09/01/16 1119  BP: 103/73 103/78 111/80   Pulse:      Resp: (!) 27 (!) 27 (!) 31   Temp:    (!) 101.8 F (38.8 C)  TempSrc:    Oral  SpO2: 97% 100% 99%   Weight:      Height:       Body  mass index is 31.31 kg/m.  General:  Intubated without sedation non responsive  Pulmonary: intubated Cardiac: RRR, without  Murmurs, rubs or gallops; No carotid bruits Abdomen: soft, NT, no masses Skin: no rashes, ulcers noted;  no Gangrene , no cellulitis; no open wounds;   Vascular Exam/Pulses:Right Lower leg mid calf to toes cool to touch.  Palpable right femoral and  popliteal pulses.  Non palpable pedal pulses.  Doppler biphasic PT/DP/Peroneal signals.  Left LE palpable femoral, PT/DP pulses  Musculoskeletal: no muscle wasting or atrophy; no edema  Neurologic:not responsive SENSATION: unable to assess tested MOTOR FUNCTION: unable to assess tested  Significant Diagnostic Studies: CBC Lab Results  Component Value Date   WBC 26.9 (H) 09/01/2016   HGB 14.5 09/01/2016   HCT 45.6 09/01/2016   MCV 96.0 09/01/2016   PLT 218 09/01/2016    BMET    Component Value Date/Time   NA 150 (H) 09/01/2016 0403   K 3.6 09/01/2016 0403   CL 111 09/01/2016 0403   CO2 30 09/01/2016 0403   GLUCOSE 149 (H) 09/01/2016 0403   BUN 44 (H) 09/01/2016 0403   CREATININE 0.78 09/01/2016 0403   CALCIUM 8.6 (L) 09/01/2016 0403   GFRNONAA >60 09/01/2016 0403   GFRAA >60 09/01/2016 0403   Estimated Creatinine Clearance: 119.4 mL/min (by C-G formula based on SCr of 0.78 mg/dL).  COAG Lab Results  Component Value Date   INR 1.20 08/18/2016   INR 1.06 08/14/2016   Non-Invasive Vascular Imaging:  Pending arterial duplex to r/o popliteal aneurysm right LE  ASSESSMENT/PLAN:  Cool right lower leg Palpable right popliteal artery Biphasic signals via doppler right LE.  He is not at risk of limb loss.   No vascular intervention at this  time.  We will follow arterial duplex and make further recommendations as needed.   Laurence Slate [facility] 09/01/2016 11:33 AM

## 2016-09-02 ENCOUNTER — Inpatient Hospital Stay (HOSPITAL_COMMUNITY): Payer: BLUE CROSS/BLUE SHIELD

## 2016-09-02 LAB — GLUCOSE, CAPILLARY
GLUCOSE-CAPILLARY: 119 mg/dL — AB (ref 65–99)
GLUCOSE-CAPILLARY: 125 mg/dL — AB (ref 65–99)
GLUCOSE-CAPILLARY: 134 mg/dL — AB (ref 65–99)
GLUCOSE-CAPILLARY: 137 mg/dL — AB (ref 65–99)
GLUCOSE-CAPILLARY: 137 mg/dL — AB (ref 65–99)
GLUCOSE-CAPILLARY: 145 mg/dL — AB (ref 65–99)
GLUCOSE-CAPILLARY: 151 mg/dL — AB (ref 65–99)
GLUCOSE-CAPILLARY: 156 mg/dL — AB (ref 65–99)
GLUCOSE-CAPILLARY: 167 mg/dL — AB (ref 65–99)
GLUCOSE-CAPILLARY: 202 mg/dL — AB (ref 65–99)
Glucose-Capillary: 172 mg/dL — ABNORMAL HIGH (ref 65–99)
Glucose-Capillary: 200 mg/dL — ABNORMAL HIGH (ref 65–99)
Glucose-Capillary: 184 mg/dL — ABNORMAL HIGH (ref 65–99)
Glucose-Capillary: 203 mg/dL — ABNORMAL HIGH (ref 65–99)
Glucose-Capillary: 228 mg/dL — ABNORMAL HIGH (ref 65–99)
Glucose-Capillary: 126 mg/dL — ABNORMAL HIGH (ref 65–99)
Glucose-Capillary: 139 mg/dL — ABNORMAL HIGH (ref 65–99)
Glucose-Capillary: 177 mg/dL — ABNORMAL HIGH (ref 65–99)
Glucose-Capillary: 182 mg/dL — ABNORMAL HIGH (ref 65–99)
Glucose-Capillary: 186 mg/dL — ABNORMAL HIGH (ref 65–99)
Glucose-Capillary: 150 mg/dL — ABNORMAL HIGH (ref 65–99)
Glucose-Capillary: 138 mg/dL — ABNORMAL HIGH (ref 65–99)

## 2016-09-02 LAB — CBC
HCT: 44.3 % (ref 39.0–52.0)
HEMOGLOBIN: 14.1 g/dL (ref 13.0–17.0)
MCH: 30.5 pg (ref 26.0–34.0)
MCHC: 31.8 g/dL (ref 30.0–36.0)
MCV: 95.7 fL (ref 78.0–100.0)
PLATELETS: 211 10*3/uL (ref 150–400)
RBC: 4.63 MIL/uL (ref 4.22–5.81)
RDW: 15.5 % (ref 11.5–15.5)
WBC: 29.4 10*3/uL — ABNORMAL HIGH (ref 4.0–10.5)

## 2016-09-02 LAB — LACTIC ACID, PLASMA: LACTIC ACID, VENOUS: 1.1 mmol/L (ref 0.5–1.9)

## 2016-09-02 LAB — MAGNESIUM: Magnesium: 2.6 mg/dL — ABNORMAL HIGH (ref 1.7–2.4)

## 2016-09-02 LAB — BASIC METABOLIC PANEL
ANION GAP: 8 (ref 5–15)
BUN: 43 mg/dL — ABNORMAL HIGH (ref 6–20)
CALCIUM: 8.1 mg/dL — AB (ref 8.9–10.3)
CO2: 28 mmol/L (ref 22–32)
CREATININE: 0.76 mg/dL (ref 0.61–1.24)
Chloride: 114 mmol/L — ABNORMAL HIGH (ref 101–111)
GLUCOSE: 153 mg/dL — AB (ref 65–99)
Potassium: 3.5 mmol/L (ref 3.5–5.1)
Sodium: 150 mmol/L — ABNORMAL HIGH (ref 135–145)

## 2016-09-02 LAB — URINE CULTURE: CULTURE: NO GROWTH

## 2016-09-02 LAB — PHOSPHORUS: PHOSPHORUS: 4.3 mg/dL (ref 2.5–4.6)

## 2016-09-02 LAB — PROCALCITONIN: PROCALCITONIN: 0.24 ng/mL

## 2016-09-02 MED ORDER — ETOMIDATE 2 MG/ML IV SOLN
40.0000 mg | Freq: Once | INTRAVENOUS | Status: DC
  Filled 2016-09-02: qty 20

## 2016-09-02 MED ORDER — FREE WATER
300.0000 mL | Status: DC
  Administered 2016-09-02 – 2016-09-03 (×5): 300 mL

## 2016-09-02 MED ORDER — SODIUM CHLORIDE 0.45 % IV SOLN
INTRAVENOUS | Status: DC
  Administered 2016-09-02: 20 mL/h via INTRAVENOUS
  Administered 2016-09-05: 18:00:00 via INTRAVENOUS

## 2016-09-02 MED ORDER — VECURONIUM BROMIDE 10 MG IV SOLR: 10.0000 mg | Freq: Once | INTRAVENOUS | Status: DC

## 2016-09-02 MED ORDER — SODIUM CHLORIDE 0.9% FLUSH
10.0000 mL | INTRAVENOUS | Status: DC | PRN
Start: 2016-09-02 — End: 2016-09-09

## 2016-09-02 MED ORDER — MIDAZOLAM HCL 2 MG/2ML IJ SOLN
4.0000 mg | Freq: Once | INTRAMUSCULAR | Status: DC
Start: 2016-09-02 — End: 2016-09-08

## 2016-09-02 MED ORDER — PREDNISONE 20 MG PO TABS
30.0000 mg | ORAL_TABLET | Freq: Every day | ORAL | Status: DC
  Administered 2016-09-03 – 2016-09-04 (×2): 30 mg via NASOGASTRIC
  Filled 2016-09-02 (×2): qty 1

## 2016-09-02 MED ORDER — DEXTROSE 10 % IV SOLN: INTRAVENOUS | Status: DC | PRN

## 2016-09-02 MED ORDER — SODIUM CHLORIDE 0.9% FLUSH
10.0000 mL | Freq: Two times a day (BID) | INTRAVENOUS | Status: DC
  Administered 2016-09-02: 20 mL
  Administered 2016-09-02 – 2016-09-08 (×10): 10 mL

## 2016-09-02 MED ORDER — INSULIN ASPART 100 UNIT/ML ~~LOC~~ SOLN: 2.0000 [IU] | SUBCUTANEOUS | Status: DC

## 2016-09-02 MED ORDER — INSULIN GLARGINE 100 UNIT/ML ~~LOC~~ SOLN
15.0000 [IU] | Freq: Every day | SUBCUTANEOUS | Status: DC
  Administered 2016-09-02 – 2016-09-03 (×2): 15 [IU] via SUBCUTANEOUS
  Filled 2016-09-02 (×4): qty 0.15

## 2016-09-02 MED ORDER — FENTANYL CITRATE (PF) 100 MCG/2ML IJ SOLN
25.0000 ug | INTRAMUSCULAR | Status: DC | PRN
  Administered 2016-09-02 – 2016-09-03 (×7): 100 ug via INTRAVENOUS
  Administered 2016-09-07: 50 ug via INTRAVENOUS
  Filled 2016-09-02 (×9): qty 2

## 2016-09-02 MED ORDER — PROPOFOL 500 MG/50ML IV EMUL
5.0000 ug/kg/min | Freq: Once | INTRAVENOUS | Status: DC
  Filled 2016-09-02: qty 50

## 2016-09-02 MED ORDER — FENTANYL CITRATE (PF) 100 MCG/2ML IJ SOLN: 200.0000 ug | Freq: Once | INTRAMUSCULAR | Status: DC

## 2016-09-02 NOTE — Progress Notes (Signed)
Peripherally Inserted Central Catheter/Midline Placement  The IV Nurse has discussed with the patient and/or persons authorized to consent for the patient, the purpose of this procedure and the potential benefits and risks involved with this procedure.  The benefits include less needle sticks, lab draws from the catheter, and the patient may be discharged home with the catheter. Risks include, but not limited to, infection, bleeding, blood clot (thrombus formation), and puncture of an artery; nerve damage and irregular heartbeat and possibility to perform a PICC exchange if needed/ordered by physician.  Alternatives to this procedure were also discussed.  B[employer] Power PICC patient education guide, fact sheet on infection prevention and patient information card has been provided to patient /or left at bedside.    PICC/Midline Placement Documentation    Consent signed by wife at bedside    Bradley GreenlandLumban, Bradley [name] 09/02/2016, 9:18 AM

## 2016-09-02 NOTE — Progress Notes (Signed)
PULMONARY / CRITICAL CARE MEDICINE   Name: Bradley [name] MRN: [id number] DOB: 10-28-1961    ADMISSION DATE:  08/17/2016 CONSULTATION DATE:  08/19/16  REFERRING MD:  Dr Thayer Ohm Rama  CHIEF COMPLAINT:  Acute resp failure with hypoxemia  BRIEF 55 year old smoker, with PFT nov 2016 showing isolated reduction in dlco to 59% and quite functional and not on home o2. Followed by Dr Sherene Sires -0 last seen Sept/Nov 2016 based on CT chest evidence showing diffuse changes of centrilobular and paraseptal emphysema along with prominent interstitial markings primarily in the upper lobes consistent with pulmonary fibrosis. This was a non--UIP pattern. Patient recommended quitting smoking because of concerns of DIP. According to the patient and his wife, has continued to smoke. He has been in his baseline status of health and very functional. Then on 08/14/2016 he had a chest x-ray preoperative that only showed chronic changes without change for a right shoulder surgery rotator cuff tear. The procedure was uneventful. Postoperative course was uneventful. There was no vomiting. Then abruptly on 08/16/2016 he started getting acutely ill with shortness of breath and cough with brown colored sputum. Then admitted to the hospital. Initial white count was 13,000. BNP was normal. CT angiogram chest ruled out pulmonary embolism but showed diffuse ground glass opacities and enlarged mediastinal node measuring close to 2 cm. He was started on community acquired pneumonia antibiotics but has developed progressive acute respiratory failure necessitating Solu-Medrol since 08/18/2016 evening. And from the early hours of 08/19/2016 he's been on BiPAP with significant respiratory distress and therefore pulmonary critical care has been consulted.  At baseline he denies any mold exposure or mildew exposure or working with dust. He denies any birds in the house. He continues to smoke.   Events: 08/17/2016 - admit 08/18/2016-respiratory  virus panel, HIV and flu panel PCR negative 08/19/16 -bipap, move to icu -> intubated 10/21 10/22 repeat RVP from trach aspirate - negative 10/23 levo off 10/24 nimbex off 10/25 --> RR adjusted from 35 to 30 overnight due to respiratory acidosis. Did not improve gas, so RR decreased to 30 again. P/F ratios of 275 and 171, respectively.  10/26 --> ABGs improved on pressure control vent settings. FiO2 increased from 40 to 60 overnight due to desaturations.  10/29 - beginning to wean 10/30 - Fever to 100.8 F yesterday 10/31 - Fever to 100.9 F overnight. Not tolerating wean of sedation without becoming very asynchronous with vent. On 20 of PC above PEEP.  11/1- Fever to 101.9 this morning. Very obtunded. MRI brain obtained and negative.  11/2- OG tube out of place this morning. Off all sedating medications. EEG :  moderate generalized slowing of brain activity.  09/01/16 =Febrile overnight to 102.1. Still very obtunded without spontaneous movements. 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  Dx of toxic metabolic encephalopathy   SUBJECTIVE/OVERNIGHT/INTERVAL HX 09/02/16 - shaking head wich is an improvement but overall flaccid. 30% fio2. Spiked temp 102.61F despite antibiotics.  Na astill 150  VITAL SIGNS: BP 123/82   Pulse 93   Temp 98.7 F (37.1 C) (Rectal)   Resp (!) 28   Ht 5\' 9"  (1.753 m)   Wt 96.2 kg (212 lb)   SpO2 96%   BMI 31.31 kg/m   HEMODYNAMICS:    Lines: Foley 10/21 >> ETT and OG 10/21 >> CVC 10/22 >> PIV x 3 A-line left radial 10/22 >>>10/30 VENTILATOR SETTINGS: Vent Mode: PCV FiO2 (%):  [  30 %] 30 % Set Rate:  [16 bmp] 16 bmp PEEP:  [5 cmH20] 5 cmH20 Plateau Pressure:  [19 cmH20-20 cmH20] 20 cmH20  INTAKE / OUTPUT: I/O last 3 completed shifts: In: 3993.7 [I.V.:238.7; NG/GT:2905; IV Piggyback:850] Out: 2725 [Urine:2725]    PHYSICAL EXAMINATION: General:  Middle-aged, sedated, ETT in  place Neuro: Sedated, no response to sternal rub or voice but mving head back and forth. Flaccid tone all 4s. Has lost muscle mass HEENT:  ETT in place Cardiovascular:  Regular rate and rhythm Lungs: Decreased BS at bases Abdomen:  Soft nontender Musculoskeletal:  No cyanosis no clubbing no edema. Looks euvolemic; clean dry bandage over R shoulder Skin:  Intact on exposed areas. R LE cool compared to L LE but with good cap refill and palpable pedal pulses. - improved 09/02/16  LABS:  PULMONARY  Recent Labs Lab 08/26/16 1520 08/27/16 0330 08/28/16 0352 08/28/16 1000 08/29/16 0300  PHART 7.49* 7.464* 7.478* 7.446 7.454*  PCO2ART 47.1 48.3* 44.9 49.2* 47.4  PO2ART 89.1 81.2* 79.3* 89.0 82.4*  HCO3 35.5* 34.2* 32.9* 33.4* 32.8*  O2SAT 96.5 95.5 95.6 96.6 95.8   CBC  Recent Labs Lab 08/31/16 0658 09/01/16 0403 09/02/16 0500  HGB 14.0 14.5 14.1  HCT 44.2 45.6 44.3  WBC 21.2* 26.9* 29.4*  PLT 169 218 211   COAGULATION No results for input(s): INR in the last 168 hours. CARDIAC   No results for input(s): TROPONINI in the last 168 hours. No results for input(s): PROBNP in the last 168 hours.  CHEMISTRY  Recent Labs Lab 08/28/16 0624  08/29/16 0327 08/30/16 0230 08/31/16 0658 09/01/16 0403 09/02/16 0500  NA 148*  < > 146* 147* 150* 150* 150*  K 4.0  < > 4.3 4.2 3.7 3.6 3.5  CL 109  < > 107 105 109 111 114*  CO2 32  < > 33* 34* 34* 30 28  GLUCOSE 257*  < > 273* 200* 193* 149* 153*  BUN 41*  < > 41* 41* 38* 44* 43*  CREATININE 0.88  < > 0.82 0.91 0.72 0.78 0.76  CALCIUM 8.2*  < > 8.2* 8.2* 8.7* 8.6* 8.1*  MG 2.6*  --  2.5* 2.4  --  2.8* 2.6*  PHOS 3.4  --  3.7 3.8  --  3.8 4.3  < > = values in this interval not displayed. Estimated Creatinine Clearance: 119.4 mL/min (by C-G formula based on SCr of 0.76 mg/dL).  LIVER  Recent Labs Lab 09/01/16 0929  AST 32  ALT 62  ALKPHOS 31*  BILITOT 0.7  PROT 5.3*  ALBUMIN 2.3*   INFECTIOUS  Recent Labs Lab  09/01/16 0929 09/01/16 0930 09/01/16 1201 09/02/16 0005 09/02/16 0500  LATICACIDVEN  --  1.3 2.4* 1.1  --   PROCALCITON 0.15  --   --   --  0.24   ENDOCRINE CBG (last 3)   Recent Labs  09/02/16 0558 09/02/16 0655 09/02/16 0742  GLUCAP 145* 184* 203*   IMAGING x48h  - image(s) personally visualized  -   highlighted in bold Dg Chest Port 1 View  Result Date: 09/02/2016 CLINICAL DATA:  Check endotracheal tube placement EXAM: PORTABLE CHEST 1 VIEW COMPARISON:  09/01/2016 FINDINGS: Cardiac shadow is stable. Endotracheal tube, nasogastric catheter and right jugular central venous line are noted and stable. The lungs are well aerated bilaterally without focal infiltrate. Some mild increasing left basilar atelectasis is seen. IMPRESSION: Tubes and lines as described above. Left basilar atelectasis is noted new from the  prior exam. Electronically Signed   By: Alcide Clever M.D.   On: 09/02/2016 07:15   Dg Chest Port 1 View  Result Date: 09/01/2016 CLINICAL DATA:  Intubation. EXAM: PORTABLE CHEST 1 VIEW COMPARISON:  08/28/2016. FINDINGS: Endotracheal tube, NG tube, right IJ line in stable position. Heart size normal. Low lung volumes. Mild bibasilar infiltrates have improved from prior exam. Right costophrenic angle not completely imaged. No pleural effusion noted on today's exam. No pneumothorax . IMPRESSION: 1. Lines and tubes in stable position. 2. Low lung volumes Mild bibasilar infiltrates, improved from prior exam. No pleural effusion noted on today's exam. Electronically Signed   By: Maisie Fus  Register   On: 09/01/2016 07:18    Summary:  55-y/o male who developed acute respiratory distress after shoulder surgery likely 2/2 ILD but treated for possible infectious etiology. Moving towards extubation, but mental status has been a barrier.   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 No known ILD exposures per hx  #Admit 08/17/2016 - Abrupt respiratory illness with acute hypoxemic respiratory failure with significant deterioration 3 days after right shoulder surgery. Pulmonary embolism ruled out. Diffuse ground glass opacities on CT chest at admission  - Differential diagnosis includes: Undiagnosed IPF flare, acute lung injury postop idiopathic  - Rx with ARDS net, nimbex through 08/22/16 and steroids (BAL 10/24 -non diag, normal autoimmne, RVP)  #Current 11/4\10/2015  - down to 30% fio2 but encephalopathy and myoneuropathy preventing wean  P:   Continue steroids until extubated, taper down 30 mg daily  - need end point Ipratroprium and xopenex q6h. Plan for trach week of 11/6 if unable to extubate    CARDIOVASCULAR A:  MI ruled out but troponin increased to 0.07. Suspect demand. ECHO ok 10/22 - started on levophed, discontinued 10/23    P:  Monitor Even balance goals to neg    RENAL A:   AKI resolved  09/02/16 - still hypernatermiac P:   Free water increase Replace electrolytes as indicated. Follow BMET.   GASTROINTESTINAL A:   At risk for aspiration with intubation P:   NPO except meds TFs per nutrition  HEMATOLOGIC A:   At risk for anemia of critical illness Leukocytosis persistent and worse  P:  Monitor - stable. DVT prophylaxis - lovenox  INFECTIOUS A:   Unclear if this is community acquired pneumonia- treated Fever curve worsening   P:  Await panc culture 09/01/16 Follow fever curve WBC Tylenol for fevers  Abx: Zosyn 10/21>>>10/30 Vanc 10/21>>>10/26  ENDOCRINE A:   At risk for hyperglycemia  - CBGs now well controlled  P:   Changed to Insulin gtt for persistent hyperglycemia  Solumedrol 40 daily (day 13), reduced 11/2  NEUROLOGIC A:   Toxic metabolic encephalopathy with flaccidity 09/02/16 - critical illnes myoneuropathy Discontinued Fentanyl   Discontinued Diprivan  Discontinued Risperdal and haldol   P:   Monitor RASS goal: -1 Correct metabolic issues above   FAMILY  -  Interdisciplinary Goals of Care Family Meeting   Date carried out:: 09/02/2016  Location of the meeting: Bedside  Member's involved: Physician, Bedside Registered Nurse, Family Member or next of kin and Other: son and wife  Durable Power of Pensions consultant or Environmental health practitioner: wife    Discussion: We discussed goals of care for TREVAN MESSMAN .  -continue acute care. Ok for trach  but she wants to make sure is time ltd trial of few months max. She wants ability to do terminal wean and comfort careif unimproved in weeks to months  Code status: Full Code  Disposition: Continue current acute care  Time spent for the meeting: 10 min  Rebekkah Powless 09/02/2016, 11:46 AM    The patient is critically ill with multiple organ systems failure and requires high complexity decision making for assessment and support, frequent evaluation and titration of therapies, application of advanced monitoring technologies and extensive interpretation of multiple databases.   Critical Care Time devoted to patient care services described in this note is  30  Minutes. This time reflects time of care of this signee Dr Kalman ShanMurali Santoria Chason. This critical care time does not reflect procedure time, or teaching time or supervisory time of PA/NP/Med student/Med Resident etc but could involve care discussion time    Dr. Kalman ShanMurali Penda Venturi, M.D., [facility] RojoF.C.C.P Pulmonary and Critical Care Medicine Staff Physician [facility] Pager: [phone], If no answer or between  15:00h - 7:00h: call [phone]  09/02/2016 11:47 AM

## 2016-09-02 NOTE — Progress Notes (Signed)
eLink Physician-Brief Progress Note Patient Name: Bradley [name] DOB: 1961-07-26 MRN: [id number]   Date of Service  09/02/2016  HPI/Events of Note  Notified by bedside nurse of accelerated respiratory rate. Currently normotensive with normal saturation. Patient still not following commands or responsive. Also patient has previously been discontinued.   eICU Interventions  Fentanyl IV when necessary pain/discomfort      Intervention Category Major Interventions: Change in mental status - evaluation and management;Respiratory failure - evaluation and management  [name] 09/02/2016, 3:36 AM

## 2016-09-02 NOTE — Progress Notes (Signed)
Subjective: Still minimal alertness without any sedation. BCx, UCx, SCx negative so far.  CXR normal.  MRI Brain normal. EEG showed moderate generalized slowing of brain activity.  He continues to have fever with elevated WBC without clear source.  Objective: Vital signs in last 24 hours: Temp:  [98.3 F (36.8 C)-103.3 F (39.6 C)] 100.3 F (37.9 C) (11/04 0740) Pulse Rate:  [85-108] 93 (11/04 0301) Resp:  [21-36] 29 (11/04 0600) BP: (71-151)/(50-88) 122/88 (11/04 0600) SpO2:  [97 %-100 %] 98 % (11/04 0730) FiO2 (%):  [30 %] 30 % (11/04 0730)  Intake/Output from previous day: 11/03 0701 - 11/04 0700 In: 2943.6 [I.V.:168.6; EH/MC:9470; IV Piggyback:800] Out: 1875 [Urine:1875] Intake/Output this shift: No intake/output data recorded. Nutritional status: Diet NPO time specified Except for: Sips with Meds  Neurologic Exam: No sedation Intubated PERL Corneals intact OCR intact Gag reflex intact Grimaces to pain in all 4 extremities No withdrawal to pain No babinski No hoffman's  Lab Results:  Recent Labs  09/01/16 0403 09/02/16 0500  WBC 26.9* 29.4*  HGB 14.5 14.1  HCT 45.6 44.3  PLT 218 211  NA 150* 150*  K 3.6 3.5  CL 111 114*  CO2 30 28  GLUCOSE 149* 153*  BUN 44* 43*  CREATININE 0.78 0.76  CALCIUM 8.6* 8.1*   Lipid Panel No results for input(s): CHOL, TRIG, HDL, CHOLHDL, VLDL, LDLCALC in the last 72 hours.  Studies/Results: Dg Chest Port 1 View  Result Date: 09/02/2016 CLINICAL DATA:  Check endotracheal tube placement EXAM: PORTABLE CHEST 1 VIEW COMPARISON:  09/01/2016 FINDINGS: Cardiac shadow is stable. Endotracheal tube, nasogastric catheter and right jugular central venous line are noted and stable. The lungs are well aerated bilaterally without focal infiltrate. Some mild increasing left basilar atelectasis is seen. IMPRESSION: Tubes and lines as described above. Left basilar atelectasis is noted new from the prior exam. Electronically Signed   By: Inez Catalina M.D.   On: 09/02/2016 07:15   Dg Chest Port 1 View  Result Date: 09/01/2016 CLINICAL DATA:  Intubation. EXAM: PORTABLE CHEST 1 VIEW COMPARISON:  08/28/2016. FINDINGS: Endotracheal tube, NG tube, right IJ line in stable position. Heart size normal. Low lung volumes. Mild bibasilar infiltrates have improved from prior exam. Right costophrenic angle not completely imaged. No pleural effusion noted on today's exam. No pneumothorax . IMPRESSION: 1. Lines and tubes in stable position. 2. Low lung volumes Mild bibasilar infiltrates, improved from prior exam. No pleural effusion noted on today's exam. Electronically Signed   By: Marcello Moores  Register   On: 09/01/2016 07:18    Medications:  Scheduled: . chlorhexidine gluconate (MEDLINE KIT)  15 mL Mouth Rinse BID  . enoxaparin (LOVENOX) injection  40 mg Subcutaneous Q24H  . famotidine (PEPCID) IV  20 mg Intravenous Q12H  . free water  250 mL Per Tube Q8H  . ipratropium  0.5 mg Nebulization Q6H  . labetalol  100 mg Oral TID  . levalbuterol  1.25 mg Nebulization Q6H  . magnesium hydroxide  30 mL Per Tube Once  . mouth rinse  15 mL Mouth Rinse QID  . piperacillin-tazobactam (ZOSYN)  IV  3.375 g Intravenous Q8H  . predniSONE  40 mg Per NG tube Q breakfast  . vancomycin  1,000 mg Intravenous Q8H    Assessment/Plan:  Metabolic/infectious  Encephalopathy without clear source.  Brainstem is intact and cortical function is present but depressed.  Await response to antibiotics and time.  May consider repeating EEG at some point if no  improvement.    LOS: 15 days   Rogue Jury, MD 09/02/2016  8:38 AM

## 2016-09-02 NOTE — Progress Notes (Signed)
   VASCULAR SURGERY ASSESSMENT & PLAN:  Now with a palpable right DP pulse.  Vascular will be available as needed.   SUBJECTIVE: On vent  PHYSICAL EXAM: Vitals:   09/02/16 0338 09/02/16 0400 09/02/16 0500 09/02/16 0600  BP:  96/75 111/81 122/88  Pulse:      Resp:  (!) 29 (!) 26 (!) 29  Temp: 99.7 F (37.6 C)     TempSrc: Oral     SpO2:  98% 98% 98%  Weight:      Height:       Palpable DP pulses.   LABS: Lab Results  Component Value Date   WBC 29.4 (H) 09/02/2016   HGB 14.1 09/02/2016   HCT 44.3 09/02/2016   MCV 95.7 09/02/2016   PLT 211 09/02/2016   Lab Results  Component Value Date   CREATININE 0.76 09/02/2016   Lab Results  Component Value Date   INR 1.20 08/18/2016   CBG (last 3)   Recent Labs  09/02/16 0356 09/02/16 0456 09/02/16 0558  GLUCAP 156* 137* 145*    Principal Problem:   Acute respiratory failure with hypoxia (HCC) Active Problems:   Pulmonary fibrosis (HCC)   Cigarette smoker   Right rotator cuff tear   Community acquired pneumonia   COPD exacerbation (HCC)   Depression   Sepsis (HCC)   ARDS (adult respiratory distress syndrome) (HCC)   Endotracheal tube present    Bradley CarawayChris Quientin [name] Beeper: [phone]: [phone] 09/02/2016

## 2016-09-02 NOTE — Progress Notes (Signed)
eLink Physician-Brief Progress Note Patient Name: Bradley [name] DOB: 04-Oct-1961 MRN: [id number]   Date of Service  09/02/2016  HPI/Events of Note  Hyperglycemia - last 6 blood glucoses < 180 on insulin IV infusion.   eICU Interventions  Will transition to Lantus + Novolog SSI per phase 3 protocol.     Intervention Category Major Interventions: Hyperglycemia - active titration of insulin therapy  [name] 09/02/2016, 10:07 PM

## 2016-09-02 NOTE — Progress Notes (Signed)
Patient has been restless moving head side to side.Found central line completely out with cath intact. No bleeding noted on site. Pressure dressing applied. Dr Marchelle Gearingamaswamy made aware. Ordered PICC line.

## 2016-09-03 ENCOUNTER — Inpatient Hospital Stay (HOSPITAL_COMMUNITY): Payer: BLUE CROSS/BLUE SHIELD

## 2016-09-03 LAB — BASIC METABOLIC PANEL
Anion gap: 6 (ref 5–15)
BUN: 30 mg/dL — ABNORMAL HIGH (ref 6–20)
CHLORIDE: 111 mmol/L (ref 101–111)
CO2: 29 mmol/L (ref 22–32)
Calcium: 7.7 mg/dL — ABNORMAL LOW (ref 8.9–10.3)
Creatinine, Ser: 0.61 mg/dL (ref 0.61–1.24)
GFR calc non Af Amer: >60 mL/min (ref >60)
Glucose, Bld: 197 mg/dL — ABNORMAL HIGH (ref 65–99)
POTASSIUM: 3.5 mmol/L (ref 3.5–5.1)
SODIUM: 146 mmol/L — AB (ref 135–145)

## 2016-09-03 LAB — PROCALCITONIN: Procalcitonin: 0.33 ng/mL

## 2016-09-03 LAB — GLUCOSE, CAPILLARY
GLUCOSE-CAPILLARY: 165 mg/dL — AB (ref 65–99)
GLUCOSE-CAPILLARY: 196 mg/dL — AB (ref 65–99)
Glucose-Capillary: 202 mg/dL — ABNORMAL HIGH (ref 65–99)
Glucose-Capillary: 194 mg/dL — ABNORMAL HIGH (ref 65–99)
Glucose-Capillary: 210 mg/dL — ABNORMAL HIGH (ref 65–99)
Glucose-Capillary: 182 mg/dL — ABNORMAL HIGH (ref 65–99)

## 2016-09-03 LAB — VANCOMYCIN, TROUGH: VANCOMYCIN TR: 14 ug/mL — AB (ref 15–20)

## 2016-09-03 LAB — CBC
HEMATOCRIT: 39.4 % (ref 39.0–52.0)
HEMOGLOBIN: 12.4 g/dL — AB (ref 13.0–17.0)
MCH: 29.7 pg (ref 26.0–34.0)
MCHC: 31.5 g/dL (ref 30.0–36.0)
MCV: 94.5 fL (ref 78.0–100.0)
Platelets: 156 10*3/uL (ref 150–400)
RBC: 4.17 MIL/uL — AB (ref 4.22–5.81)
RDW: 15.1 % (ref 11.5–15.5)
WBC: 19.9 10*3/uL — AB (ref 4.0–10.5)

## 2016-09-03 LAB — PHOSPHORUS: Phosphorus: 2.8 mg/dL (ref 2.5–4.6)

## 2016-09-03 LAB — MAGNESIUM: MAGNESIUM: 2.4 mg/dL (ref 1.7–2.4)

## 2016-09-03 LAB — CK TOTAL AND CKMB (NOT AT ARMC)
CK TOTAL: 251 U/L (ref 49–397)
CK, MB: 9.5 ng/mL — AB (ref 0.5–5.0)
RELATIVE INDEX: 3.8 — AB (ref 0.0–2.5)

## 2016-09-03 LAB — PROTIME-INR
INR: 1.03
PROTHROMBIN TIME: 13.5 s (ref 11.4–15.2)

## 2016-09-03 LAB — TRIGLYCERIDES: TRIGLYCERIDES: 173 mg/dL — AB (ref <150)

## 2016-09-03 MED ORDER — POTASSIUM CHLORIDE 20 MEQ/15ML (10%) PO SOLN
40.0000 meq | Freq: Once | ORAL | Status: AC
  Administered 2016-09-03: 40 meq
  Filled 2016-09-03: qty 30

## 2016-09-03 MED ORDER — PROPOFOL 1000 MG/100ML IV EMUL
5.0000 ug/kg/min | INTRAVENOUS | Status: DC
  Administered 2016-09-03: 30 ug/kg/min via INTRAVENOUS
  Administered 2016-09-03 – 2016-09-04 (×2): 20 ug/kg/min via INTRAVENOUS
  Administered 2016-09-04: 15 ug/kg/min via INTRAVENOUS
  Administered 2016-09-04 – 2016-09-05 (×3): 25 ug/kg/min via INTRAVENOUS
  Filled 2016-09-03 (×2): qty 100
  Filled 2016-09-03: qty 200
  Filled 2016-09-03 (×2): qty 100

## 2016-09-03 MED ORDER — INSULIN ASPART 100 UNIT/ML ~~LOC~~ SOLN
0.0000 [IU] | SUBCUTANEOUS | Status: DC
  Administered 2016-09-03: 3 [IU] via SUBCUTANEOUS
  Administered 2016-09-03 (×2): 5 [IU] via SUBCUTANEOUS
  Administered 2016-09-03 – 2016-09-05 (×4): 3 [IU] via SUBCUTANEOUS
  Administered 2016-09-05 (×2): 5 [IU] via SUBCUTANEOUS
  Administered 2016-09-05 (×2): 3 [IU] via SUBCUTANEOUS
  Administered 2016-09-05: 5 [IU] via SUBCUTANEOUS
  Administered 2016-09-06 (×3): 3 [IU] via SUBCUTANEOUS
  Administered 2016-09-06: 8 [IU] via SUBCUTANEOUS
  Administered 2016-09-06 – 2016-09-07 (×3): 3 [IU] via SUBCUTANEOUS
  Administered 2016-09-07: 2 [IU] via SUBCUTANEOUS
  Administered 2016-09-07: 3 [IU] via SUBCUTANEOUS
  Administered 2016-09-07 – 2016-09-08 (×2): 2 [IU] via SUBCUTANEOUS
  Administered 2016-09-08: 3 [IU] via SUBCUTANEOUS
  Administered 2016-09-08: 2 [IU] via SUBCUTANEOUS
  Administered 2016-09-08: 3 [IU] via SUBCUTANEOUS
  Administered 2016-09-08: 2 [IU] via SUBCUTANEOUS

## 2016-09-03 MED ORDER — SODIUM CHLORIDE 0.9 % IV SOLN
Freq: Once | INTRAVENOUS | Status: AC
  Administered 2016-09-03: 10:00:00 via INTRAVENOUS

## 2016-09-03 MED ORDER — PROPOFOL 1000 MG/100ML IV EMUL
INTRAVENOUS | Status: AC
  Filled 2016-09-03: qty 100

## 2016-09-03 MED ORDER — FREE WATER
300.0000 mL | Freq: Four times a day (QID) | Status: DC
  Administered 2016-09-03 – 2016-09-05 (×5): 300 mL

## 2016-09-03 NOTE — Progress Notes (Signed)
PULMONARY / CRITICAL CARE MEDICINE   Name: Bradley [name] MRN: [id number] DOB: 08-26-1961    ADMISSION DATE:  08/17/2016 CONSULTATION DATE:  08/19/16  REFERRING MD:  Dr Thayer Ohmhris Rama  CHIEF COMPLAINT:  Acute resp failure with hypoxemia  BRIEF 55 year old smoker, with PFT nov 2016 showing isolated reduction in dlco to 59% and quite functional and not on home o2. Followed by Dr Sherene SiresWert -0 last seen Sept/Nov 2016 based on CT chest evidence showing diffuse changes of centrilobular and paraseptal emphysema along with prominent interstitial markings primarily in the upper lobes consistent with pulmonary fibrosis. This was a non--UIP pattern. Patient recommended quitting smoking because of concerns of DIP. According to the patient and his wife, has continued to smoke. He has been in his baseline status of health and very functional. Then on 08/14/2016 he had a chest x-ray preoperative that only showed chronic changes without change for a right shoulder surgery rotator cuff tear. The procedure was uneventful. Postoperative course was uneventful. There was no vomiting. Then abruptly on 08/16/2016 he started getting acutely ill with shortness of breath and cough with brown colored sputum. Then admitted to the hospital. Initial white count was 13,000. BNP was normal. CT angiogram chest ruled out pulmonary embolism but showed diffuse ground glass opacities and enlarged mediastinal node measuring close to 2 cm. He was started on community acquired pneumonia antibiotics but has developed progressive acute respiratory failure necessitating Solu-Medrol since 08/18/2016 evening. And from the early hours of 08/19/2016 he's been on BiPAP with significant respiratory distress and therefore pulmonary critical care has been consulted.  At baseline he denies any mold exposure or mildew exposure or working with dust. He denies any birds in the house. He continues to smoke.   Events: 08/17/2016 - admit 08/18/2016-respiratory  virus panel, HIV and flu panel PCR negative 08/19/16 -bipap, move to icu -> intubated 10/21 10/22 repeat RVP from trach aspirate - negative 10/23 levo off 10/24 nimbex off 10/25 --> RR adjusted from 35 to 30 overnight due to respiratory acidosis. Did not improve gas, so RR decreased to 30 again. P/F ratios of 275 and 171, respectively.  10/26 --> ABGs improved on pressure control vent settings. FiO2 increased from 40 to 60 overnight due to desaturations.  10/29 - beginning to wean 10/30 - Fever to 100.8 F yesterday 10/31 - Fever to 100.9 F overnight. Not tolerating wean of sedation without becoming very asynchronous with vent. On 20 of PC above PEEP.  11/1- Fever to 101.9 this morning. Very obtunded. MRI brain obtained and negative.  11/2- OG tube out of place this morning. Off all sedating medications. EEG :  moderate generalized slowing of brain activity.  09/01/16 =Febrile overnight to 102.1. Still very obtunded without spontaneous movements. 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  Dx of toxic metabolic encephalopathy 09/02/16 - shaking head wich is an improvement but overall flaccid. 30% fio2. Spiked temp 102.75F despite antibiotics.  Na astill 150  SUBJECTIVE/OVERNIGHT/INTERVAL HX 09/03/16 - fever and Na much better -> more awake , folowing some command.  Increased limb movement but still weak diffusely. For PDT by Dr Tyson AliasFeinstein 09/04/16 AM.    VITAL SIGNS: BP (!) 89/65   Pulse 82   Temp 98.8 F (37.1 C) (Oral)   Resp 17   Ht 5\' 9"  (1.753 m)   Wt 96.2 kg (212 lb)   SpO2 100%   BMI 31.31 kg/m  HEMODYNAMICS:    Lines: Foley 10/21 >> ETT and OG 10/21 >> CVC 10/22 >> PIV x 3 A-line left radial 10/22 >>>10/30 VENTILATOR SETTINGS: Vent Mode: PSV;CPAP FiO2 (%):  [30 %] 30 % Set Rate:  [16 bmp] 16 bmp Vt Set:  [440 mL] 440 mL PEEP:  [5 cmH20] 5 cmH20 Pressure Support:  [10 cmH20] 10 cmH20 Plateau  Pressure:  [12 cmH20-16 cmH20] 16 cmH20  INTAKE / OUTPUT: I/O last 3 completed shifts: In: 4431.7 [I.V.:381.7; NG/GT:3000; IV Piggyback:1050] Out: 3425 [Urine:3425]    PHYSICAL EXAMINATION: General:  Middle-aged, sedated, ETT in place Neuroh. Flaccid tone all 4s but better on11/5/17 and got movement . Has lost muscle mass . Opening eyes - new 09/03/16. Followed some command HEENT:  ETT in place Cardiovascular:  Regular rate and rhythm Lungs: Decreased BS at bases Abdomen:  Soft nontender Musculoskeletal:  No cyanosis no clubbing no edema. Looks euvolemic; clean dry bandage over R shoulder Skin:  Intact on exposed areas. R LE cool compared to L LE but with good cap refill and palpable pedal pulses. - improved 09/03/16  LABS:  PULMONARY  Recent Labs Lab 08/28/16 0352 08/28/16 1000 08/29/16 0300  PHART 7.478* 7.446 7.454*  PCO2ART 44.9 49.2* 47.4  PO2ART 79.3* 89.0 82.4*  HCO3 32.9* 33.4* 32.8*  O2SAT 95.6 96.6 95.8   CBC  Recent Labs Lab 09/01/16 0403 09/02/16 0500 09/03/16 0505  HGB 14.5 14.1 12.4*  HCT 45.6 44.3 39.4  WBC 26.9* 29.4* 19.9*  PLT 218 211 156   COAGULATION  Recent Labs Lab 09/03/16 0505  INR 1.03   CARDIAC   No results for input(s): TROPONINI in the last 168 hours. No results for input(s): PROBNP in the last 168 hours.  CHEMISTRY  Recent Labs Lab 08/29/16 0327 08/30/16 0230 08/31/16 0658 09/01/16 0403 09/02/16 0500 09/03/16 0505  NA 146* 147* 150* 150* 150* 146*  K 4.3 4.2 3.7 3.6 3.5 3.5  CL 107 105 109 111 114* 111  CO2 33* 34* 34* 30 28 29   GLUCOSE 273* 200* 193* 149* 153* 197*  BUN 41* 41* 38* 44* 43* 30*  CREATININE 0.82 0.91 0.72 0.78 0.76 0.61  CALCIUM 8.2* 8.2* 8.7* 8.6* 8.1* 7.7*  MG 2.5* 2.4  --  2.8* 2.6* 2.4  PHOS 3.7 3.8  --  3.8 4.3 2.8   Estimated Creatinine Clearance: 119.4 mL/min (by C-G formula based on SCr of 0.61 mg/dL).  LIVER  Recent Labs Lab 09/01/16 0929 09/03/16 0505  AST 32  --   ALT 62  --    ALKPHOS 31*  --   BILITOT 0.7  --   PROT 5.3*  --   ALBUMIN 2.3*  --   INR  --  1.03   INFECTIOUS  Recent Labs Lab 09/01/16 0929 09/01/16 0930 09/01/16 1201 09/02/16 0005 09/02/16 0500 09/03/16 0505  LATICACIDVEN  --  1.3 2.4* 1.1  --   --   PROCALCITON 0.15  --   --   --  0.24 0.33   ENDOCRINE CBG (last 3)   Recent Labs  09/02/16 2326 09/03/16 0410 09/03/16 0736  GLUCAP 165* 202* 196*   IMAGING x48h  - image(s) personally visualized  -   highlighted in bold Dg Chest Port 1 View  Result Date: 09/03/2016 CLINICAL DATA:  Check endotracheal tube EXAM: PORTABLE CHEST 1 VIEW COMPARISON:  09/02/2016 FINDINGS: Cardiac shadow is stable. Right-sided PICC line is noted in satisfactory position at the cavoatrial junction. Nasogastric catheter and endotracheal tube are noted  in satisfactory position. A right jugular central line is been removed in the interval. The lungs show bibasilar atelectasis increased from the prior exam. No bony abnormality is seen. IMPRESSION: Bibasilar atelectatic changes. Electronically Signed   By: Alcide Clever M.D.   On: 09/03/2016 07:13   Dg Chest Port 1 View  Result Date: 09/02/2016 CLINICAL DATA:  Check endotracheal tube placement EXAM: PORTABLE CHEST 1 VIEW COMPARISON:  09/01/2016 FINDINGS: Cardiac shadow is stable. Endotracheal tube, nasogastric catheter and right jugular central venous line are noted and stable. The lungs are well aerated bilaterally without focal infiltrate. Some mild increasing left basilar atelectasis is seen. IMPRESSION: Tubes and lines as described above. Left basilar atelectasis is noted new from the prior exam. Electronically Signed   By: Alcide Clever M.D.   On: 09/02/2016 07:15    Summary:  55-y/o male who developed acute respiratory distress after shoulder surgery likely 2/2 ILD but treated for possible infectious etiology. Moving towards extubation, but mental status has been a barrier.   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 No known ILD exposures per hx  #Admit 08/17/2016 - Abrupt respiratory illness with acute hypoxemic respiratory failure with significant deterioration 3 days after right shoulder surgery. Pulmonary embolism ruled out. Diffuse ground glass opacities on CT chest at admission  - Differential diagnosis includes: Undiagnosed IPF flare, acute lung injury postop idiopathic  - Rx with ARDS net, nimbex through 08/22/16 and steroids (BAL 10/24 -non diag, normal autoimmne, RVP)  #Current 11/51/2017  - down to 30% fio2 but encephalopathy and myoneuropathy preventing extubation  P:   Solumedrol 30 daily (day 15), reduced 09/02/16 - would dc this by d21 Ipratroprium and xopenex q6h. Plan for trach week of 11/6 if unable to extubate  -Dr Tyson Alias aware    - [facility] discusse with wife on 09/02/16 Risks of pneumothorax, hemothorax, sedation/anesthesia complications such as cardiac or respiratory arrest or hypotension, stroke and bleeding all explained. Benefits of diagnosis but limitations of non-diagnosis also explained. Patient verbalized understanding and wished to proceed.     CARDIOVASCULAR A:  MI ruled out but troponin increased to 0.07. Suspect demand. ECHO ok 10/22 - started on levophed, discontinued 10/23   -nil acute   P:  Monitor Even balance goals to neg    RENAL A:   AKI resolved  09/03/16 - hypernatermia improved. Mild low K P:   Free water decrease Replace electrolytes as indicated. Follow BMET.   GASTROINTESTINAL A:   Nil acute P:   NPO except meds TFs per nutrition PPI  HEMATOLOGIC A:   At risk for anemia of critical illness Leukocytosis persistent and worse  P:  Monitor - stable. DVT prophylaxis - lovenox  INFECTIOUS Cutlures 09/01/16 - neg as  of 11/65/17  A:   Unclear if this is community acquired pneumonia- treated  Fever curve improved with wbc  Improvement on 09/03/16 since antibiotics on 09/01/16  P:  Await panc culture 09/01/16 Follow fever curve WBC Tylenol for fevers  > 102F  Abx: Zosyn 10/21>>>10/30 Vanc 10/21>>>10/26  ENDOCRINE A:   At risk for hyperglycemia  - CBGs now well controlled  P:   Changed to Insulin gtt for persistent hyperglycemia  Solumedrol 30 daily (day 15), reduced 09/02/16 - would dc this by d21  NEUROLOGIC A:   Toxic metabolic encephalopathy with flaccidity 09/02/16 -  critical illnes myoneuropathy   - improved 11/5/1/7 but needs trach but seems a bit agitated   P:   Start diprivan Monitor RASS goal: -1 Correct metabolic issues above (fever and high Na) Avoid opiopids, benzoe  FAMILY  - wife updated 09/03/16 at bedside by Dr Marchelle Gearing     The patient is critically ill with multiple organ systems failure and requires high complexity decision making for assessment and support, frequent evaluation and titration of therapies, application of advanced monitoring technologies and extensive interpretation of multiple databases.   Critical Care Time devoted to patient care services described in this note is  30  Minutes. This time reflects time of care of this signee Dr Kalman Shan. This critical care time does not reflect procedure time, or teaching time or supervisory time of PA/NP/Med student/Med Resident etc but could involve care discussion time    Dr. Kalman Shan, M.D., [facility].C.P Pulmonary and Critical Care Medicine Staff Physician [facility] [place] [facility] Pager: [phone], If no answer or between  15:00h - 7:00h: call [phone]  09/03/2016 9:00 AM

## 2016-09-03 NOTE — Progress Notes (Signed)
Orthopedic Tech Progress Note Patient Details:  Ripley FraiseDavid M Melnik 08-Jun-1961 [id number]  Ortho Devices Type of Ortho Device: Postop shoe/boot Ortho Device/Splint Location: heel boot Ortho Device/Splint Interventions: Application   Saul FordyceJennifer C Jakob Kimberlin 09/03/2016, 12:35 PM

## 2016-09-03 NOTE — Progress Notes (Signed)
Subjective: Significantly more awake.  Objective: Vital signs in last 24 hours: Temp:  [98.6 F (37 C)-101.5 F (38.6 C)] 98.8 F (37.1 C) (11/05 0735) Pulse Rate:  [77-87] 82 (11/05 0429) Resp:  [16-37] 17 (11/05 0600) BP: (75-123)/(58-91) 89/65 (11/05 0600) SpO2:  [96 %-100 %] 100 % (11/05 0730) FiO2 (%):  [30 %] 30 % (11/05 0730)  Intake/Output from previous day: 11/04 0701 - 11/05 0700 In: 3210.6 [I.V.:310.6; NG/GT:2100; IV Piggyback:800] Out: 2300 [Urine:2300] Intake/Output this shift: No intake/output data recorded. Nutritional status: Diet NPO time specified Except for: Sips with Meds Diet NPO time specified  Neurologic Exam: Intubated.  No sedation. Eyes open and alert.  Makes good eye contact.  Follows basic commands. Significant weakness proximally and distally in the arms and legs.   No babinski.  Lab Results:  Recent Labs  09/02/16 0500 09/03/16 0505  WBC 29.4* 19.9*  HGB 14.1 12.4*  HCT 44.3 39.4  PLT 211 156  NA 150* 146*  K 3.5 3.5  CL 114* 111  CO2 28 29  GLUCOSE 153* 197*  BUN 43* 30*  CREATININE 0.76 0.61  CALCIUM 8.1* 7.7*   Lipid Panel No results for input(s): CHOL, TRIG, HDL, CHOLHDL, VLDL, LDLCALC in the last 72 hours.  Studies/Results: Dg Chest Port 1 View  Result Date: 09/03/2016 CLINICAL DATA:  Check endotracheal tube EXAM: PORTABLE CHEST 1 VIEW COMPARISON:  09/02/2016 FINDINGS: Cardiac shadow is stable. Right-sided PICC line is noted in satisfactory position at the cavoatrial junction. Nasogastric catheter and endotracheal tube are noted in satisfactory position. A right jugular central line is been removed in the interval. The lungs show bibasilar atelectasis increased from the prior exam. No bony abnormality is seen. IMPRESSION: Bibasilar atelectatic changes. Electronically Signed   By: Inez Catalina M.D.   On: 09/03/2016 07:13   Dg Chest Port 1 View  Result Date: 09/02/2016 CLINICAL DATA:  Check endotracheal tube placement EXAM:  PORTABLE CHEST 1 VIEW COMPARISON:  09/01/2016 FINDINGS: Cardiac shadow is stable. Endotracheal tube, nasogastric catheter and right jugular central venous line are noted and stable. The lungs are well aerated bilaterally without focal infiltrate. Some mild increasing left basilar atelectasis is seen. IMPRESSION: Tubes and lines as described above. Left basilar atelectasis is noted new from the prior exam. Electronically Signed   By: Inez Catalina M.D.   On: 09/02/2016 07:15    Medications:  Scheduled: . chlorhexidine gluconate (MEDLINE KIT)  15 mL Mouth Rinse BID  . enoxaparin (LOVENOX) injection  40 mg Subcutaneous Q24H  . famotidine (PEPCID) IV  20 mg Intravenous Q12H  . free water  300 mL Per Tube Q4H  . insulin aspart  0-15 Units Subcutaneous Q4H  . insulin glargine  15 Units Subcutaneous QHS  . ipratropium  0.5 mg Nebulization Q6H  . labetalol  100 mg Oral TID  . levalbuterol  1.25 mg Nebulization Q6H  . magnesium hydroxide  30 mL Per Tube Once  . mouth rinse  15 mL Mouth Rinse QID  . midazolam  4 mg Intravenous Once  . piperacillin-tazobactam (ZOSYN)  IV  3.375 g Intravenous Q8H  . predniSONE  30 mg Per NG tube Q breakfast  . sodium chloride flush  10-40 mL Intracatheter Q12H  . vancomycin  1,000 mg Intravenous Q8H  . vecuronium  10 mg Intravenous Once    Assessment/Plan:  Encephalopathy- significant improvement and slowly resolving completely.    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  Once extubated, will require inpatient rehab.   LOS: 16 days   Rogue Jury, MD 09/03/2016  8:10 AM

## 2016-09-03 NOTE — Progress Notes (Signed)
Pharmacy Antibiotic Note  [name] is a 55 y.o. male admitted on 08/17/2016 with pneumonia.  Pharmacy has been consulted for zosyn dosing. Patient now has abundant gram negative rods growing in his trach aspirate from 11/3. His Tmax is 101.5 and WBC is improving 19.9. After discussion with Dr. Marchelle Gearingamaswamy will discontinue vancomycin at this time.   Plan: - Discontinue vancomycin - Continue zosyn 3.375g IV q8h - Monitor C&S, CBC and LOT  Height: 5\' 9"  (175.3 cm) Weight:  (scale broken) IBW/kg (Calculated) : 70.7  Temp (24hrs), Avg:99.3 F (37.4 C), Min:98.6 F (37 C), Max:101.5 F (38.6 C)   Recent Labs Lab 08/30/16 0230 08/31/16 0658 09/01/16 0403 09/01/16 0930 09/01/16 1201 09/02/16 0005 09/02/16 0500 09/03/16 0505 09/03/16 0911  WBC 20.5* 21.2* 26.9*  --   --   --  29.4* 19.9*  --   CREATININE 0.91 0.72 0.78  --   --   --  0.76 0.61  --   LATICACIDVEN  --   --   --  1.3 2.4* 1.1  --   --   --   VANCOTROUGH  --   --   --   --   --   --   --   --  14*    Estimated Creatinine Clearance: 119.4 mL/min (by C-G formula based on SCr of 0.61 mg/dL).    Allergies  Allergen Reactions  . Wellbutrin [Bupropion] Cough    Antimicrobials this admission: Azithromycin 10/20 >> 10/21 CTX 10/20 >> 10/21 Vancomycin 10/21 >> 10/26; 11/3>> Zosyn 10/21 >>10/27;  10/28>>10/30;  11/3>>  Dose adjustments this admission:  10/23 VT: 19 > no dose adjustment indicated 10/25: SCr increased significantly, decreased vanco frequency to q12h 11/5: VT 14, slightly subtherapeutic. Vancomycin d/c'd  Microbiology results: 10/20 Respiratory panel PCR: neg 10/20 BCx: NEG 10/22 Trach Aspirate: NEG 10/20 MRSA PCR: neg 10/22 Resp panel PCR: neg 11/3 UCx: no growth  11/3 BCx2: ngtd 11/3 Trach asp: abundant gram neg rods  Thank you for allowing pharmacy to be a part of this patient's care.  Casilda Carlsaylor Toryn Dewalt, PharmD. PGY-2 Infectious Diseases Pharmacy Resident Pager: [phone] 09/03/2016 10:11  AM

## 2016-09-03 NOTE — Consult Note (Signed)
WOC Nurse wound consult note Reason for Consult: Moisture Associated Skin Damage (MASD), specifically intertriginous dermatitis with fungal overgrowth, in the bilateral inguinal areas, also noted at the scrotum and medial thighs. Staff has been using OTC antifungal powder in these areas for 24 hours with no improvement. Wound type: Moisture Pressure Ulcer POA: No Measurement: See above for presentation Wound bed: Diffuse erythema with peeling and satellite lesions.  Scattered pinpoint areas of partial thickness tissue loss with pink wound bed and scant serous drainage. Drainage (amount, consistency, odor) See above Periwound: Intact, dry Dressing procedure/placement/frequency: Patient with intertriginous dermatitis with fungal overgrowth. I will implement use of house antimicrobial textile, InterDry Ag+.  Suggest dosing once or twice with systemic antifungal to expedite resolution (eg., Diflucan).  If you agree, please order. WOC nursing team will not follow, but will remain available to this patient, the nursing and medical teams.  Please re-consult if needed. Thanks, Ladona MowLaurie Candia Kingsbury, MSN, RN, GNP, Hans EdenCWOCN, CWON-AP, FAAN  Pager# [phone]

## 2016-09-03 NOTE — Progress Notes (Signed)
eLink Physician-Brief Progress Note Patient Name: Bradley FraiseDavid M [name] DOB: 02/14/1961 MRN: [id number]   Date of Service  09/03/2016  HPI/Events of Note  Patient transitioned off of insulin drip to subcutaneous insulin. Currently on Lantus 15 units subcutaneous daily at bedtime & overload to use every 4 hours  along with Accu-Cheks every 4 hours but  no sliding scale insulin coverage.  eICU Interventions  1. Discontinuing scheduled NovoLog. 2. Continuing Lantus 3. Continuing Accu-Cheks every 4 hours 4. Starting sliding-scale insulin per moderate algorithm      Intervention Category Intermediate Interventions: Hyperglycemia - evaluation and treatment  Bradley CousinsJennings Ayme [name] 09/03/2016, 4:14 AM

## 2016-09-03 NOTE — Progress Notes (Signed)
RT note-Patient lavaged and bagged with fio2 100%, was unable to pass ETT catheter, now successful to pass, placed back to full support. RN aware.

## 2016-09-04 ENCOUNTER — Inpatient Hospital Stay (HOSPITAL_COMMUNITY): Payer: BLUE CROSS/BLUE SHIELD

## 2016-09-04 DIAGNOSIS — G9340 Encephalopathy, unspecified: Secondary | ICD-10-CM

## 2016-09-04 DIAGNOSIS — G931 Anoxic brain damage, not elsewhere classified: Secondary | ICD-10-CM

## 2016-09-04 LAB — GLUCOSE, CAPILLARY
GLUCOSE-CAPILLARY: 110 mg/dL — AB (ref 65–99)
GLUCOSE-CAPILLARY: 129 mg/dL — AB (ref 65–99)
GLUCOSE-CAPILLARY: 122 mg/dL — AB (ref 65–99)
Glucose-Capillary: 102 mg/dL — ABNORMAL HIGH (ref 65–99)
Glucose-Capillary: 123 mg/dL — ABNORMAL HIGH (ref 65–99)
Glucose-Capillary: 165 mg/dL — ABNORMAL HIGH (ref 65–99)

## 2016-09-04 LAB — CBC
HEMATOCRIT: 35.1 % — AB (ref 39.0–52.0)
Hemoglobin: 11.1 g/dL — ABNORMAL LOW (ref 13.0–17.0)
MCH: 29.8 pg (ref 26.0–34.0)
MCHC: 31.6 g/dL (ref 30.0–36.0)
MCV: 94.4 fL (ref 78.0–100.0)
PLATELETS: 138 10*3/uL — AB (ref 150–400)
RBC: 3.72 MIL/uL — ABNORMAL LOW (ref 4.22–5.81)
RDW: 15.2 % (ref 11.5–15.5)
WBC: 17.1 10*3/uL — ABNORMAL HIGH (ref 4.0–10.5)

## 2016-09-04 LAB — BASIC METABOLIC PANEL
Anion gap: 6 (ref 5–15)
BUN: 25 mg/dL — AB (ref 6–20)
CHLORIDE: 113 mmol/L — AB (ref 101–111)
CO2: 29 mmol/L (ref 22–32)
CREATININE: 0.59 mg/dL — AB (ref 0.61–1.24)
Calcium: 7.8 mg/dL — ABNORMAL LOW (ref 8.9–10.3)
GFR calc Af Amer: >60 mL/min (ref >60)
GFR calc non Af Amer: >60 mL/min (ref >60)
GLUCOSE: 92 mg/dL (ref 65–99)
Potassium: 3.8 mmol/L (ref 3.5–5.1)
Sodium: 148 mmol/L — ABNORMAL HIGH (ref 135–145)

## 2016-09-04 LAB — PHOSPHORUS: Phosphorus: 3.2 mg/dL (ref 2.5–4.6)

## 2016-09-04 LAB — MAGNESIUM: Magnesium: 2.4 mg/dL (ref 1.7–2.4)

## 2016-09-04 MED ORDER — FENTANYL CITRATE (PF) 100 MCG/2ML IJ SOLN
INTRAMUSCULAR | Status: AC
  Administered 2016-09-04: 100 ug via INTRAVENOUS
  Filled 2016-09-04: qty 4

## 2016-09-04 MED ORDER — PROPOFOL 10 MG/ML IV BOLUS
70.0000 mg | Freq: Once | INTRAVENOUS | Status: AC
  Administered 2016-09-04: 70 mg via INTRAVENOUS

## 2016-09-04 MED ORDER — VECURONIUM BROMIDE 10 MG IV SOLR
10.0000 mg | Freq: Once | INTRAVENOUS | Status: AC
  Administered 2016-09-04: 10 mg via INTRAVENOUS

## 2016-09-04 MED ORDER — FENTANYL CITRATE (PF) 100 MCG/2ML IJ SOLN
25.0000 ug | INTRAMUSCULAR | Status: DC | PRN
Start: 2016-09-04 — End: 2016-09-04

## 2016-09-04 MED ORDER — FENTANYL CITRATE (PF) 100 MCG/2ML IJ SOLN
100.0000 ug | Freq: Once | INTRAMUSCULAR | Status: AC
  Administered 2016-09-04: 100 ug via INTRAVENOUS

## 2016-09-04 MED ORDER — MIDAZOLAM HCL 2 MG/2ML IJ SOLN
2.0000 mg | Freq: Once | INTRAMUSCULAR | Status: AC
  Administered 2016-09-04: 2 mg via INTRAVENOUS

## 2016-09-04 MED ORDER — GADOBENATE DIMEGLUMINE 529 MG/ML IV SOLN
20.0000 mL | Freq: Once | INTRAVENOUS | Status: AC | PRN
  Administered 2016-09-04: 20 mL via INTRAVENOUS

## 2016-09-04 MED ORDER — PROPOFOL 10 MG/ML IV BOLUS
100.0000 mg | Freq: Once | INTRAVENOUS | Status: AC
  Administered 2016-09-04: 100 mg via INTRAVENOUS

## 2016-09-04 MED ORDER — MIDAZOLAM HCL 2 MG/2ML IJ SOLN
INTRAMUSCULAR | Status: AC
  Administered 2016-09-04: 2 mg via INTRAVENOUS
  Filled 2016-09-04: qty 4

## 2016-09-04 MED ORDER — PREDNISONE 20 MG PO TABS
20.0000 mg | ORAL_TABLET | Freq: Every day | ORAL | Status: DC
  Administered 2016-09-05: 20 mg via NASOGASTRIC
  Filled 2016-09-04 (×2): qty 1

## 2016-09-04 NOTE — Progress Notes (Addendum)
Nutrition Follow-up  DOCUMENTATION CODES:   Not applicable  INTERVENTION:    After trach placement, resume Vital AF 1.2 via OGT at 75 ml/h to provide 2160 kcals, 135 gm protein, 1460 ml free water daily.   Total intake 2464 kcal, 135 gm protein, 1460 ml free water daily.  NUTRITION DIAGNOSIS:   Inadequate oral intake related to inability to eat as evidenced by NPO status.  Ongoing  GOAL:   Patient will meet greater than or equal to 90% of their needs  Met  MONITOR:   Vent status, Labs, Weight trends, TF tolerance  ASSESSMENT:   55 y.o. male with medical history significant of COPD, asthma, pulmonary fibrosis, depression, anxiety, kidney stone, tobacco abuse, who presents with cough and shortness of breath: dx with PNA.  Discussed patient in ICU rounds and with RN today. Plans for tracheostomy today. TF is currently off for procedure today. Previously receiving Vital AF 1.2 via OGT at 75 ml/h to provide 2160 kcals, 135 gm protein, 1460 ml free water daily.  Patient is currently intubated on ventilator support MV: 14.1 L/min Temp (24hrs), Avg:99.1 F (37.3 C), Min:98.3 F (36.8 C), Max:100.1 F (37.8 C)  Propofol: 11.5 ml/hr providing 304 kcal per day  Diet Order:  Diet NPO time specified  Skin:  Reviewed, no issues  Last BM:  11/6  Height:   Ht Readings from Last 1 Encounters:  08/18/16 5' 9" (1.753 m)    Weight:   Wt Readings from Last 1 Encounters:  08/14/16 200 lb (90.7 kg)    Ideal Body Weight:  72.7 kg  BMI:  29.5  Estimated Nutritional Needs:   Kcal:  2317  Protein:  125-140 gm  Fluid:  2.3 L  EDUCATION NEEDS:   No education needs identified at this time  Molli Barrows, [place], [place], [place] Pager [phone] After Hours Pager [phone]

## 2016-09-04 NOTE — Procedures (Signed)
HPI:  55 y/o male with MS change  TECHNICAL SUMMARY:  A multichannel referential and bipolar montage EEG using the standard international 10-20 system was performed on the patient described as awake but minimally responsive.  The dominant background activity consists of 7 hertz activity seen most prominantly over the posterior head region.  The backgound activity is nonreactive to eye opening and closing procedures.  Low voltage fast (beta) activity is distributed symmetrically and maximally over the anterior head regions.  ACTIVATION:  Stepwise photic stimulation and hyperventilation were not performed.  EPILEPTIFORM ACTIVITY:  There were no spikes, sharp waves or paroxysmal activity.  SLEEP:  No sleep  CARDIAC:  The EKG lead revealed a regular sinus rhythm.  IMPRESSION:  This is an abnormal EEG demonstrating a mild diffuse slowing of electrocerebral activity.  This can be seen in a wide variety of encephalopathic state including those of a toxic, metabolic, or degenerative nature.  There were no focal, hemispheric, or lateralizing features.  No epileptiform activity was recorded.

## 2016-09-04 NOTE — Care Management Note (Signed)
Case Management Note  Patient Details  Name: Bradley [name] MRN: [id number] Date of Birth: 01-24-1961  Subjective/Objective:     Pt admitted with resp distress                Action/Plan:  Pt is from home with wife.  Pt remains on ventilator, now with cold and mottling to LE.  CM will continue to follow for discharge needs   Expected Discharge Date:                  Expected Discharge Plan:     In-House Referral:     Discharge planning Services  CM Consult  Post Acute Care Choice:    Choice offered to:     DME Arranged:    DME Agency:     HH Arranged:    HH Agency:     Status of Service:  In process, will continue to follow  If discussed at Long Length of Stay Meetings, dates discussed:    Additional Comments: 09/04/2016  Pt trached today.  CM received LTACH referral from attending - CM left voicemail for Physician Advisor regarding appropriateness of LTACH referral, CSW already following for possible discharge to SNF.  CM spoke with wife regarding discharge disposition.  Wife stated she understood the need for a post acute facility at current condition.  Pt remains on ventilator via trach, IV antibiotics, IV steroids, Tube Feeds.  09/01/16 Pt is still not waking up - MRI negative.  Neuro and vascular consulting [name], [name] S, RN 09/04/2016, 2:08 PM

## 2016-09-04 NOTE — Procedures (Signed)
Name:  Bradley FraiseDavid M Castorena MRN:  [id number] DOB:  1961-01-05  OPERATIVE NOTE  Procedure:  Percutaneous tracheostomy.  Indications:  Ventilator-dependent respiratory failure.  Consent:  Procedure, alternatives, risks and benefits discussed with medical POA.  Questions answered.  Consent obtained.  Anesthesia:  Versed, fent, prop, vec  Procedure summary:  Appropriate equipment was assembled.  The patient was identified as Bradley Fraiseavid M Dimarco and safety timeout was performed. The patient was placed in supine position with a towel roll behind shoulder blades and neck extended.  Sterile technique was used. The patient's neck and upper chest were prepped using chlorhexidine / alcohol scrub and the field was draped in usual sterile fashion with full body drape. After the adequate sedation / anesthesia was achieved, attention was directed at the midline trachea, where the cricothyroid membrane was palpated. Approximately two fingerbreadths above the sternal notch, a verticle  incision was created with a scalpel after local infiltration with 0.2% Lidocaine. Then, using Seldinger technique and a percutaneous tracheostomy set, the trachea was entered with a 14 gauge needle with an overlying sheath. This was all confirmed under direct visualization of a fiberoptic flexible bronchoscope. Entrance into the trachea was identified through the third tracheal ring interspace. Following this, a guidewire was inserted. The needle was removed, leaving the sheath and the guidewire intact. Next, the sheath was removed and a small dilator was inserted. The tracheal rings were then dilated. A #5 Shiley was then opened. The balloon was checked. It was placed over a tracheal dilator, which was then advanced over the guidewire and through the previously dilated tract. The Shiley tracheostomy tube was noted to pass in the trachea with little resistance. The guidewire and dilator tubes were removed from the trachea. An inner cannula was  placed through the tracheostomy tube. The tracheostomy was then secured at the anterior neck with 4 monofilament sutures. The oral endotracheal tube was removed and the ventilator was attached to the newly placed tracheostomy tube. Adequate tidal volumes were noted. The cuff was inflated and no evidence of air leak was noted. No evidence of bleeding was noted. At this point, the procedure was concluded. Post-procedure chest x-ray was ordered.  Complications:  No immediate complications were noted.  Hemodynamic parameters and oxygenation remained stable throughout the procedure.  Estimated blood loss:  Less then 10 mL.  Nelda BucksFEINSTEIN,DANIEL J., MD Pulmonary and Critical Care Medicine [facility] Pager: [phone]  09/04/2016, 12:57 PM  Should follow up with [facility] SoursUncle Pete  Trach clinici [phone]

## 2016-09-04 NOTE — Progress Notes (Signed)
PULMONARY / CRITICAL CARE MEDICINE   Name: Bradley [name] MRN: [id number] DOB: 01/11/1961    ADMISSION DATE:  08/17/2016 CONSULTATION DATE:  08/19/16  REFERRING MD:  Dr Thayer Ohm Rama  CHIEF COMPLAINT:  Acute resp failure with hypoxemia  BRIEF 55 year old smoker, with PFT nov 2016 showing isolated reduction in dlco to 59% and quite functional and not on home o2. Followed by Dr Sherene Sires -0 last seen Sept/Nov 2016 based on CT chest evidence showing diffuse changes of centrilobular and paraseptal emphysema along with prominent interstitial markings primarily in the upper lobes consistent with pulmonary fibrosis. This was a non--UIP pattern. Patient recommended quitting smoking because of concerns of DIP. According to the patient and his wife, has continued to smoke. He has been in his baseline status of health and very functional. Then on 08/14/2016 he had a chest x-ray preoperative that only showed chronic changes without change for a right shoulder surgery rotator cuff tear. The procedure was uneventful. Postoperative course was uneventful. There was no vomiting. Then abruptly on 08/16/2016 he started getting acutely ill with shortness of breath and cough with brown colored sputum. Then admitted to the hospital. Initial white count was 13,000. BNP was normal. CT angiogram chest ruled out pulmonary embolism but showed diffuse ground glass opacities and enlarged mediastinal node measuring close to 2 cm. He was started on community acquired pneumonia antibiotics but has developed progressive acute respiratory failure necessitating Solu-Medrol since 08/18/2016 evening. And from the early hours of 08/19/2016 he's been on BiPAP with significant respiratory distress and therefore pulmonary critical care has been consulted.  At baseline he denies any mold exposure or mildew exposure or working with dust. He denies any birds in the house. He continues to smoke.   Events: 08/17/2016 - admit 08/18/2016-respiratory  virus panel, HIV and flu panel PCR negative 08/19/16 -bipap, move to icu -> intubated 10/21 10/22 repeat RVP from trach aspirate - negative 10/23 levo off 10/24 nimbex off 10/25 --> RR adjusted from 35 to 30 overnight due to respiratory acidosis. Did not improve gas, so RR decreased to 30 again. P/F ratios of 275 and 171, respectively.  10/26 --> ABGs improved on pressure control vent settings. FiO2 increased from 40 to 60 overnight due to desaturations.  10/29 - beginning to wean 10/30 - Fever to 100.8 F yesterday 10/31 - Fever to 100.9 F overnight. Not tolerating wean of sedation without becoming very asynchronous with vent. On 20 of PC above PEEP.  11/1- Fever to 101.9 this morning. Very obtunded. MRI brain obtained and negative.  11/2- OG tube out of place this morning. Off all sedating medications. EEG :  moderate generalized slowing of brain activity.  09/01/16 =Febrile overnight to 102.1. Still very obtunded without spontaneous movements. 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  Dx of toxic metabolic encephalopathy 09/02/16 - shaking head wich is an improvement but overall flaccid. 30% fio2. Spiked temp 102.19F despite antibiotics.  Na astill 150 09/03/16- Trach aspirate growing abundant GNR. Fever curve improved. More awake and following some command with increased limb movement.   SUBJECTIVE/OVERNIGHT/INTERVAL HX 09/04/16 - Afebrile overnight. More awake. Following some commands over the weekend. Per RN, not as interactive this morning. +Cough +gag.  For PDT by Dr Tyson Alias AM today.    VITAL SIGNS: BP 95/62 (BP Location: Left Arm)   Pulse 90   Temp 98.8 F (37.1 C) (Oral)   Resp (!) 22  Ht 5\' 9"  (1.753 m)   Wt 212 lb (96.2 kg)   SpO2 100%   BMI 31.31 kg/m   HEMODYNAMICS:    Lines: Foley 10/21 >> ETT and OG 10/21 >> CVC 10/22 >> PIV x 3 A-line left radial 10/22 >>>10/30  VENTILATOR  SETTINGS: Vent Mode: PCV FiO2 (%):  [30 %] 30 % Set Rate:  [16 bmp] 16 bmp PEEP:  [5 cmH20] 5 cmH20 Pressure Support:  [10 cmH20] 10 cmH20 Plateau Pressure:  [16 cmH20-19 cmH20] 16 cmH20  INTAKE / OUTPUT: I/O last 3 completed shifts: In: 4357.5 [I.V.:602.5; NG/GT:2955; IV Piggyback:800] Out: 2900 [Urine:2900]    PHYSICAL EXAMINATION: General:  Middle-aged, sedated, ETT in place Neuro: Eyes open. Follows some commands. +Cough. +Gag.  HEENT:  ETT in place Cardiovascular:  Regular rate and rhythm Lungs: Decreased BS at bases Abdomen:  Soft nontender Musculoskeletal:  No cyanosis no clubbing no edema. Looks euvolemic; clean dry bandage over R shoulder Skin:  Intact on exposed areas. R LE cool compared to L LE but with good cap refill and palpable pedal pulses. - improved 09/04/16  LABS:  PULMONARY  Recent Labs Lab 08/28/16 1000 08/29/16 0300  PHART 7.446 7.454*  PCO2ART 49.2* 47.4  PO2ART 89.0 82.4*  HCO3 33.4* 32.8*  O2SAT 96.6 95.8   CBC  Recent Labs Lab 09/02/16 0500 09/03/16 0505 09/04/16 0445  HGB 14.1 12.4* 11.1*  HCT 44.3 39.4 35.1*  WBC 29.4* 19.9* 17.1*  PLT 211 156 138*   COAGULATION  Recent Labs Lab 09/03/16 0505  INR 1.03   CARDIAC   No results for input(s): TROPONINI in the last 168 hours. No results for input(s): PROBNP in the last 168 hours.  CHEMISTRY  Recent Labs Lab 08/29/16 0327 08/30/16 0230 08/31/16 0658 09/01/16 0403 09/02/16 0500 09/03/16 0505  NA 146* 147* 150* 150* 150* 146*  K 4.3 4.2 3.7 3.6 3.5 3.5  CL 107 105 109 111 114* 111  CO2 33* 34* 34* 30 28 29   GLUCOSE 273* 200* 193* 149* 153* 197*  BUN 41* 41* 38* 44* 43* 30*  CREATININE 0.82 0.91 0.72 0.78 0.76 0.61  CALCIUM 8.2* 8.2* 8.7* 8.6* 8.1* 7.7*  MG 2.5* 2.4  --  2.8* 2.6* 2.4  PHOS 3.7 3.8  --  3.8 4.3 2.8   Estimated Creatinine Clearance: 119.4 mL/min (by C-G formula based on SCr of 0.61 mg/dL).  LIVER  Recent Labs Lab 09/01/16 0929 09/03/16 0505   AST 32  --   ALT 62  --   ALKPHOS 31*  --   BILITOT 0.7  --   PROT 5.3*  --   ALBUMIN 2.3*  --   INR  --  1.03   INFECTIOUS  Recent Labs Lab 09/01/16 0929 09/01/16 0930 09/01/16 1201 09/02/16 0005 09/02/16 0500 09/03/16 0505  LATICACIDVEN  --  1.3 2.4* 1.1  --   --   PROCALCITON 0.15  --   --   --  0.24 0.33   ENDOCRINE CBG (last 3)   Recent Labs  09/03/16 2009 09/04/16 0021 09/04/16 0421  GLUCAP 182* 165* 102*   IMAGING x48h  - image(s) personally visualized  -   highlighted in bold Dg Chest Port 1 View  Result Date: 09/03/2016 CLINICAL DATA:  Check endotracheal tube EXAM: PORTABLE CHEST 1 VIEW COMPARISON:  09/02/2016 FINDINGS: Cardiac shadow is stable. Right-sided PICC line is noted in satisfactory position at the cavoatrial junction. Nasogastric catheter and endotracheal tube are noted in satisfactory position. A right jugular  central line is been removed in the interval. The lungs show bibasilar atelectasis increased from the prior exam. No bony abnormality is seen. IMPRESSION: Bibasilar atelectatic changes. Electronically Signed   By: Alcide CleverMark  Lukens M.D.   On: 09/03/2016 07:13    Summary:  55-y/o male who developed acute respiratory distress after shoulder surgery likely 2/2 ILD but treated for possible infectious etiology. Moving towards extubation, but mental status has been a barrier.   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 No known ILD exposures per hx  #Admit 08/17/2016 - Abrupt respiratory illness with acute hypoxemic respiratory failure with significant deterioration 3 days after right shoulder surgery. Pulmonary embolism ruled out. Diffuse ground glass opacities on CT chest at admission  - Differential diagnosis includes: Undiagnosed IPF flare, acute lung injury postop  idiopathic  - Rx with ARDS net, nimbex through 08/22/16 and steroids (BAL 10/24 -non diag, normal autoimmne, RVP)  #Current 09/04/2016  - down to 30% fio2 but encephalopathy and myoneuropathy preventing extubation  P:   Solumedrol 30 daily (day 16), reduced 09/02/16 - would dc this by d21 Ipratroprium and xopenex q6h. Plan for trach week of 11/6 if unable to extubate  -Dr Tyson AliasFeinstein aware    [facility]16 Risks of pneumothorax, hemothorax, sedation/anesthesia complications such as cardiac or respiratory arrest or hypotension, stroke and bleeding all explained. Benefits of diagnosis but limitations of non-diagnosis also explained. Patient verbalized understanding and wished to proceed.     CARDIOVASCULAR A:  MI ruled out but troponin increased to 0.07. Suspect demand. ECHO ok 10/22 - started on levophed, discontinued 10/23   -nil acute   P:  Monitor Even balance goals to neg    RENAL A:   AKI resolved  09/03/16 - hypernatermia improved. Mild low K P:   Free water decrease Replace electrolytes as indicated. Follow BMET.   GASTROINTESTINAL A:   Nil acute P:   NPO except meds TFs per nutrition PPI  HEMATOLOGIC A:   At risk for anemia of critical illness Leukocytosis persistent, now improving   P:  Monitor - stable. DVT prophylaxis - lovenox  INFECTIOUS Blood Cx 11/3: NGTD Urine Cx 11/3: NGTD Trach Cx 11/3: Abundant GNR   A:   Unclear if this is community acquired pneumonia- treated  Fever curve improved with wbc  Improvement on 09/03/16 since antibiotics on 09/01/16  P:  Await panc culture 09/01/16 Follow fever curve WBC Tylenol for fevers  > 102F  Abx: Zosyn 10/21>>>10/30, 11/3 >>  Vanc 10/21>>>10/26, 11/3 >> 11/5  ENDOCRINE A:   At risk for hyperglycemia  - CBGs now well controlled  P:   Lantus 15u qhs and moderate SSI  Solumedrol 30 daily (day 16), reduced 09/02/16 - would dc this by d21  NEUROLOGIC A:   Toxic  metabolic encephalopathy with flaccidity 09/02/16 - critical illnes myoneuropathy   - agitated 11/5 so diprivan started, improvement in mental status     P:   Diprivan  Monitor RASS goal: -1 Correct metabolic issues above (fever and high Na) Avoid opiopids, benzoe  FAMILY  - no family at bedside 11/6   Marcy Sirenatherine Wallace, D.O. 09/04/2016, 6:43 AM PGY-2, [facility]    Attending:  I have seen and examined the patient with nurse practitioner/resident and agree with the note above.  We formulated the  plan together and I elicited the following history.    Bradley [name] is opening his eyes to voice but not consistently following commands Minimal vent settings  On exam Lungs suprisingly clear, vent supported breaths Belly soft, notnende  Multiple images reviewed, CT on admission with emphysema, ggo, and consolidation, most recent CXR (today, improved aeration)  Acute on chronic respiratory failure due to ILD flare up> wean prednisone, trach today, then wean vent Acute encephalopathy with weakness, neurology support appreciated> repeat MRI brain, PT consult post trache Hypernatremia> continue free water  I updated wife  Cc time by me 33 minutes  Heber CarolinaBrent McQuaid, MD [facility] Pager: [phone] Cell: [phone] After 3pm or if no response, call [phone]

## 2016-09-04 NOTE — Procedures (Signed)
Bedside Tracheostomy Insertion Procedure Note   Patient Details:   Name: Bradley FraiseDavid M Aulds DOB: 1961-03-22 MRN: [id number]  Procedure: Tracheostomy  Pre Procedure Assessment: ET Tube Size:7.5 ET Tube secured at lip (cm):23 Bite block in place: No Breath Sounds: Clear  Post Procedure Assessment: BP 116/74   Pulse 86   Temp 100.1 F (37.8 C) (Oral)   Resp 15   Ht 5\' 9"  (1.753 m)   Wt 212 lb (96.2 kg)   SpO2 100%   BMI 31.31 kg/m  O2 sats: stable throughout Complications: No apparent complications Patient did tolerate procedure well Tracheostomy Brand:Shiley Tracheostomy Style:Cuffed Tracheostomy Size: 6.0 Tracheostomy Secured WUJ:[id number]:Sutures Tracheostomy Placement Confirmation:Trach cuff visualized and in place    [name], [name] A Rosie Placeands 09/04/2016, 1:05 PM

## 2016-09-04 NOTE — Progress Notes (Signed)
EEG Completed; Results Pending  

## 2016-09-04 NOTE — Progress Notes (Signed)
Subjective: Awake, but still encephalopathic.   Exam: Vitals:   09/04/16 0719 09/04/16 0800  BP:  104/73  Pulse:    Resp:  18  Temp: 100 F (37.8 C)    Gen: In bed, NAD Resp: non-labored breathing, no acute distress Abd: soft, nt  Neuro: MS: awake, does not follow commands.  CN: R pupil larger than left, does nto clearly cross midline to the left.  Motor: withdraws minimally in UE< significant weakness in LE Sensory:responds to nox stim.   Pertinent Labs: Improving NA  Impression: 55 yo M admitted with respiratory failure who then developaed severe encephalopathy. Pupillary abnoramlities have not been previously documented and  I feel this needs to be further investigated. Most likely this is an underlying mild anisocoria with gram negative associated encephaloapthy, but with continued AMS, will investigate further.    Recommendations: 1) Repeat MRI brain 2) EEG. 3) will continue to follow.   Ritta SlotMcNeill Jesusmanuel Erbes, MD [facility] [phone]  If 7pm- 7am, please page neurology on call as listed in AMION.

## 2016-09-05 LAB — CBC
HEMATOCRIT: 34.5 % — AB (ref 39.0–52.0)
HEMATOCRIT: 36.4 % — AB (ref 39.0–52.0)
Hemoglobin: 10.8 g/dL — ABNORMAL LOW (ref 13.0–17.0)
Hemoglobin: 11.5 g/dL — ABNORMAL LOW (ref 13.0–17.0)
MCH: 29.5 pg (ref 26.0–34.0)
MCH: 29.7 pg (ref 26.0–34.0)
MCHC: 31.3 g/dL (ref 30.0–36.0)
MCHC: 31.6 g/dL (ref 30.0–36.0)
MCV: 94.3 fL (ref 78.0–100.0)
MCV: 94.1 fL (ref 78.0–100.0)
PLATELETS: 134 10*3/uL — AB (ref 150–400)
Platelets: 142 10*3/uL — ABNORMAL LOW (ref 150–400)
RBC: 3.66 MIL/uL — AB (ref 4.22–5.81)
RBC: 3.87 MIL/uL — ABNORMAL LOW (ref 4.22–5.81)
RDW: 15.3 % (ref 11.5–15.5)
RDW: 15.3 % (ref 11.5–15.5)
WBC: 14.8 10*3/uL — ABNORMAL HIGH (ref 4.0–10.5)
WBC: 15.1 10*3/uL — ABNORMAL HIGH (ref 4.0–10.5)

## 2016-09-05 LAB — MAGNESIUM: Magnesium: 2.5 mg/dL — ABNORMAL HIGH (ref 1.7–2.4)

## 2016-09-05 LAB — GLUCOSE, CAPILLARY
GLUCOSE-CAPILLARY: 209 mg/dL — AB (ref 65–99)
GLUCOSE-CAPILLARY: 230 mg/dL — AB (ref 65–99)
Glucose-Capillary: 219 mg/dL — ABNORMAL HIGH (ref 65–99)
Glucose-Capillary: 180 mg/dL — ABNORMAL HIGH (ref 65–99)
Glucose-Capillary: 179 mg/dL — ABNORMAL HIGH (ref 65–99)
Glucose-Capillary: 163 mg/dL — ABNORMAL HIGH (ref 65–99)
Glucose-Capillary: 164 mg/dL — ABNORMAL HIGH (ref 65–99)

## 2016-09-05 LAB — PHOSPHORUS: Phosphorus: 3 mg/dL (ref 2.5–4.6)

## 2016-09-05 LAB — BASIC METABOLIC PANEL
Anion gap: 9 (ref 5–15)
BUN: 23 mg/dL — ABNORMAL HIGH (ref 6–20)
CHLORIDE: 111 mmol/L (ref 101–111)
CO2: 27 mmol/L (ref 22–32)
CREATININE: 0.57 mg/dL — AB (ref 0.61–1.24)
Calcium: 7.8 mg/dL — ABNORMAL LOW (ref 8.9–10.3)
GFR calc non Af Amer: >60 mL/min (ref >60)
Glucose, Bld: 216 mg/dL — ABNORMAL HIGH (ref 65–99)
POTASSIUM: 4 mmol/L (ref 3.5–5.1)
Sodium: 147 mmol/L — ABNORMAL HIGH (ref 135–145)

## 2016-09-05 MED ORDER — INSULIN GLARGINE 100 UNIT/ML ~~LOC~~ SOLN
18.0000 [IU] | Freq: Every day | SUBCUTANEOUS | Status: DC
  Administered 2016-09-05 – 2016-09-08 (×3): 18 [IU] via SUBCUTANEOUS
  Filled 2016-09-05 (×4): qty 0.18

## 2016-09-05 MED ORDER — FLUCONAZOLE IN SODIUM CHLORIDE 200-0.9 MG/100ML-% IV SOLN
200.0000 mg | INTRAVENOUS | Status: AC
  Administered 2016-09-05 – 2016-09-08 (×4): 200 mg via INTRAVENOUS
  Filled 2016-09-05 (×5): qty 100

## 2016-09-05 MED ORDER — ENOXAPARIN SODIUM 40 MG/0.4ML ~~LOC~~ SOLN: 40.0000 mg | SUBCUTANEOUS | Status: DC

## 2016-09-05 NOTE — Progress Notes (Signed)
Subjective: Looks more awake today  Exam: Vitals:   09/05/16 1600 09/05/16 1700  BP: 112/73 113/77  Pulse:    Resp: (!) 23 (!) 26  Temp: 98.8 F (37.1 C)    Gen: In bed, NAD Resp: non-labored breathing, no acute distress Abd: soft, nt  Neuro: MS: awake, does not follow commands. He does not his head and shake his head, but not clearly reliably. CN: R pupil larger than left, He tracks across midline both directions.  Motor: withdraws minimally in UE, significant weakness in LE Sensory:responds to nox stim.   Pertinent Labs: Improving NA  Impression: 55 yo M admitted with respiratory failure who then developaed severe encephalopathy. It continues to be possible that this represents a encephalopathy due to gram-negative infection and steroids. His profound weakness is concerning, and though it appears most consistent with peripheral etiology (EEG critical care myoneuropathy) I would favor ruling out cervical injury as well.  Recommendations: 1) MRI cervical spine 2) will continue to follow.   Ritta SlotMcNeill Wilkie Zenon, MD [facility] [phone]  If 7pm- 7am, please page neurology on call as listed in AMION.

## 2016-09-05 NOTE — Progress Notes (Signed)
Inpatient Diabetes Program Recommendations  AACE/ADA: New Consensus Statement on Inpatient Glycemic Control (2015)  Target Ranges:  Prepandial:   less than 140 mg/dL      Peak postprandial:   less than 180 mg/dL (1-2 hours)      Critically ill patients:  140 - 180 mg/dL   Lab Results  Component Value Date   GLUCAP 209 (H) 09/05/2016    Review of Glycemic Control:  Results for [name], Bradley [name] (MRN [id number]) as of 09/05/2016 11:13  Ref. Range 09/04/2016 23:43 09/05/2016 03:44 09/05/2016 07:36  Glucose-Capillary Latest Ref Range: 65 - 99 mg/dL 409180 (H) 811219 (H) 914209 (H)   Inpatient Diabetes Program Recommendations:    Note blood sugars trending up with tube feeds.  Consider adding tube feed coverage 3 units q 4 hours (hold if tube feeds held).  Thanks, Beryl MeagerJenny Cynthie Garmon, RN, BC-ADM Inpatient Diabetes Coordinator Pager [phone] (8a-5p)

## 2016-09-05 NOTE — Procedures (Signed)
PCCM Video Bronchoscopy Procedure Note  The patient was informed of the risks (including but not limited to bleeding, infection, respiratory failure, lung injury, tooth/oral injury) and benefits of the procedure and gave consent, see chart.  Indication: facilitate percutaneous tracheostomy   Post Procedure Diagnosis: Same  [facility]  Condition pre procedure: critically ill on vent  Medications for procedure: Propofol drip, fentanyl, versed, vecuronium  Procedure description: The bronchoscope was introduced through the endotracheal tube and passed to the bilateral lungs to the level of the mainstem bronchi throughout the tracheobronchial tree.  Airway exam revealed some secretions in trachea but thin and non-obstructing, the bilateral mainstem bronchi.  Procedures performed: none  Specimens sent: none  Condition post procedure: critically ill, on vent  EBL: none from bronchoscopy  Complications: none immediate  Heber CarolinaBrent McQuaid, MD [place] [facility] Pager: [phone] Cell: [phone] After 3pm or if no response, call [phone]

## 2016-09-05 NOTE — Evaluation (Signed)
Physical Therapy Evaluation Patient Details Name: Bradley [name] MRN: [id number] DOB: Apr 01, 1961 Today's Date: 09/05/2016   History of Present Illness  55 yo admitted on 10/19 with SOB after RCR and bicep tendon repair on 10/16. Intubated 10/21 with trach 11/6, metabolic encephalopathy. PMhx: asthma, COPD, pulmonary fibrosis  Clinical Impression  Pt lethargic in bed, trached on vent at 30%. Pt with eyes open, no tracking noted. No AROM bil UE or bil LE and not responding to commands for grip or movement, noted withdrawal to painful stimuli bil LE. Pt with encephalopathy, generalized weakness and myopathy due to medical complications and length of stay who will benefit from acute therapy to maximize strength, balance and function to decrease burden of care. Recommend daily ROm with nursing assist. Pt with blood oozing from his mouth with sitting EOB today limiting ability to mobilize and assess balance. Dr.McQuaid present to suction and assess pt.   110/78     Follow Up Recommendations LTACH;Supervision/Assistance - 24 hour    Equipment Recommendations  Other (comment) (will defer to next venue)    Recommendations for Other Services OT consult     Precautions / Restrictions Precautions Precautions: Fall Precaution Comments: vent, panda, trach Restrictions Weight Bearing Restrictions: No      Mobility  Bed Mobility Overal bed mobility: Needs Assistance;+2 for physical assistance Bed Mobility: Sit to Supine;Supine to Sit     Supine to sit: Total assist;+2 for physical assistance;HOB elevated Sit to supine: Total assist;+2 for physical assistance   General bed mobility comments: total assist to pivot to EOB with 2 person assist. once EOB pt with blood out of his mouth and returned to supine with total assist. Pt grossly 30 sec EOB  Transfers                    Ambulation/Gait                Stairs            Wheelchair Mobility    Modified Rankin  (Stroke Patients Only)       Balance Overall balance assessment: Needs assistance   Sitting balance-Leahy Scale: Zero Sitting balance - Comments: EOB grossly 30 sec, unable to attempt to prop with bil UE                                     Pertinent Vitals/Pain Pain Assessment:  (CPOT= 0)    Home Living Family/patient expects to be discharged to:: Other (Comment) (LTACH) Living Arrangements: Spouse/significant other Available Help at Discharge: Family;Available 24 hours/day Type of Home: House Home Access: Stairs to enter   [employer] CorporationEntrance Stairs-Number of Steps: 5 Home Layout: One level Home Equipment: None      Prior Function Level of Independence: Independent               Hand Dominance        Extremity/Trunk Assessment   Upper Extremity Assessment: Generalized weakness (pt without AROM bil UE. gross ROM appears functional. did not assess RUE given recent sx)           Lower Extremity Assessment: Generalized weakness (PROM bil WFL, no AROM noted at all . pt noted to have withdrawal bil feet to painful stimuli)         Communication   Communication: Tracheostomy  Cognition Arousal/Alertness: Lethargic Behavior During Therapy: Flat affect Overall Cognitive Status: Difficult to assess  General Comments      Exercises     Assessment/Plan    PT Assessment Patient needs continued PT services  PT Problem List Decreased strength;Decreased mobility;Decreased activity tolerance;Decreased balance;Cardiopulmonary status limiting activity;Decreased cognition          PT Treatment Interventions DME instruction;Therapeutic activities;Cognitive remediation;Therapeutic exercise;Patient/family education;Balance training;Functional mobility training;Neuromuscular re-education    PT Goals (Current goals can be found in the Care Plan section)  Acute Rehab PT Goals Patient Stated Goal: be able to move and care for  himself PT Goal Formulation: With family Time For Goal Achievement: 09/19/16 Potential to Achieve Goals: Fair    Frequency Min 2X/week   Barriers to discharge        Co-evaluation               End of Session   Activity Tolerance: Patient tolerated treatment well Patient left: in bed;with call bell/phone within reach;with family/visitor present;with nursing/sitter in room Nurse Communication: Mobility status;Precautions         Time: [id number] PT Time Calculation (min) (ACUTE ONLY): 20 min   Charges:   PT Evaluation $PT Eval High Complexity: 1 Procedure     PT G CodesDelorse Lek:        Tabor, Zahli Vetsch Beth 09/05/2016, 11:05 AM  Delaney MeigsMaija Tabor Astria Jordahl, PT [phone]

## 2016-09-05 NOTE — Progress Notes (Signed)
PULMONARY / CRITICAL CARE MEDICINE   Name: Bradley [name] MRN: [id number] DOB: [birth year]    ADMISSION DATE:  08/17/2016 CONSULTATION DATE:  08/19/16  REFERRING MD:  Dr Thayer Ohm Rama  CHIEF COMPLAINT:  Acute resp failure with hypoxemia  BRIEF 55 year old smoker, with PFT nov 2016 showing isolated reduction in dlco to 59% and quite functional and not on home o2. Followed by Dr Sherene Sires -0 last seen Sept/Nov 2016 based on CT chest evidence showing diffuse changes of centrilobular and paraseptal emphysema along with prominent interstitial markings primarily in the upper lobes consistent with pulmonary fibrosis. This was a non--UIP pattern. Patient recommended quitting smoking because of concerns of DIP. According to the patient and his wife, has continued to smoke. He has been in his baseline status of health and very functional. Then on 08/14/2016 he had a chest x-ray preoperative that only showed chronic changes without change for a right shoulder surgery rotator cuff tear. The procedure was uneventful. Postoperative course was uneventful. There was no vomiting. Then abruptly on 08/16/2016 he started getting acutely ill with shortness of breath and cough with brown colored sputum. Then admitted to the hospital. Initial white count was 13,000. BNP was normal. CT angiogram chest ruled out pulmonary embolism but showed diffuse ground glass opacities and enlarged mediastinal node measuring close to 2 cm. He was started on community acquired pneumonia antibiotics but has developed progressive acute respiratory failure necessitating Solu-Medrol since 08/18/2016 evening. And from the early hours of 08/19/2016 he's been on BiPAP with significant respiratory distress and therefore pulmonary critical care has been consulted.  At baseline he denies any mold exposure or mildew exposure or working with dust. He denies any birds in the house. He continues to smoke.   Events: 08/17/2016 - admit 08/18/2016-respiratory  virus panel, HIV and flu panel PCR negative 08/19/16 -bipap, move to icu -> intubated 10/21 10/22 repeat RVP from trach aspirate - negative 10/23 levo off 10/24 nimbex off 10/25 --> RR adjusted from 35 to 30 overnight due to respiratory acidosis. Did not improve gas, so RR decreased to 30 again. P/F ratios of 275 and 171, respectively.  10/26 --> ABGs improved on pressure control vent settings. FiO2 increased from 40 to 60 overnight due to desaturations.  10/29 - beginning to wean 10/30 - Fever to 100.8 F yesterday 10/31 - Fever to 100.9 F overnight. Not tolerating wean of sedation without becoming very asynchronous with vent. On 20 of PC above PEEP.  11/1- Fever to 101.9 this morning. Very obtunded. MRI brain obtained and negative.  11/2- OG tube out of place this morning. Off all sedating medications. EEG :  moderate generalized slowing of brain activity.  09/01/16 =Febrile overnight to 102.1. Still very obtunded without spontaneous movements. 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  Dx of toxic metabolic encephalopathy 09/02/16 - shaking head wich is an improvement but overall flaccid. 30% fio2. Spiked temp 102.70F despite antibiotics.  Na astill 150 09/03/16- Trach aspirate growing abundant GNR. Fever curve improved. More awake and following some command with increased limb movement.  09/04/16: PDT placed. Repeat MRI for continued encephalopathy: no acute infarct or intracranial hemorrhage. EEG repeated with mild diffuse slowing of electrocerebral activity without epileptiform activity.   SUBJECTIVE/OVERNIGHT/INTERVAL HX 11/67/17- Afebrile overnight. Has been responsive to voice but not to commands. Weaning off vent well per nursing.   VITAL SIGNS: BP 103/73 (BP Location: Left Arm)   Pulse  90   Temp 99.5 F (37.5 C) (Oral)   Resp (!) 24   Ht 5\' 9"  (1.753 m)   Wt 212 lb (96.2 kg)   SpO2 97%   BMI 31.31 kg/m    HEMODYNAMICS:    Lines: Foley 10/21 >> ETT and OG 10/21 >>11/6 PDT 11/6 >>  CVC 10/22 >> PIV x 3 A-line left radial 10/22 >>>10/30  VENTILATOR SETTINGS: Vent Mode: PCV FiO2 (%):  [30 %-100 %] 30 % Set Rate:  [16 bmp-20 bmp] 20 bmp PEEP:  [5 cmH20] 5 cmH20 Plateau Pressure:  [16 cmH20-21 cmH20] 17 cmH20  INTAKE / OUTPUT: I/O last 3 completed shifts: In: 2342.6 [I.V.:837.6; NG/GT:1230; IV Piggyback:275] Out: 1975 [Urine:1975]    PHYSICAL EXAMINATION: General:  Middle-aged, sedated, ETT in place Neuro: Eyes open. Nods to voice. +Cough. +Gag. Does not follow commands. HEENT:  Trach in place.  Cardiovascular:  Regular rate and rhythm Lungs: Decreased BS at bases Abdomen:  Soft nontender Musculoskeletal:  No cyanosis no clubbing no edema. Looks euvolemic; clean dry bandage over R shoulder Skin:  Intact on exposed areas. Extremities with good pedal pulses and equal in warmth.  LABS:  PULMONARY No results for input(s): PHART, PCO2ART, PO2ART, HCO3, TCO2, O2SAT in the last 168 hours.  Invalid input(s): PCO2, PO2 CBC  Recent Labs Lab 09/03/16 0505 09/04/16 0445 09/05/16 0340  HGB 12.4* 11.1* 10.8*  HCT 39.4 35.1* 34.5*  WBC 19.9* 17.1* 14.8*  PLT 156 138* 134*   COAGULATION  Recent Labs Lab 09/03/16 0505  INR 1.03   CARDIAC   No results for input(s): TROPONINI in the last 168 hours. No results for input(s): PROBNP in the last 168 hours.  CHEMISTRY  Recent Labs Lab 09/01/16 0403 09/02/16 0500 09/03/16 0505 09/04/16 0445 09/05/16 0340  NA 150* 150* 146* 148* 147*  K 3.6 3.5 3.5 3.8 4.0  CL 111 114* 111 113* 111  CO2 30 28 29 29 27   GLUCOSE 149* 153* 197* 92 216*  BUN 44* 43* 30* 25* 23*  CREATININE 0.78 0.76 0.61 0.59* 0.57*  CALCIUM 8.6* 8.1* 7.7* 7.8* 7.8*  MG 2.8* 2.6* 2.4 2.4 2.5*  PHOS 3.8 4.3 2.8 3.2 3.0   Estimated Creatinine Clearance: 119.4 mL/min (by C-G formula based on SCr of 0.57 mg/dL (L)).  LIVER  Recent Labs Lab  09/01/16 0929 09/03/16 0505  AST 32  --   ALT 62  --   ALKPHOS 31*  --   BILITOT 0.7  --   PROT 5.3*  --   ALBUMIN 2.3*  --   INR  --  1.03   INFECTIOUS  Recent Labs Lab 09/01/16 0929 09/01/16 0930 09/01/16 1201 09/02/16 0005 09/02/16 0500 09/03/16 0505  LATICACIDVEN  --  1.3 2.4* 1.1  --   --   PROCALCITON 0.15  --   --   --  0.24 0.33   ENDOCRINE CBG (last 3)   Recent Labs  09/04/16 1459 09/04/16 1948 09/05/16 0344  GLUCAP 123* 122* 219*   IMAGING x48h  - image(s) personally visualized  -   highlighted in bold Mr Brain W Wo Contrast  Result Date: 09/04/2016 CLINICAL DATA:  No acute intracranial abnormality as noted above. EXAM: MRI HEAD WITHOUT AND WITH CONTRAST TECHNIQUE: Multiplanar, multiecho pulse sequences of the brain and surrounding structures were obtained without and with intravenous contrast. CONTRAST:  20mL MULTIHANCE GADOBENATE DIMEGLUMINE 529 MG/ML IV SOLN COMPARISON:  08/30/2016 brain MR. 08/23/2016 head CT. FINDINGS: Brain: No acute infarct or intracranial  hemorrhage. 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 This appears to partially enhancement and therefore not a simple cyst. This is not causing significant compression of the superior colliculus currently. This can be assessed on follow-up MR in 3-6 months to confirm stability or sooner if the patient develops diplopia with upward gaze indicating compression of the superior colliculus. Mild chronic microvascular changes. Mild global atrophy without hydrocephalus. Partially empty sella without expansion. No MR evidence of encephalitis/cerebritis. No abnormal leptomeningeal enhancement as can be seen with meningitis. If this were of concern further investigation would be necessary. Vascular: Major intracranial vascular structures are patent. Skull and upper cervical spine: Negative. Sinuses/Orbits: No acute orbital abnormality. Other: Minimal paranasal sinus mucosal thickening. Partial opacification mastoid air cells.  Pooling of secretions upper pharynx without eustachian tube obstructing lesion. IMPRESSION: No acute infarct or intracranial hemorrhage. 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 This appears to partially enhancement and therefore not a simple cyst. This is not causing significant compression of the superior colliculus currently. This can be assessed on follow-up MR in 3-6 months to confirm stability or sooner if the patient develops diplopia with upward gaze indicating compression of the superior colliculus. Mild chronic microvascular changes. Mild global atrophy without hydrocephalus. Electronically Signed   By: Lacy Duverney M.D.   On: 09/04/2016 18:39   Dg Chest Port 1 View  Result Date: 09/04/2016 CLINICAL DATA:  Acute respiratory failure. EXAM: PORTABLE CHEST 1 VIEW COMPARISON:  09/04/2016 at 0458 hours. FINDINGS: Interval extubation with placement of a tracheostomy. Right PICC tip projects over the SVC. Heart size normal. Mild interstitial prominence and indistinctness are seen diffusely. No definite pleural fluid. IMPRESSION: Mild diffuse interstitial prominence and indistinctness may be due to slightly lower lung volumes than seen on examination performed earlier the same day. Difficult to exclude edema. Electronically Signed   By: Leanna Battles M.D.   On: 09/04/2016 13:41   Dg Chest Port 1 View  Result Date: 09/04/2016 CLINICAL DATA:  Ventilator dependence. EXAM: PORTABLE CHEST 1 VIEW COMPARISON:  09/03/2016 FINDINGS: Patient slightly rotated to the left. Endotracheal tube 4.6 cm above the carina. Nasogastric tube tip not visualized. The right-sided PICC line with tip over the lower SVC just below the carina. Lungs are adequately inflated with subtle bibasilar density suggesting atelectasis unchanged to slightly improved. Cardiomediastinal silhouette and remainder of the exam is unchanged. IMPRESSION: Subtle bibasilar density unchanged to slightly improved likely atelectasis. Tubes and lines as  described. Electronically Signed   By: Elberta Fortis M.D.   On: 09/04/2016 07:11   Dg Abd Portable 1v  Result Date: 09/04/2016 CLINICAL DATA:  Encounter for nasogastric tube placement. EXAM: PORTABLE ABDOMEN - 1 VIEW COMPARISON:  08/31/2016 FINDINGS: Feeding tube is in the right mid abdomen and expected location of the descending duodenum. Nonobstructive bowel gas pattern. Question some densities in the medial left lung base. IMPRESSION: Feeding tube is in the descending duodenum. Electronically Signed   By: Richarda Overlie M.D.   On: 09/04/2016 17:24    Summary:  55-y/o male who developed acute respiratory distress after shoulder surgery likely 2/2 ILD but treated for possible infectious etiology. Moving towards extubation, but mental status has been a barrier.   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 No known ILD exposures per hx  #Admit 08/17/2016 - Abrupt respiratory illness with acute  hypoxemic respiratory failure with significant deterioration 3 days after right shoulder surgery. Pulmonary embolism ruled out. Diffuse ground glass opacities on CT chest at admission  - Differential diagnosis includes: Undiagnosed IPF flare, acute lung injury postop idiopathic  - Rx with ARDS net, nimbex through 08/22/16 and steroids (BAL 10/24 -non diag, normal autoimmne, RVP)  #Current 09/05/2016  - down to 30% fio2 but encephalopathy and myoneuropathy preventing extubation >> trach now in place   P:   Solumedrol 20 daily (day 17), reduced 09/04/16 - would dc this by d21 Ipratroprium and xopenex q6h. Wean off vent with trach     CARDIOVASCULAR A:  MI ruled out but troponin increased to 0.07. Suspect demand. ECHO ok 10/22 - started on levophed, discontinued 10/23   -nil acute   P:  Monitor Even balance goals to  neg    RENAL A:   AKI resolved  09/05/16 - hypernatermia stable  P:   Free water  Replace electrolytes as indicated. Follow BMET.   GASTROINTESTINAL A:   Nil acute P:   NPO except meds TFs per nutrition PPI  HEMATOLOGIC A:   At risk for anemia of critical illness Leukocytosis persistent, now improving   P:  Monitor - stable. DVT prophylaxis - lovenox  INFECTIOUS Blood Cx 11/3: NGTD Urine Cx 11/3: NGTD Trach Cx 11/3: Abundant GNR   A:   Unclear if this is community acquired pneumonia- treated Fever curve and leukocytosis improving   P:  Await panc culture 09/01/16 Follow fever curve WBC Tylenol for fevers  > 102F  Abx: Zosyn 10/21>>>10/30, 11/3 >>  Vanc 10/21>>>10/26, 11/3 >> 11/5  ENDOCRINE A:   At risk for hyperglycemia  P:   Lantus 15u qhs and moderate SSI  Solumedrol 20 daily (day 17), reduced 09/04/16 - would dc this by d21  NEUROLOGIC A:   Toxic metabolic encephalopathy with flaccidity 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  Pineal lesion no causing significant compression of superior colliculus. Radiology recommends repeat MRI in 3-6 months to confirm stability of sooner if patient develops diplopia with upward gaze.     P:   Diprivan  Monitor RASS goal: -1 Correct metabolic issues above (fever and high Na) Avoid opiopids, benzoe  FAMILY  - no family at bedside 11/7   Marcy Sirenatherine Wallace, D.O. 09/05/2016, 6:49 AM PGY-2, [facility]  Attending:  I have seen and examined the patient with nurse practitioner/resident and agree with the note above.  We formulated the plan together and I elicited the following history.    Trach yesterday unremarkable On sedation overnight  On exam Hemoptysis, mild Belly soft, nontender Not following commands  MRI> no acute intracranial abnormalities, pineal lesion noted, rec f/u EEG > unremarkable  Labs > sodium about the same, Cr  stable, WBC down  ID> enterobacter from trach culture> overall condition improving, stop zosyn after today's dose Edema arms/hands> hold free water Hypernatremia> repeat BMET AM ILD> pred taper to end this week Acute on chronic respiratory failure> wean off vent as tolerated Hemoptysis this morning? Suspect related to trach, no tracheal blood on my exam, repeat CBC, monitor  CC time 35 minutes  Heber CarolinaBrent Selene Peltzer, MD [place] [facility] Pager: [phone] Cell: [phone] After 3pm or if no response, call [phone]

## 2016-09-05 NOTE — Care Management Note (Addendum)
Case Management Note  Patient Details  Name: Bradley [name] MRN: [id number] Date of Birth: Nov 02, 1960  Subjective/Objective:     Pt admitted with resp distress                Action/Plan:  Pt is from home with wife.  Pt remains on ventilator, now with cold and mottling to LE.  CM will continue to follow for discharge needs   Expected Discharge Date:                  Expected Discharge Plan:     In-House Referral:     Discharge planning Services  CM Consult  Post Acute Care Choice:    Choice offered to:     DME Arranged:    DME Agency:     HH Arranged:    HH Agency:     Status of Service:  In process, will continue to follow  If discussed at Long Length of Stay Meetings, dates discussed:    Additional Comments: 09/05/2016  Per physician advisor pt is appropriate for [facility] - discussed at LOS 09/05/16 and referrals given to both agencies.  CM offered pts wife choice between [facility] and [facility]- wife immediately chose [facility] due to location preference.  Wife is in agreement to meet with [facility] only. CM informed wife that [facility] will attempt insurance auth however if not approved [facility] can be pursued - wife agreed to this plan.  [facility] liaison met with wife and will initiate insurance auth  116/17 Pt trached today.  CM received LTACH referral from attending - CM left voicemail for Physician Advisor regarding appropriateness of LTACH referral, CSW already following for possible discharge to SNF.  CM spoke with wife regarding discharge disposition.  Wife stated she understood the need for a post acute facility at current condition.  Pt remains on ventilator via trach, IV antibiotics, IV steroids, Tube Feeds.  09/01/16 Pt is still not waking up - MRI negative.  Neuro and vascular consulting Maryclare Labrador, RN 09/05/2016, 9:12 AM

## 2016-09-06 ENCOUNTER — Inpatient Hospital Stay (HOSPITAL_COMMUNITY): Payer: BLUE CROSS/BLUE SHIELD

## 2016-09-06 LAB — GLUCOSE, CAPILLARY
GLUCOSE-CAPILLARY: 106 mg/dL — AB (ref 65–99)
GLUCOSE-CAPILLARY: 96 mg/dL (ref 65–99)
Glucose-Capillary: 190 mg/dL — ABNORMAL HIGH (ref 65–99)
Glucose-Capillary: 197 mg/dL — ABNORMAL HIGH (ref 65–99)
Glucose-Capillary: 209 mg/dL — ABNORMAL HIGH (ref 65–99)
Glucose-Capillary: 260 mg/dL — ABNORMAL HIGH (ref 65–99)

## 2016-09-06 LAB — BASIC METABOLIC PANEL
Anion gap: 7 (ref 5–15)
BUN: 22 mg/dL — AB (ref 6–20)
CO2: 25 mmol/L (ref 22–32)
CREATININE: 0.47 mg/dL — AB (ref 0.61–1.24)
Calcium: 8 mg/dL — ABNORMAL LOW (ref 8.9–10.3)
Chloride: 111 mmol/L (ref 101–111)
GFR calc Af Amer: >60 mL/min (ref >60)
GLUCOSE: 212 mg/dL — AB (ref 65–99)
POTASSIUM: 3.9 mmol/L (ref 3.5–5.1)
Sodium: 143 mmol/L (ref 135–145)

## 2016-09-06 LAB — CSF CELL COUNT WITH DIFFERENTIAL
RBC COUNT CSF: 78 /mm3 — AB
Tube #: 1
WBC CSF: 1 /mm3 (ref 0–5)

## 2016-09-06 LAB — CBC
HEMATOCRIT: 34.9 % — AB (ref 39.0–52.0)
Hemoglobin: 11.2 g/dL — ABNORMAL LOW (ref 13.0–17.0)
MCH: 30.2 pg (ref 26.0–34.0)
MCHC: 32.1 g/dL (ref 30.0–36.0)
MCV: 94.1 fL (ref 78.0–100.0)
PLATELETS: 143 10*3/uL — AB (ref 150–400)
RBC: 3.71 MIL/uL — ABNORMAL LOW (ref 4.22–5.81)
RDW: 15.4 % (ref 11.5–15.5)
WBC: 14.4 10*3/uL — ABNORMAL HIGH (ref 4.0–10.5)

## 2016-09-06 LAB — TRIGLYCERIDES: Triglycerides: 134 mg/dL (ref <150)

## 2016-09-06 LAB — PROTEIN, CSF: Total  Protein, CSF: 41 mg/dL (ref 15–45)

## 2016-09-06 LAB — CULTURE, BLOOD (ROUTINE X 2)
CULTURE: NO GROWTH
CULTURE: NO GROWTH

## 2016-09-06 LAB — PHOSPHORUS: Phosphorus: 3.2 mg/dL (ref 2.5–4.6)

## 2016-09-06 LAB — GLUCOSE, CSF: GLUCOSE CSF: 97 mg/dL — AB (ref 40–70)

## 2016-09-06 LAB — MAGNESIUM: MAGNESIUM: 2.4 mg/dL (ref 1.7–2.4)

## 2016-09-06 MED ORDER — ENOXAPARIN SODIUM 40 MG/0.4ML ~~LOC~~ SOLN
40.0000 mg | SUBCUTANEOUS | Status: DC
  Filled 2016-09-06: qty 0.4

## 2016-09-06 MED ORDER — LIDOCAINE HCL (PF) 1 % IJ SOLN
5.0000 mL | Freq: Once | INTRAMUSCULAR | Status: AC
  Administered 2016-09-06: 5 mL via INTRADERMAL
  Administered 2016-09-06: 13:00:00 via INTRADERMAL

## 2016-09-06 MED ORDER — ENOXAPARIN SODIUM 40 MG/0.4ML ~~LOC~~ SOLN: 40.0000 mg | SUBCUTANEOUS | Status: DC

## 2016-09-06 MED ORDER — PREDNISONE 10 MG PO TABS
10.0000 mg | ORAL_TABLET | Freq: Every day | ORAL | Status: DC
  Administered 2016-09-06 – 2016-09-07 (×2): 10 mg via NASOGASTRIC
  Filled 2016-09-06 (×3): qty 1

## 2016-09-06 MED ORDER — LIDOCAINE HCL (PF) 1 % IJ SOLN
INTRAMUSCULAR | Status: AC
  Filled 2016-09-06: qty 5

## 2016-09-06 MED ORDER — INSULIN ASPART 100 UNIT/ML ~~LOC~~ SOLN
2.0000 [IU] | SUBCUTANEOUS | Status: DC
  Administered 2016-09-06 – 2016-09-07 (×4): 2 [IU] via SUBCUTANEOUS

## 2016-09-06 NOTE — Progress Notes (Signed)
PULMONARY / CRITICAL CARE MEDICINE   Name: Bradley [name] MRN: [id number] DOB: 1961-05-02    ADMISSION DATE:  08/17/2016 CONSULTATION DATE:  08/19/16  REFERRING MD:  Dr Thayer Ohmhris Rama  CHIEF COMPLAINT:  Acute resp failure with hypoxemia  BRIEF 55 year old smoker, with PFT nov 2016 showing isolated reduction in dlco to 59% and quite functional and not on home o2. Followed by Dr Sherene SiresWert -0 last seen Sept/Nov 2016 based on CT chest evidence showing diffuse changes of centrilobular and paraseptal emphysema along with prominent interstitial markings primarily in the upper lobes consistent with pulmonary fibrosis. This was a non--UIP pattern. Patient recommended quitting smoking because of concerns of DIP. According to the patient and his wife, has continued to smoke. He has been in his baseline status of health and very functional. Then on 08/14/2016 he had a chest x-ray preoperative that only showed chronic changes without change for a right shoulder surgery rotator cuff tear. The procedure was uneventful. Postoperative course was uneventful. There was no vomiting. Then abruptly on 08/16/2016 he started getting acutely ill with shortness of breath and cough with brown colored sputum. Then admitted to the hospital. Initial white count was 13,000. BNP was normal. CT angiogram chest ruled out pulmonary embolism but showed diffuse ground glass opacities and enlarged mediastinal node measuring close to 2 cm. He was started on community acquired pneumonia antibiotics but has developed progressive acute respiratory failure necessitating Solu-Medrol since 08/18/2016 evening. And from the early hours of 08/19/2016 he's been on BiPAP with significant respiratory distress and therefore pulmonary critical care has been consulted.  At baseline he denies any mold exposure or mildew exposure or working with dust. He denies any birds in the house. He continues to smoke.   Events: 08/17/2016 - admit 08/18/2016-respiratory  virus panel, HIV and flu panel PCR negative 08/19/16 -bipap, move to icu -> intubated 10/21 10/22 repeat RVP from trach aspirate - negative 10/23 levo off 10/24 nimbex off 10/25 --> RR adjusted from 35 to 30 overnight due to respiratory acidosis. Did not improve gas, so RR decreased to 30 again. P/F ratios of 275 and 171, respectively.  10/26 --> ABGs improved on pressure control vent settings. FiO2 increased from 40 to 60 overnight due to desaturations.  10/29 - beginning to wean 10/30 - Fever to 100.8 F yesterday 10/31 - Fever to 100.9 F overnight. Not tolerating wean of sedation without becoming very asynchronous with vent. On 20 of PC above PEEP.  11/1- Fever to 101.9 this morning. Very obtunded. MRI brain obtained and negative.  11/2- OG tube out of place this morning. Off all sedating medications. EEG :  moderate generalized slowing of brain activity.  09/01/16 =Febrile overnight to 102.1. Still very obtunded without spontaneous movements. 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  Dx of toxic metabolic encephalopathy 09/02/16 - shaking head wich is an improvement but overall flaccid. 30% fio2. Spiked temp 102.74F despite antibiotics.  Na astill 150 09/03/16- Trach aspirate growing abundant GNR. Fever curve improved. More awake and following some command with increased limb movement.  09/04/16: PDT placed. Repeat MRI for continued encephalopathy: no acute infarct or intracranial hemorrhage. EEG repeated with mild diffuse slowing of electrocerebral activity without epileptiform activity.  09/05/16: Hemoptysis without tracheal blood. No further bleeding and HgB stable.   SUBJECTIVE/OVERNIGHT/INTERVAL HX 09/06/16- Afebrile overnight. Transitioned to trach collar this morning. Following commands this morning.   VITAL SIGNS: BP 111/85  Pulse 95   Temp 99.5 F (37.5 C) (Core (Comment))   Resp (!) 22   Ht 5\' 9"  (1.753 m)    Wt 212 lb (96.2 kg)   SpO2 98%   BMI 31.31 kg/m   HEMODYNAMICS:    Lines: Foley 10/21 >> ETT and OG 10/21 >>11/6 PDT 11/6 >>  CVC 10/22 >> PIV x 3 A-line left radial 10/22 >>>10/30  VENTILATOR SETTINGS: Vent Mode: PCV FiO2 (%):  [30 %] 30 % Set Rate:  [20 bmp] 20 bmp PEEP:  [5 cmH20] 5 cmH20 Pressure Support:  [10 cmH20] 10 cmH20 Plateau Pressure:  [15 cmH20-16 cmH20] 16 cmH20  INTAKE / OUTPUT: I/O last 3 completed shifts: In: 3656.3 [I.V.:911.3; NG/GT:2270; IV Piggyback:475] Out: 2500 [Urine:2500]    PHYSICAL EXAMINATION: General:  Middle-aged, alert and awake  Neuro: Eyes open. Tracks to voice. Follows commands. Strength of L UE better than R UE but globally weak.  HEENT:  Trach collar in place.  Cardiovascular:  Regular rate and rhythm Lungs: Decreased BS at bases Abdomen:  Soft nontender Musculoskeletal:  No cyanosis no clubbing no edema. Looks euvolemic; clean dry bandage over R shoulder Skin:  Intact on exposed areas. Extremities with good pedal pulses and equal in warmth.  LABS:  PULMONARY No results for input(s): PHART, PCO2ART, PO2ART, HCO3, TCO2, O2SAT in the last 168 hours.  Invalid input(s): PCO2, PO2 CBC  Recent Labs Lab 09/05/16 0340 09/05/16 1152 09/06/16 0440  HGB 10.8* 11.5* 11.2*  HCT 34.5* 36.4* 34.9*  WBC 14.8* 15.1* 14.4*  PLT 134* 142* 143*   COAGULATION  Recent Labs Lab 09/03/16 0505  INR 1.03   CARDIAC   No results for input(s): TROPONINI in the last 168 hours. No results for input(s): PROBNP in the last 168 hours.  CHEMISTRY  Recent Labs Lab 09/02/16 0500 09/03/16 0505 09/04/16 0445 09/05/16 0340 09/06/16 0440  NA 150* 146* 148* 147* 143  K 3.5 3.5 3.8 4.0 3.9  CL 114* 111 113* 111 111  CO2 28 29 29 27 25   GLUCOSE 153* 197* 92 216* 212*  BUN 43* 30* 25* 23* 22*  CREATININE 0.76 0.61 0.59* 0.57* 0.47*  CALCIUM 8.1* 7.7* 7.8* 7.8* 8.0*  MG 2.6* 2.4 2.4 2.5* 2.4  PHOS 4.3 2.8 3.2 3.0 3.2   Estimated  Creatinine Clearance: 119.4 mL/min (by C-G formula based on SCr of 0.47 mg/dL (L)).  LIVER  Recent Labs Lab 09/01/16 0929 09/03/16 0505  AST 32  --   ALT 62  --   ALKPHOS 31*  --   BILITOT 0.7  --   PROT 5.3*  --   ALBUMIN 2.3*  --   INR  --  1.03   INFECTIOUS  Recent Labs Lab 09/01/16 0929 09/01/16 0930 09/01/16 1201 09/02/16 0005 09/02/16 0500 09/03/16 0505  LATICACIDVEN  --  1.3 2.4* 1.1  --   --   PROCALCITON 0.15  --   --   --  0.24 0.33   ENDOCRINE CBG (last 3)   Recent Labs  09/05/16 1952 09/05/16 2322 09/06/16 0350  GLUCAP 163* 164* 209*   IMAGING x48h  - image(s) personally visualized  -   highlighted in bold Mr Brain W Wo Contrast  Result Date: 09/04/2016 CLINICAL DATA:  No acute intracranial abnormality as noted above. EXAM: MRI HEAD WITHOUT AND WITH CONTRAST TECHNIQUE: Multiplanar, multiecho pulse sequences of the brain and surrounding structures were obtained without and with intravenous contrast. CONTRAST:  20mL MULTIHANCE GADOBENATE DIMEGLUMINE 529 MG/ML IV  SOLN COMPARISON:  08/30/2016 brain MR. 08/23/2016 head CT. FINDINGS: Brain: No acute infarct or intracranial hemorrhage. 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 This appears to partially enhancement and therefore not a simple cyst. This is not causing significant compression of the superior colliculus currently. This can be assessed on follow-up MR in 3-6 months to confirm stability or sooner if the patient develops diplopia with upward gaze indicating compression of the superior colliculus. Mild chronic microvascular changes. Mild global atrophy without hydrocephalus. Partially empty sella without expansion. No MR evidence of encephalitis/cerebritis. No abnormal leptomeningeal enhancement as can be seen with meningitis. If this were of concern further investigation would be necessary. Vascular: Major intracranial vascular structures are patent. Skull and upper cervical spine: Negative. Sinuses/Orbits: No acute  orbital abnormality. Other: Minimal paranasal sinus mucosal thickening. Partial opacification mastoid air cells. Pooling of secretions upper pharynx without eustachian tube obstructing lesion. IMPRESSION: No acute infarct or intracranial hemorrhage. 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 This appears to partially enhancement and therefore not a simple cyst. This is not causing significant compression of the superior colliculus currently. This can be assessed on follow-up MR in 3-6 months to confirm stability or sooner if the patient develops diplopia with upward gaze indicating compression of the superior colliculus. Mild chronic microvascular changes. Mild global atrophy without hydrocephalus. Electronically Signed   By: Lacy Duverney M.D.   On: 09/04/2016 18:39   Dg Chest Port 1 View  Result Date: 09/04/2016 CLINICAL DATA:  Acute respiratory failure. EXAM: PORTABLE CHEST 1 VIEW COMPARISON:  09/04/2016 at 0458 hours. FINDINGS: Interval extubation with placement of a tracheostomy. Right PICC tip projects over the SVC. Heart size normal. Mild interstitial prominence and indistinctness are seen diffusely. No definite pleural fluid. IMPRESSION: Mild diffuse interstitial prominence and indistinctness may be due to slightly lower lung volumes than seen on examination performed earlier the same day. Difficult to exclude edema. Electronically Signed   By: Leanna Battles M.D.   On: 09/04/2016 13:41   Dg Abd Portable 1v  Result Date: 09/04/2016 CLINICAL DATA:  Encounter for nasogastric tube placement. EXAM: PORTABLE ABDOMEN - 1 VIEW COMPARISON:  08/31/2016 FINDINGS: Feeding tube is in the right mid abdomen and expected location of the descending duodenum. Nonobstructive bowel gas pattern. Question some densities in the medial left lung base. IMPRESSION: Feeding tube is in the descending duodenum. Electronically Signed   By: Richarda Overlie M.D.   On: 09/04/2016 17:24    Summary:  55-y/o male who developed acute  respiratory distress after shoulder surgery likely 2/2 ILD but treated for possible infectious etiology. Moving towards extubation, but mental status has been a barrier.   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 No known ILD exposures per hx  #Admit 08/17/2016 - Abrupt respiratory illness with acute hypoxemic respiratory failure with significant deterioration 3 days after right shoulder surgery. Pulmonary embolism ruled out. Diffuse ground glass opacities on CT chest at admission  - Differential diagnosis includes: Undiagnosed IPF flare, acute lung injury postop idiopathic  - Rx with ARDS net, nimbex through 08/22/16 and steroids (BAL 10/24 -non diag, normal autoimmne, RVP)  #Current 09/05/2016  - down to 30% fio2 but encephalopathy and myoneuropathy preventing extubation >> trach now in place   P:   Solumedrol 10 daily (day 18), reduced 09/06/16 - would dc this by  d21 Ipratroprium and xopenex q6h. Wean off vent with trach     CARDIOVASCULAR A:  MI ruled out but troponin increased to 0.07. Suspect demand. ECHO ok 10/22 - started on levophed, discontinued 10/23   -nil acute   P:  Monitor Even balance goals to neg    RENAL A:   AKI resolved  09/06/16 - hypernatermia resolved  P:   Free water  Replace electrolytes as indicated. Follow BMET.   GASTROINTESTINAL A:   Nil acute P:   NPO except meds TFs per nutrition PPI  HEMATOLOGIC A:   At risk for anemia of critical illness Leukocytosis persistent, now improving   P:  Monitor - stable. DVT prophylaxis - lovenox  INFECTIOUS Blood Cx 11/3: NGTD Urine Cx 11/3: NGTD Trach Cx 11/3: Abundant GNR, Abundant Enterobacter Aerogenes  A:   Unclear if this is community acquired pneumonia- treated Fever curve and leukocytosis improving    P:  Await panc culture 09/01/16 Follow fever curve WBC Tylenol for fevers  > 102F 11/8: Now off all abx   Abx: Zosyn 10/21>>>10/30, 11/3 >> 11/7 Vanc 10/21>>>10/26, 11/3 >> 11/5  ENDOCRINE A:   At risk for hyperglycemia  P:   Lantus increased to 18u qhs and moderate SSI  Added Novolog 2u q4h (hold if tube feeds held)  Solumedrol 10 daily (day 18), reduced 09/06/16 - would dc this by d21  NEUROLOGIC A:   Toxic metabolic encephalopathy with flaccidity 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  Pineal lesion no causing significant compression of superior colliculus. Radiology recommends repeat MRI in 3-6 months to confirm stability of sooner if patient develops diplopia with upward gaze.  Neurology to r/o cervical injury as cause of profound weakness.    P:   Diprivan  Monitor RASS goal: -1 Correct metabolic issues above (fever and high Na) Avoid opiopids, benzos MRI cervical spine   FAMILY  - no family at bedside 11/8   Marcy Siren, D.O. 09/06/2016, 6:49 AM PGY-2, [facility]  Attending:  I have seen and examined the patient with nurse practitioner/resident and agree with the note above.  We formulated the plan together and I elicited the following history.    Tracheostomy collar trial this morning No sedation overnight Much more awake  On exam: Few crackles left base, otherwise clear No trach bleeding RRR, no mgr GI: BS+, soft,   Acute encephalopathy> dramatically improving, hold sedation Weakness> MRI c-spine per neurology, given improvement, wonder if necessary Fever? Resolved, no clear evidence of HCAP> hold antibiotics Hypernatremia> resolved, continue free water Hyperglycemia> increase insuline ILD> wean off prednisone, decrease today, plan for 4 more days  OK to transfer to [facility]  Heber Ivyland, MD [place] [facility] Pager: [phone] Cell: [phone] After 3pm or if no response,  call [phone]

## 2016-09-06 NOTE — Procedures (Signed)
Pt placed on full support vent for transport to IR then to MRI and back to 64M03. Pt tolerated transport well.  Pt placed back on ATC when returned back to 64M.

## 2016-09-06 NOTE — Progress Notes (Signed)
RT Note: pt placed on PS 5/5 at this time, tolerating well. Will advance to ATC trial later this am as tolerated, RT will monitor

## 2016-09-06 NOTE — Progress Notes (Signed)
Subjective: Per family members patient has been improving slightly. Patient currently is treating cannot talk but can understand and follow some commands. Patient is unable to move both arms or legs but per nurse he is now able to start to wiggle his toes and his fingers. LP was attempted today to evaluate etiology why patient's mental status is not returning to baseline as quickly as expected.  Exam: Vitals:   09/06/16 1300 09/06/16 1329  BP:  111/83  Pulse:    Resp: (!) 24 (!) 25  Temp:          Gen: In bed, NAD MS: Patient is alert and is able to nod yes and no but he is nonverbal. CN: Right pupil is slightly larger than the left. extraocular motions are intact tongue is midline patient is breathing over the vent Motor: Patient has minimal strength throughout however his left upper extremity seems the better than his right. He is wiggling his toes at times. Sensory: Patient withdraws from noxious stimuli however during LP while lidocaine was being attempted to put into location of needle patient reacted briskly to the pain DTR: No significant deep tendon reflexes were noted throughout.  Pertinent Labs/Diagnostics: Magnesium and phosphorus were within normal limits Estimated LP was attempted however I was unable to obtain any CSF and this will be done under fluoroscopy.  Felicie MornDavid Smith PA-C [facility] [phone]  Impression: [age] yo M admitted with respiratory failure who then developaed severe encephalopathy. It continues to be possible that this represents a encephalopathy due to gram-negative infection and steroids. His profound weakness is concerning, and though it appears most consistent with peripheral etiology (EEG critical care myoneuropathy) I would favor ruling out cervical injury as well.   Recommendations: 1) MRI of cervical spine pending 2) LP under fluoroscopy pending    09/06/2016, 2:06 PM

## 2016-09-06 NOTE — Procedures (Signed)
Indication: Prolonged altered mental status   Risks of the procedure were dicussed with the patient family including post-LP headache, bleeding, infection, weakness/numbness of legs(radiculopathy), death.  The patient's proxy agreed and written consent was obtained.   The patient was prepped and draped, and using sterile technique a 20 gauge quinke spinal needle was inserted in the  L3/L4 space. Due to patient constant moving and inability to remain in fetal position I was unable to obtain CSF. LP will need to be done under fluoroscopy. No complications were encountered.   Felicie Mornavid Amorette Charrette PA-C [facility] [phone]  M-F  (am- 4 PM)  09/06/2016, 1:26 PM

## 2016-09-06 NOTE — Procedures (Signed)
CCLINICAL DATA: [Altered mental status.  COPD. Acute respiratory distress syndrome.] EXAM:  DIAGNOSTIC LUMBAR PUNCTURE UNDER FLUOROSCOPIC GUIDANCE FLUOROSCOPY TIME: Fluoroscopy Time:  [2 minutes and 54 seconds] Number of Acquired Spot Images: [0] PROCEDURE:  Informed consent was obtained from the patient's wife prior to the procedure.  A "Time out" was performed. With the patient prone, the lower back was prepped with Betadine.  1% Lidocaine was used for local anesthesia.  Lumbar puncture was performed at the [L3/4] level using a [20] gauge needle with return of [clear] CSF. Opening pressure could not be determined secondary to low pressure and patient's clinical status. Intracranial pressure not elevated.  [10.5] ml of CSF were obtained for laboratory studies.  The patient tolerated the procedure well and there were no apparent complications.   IMPRESSION: Uncomplicated lumbar puncture.  puncture under fluoroscopic guidance, as detailed above.]

## 2016-09-07 LAB — BASIC METABOLIC PANEL WITH GFR
Anion gap: 5 (ref 5–15)
BUN: 21 mg/dL — ABNORMAL HIGH (ref 6–20)
CO2: 27 mmol/L (ref 22–32)
Calcium: 8 mg/dL — ABNORMAL LOW (ref 8.9–10.3)
Chloride: 108 mmol/L (ref 101–111)
Creatinine, Ser: 0.44 mg/dL — ABNORMAL LOW (ref 0.61–1.24)
GFR calc Af Amer: >60 mL/min
GFR calc non Af Amer: >60 mL/min
Glucose, Bld: 151 mg/dL — ABNORMAL HIGH (ref 65–99)
Potassium: 3.8 mmol/L (ref 3.5–5.1)
Sodium: 140 mmol/L (ref 135–145)

## 2016-09-07 LAB — GLUCOSE, CAPILLARY
GLUCOSE-CAPILLARY: 116 mg/dL — AB (ref 65–99)
GLUCOSE-CAPILLARY: 161 mg/dL — AB (ref 65–99)
GLUCOSE-CAPILLARY: 166 mg/dL — AB (ref 65–99)
Glucose-Capillary: 140 mg/dL — ABNORMAL HIGH (ref 65–99)
Glucose-Capillary: 155 mg/dL — ABNORMAL HIGH (ref 65–99)
Glucose-Capillary: 150 mg/dL — ABNORMAL HIGH (ref 65–99)

## 2016-09-07 LAB — CBC
HCT: 37.2 % — ABNORMAL LOW (ref 39.0–52.0)
HEMOGLOBIN: 11.9 g/dL — AB (ref 13.0–17.0)
MCH: 30 pg (ref 26.0–34.0)
MCHC: 32 g/dL (ref 30.0–36.0)
MCV: 93.7 fL (ref 78.0–100.0)
PLATELETS: 119 10*3/uL — AB (ref 150–400)
RBC: 3.97 MIL/uL — ABNORMAL LOW (ref 4.22–5.81)
RDW: 15 % (ref 11.5–15.5)
WBC: 14 10*3/uL — ABNORMAL HIGH (ref 4.0–10.5)

## 2016-09-07 LAB — MAGNESIUM: Magnesium: 2.2 mg/dL (ref 1.7–2.4)

## 2016-09-07 MED ORDER — LEVALBUTEROL HCL 1.25 MG/0.5ML IN NEBU
1.2500 mg | INHALATION_SOLUTION | Freq: Three times a day (TID) | RESPIRATORY_TRACT | Status: DC
  Administered 2016-09-08 (×3): 1.25 mg via RESPIRATORY_TRACT
  Filled 2016-09-07 (×4): qty 0.5

## 2016-09-07 MED ORDER — IPRATROPIUM BROMIDE 0.02 % IN SOLN
0.5000 mg | Freq: Three times a day (TID) | RESPIRATORY_TRACT | Status: DC
  Administered 2016-09-08 (×3): 0.5 mg via RESPIRATORY_TRACT
  Filled 2016-09-07 (×3): qty 2.5

## 2016-09-07 MED ORDER — SODIUM CHLORIDE 0.9 % IV SOLN
1.0000 g | Freq: Three times a day (TID) | INTRAVENOUS | Status: DC
  Administered 2016-09-07 – 2016-09-08 (×2): 1 g via INTRAVENOUS
  Filled 2016-09-07 (×4): qty 1

## 2016-09-07 MED ORDER — INSULIN ASPART 100 UNIT/ML ~~LOC~~ SOLN
3.0000 [IU] | SUBCUTANEOUS | Status: DC
  Administered 2016-09-07 – 2016-09-08 (×10): 3 [IU] via SUBCUTANEOUS

## 2016-09-07 MED ORDER — WHITE PETROLATUM GEL
Status: AC
  Administered 2016-09-07: 12:00:00
  Filled 2016-09-07: qty 1

## 2016-09-07 MED ORDER — ENOXAPARIN SODIUM 40 MG/0.4ML ~~LOC~~ SOLN
40.0000 mg | SUBCUTANEOUS | Status: DC
  Administered 2016-09-07 – 2016-09-08 (×2): 40 mg via SUBCUTANEOUS
  Filled 2016-09-07 (×2): qty 0.4

## 2016-09-07 NOTE — Progress Notes (Signed)
Patient placed on 28% trach collar.  Currently tolerating well.   

## 2016-09-07 NOTE — Care Management Note (Addendum)
Case Management Note  Patient Details  Name: Bradley [name] MRN: [id number] Date of Birth: [birth year]  Subjective/Objective:     Pt admitted with resp distress                Action/Plan:  Pt is from home with wife.  Pt remains on ventilator, now with cold and mottling to LE.  CM will continue to follow for discharge needs   Expected Discharge Date:                  Expected Discharge Plan:     In-House Referral:     Discharge planning Services  CM Consult  Post Acute Care Choice:    Choice offered to:     DME Arranged:    DME Agency:     HH Arranged:    HH Agency:     Status of Service:  In process, will continue to follow  If discussed at Long Length of Stay Meetings, dates discussed:    Additional Comments: 09/07/2016  CM informed that pt has a denial for [facility] is requesting peer to peer. Dr Lake Bells has agreed to perform peer to peer today - [facility] liason is arranging peer to peer.  CM will continue to follow for discharge needs  Discussed in LOS 09/07/16;  Pt remains appropriate for continued stay and LTACH discharge.  Pt trached 09/03/16.  Pt tolerated TC all day 11/8 - back on vent at night - and then back on TC this am.  Pts mental status continues to improve and he is following some commands.  Pt remains on tube feeds and IV steriods.  Insurance Josem Kaufmann is pending  09/05/16 Per physician advisor pt is appropriate for [facility] - discussed at LOS 09/05/16 and referrals given to both agencies.  CM offered pts wife choice between [facility] and [facility]- wife immediately chose [facility] due to location preference.  Wife is in agreement to meet with [facility] only. CM informed wife that [facility] will attempt insurance auth however if not approved [facility] can be pursued - wife agreed to this plan.  [facility] liaison met with wife and will initiate insurance auth  116/17 Pt trached today.  CM received LTACH referral from attending - CM left voicemail for Physician Advisor regarding  appropriateness of LTACH referral, CSW already following for possible discharge to SNF.  CM spoke with wife regarding discharge disposition.  Wife stated she understood the need for a post acute facility at current condition.  Pt remains on ventilator via trach, IV antibiotics, IV steroids, Tube Feeds.  09/01/16 Pt is still not waking up - MRI negative.  Neuro and vascular consulting Maryclare Labrador, RN 09/07/2016, 8:55 AM

## 2016-09-07 NOTE — Progress Notes (Signed)
eLink Physician-Brief Progress Note Patient Name: Bradley FraiseDavid M [name] DOB: 05/19/1961 MRN: [id number]   Date of Service  09/07/2016  HPI/Events of Note  Resp Cx from 11/3 resulted as ABUNDANT ENTEROBACTER AEROGENES  MODERATE GRAM NEGATIVE RODS   Resistant to zosyn. Pt was previously on zosyn. WBC count still high  eICU Interventions  Will start meropenem tonight Day team to assess clinical status and decide if we need to continue     Intervention Category Evaluation Type: Other  Bradley [name] 09/07/2016, 9:53 PM

## 2016-09-07 NOTE — Discharge Summary (Signed)
Physician Discharge Summary  Patient ID: Bradley [name] MRN: [id number] DOB/AGE: 03/05/1961 55 y.o.  Admit date: 08/17/2016 Discharge date: 09/08/2016    Discharge Diagnoses:  -COPD -Pulmonary fibrosis -Tobacco use disorder -Muscle weakness                                                                       DISCHARGE PLAN BY DIAGNOSIS      Discharge Plan: -Respiratory failure status post tracheostomy: wean off trach as tolerated -COPD: continue home medications  -Tobacco use disorder: counsel on tobacco use  -Muscle weakness: PT                 DISCHARGE SUMMARY   Bradley [name] is a 55 y.o. y/o male with a PMH of COPD, Pulmonary fibrosis, depression/anxiety and tobacco use disorder.  HPI:  55 year old smoker, with PFT nov 2016 showing isolated reduction in dlco to 59% and quite functional and not on home o2. Followed by Dr Wert -0 last seen Sept/Nov 2016 based on CT chest evidence showing diffuse changes of centrilobular and paraseptal emphysema along with prominent interstitial markings primarily in the upper lobes consistent with pulmonary fibrosis. This was a non--UIP pattern. Patient recommended quitting smoking because of concerns of DIP. According to the patient and his wife, has continued to smoke. He has been in his baseline status of health and very functional.   Hospital course:  On 08/14/2016, patient had a preoperative chest x-ray for shoulder surgery that only showed chronic changes without change. He had right shoulder arthroscopy, debridement and open rotator cuff repair for right shoulder partial biceps tendon tear and complete rotator cuff tear. The procedure was uneventful. Postoperative course was uneventful. There was no vomiting.   On 08/16/2016, patient started getting acutely ill with shortness of breath and cough with brown colored sputum. Then admitted to the hospital. Initial white count was 13,000. BNP was normal. CT angiogram chest ruled out  pulmonary embolism but showed diffuse ground glass opacities and enlarged mediastinal node measuring close to 2 cm. At baseline he denies any mold exposure or mildew exposure or working with dust. He denies any birds in the house. He continues to smoke. He was started on community acquired pneumonia antibiotics but has developed progressive acute respiratory failure necessitating Solu-Medrol since 08/18/2016 evening.   On 08/19/2016,  he was started on BiPAP due to significant respiratory distress. However, patient continue to have respiratory distress on BIPAP and transferred to ICU and got intubated.   Respiratory:  In ICU, patient was intubated for acute hypoxemic respiratory failure/hypercapnic respiratory acidosis with significant deterioration 3 days after right shoulder surgery. He was started on ventilation for ARDS net protocol with Nimbex. Nimbex discontinued on 08/22/2016. Although his acidosis improved, he could not come off mechanical ventilation completely which necessitated percutaneous tracheostomy on 09/04/2016.   As of 11/6, patient is on tracheostomy collar during the daytime and is no longer requiring pressure control ventilation at night.   Patient is a chronic smoker. PFT on 11/9/ 2016 with isolated reduced DLCO, very minimal irreversible obstructive airways disease and moderate diffusion defect. He has no known ILD exposures per history.   CVS:  Patient was started on Levophed for hypotension on 08/19/2016. Levophed   was discontinued on 08/21/16. Troponin was mildly elevated to 0.07 suggestive for demand ischemia. Echocardiogram on 08/19/2016 with EF 60-65% and PAPP of 56 mm Hg . Otherwise, he remained hemodynamically stable for the rest of his stay.  ID: From infectious standpoint, patient with leukocytosis and intermittent fever. Fever and leukocytosis trended down and resolved. Respiratory viral panel negative x2, two sets of blood cultures on 08/18/16 and 09/01/16 and urine  culture were negative. Bronchoalveolar lavage on 08/22/2016 unremarkable. Aoutoimmune and vasculitis labs also negative.  Repeat respiratory culture on 09/01/2016 with Enterobacter Aerogenes, which was thought not to be a pathogen because patient improved clinically treated with Zosyn which the organism was resistant to. Patient has a lumbar puncture on 09/06/2016. Cytology, gram stain and culture were negative so far. Fungal culture pending.  He received the following antibiotics:   -Vancomycin:08/19/16-08/27/16, then 11/3/-09/03/16  -Zosyn: 08/19/16-08/28/16, then 09/01/16-09/05/16  -Diflucan 100 mg twice a day 11/7>  Neuro:  Patient developed unexplained encephalopathy (obtundation and without spontaneous movement) on 08/30/2016 that didn't improve by holding his sedating medications. Patient was started on Solumedrol taper. He is currently on day 20 of 21 at 5 mg daily. Neurology was consulted. MRI brain was obtained and negative for acute intracranial process. EEG read as moderate generalized slowing of brain activity with unexplained etiology. MRI of cervical spine with some degenerative changes and moderate left foraminal narrowing at C5-6. Patient has a fluoroscopy guided lumbar puncture on 09/06/2016. Cytology, gram stain and culture were negative so far. Fungal culture pending.  On 09/07/2016, patient started to move his arms a little bit more. 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 Neurology recommended against further testing and signed off. Physical therapy was started while patient was in the MICU.    SIGNIFICANT DIAGNOSTIC STUDIES -Microbiology data as below -08/17/2016: CTA negative for PE, with worsened diffuse bilateral regional ground-glass airspace opacification, with underlying emphysema and interstitial prominence and enlarged mediastinal nodes, measuring up to 1.9 cm in short axis  -08/19/2016:  Echocardiogram with EF 60-65% and PAPP of 56 mm Hg  -09/01/2016: bilateral ABIs and TBIs are within normal limits at rest.  -08/23/2016: CT head without acute intracranial process.   -08/30/2016: MRI brain without acute process, normal for age.   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 Also moderate left foraminal narrowing at C5-6 .   -08/31/2016: EEG: with evidence of moderate generalized slowing of brain activity.   -09/04/2016: EEG: abnormal demonstrating a mild diffuse slowing of electrocerebral activity  MICRO DATA  Blood Cx 08/18/2016 and 09/01/16: NGTD Urine Cx 11/3: NGTD RVP on 08/18/2016 and 08/20/2016: negative. BAL Cx: 08/22/2016: negative Trach Cx 09/01/2016: Abundant GNR, Abundant Enterobacter Aerogenes  ANTIBIOTICS -Vancomycin:08/19/16-08/27/16, then 11/3/-09/03/16 -Zosyn: 08/19/16-08/28/16, then 09/01/16-09/05/16 -Diflucan 100 mg twice a day 11/7>  CONSULTS -Neurology -Vascular out of concern for PAD  TUBES / LINES Rectal tube/pouch:  Tracheostomy Shiley 6 mm Cuffed. 09/04/2016>>  PICC Double Lumen  in Right Brachial 41 cm 0 cm. 09/02/16>>  Nasoenteric Feeding Tube Cortrak - 43 inches 10 Fr. In Left nare.  09/04/2016>>  Discharge Exam: General:  Middle-aged, alert and awake  Neuro: Eyes open. Tracks to voice. Follows commands. Strength improving in extremities but still globally weak.  HEENT:  Trach collar in place.  Cardiovascular:  Regular rate and   rhythm Lungs: Decreased BS at bases Abdomen:  Soft nontender Musculoskeletal:  No cyanosis no clubbing no edema. Looks euvolemic; clean dry bandage over R shoulder Skin:  Intact on exposed areas. Extremities with good pedal pulses and equal in warmth.   Vitals:   09/08/16 1400 09/08/16 1507 09/08/16 1522 09/08/16 1600  BP: (!) 125/91     Pulse: 93    83  Resp: (!) 22   20  Temp:   97.8 F (36.6 C)   TempSrc:   Oral   SpO2: 95% 96%  100%  Height:         Discharge Labs  BMET  Recent Labs Lab 09/02/16 0500 09/03/16 0505 09/04/16 0445 09/05/16 0340 09/06/16 0440 09/07/16 0514 09/07/16 0630 09/08/16 0500  NA 150* 146* 148* 147* 143 140  --  137  K 3.5 3.5 3.8 4.0 3.9 3.8  --  3.8  CL 114* 111 113* 111 111 108  --  104  CO2 _0 --  29  GLUCOSE 153* 197* 92 216* 212* 151*  --  171*  BUN 43* 30* 25* 23* 22* 21*  --  18  CREATININE 0.76 0.61 0.59* 0.57* 0.47* 0.44*  --  0.39*  CALCIUM 8.1* 7.7* 7.8* 7.8* 8.0* 8.0*  --  8.0*  MG 2.6* 2.4 2.4 2.5* 2.4  --  2.2  --   PHOS 4.3 2.8 3.2 3.0 3.2  --   --   --     CBC  Recent Labs Lab 09/06/16 0440 09/07/16 0514 09/08/16 0500  HGB 11.2* 11.9* 11.4*  HCT 34.9* 37.2* 35.3*  WBC 14.4* 14.0* 11.9*  PLT 143* 119* 113*    Anti-Coagulation  Recent Labs Lab 09/03/16 0505  INR 1.03            Medication List    STOP taking these medications   methocarbamol 500 MG tablet Commonly known as:  ROBAXIN   oxyCODONE-acetaminophen 5-325 MG tablet Commonly known as:  ROXICET     TAKE these medications   albuterol 108 (90 Base) MCG/ACT inhaler Commonly known as:  PROVENTIL HFA;VENTOLIN HFA Inhale 1 puff into the lungs every 6 (six) hours as needed for wheezing or shortness of breath.   citalopram 20 MG tablet Commonly known as:  CELEXA Take 30 mg by mouth every morning.   enoxaparin 40 MG/0.4ML injection Commonly known as:  LOVENOX Inject 0.4 mLs (40 mg total) into the skin daily.   famotidine 20-0.9 MG/50ML-% Commonly known as:  PEPCID Inject 50 mLs (20 mg total) into the vein every 12 (twelve) hours.   feeding supplement (VITAL AF 1.2 CAL) Liqd Place 1,000 mLs into feeding tube continuous.   fexofenadine-pseudoephedrine 60-120 MG 12 hr tablet Commonly known as:  ALLEGRA-D Take 1 tablet by mouth every morning.   Fluticasone-Salmeterol  250-50 MCG/DOSE Aepb Commonly known as:  ADVAIR Inhale 1 puff into the lungs 2 (two) times daily.   ibuprofen 200 MG tablet Commonly known as:  ADVIL,MOTRIN Take 600 mg by mouth every 6 (six) hours as needed for mild pain.   insulin aspart 100 UNIT/ML injection Commonly known as:  novoLOG Inject 0-15 Units into the skin every 4 (four) hours.   insulin aspart 100 UNIT/ML injection Commonly known as:  novoLOG Inject 3 Units into the skin every 4 (four) hours.   insulin glargine 100 UNIT/ML injection Commonly known as:  LANTUS Inject 0.18 mLs (18 Units total) into the skin at bedtime.   ipratropium  0.02 % nebulizer solution Commonly known as:  ATROVENT Take 2.5 mLs (0.5 mg total) by nebulization 3 (three) times daily.   levalbuterol 1.25 MG/0.5ML nebulizer solution Commonly known as:  XOPENEX Take 1.25 mg by nebulization 3 (three) times daily.   mouth rinse Liqd solution 15 mLs by Mouth Rinse route QID.   polyethylene glycol packet Commonly known as:  MIRALAX / GLYCOLAX Take 17 g by mouth daily as needed for moderate constipation.   predniSONE 5 MG tablet Commonly known as:  DELTASONE 1 tablet (5 mg total) by Per NG tube route daily with breakfast. Start taking on:  09/09/2016         Disposition: Patient to discharge to [facility]   Discharged Condition: Bradley [name] has met maximum benefit of inpatient care and is medically stable and cleared for discharge.    Time spent on disposition:  Greater than 35 minutes.      

## 2016-09-07 NOTE — Progress Notes (Signed)
PULMONARY / CRITICAL CARE MEDICINE   Name: Bradley [name] MRN: [id number] DOB: [birth year]    ADMISSION DATE:  08/17/2016 CONSULTATION DATE:  08/19/16  REFERRING MD:  Dr Thayer Ohmhris Rama  CHIEF COMPLAINT:  Acute resp failure with hypoxemia  BRIEF 55 year old smoker, with PFT nov 2016 showing isolated reduction in dlco to 59% and quite functional and not on home o2. Followed by Dr Sherene SiresWert -0 last seen Sept/Nov 2016 based on CT chest evidence showing diffuse changes of centrilobular and paraseptal emphysema along with prominent interstitial markings primarily in the upper lobes consistent with pulmonary fibrosis. This was a non--UIP pattern. Patient recommended quitting smoking because of concerns of DIP. According to the patient and his wife, has continued to smoke. He has been in his baseline status of health and very functional. Then on 08/14/2016 he had a chest x-ray preoperative that only showed chronic changes without change for a right shoulder surgery rotator cuff tear. The procedure was uneventful. Postoperative course was uneventful. There was no vomiting. Then abruptly on 08/16/2016 he started getting acutely ill with shortness of breath and cough with brown colored sputum. Then admitted to the hospital. Initial white count was 13,000. BNP was normal. CT angiogram chest ruled out pulmonary embolism but showed diffuse ground glass opacities and enlarged mediastinal node measuring close to 2 cm. He was started on community acquired pneumonia antibiotics but has developed progressive acute respiratory failure necessitating Solu-Medrol since 08/18/2016 evening. And from the early hours of 08/19/2016 he's been on BiPAP with significant respiratory distress and therefore pulmonary critical care has been consulted.  At baseline he denies any mold exposure or mildew exposure or working with dust. He denies any birds in the house. He continues to smoke.   Events: 08/17/2016 - admit 08/18/2016-respiratory  virus panel, HIV and flu panel PCR negative 08/19/16 -bipap, move to icu -> intubated 10/21 10/22 repeat RVP from trach aspirate - negative 10/23 levo off 10/24 nimbex off 10/25 --> RR adjusted from 35 to 30 overnight due to respiratory acidosis. Did not improve gas, so RR decreased to 30 again. P/F ratios of 275 and 171, respectively.  10/26 --> ABGs improved on pressure control vent settings. FiO2 increased from 40 to 60 overnight due to desaturations.  10/29 - beginning to wean 10/30 - Fever to 100.8 F yesterday 10/31 - Fever to 100.9 F overnight. Not tolerating wean of sedation without becoming very asynchronous with vent. On 20 of PC above PEEP.  11/1- Fever to 101.9 this morning. Very obtunded. MRI brain obtained and negative.  11/2- OG tube out of place this morning. Off all sedating medications. EEG :  moderate generalized slowing of brain activity.  09/01/16 =Febrile overnight to 102.1. Still very obtunded without spontaneous movements. 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  Dx of toxic metabolic encephalopathy 09/02/16 - shaking head wich is an improvement but overall flaccid. 30% fio2. Spiked temp 102.46F despite antibiotics.  Na astill 150 09/03/16- Trach aspirate growing abundant GNR. Fever curve improved. More awake and following some command with increased limb movement.  09/04/16: PDT placed. Repeat MRI for continued encephalopathy: no acute infarct or intracranial hemorrhage. EEG repeated with mild diffuse slowing of electrocerebral activity without epileptiform activity.  09/05/16: Hemoptysis without tracheal blood. No further bleeding and HgB stable.  09/06/16: Transitioned to trach collar. LP unremarkable. MRI C-spine without compression.   SUBJECTIVE/OVERNIGHT/INTERVAL HX 09/07/16: No acute overnight events. Mental status  continues to improve and patient continues to follow commands. Some lower CBGs  yesterday when off tube feeds for procedures but now elevated again.   VITAL SIGNS: BP (!) 83/69   Pulse 75   Temp 97.4 F (36.3 C) (Oral)   Resp (!) 21   Ht 5\' 9"  (1.753 m)   Wt 212 lb (96.2 kg)   SpO2 100%   BMI 31.31 kg/m   HEMODYNAMICS:    Lines: Foley 10/21 >> ETT and OG 10/21 >>11/6 PDT 11/6 >>  CVC 10/22 >> PIV x 3 A-line left radial 10/22 >>>10/30  VENTILATOR SETTINGS: Vent Mode: PCV FiO2 (%):  [30 %-40 %] 40 % Set Rate:  [20 bmp] 20 bmp PEEP:  [5 cmH20] 5 cmH20 Pressure Support:  [5 cmH20] 5 cmH20 Plateau Pressure:  [13 cmH20-18 cmH20] 18 cmH20  INTAKE / OUTPUT: I/O last 3 completed shifts: In: 3820.3 [I.V.:495.3; NG/GT:2875; IV Piggyback:450] Out: 2390 [Urine:2390]    PHYSICAL EXAMINATION: General:  Middle-aged, alert and awake  Neuro: Eyes open. Tracks to voice. Follows commands. Strength of L UE better than R UE but globally weak.  HEENT:  Trach collar in place.  Cardiovascular:  Regular rate and rhythm Lungs: Decreased BS at bases Abdomen:  Soft nontender Musculoskeletal:  No cyanosis no clubbing no edema. Looks euvolemic; clean dry bandage over R shoulder Skin:  Intact on exposed areas. Extremities with good pedal pulses and equal in warmth.  LABS:  PULMONARY No results for input(s): PHART, PCO2ART, PO2ART, HCO3, TCO2, O2SAT in the last 168 hours.  Invalid input(s): PCO2, PO2 CBC  Recent Labs Lab 09/05/16 1152 09/06/16 0440 09/07/16 0514  HGB 11.5* 11.2* 11.9*  HCT 36.4* 34.9* 37.2*  WBC 15.1* 14.4* 14.0*  PLT 142* 143* PENDING   COAGULATION  Recent Labs Lab 09/03/16 0505  INR 1.03   CARDIAC   No results for input(s): TROPONINI in the last 168 hours. No results for input(s): PROBNP in the last 168 hours.  CHEMISTRY  Recent Labs Lab 09/02/16 0500 09/03/16 0505 09/04/16 0445 09/05/16 0340 09/06/16 0440 09/07/16 0514  NA 150* 146* 148* 147* 143 140  K 3.5 3.5 3.8 4.0 3.9 3.8  CL 114* 111 113* 111 111 108  CO2 28 29  29 27 25 27   GLUCOSE 153* 197* 92 216* 212* 151*  BUN 43* 30* 25* 23* 22* 21*  CREATININE 0.76 0.61 0.59* 0.57* 0.47* 0.44*  CALCIUM 8.1* 7.7* 7.8* 7.8* 8.0* 8.0*  MG 2.6* 2.4 2.4 2.5* 2.4  --   PHOS 4.3 2.8 3.2 3.0 3.2  --    Estimated Creatinine Clearance: 119.4 mL/min (by C-G formula based on SCr of 0.44 mg/dL (L)).  LIVER  Recent Labs Lab 09/01/16 0929 09/03/16 0505  AST 32  --   ALT 62  --   ALKPHOS 31*  --   BILITOT 0.7  --   PROT 5.3*  --   ALBUMIN 2.3*  --   INR  --  1.03   INFECTIOUS  Recent Labs Lab 09/01/16 0929 09/01/16 0930 09/01/16 1201 09/02/16 0005 09/02/16 0500 09/03/16 0505  LATICACIDVEN  --  1.3 2.4* 1.1  --   --   PROCALCITON 0.15  --   --   --  0.24 0.33   ENDOCRINE CBG (last 3)   Recent Labs  09/06/16 1938 09/06/16 2349 09/07/16 0416  GLUCAP 96 260* 140*   IMAGING x48h  - image(s) personally visualized  -   highlighted in bold Mr Cervical Spine Wo Contrast  Result Date: 09/06/2016 CLINICAL DATA:  Weakness. EXAM: MRI CERVICAL SPINE WITHOUT CONTRAST TECHNIQUE: Multiplanar, multisequence MR imaging of the cervical spine was performed. No intravenous contrast was administered. COMPARISON:  None. FINDINGS: Image quality degraded by moderate motion. The patient was moving on all sequences. Alignment: Normal alignment. Straightening of the cervical lordosis. Vertebrae: Negative for fracture or mass. Cord: Limited evaluation of the cord due to motion. No cord lesion identified. Posterior Fossa, vertebral arteries, paraspinal tissues: Negative Disc levels: C2-3:  Negative C3-4:  Negative C4-5:  Negative C5-6: Disc degeneration and spondylosis. Diffuse uncinate spurring with moderate left foraminal narrowing and mild right foraminal narrowing. No cord deformity C6-7: Disc degeneration and spondylosis. Mild spinal and foraminal stenosis bilaterally C7-T1: Negative IMPRESSION: Image quality degraded by significant motion Cervical degenerative changes appear  chronic and are most prominent at C5-6. There is moderate left foraminal narrowing at C5-6 due to spurring and mild right foraminal narrowing. Mild spondylosis also at C6-7. Electronically Signed   By: Marlan Palauharles  Clark M.D.   On: 09/06/2016 16:39   Dg Chest Port 1 View  Result Date: 09/06/2016 CLINICAL DATA:  ARDS EXAM: PORTABLE CHEST 1 VIEW COMPARISON:  09/04/2016 FINDINGS: Tracheostomy tube, feeding catheter and right-sided PICC line are again identified and stable. The overall inspiratory effort has improved with clearing of bilateral infiltrates. Some bibasilar atelectasis remains. No new focal abnormality is seen. IMPRESSION: Significant improved aeration with only mild bibasilar atelectasis. Electronically Signed   By: Alcide CleverMark  Lukens M.D.   On: 09/06/2016 08:51   Dg Fluoro Guide Lumbar Puncture  Result Date: 09/06/2016 CLINICAL DATA:  Altered mental status. COPD. Acute respiratory distress syndrome. EXAM: DIAGNOSTIC LUMBAR PUNCTURE UNDER FLUOROSCOPIC GUIDANCE FLUOROSCOPY TIME:  Fluoroscopy Time:  2 minutes and 54 seconds Number of Acquired Spot Images: 0 PROCEDURE: Informed consent was obtained from the patient's wife prior to the procedure. A "Time out" was performed. With the patient prone, the lower back was prepped with Betadine. 1% Lidocaine was used for local anesthesia. Lumbar puncture was performed at the L3/4 level using a 20 gauge needle with return of clear CSF. Opening pressure could not be determined secondary to low pressure and patient's clinical status. Intracranial pressure not elevated. 10.5 ml of CSF were obtained for laboratory studies. The patient tolerated the procedure well and there were no apparent complications. IMPRESSION: Uncomplicated lumbar puncture under fluoroscopic guidance, as detailed above. Electronically Signed   By: Jeronimo GreavesKyle  Talbot M.D.   On: 09/06/2016 16:33    ASSESSMENT / PLAN:  PULMONARY A: #Baseline - Chronic smoker - September 2016 with isolated reduction  in diffusion capacity associated with emphysema and non-UIP pattern of interstitial lung disease - not otherwise specified. No known ILD exposures per hx  #Admit 08/17/2016 - Abrupt respiratory illness with acute hypoxemic respiratory failure with significant deterioration 3 days after right shoulder surgery. Pulmonary embolism ruled out. Diffuse ground glass opacities on CT chest at admission  - Differential diagnosis includes: Undiagnosed IPF flare, acute lung injury postop idiopathic  - Rx with ARDS net, nimbex through 08/22/16 and steroids (BAL 10/24 -non diag, normal autoimmne, RVP)  #Current 09/07/2016  - Trach collar in place   P:   Solumedrol 10 daily (day 19), reduced 09/06/16 - would dc this by d21 Ipratroprium and xopenex q6h.     CARDIOVASCULAR A:  MI ruled out but troponin increased to 0.07. Suspect demand. ECHO ok 10/22 - started on levophed, discontinued 10/23   -nil acute   P:  Monitor Even balance goals  to neg    RENAL A:   AKI resolved   - hypernatermia resolved  P:   Replace electrolytes as indicated. Follow BMET.   GASTROINTESTINAL A:   Nil acute P:   NPO except meds TFs per nutrition PPI  HEMATOLOGIC A:   At risk for anemia of critical illness Leukocytosis persistent, now improving   P:  Monitor - stable. DVT prophylaxis - lovenox  INFECTIOUS Blood Cx 11/3: NGTD Urine Cx 11/3: NGTD Trach Cx 11/3: Abundant GNR, Abundant Enterobacter Aerogenes  A:   Unclear if this is community acquired pneumonia- treated Fever curve and leukocytosis improving   P:   Follow fever curve WBC Tylenol for fevers  > 102F Now off all abx since clinically improved   Abx: Zosyn 10/21>>>10/30, 11/3 >> 11/7 Vanc 10/21>>>10/26, 11/3 >> 11/5  ENDOCRINE A:   At risk for hyperglycemia  P:   Lantus increased to 18u qhs and moderate SSI  Increase Novolog to 3u q4h (hold if tube feeds held)  Solumedrol 10 daily (day 18), reduced 09/06/16 - would dc this by  d21  NEUROLOGIC A:   Toxic metabolic encephalopathy with flaccidity 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  Pineal lesion no causing significant compression of superior colliculus. Radiology recommends repeat MRI in 3-6 months to confirm stability of sooner if patient develops diplopia with upward gaze.  C-spine without compression and LP unremarkable.    P:   Monitor RASS goal: 0, -1 Avoid opiopids, benzos Neurology signed off    FAMILY  - no family at bedside 11/9  DISPO: Ready for transfer to [facility]    Marcy Siren, D.O. 09/07/2016, 6:34 AM PGY-2, [facility]     Attending:  I have seen and examined the patient with nurse practitioner/resident and agree with the note above.  We formulated the plan together and I elicited the following history.    Slept on vent overnight  On exam Lungs clear, trach collar Moving hands a little more Interactive  respriatory failure> improving, will still need to wean off vent, wean off prednisone Weakness> deconditioning primarily complicated by steroids, wean off prednisone, PT, LTAC placement Rest as above  Heber Houston, MD [place] [facility] Pager: [phone] Cell: [phone] After 3pm or if no response, call [phone]

## 2016-09-07 NOTE — Progress Notes (Signed)
Speech-Language Pathology Note:  Chart reviewed as part of trach team. Note that pt has started TC trials. Recommend to consider PMV evaluation.    Maxcine HamLaura Paiewonsky, M.A. CCC-SLP [phone]

## 2016-09-08 LAB — CBC
HCT: 35.3 % — ABNORMAL LOW (ref 39.0–52.0)
Hemoglobin: 11.4 g/dL — ABNORMAL LOW (ref 13.0–17.0)
MCH: 30 pg (ref 26.0–34.0)
MCHC: 32.3 g/dL (ref 30.0–36.0)
MCV: 92.9 fL (ref 78.0–100.0)
PLATELETS: 113 10*3/uL — AB (ref 150–400)
RBC: 3.8 MIL/uL — AB (ref 4.22–5.81)
RDW: 15 % (ref 11.5–15.5)
WBC: 11.9 10*3/uL — AB (ref 4.0–10.5)

## 2016-09-08 LAB — GLUCOSE, CAPILLARY
GLUCOSE-CAPILLARY: 139 mg/dL — AB (ref 65–99)
Glucose-Capillary: 136 mg/dL — ABNORMAL HIGH (ref 65–99)
Glucose-Capillary: 138 mg/dL — ABNORMAL HIGH (ref 65–99)
Glucose-Capillary: 151 mg/dL — ABNORMAL HIGH (ref 65–99)
Glucose-Capillary: 151 mg/dL — ABNORMAL HIGH (ref 65–99)

## 2016-09-08 LAB — CULTURE, RESPIRATORY W GRAM STAIN

## 2016-09-08 LAB — BASIC METABOLIC PANEL
Anion gap: 4 — ABNORMAL LOW (ref 5–15)
BUN: 18 mg/dL (ref 6–20)
CALCIUM: 8 mg/dL — AB (ref 8.9–10.3)
CO2: 29 mmol/L (ref 22–32)
CREATININE: 0.39 mg/dL — AB (ref 0.61–1.24)
Chloride: 104 mmol/L (ref 101–111)
GFR calc non Af Amer: >60 mL/min (ref >60)
Glucose, Bld: 171 mg/dL — ABNORMAL HIGH (ref 65–99)
Potassium: 3.8 mmol/L (ref 3.5–5.1)
SODIUM: 137 mmol/L (ref 135–145)

## 2016-09-08 LAB — CULTURE, RESPIRATORY

## 2016-09-08 MED ORDER — FUROSEMIDE 10 MG/ML IJ SOLN
40.0000 mg | Freq: Once | INTRAMUSCULAR | Status: AC
  Administered 2016-09-08: 40 mg via INTRAVENOUS
  Filled 2016-09-08: qty 4

## 2016-09-08 MED ORDER — FAMOTIDINE IN NACL 20-0.9 MG/50ML-% IV SOLN: 20.0000 mg | Freq: Two times a day (BID) | INTRAVENOUS | Status: DC

## 2016-09-08 MED ORDER — INSULIN ASPART 100 UNIT/ML ~~LOC~~ SOLN: 3.0000 [IU] | SUBCUTANEOUS | 11 refills | Status: DC

## 2016-09-08 MED ORDER — PREDNISONE 5 MG PO TABS: 5.0000 mg | ORAL_TABLET | Freq: Every day | ORAL | Status: DC

## 2016-09-08 MED ORDER — INSULIN GLARGINE 100 UNIT/ML ~~LOC~~ SOLN: 18.0000 [IU] | Freq: Every day | SUBCUTANEOUS | 11 refills | Status: DC

## 2016-09-08 MED ORDER — PREDNISONE 5 MG PO TABS
5.0000 mg | ORAL_TABLET | Freq: Every day | ORAL | Status: DC
  Administered 2016-09-08: 5 mg via NASOGASTRIC
  Filled 2016-09-08 (×2): qty 1

## 2016-09-08 MED ORDER — CHLORHEXIDINE GLUCONATE 0.12% ORAL RINSE (MEDLINE KIT): 15.0000 mL | Freq: Two times a day (BID) | OROMUCOSAL | 0 refills | Status: DC

## 2016-09-08 MED ORDER — VITAL AF 1.2 CAL PO LIQD: 1000.0000 mL | ORAL | Status: DC

## 2016-09-08 MED ORDER — INSULIN ASPART 100 UNIT/ML ~~LOC~~ SOLN: 0.0000 [IU] | SUBCUTANEOUS | 11 refills | Status: DC

## 2016-09-08 MED ORDER — ENOXAPARIN SODIUM 40 MG/0.4ML ~~LOC~~ SOLN: 40.0000 mg | SUBCUTANEOUS | Status: DC

## 2016-09-08 MED ORDER — LEVALBUTEROL HCL 1.25 MG/0.5ML IN NEBU: 1.2500 mg | INHALATION_SOLUTION | Freq: Three times a day (TID) | RESPIRATORY_TRACT | 12 refills | Status: DC

## 2016-09-08 MED ORDER — FENTANYL CITRATE (PF) 100 MCG/2ML IJ SOLN: 25.0000 ug | INTRAMUSCULAR | 0 refills | Status: DC | PRN

## 2016-09-08 MED ORDER — ORAL CARE MOUTH RINSE: 15.0000 mL | Freq: Four times a day (QID) | OROMUCOSAL | 0 refills | Status: DC

## 2016-09-08 MED ORDER — POLYETHYLENE GLYCOL 3350 17 G PO PACK: 17.0000 g | PACK | Freq: Every day | ORAL | 0 refills | Status: DC | PRN

## 2016-09-08 MED ORDER — SODIUM CHLORIDE 0.9% FLUSH: 10.0000 mL | Freq: Two times a day (BID) | INTRAVENOUS | Status: DC

## 2016-09-08 MED ORDER — IPRATROPIUM BROMIDE 0.02 % IN SOLN: 0.5000 mg | Freq: Three times a day (TID) | RESPIRATORY_TRACT | 12 refills | Status: DC

## 2016-09-08 NOTE — Progress Notes (Signed)
PULMONARY / CRITICAL CARE MEDICINE   Name: Bradley [name] MRN: [id number] DOB: [birth year]    ADMISSION DATE:  08/17/2016 CONSULTATION DATE:  08/19/16  REFERRING MD:  Dr Thayer Ohm Rama  CHIEF COMPLAINT:  Acute resp failure with hypoxemia  BRIEF 55 year old smoker, with PFT nov 2016 showing isolated reduction in dlco to 59% and quite functional and not on home o2. Followed by Dr Sherene Sires -0 last seen Sept/Nov 2016 based on CT chest evidence showing diffuse changes of centrilobular and paraseptal emphysema along with prominent interstitial markings primarily in the upper lobes consistent with pulmonary fibrosis. This was a non--UIP pattern. Patient recommended quitting smoking because of concerns of DIP. According to the patient and his wife, has continued to smoke. He has been in his baseline status of health and very functional. Then on 08/14/2016 he had a chest x-ray preoperative that only showed chronic changes without change for a right shoulder surgery rotator cuff tear. The procedure was uneventful. Postoperative course was uneventful. There was no vomiting. Then abruptly on 08/16/2016 he started getting acutely ill with shortness of breath and cough with brown colored sputum. Then admitted to the hospital. Initial white count was 13,000. BNP was normal. CT angiogram chest ruled out pulmonary embolism but showed diffuse ground glass opacities and enlarged mediastinal node measuring close to 2 cm. He was started on community acquired pneumonia antibiotics but has developed progressive acute respiratory failure necessitating Solu-Medrol since 08/18/2016 evening. And from the early hours of 08/19/2016 he's been on BiPAP with significant respiratory distress and therefore pulmonary critical care has been consulted.  At baseline he denies any mold exposure or mildew exposure or working with dust. He denies any birds in the house. He continues to smoke.   Events: 08/17/2016 - admit 08/18/2016-respiratory  virus panel, HIV and flu panel PCR negative 08/19/16 -bipap, move to icu -> intubated 10/21 10/22 repeat RVP from trach aspirate - negative 10/23 levo off 10/24 nimbex off 10/25 --> RR adjusted from 35 to 30 overnight due to respiratory acidosis. Did not improve gas, so RR decreased to 30 again. P/F ratios of 275 and 171, respectively.  10/26 --> ABGs improved on pressure control vent settings. FiO2 increased from 40 to 60 overnight due to desaturations.  10/29 - beginning to wean 10/30 - Fever to 100.8 F yesterday 10/31 - Fever to 100.9 F overnight. Not tolerating wean of sedation without becoming very asynchronous with vent. On 20 of PC above PEEP.  11/1- Fever to 101.9 this morning. Very obtunded. MRI brain obtained and negative.  11/2- OG tube out of place this morning. Off all sedating medications. EEG :  moderate generalized slowing of brain activity.  09/01/16 =Febrile overnight to 102.1. Still very obtunded without spontaneous movements. 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  Dx of toxic metabolic encephalopathy 09/02/16 - shaking head wich is an improvement but overall flaccid. 30% fio2. Spiked temp 102.12F despite antibiotics.  Na astill 150 09/03/16- Trach aspirate growing abundant GNR. Fever curve improved. More awake and following some command with increased limb movement.  09/04/16: PDT placed. Repeat MRI for continued encephalopathy: no acute infarct or intracranial hemorrhage. EEG repeated with mild diffuse slowing of electrocerebral activity without epileptiform activity.  09/05/16: Hemoptysis without tracheal blood. No further bleeding and HgB stable.  09/06/16: Transitioned to trach collar. LP unremarkable. MRI C-spine without compression.  09/07/16: Ready for discharge to [facility]  SUBJECTIVE/OVERNIGHT/INTERVAL HX 09/08/16:  [facility] for abundant enterobacter aerogenes resistant to  Zosyn. On trach collar overnight.   VITAL SIGNS: BP 111/74   Pulse 81   Temp 98.1 F (36.7 C) (Oral)   Resp (!) 23   Ht 5\' 9"  (1.753 m)   Wt 212 lb (96.2 kg)   SpO2 96%   BMI 31.31 kg/m   HEMODYNAMICS:    Lines: Foley 10/21 >> ETT and OG 10/21 >>11/6 PDT 11/6 >>  CVC 10/22 >> PIV x 3 A-line left radial 10/22 >>>10/30  VENTILATOR SETTINGS: FiO2 (%):  [28 %-40 %] 28 %  INTAKE / OUTPUT: I/O last 3 completed shifts: In: 2989.1 [I.V.:330; NG/GT:2309.1; IV Piggyback:350] Out: 2500 [Urine:2470; Emesis/NG output:30]    PHYSICAL EXAMINATION: General:  Middle-aged, alert and awake  Neuro: Eyes open. Tracks to voice. Follows commands. Strength improving in extremities but still globally weak.  HEENT:  Trach collar in place.  Cardiovascular:  Regular rate and rhythm Lungs: Decreased BS at bases Abdomen:  Soft nontender Musculoskeletal:  No cyanosis no clubbing no edema. Looks euvolemic; clean dry bandage over R shoulder Skin:  Intact on exposed areas. Extremities with good pedal pulses and equal in warmth.  LABS:  PULMONARY No results for input(s): PHART, PCO2ART, PO2ART, HCO3, TCO2, O2SAT in the last 168 hours.  Invalid input(s): PCO2, PO2 CBC  Recent Labs Lab 09/06/16 0440 09/07/16 0514 09/08/16 0500  HGB 11.2* 11.9* 11.4*  HCT 34.9* 37.2* 35.3*  WBC 14.4* 14.0* 11.9*  PLT 143* 119* 113*   COAGULATION  Recent Labs Lab 09/03/16 0505  INR 1.03   CARDIAC   No results for input(s): TROPONINI in the last 168 hours. No results for input(s): PROBNP in the last 168 hours.  CHEMISTRY  Recent Labs Lab 09/02/16 0500 09/03/16 0505 09/04/16 0445 09/05/16 0340 09/06/16 0440 09/07/16 0514 09/07/16 0630 09/08/16 0500  NA 150* 146* 148* 147* 143 140  --  137  K 3.5 3.5 3.8 4.0 3.9 3.8  --  3.8  CL 114* 111 113* 111 111 108  --  104  CO2 28 29 29 27 25 27   --  29  GLUCOSE 153* 197* 92 216* 212* 151*  --  171*  BUN 43* 30* 25* 23* 22* 21*  --  18   CREATININE 0.76 0.61 0.59* 0.57* 0.47* 0.44*  --  0.39*  CALCIUM 8.1* 7.7* 7.8* 7.8* 8.0* 8.0*  --  8.0*  MG 2.6* 2.4 2.4 2.5* 2.4  --  2.2  --   PHOS 4.3 2.8 3.2 3.0 3.2  --   --   --    Estimated Creatinine Clearance: 119.4 mL/min (by C-G formula based on SCr of 0.39 mg/dL (L)).  LIVER  Recent Labs Lab 09/01/16 0929 09/03/16 0505  AST 32  --   ALT 62  --   ALKPHOS 31*  --   BILITOT 0.7  --   PROT 5.3*  --   ALBUMIN 2.3*  --   INR  --  1.03   INFECTIOUS  Recent Labs Lab 09/01/16 0929 09/01/16 0930 09/01/16 1201 09/02/16 0005 09/02/16 0500 09/03/16 0505  LATICACIDVEN  --  1.3 2.4* 1.1  --   --   PROCALCITON 0.15  --   --   --  0.24 0.33   ENDOCRINE CBG (last 3)   Recent Labs  09/07/16 1958 09/07/16 2352 09/08/16 0349  GLUCAP 150* 116* 136*   IMAGING x48h  - image(s) personally visualized  -   highlighted  in bold Mr Cervical Spine Wo Contrast  Result Date: 09/06/2016 CLINICAL DATA:  Weakness. EXAM: MRI CERVICAL SPINE WITHOUT CONTRAST TECHNIQUE: Multiplanar, multisequence MR imaging of the cervical spine was performed. No intravenous contrast was administered. COMPARISON:  None. FINDINGS: Image quality degraded by moderate motion. The patient was moving on all sequences. Alignment: Normal alignment. Straightening of the cervical lordosis. Vertebrae: Negative for fracture or mass. Cord: Limited evaluation of the cord due to motion. No cord lesion identified. Posterior Fossa, vertebral arteries, paraspinal tissues: Negative Disc levels: C2-3:  Negative C3-4:  Negative C4-5:  Negative C5-6: Disc degeneration and spondylosis. Diffuse uncinate spurring with moderate left foraminal narrowing and mild right foraminal narrowing. No cord deformity C6-7: Disc degeneration and spondylosis. Mild spinal and foraminal stenosis bilaterally C7-T1: Negative IMPRESSION: Image quality degraded by significant motion Cervical degenerative changes appear chronic and are most prominent at  C5-6. There is moderate left foraminal narrowing at C5-6 due to spurring and mild right foraminal narrowing. Mild spondylosis also at C6-7. Electronically Signed   By: Marlan Palau M.D.   On: 09/06/2016 16:39   Dg Fluoro Guide Lumbar Puncture  Result Date: 09/06/2016 CLINICAL DATA:  Altered mental status. COPD. Acute respiratory distress syndrome. EXAM: DIAGNOSTIC LUMBAR PUNCTURE UNDER FLUOROSCOPIC GUIDANCE FLUOROSCOPY TIME:  Fluoroscopy Time:  2 minutes and 54 seconds Number of Acquired Spot Images: 0 PROCEDURE: Informed consent was obtained from the patient's wife prior to the procedure. A "Time out" was performed. With the patient prone, the lower back was prepped with Betadine. 1% Lidocaine was used for local anesthesia. Lumbar puncture was performed at the L3/4 level using a 20 gauge needle with return of clear CSF. Opening pressure could not be determined secondary to low pressure and patient's clinical status. Intracranial pressure not elevated. 10.5 ml of CSF were obtained for laboratory studies. The patient tolerated the procedure well and there were no apparent complications. IMPRESSION: Uncomplicated lumbar puncture under fluoroscopic guidance, as detailed above. Electronically Signed   By: Jeronimo Greaves M.D.   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 No known ILD exposures per hx  #Admit 08/17/2016 - Abrupt respiratory illness with acute hypoxemic respiratory failure with significant deterioration 3 days after right shoulder surgery. Pulmonary embolism ruled out. Diffuse ground glass opacities on CT chest at admission  - Differential diagnosis includes: Undiagnosed IPF flare, acute lung injury postop idiopathic  - Rx with ARDS net, nimbex through 08/22/16 and steroids (BAL 10/24  -non diag, normal autoimmne, RVP)  #Current 09/08/2016  - Trach collar in place   P:   Solumedrol 5 daily (day 20), reduced 09/08/16 - would dc this by d21 Ipratroprium and xopenex q6h Keep even to neg  CARDIOVASCULAR A:  MI ruled out but troponin increased to 0.07. Suspect demand. ECHO ok 10/22 - started on levophed, discontinued 10/23   P:  Monitor tele  RENAL A:   Nil acute  P:   Replace electrolytes as indicated. Follow BMET Lasix 40   GASTROINTESTINAL A:   Nil acute P:   NPO except meds TFs per nutrition PPI SLP  HEMATOLOGIC A:   At risk for anemia of critical illness Leukocytosis persistent, improving   P:  Monitor - stable. DVT prophylaxis - lovenox  INFECTIOUS Blood Cx 11/3: NGTD Urine Cx 11/3: NGTD Trach Cx 11/3: Abundant  GNR, Abundant Enterobacter Aerogenes  A:   Trach Cx with abundant enterobacter aerogenes resistant to Zosyn, had elected not to treat further with abx due to improving clinical status   P:   Follow fever curve WBC Tylenol for fevers  > 102F Meropenem started by Elink, will likely d/c   Abx: Zosyn 10/21>>>10/30, 11/3 >> 11/7 Vanc 10/21>>>10/26, 11/3 >> 11/5 Meropenem 11/10 >>   ENDOCRINE A:   At risk for hyperglycemia  P:   Lantus increased to 18u qhs and moderate SSI  Increase Novolog to 3u q4h (hold if tube feeds held)  Solumedrol 5 daily (day 20), reduced 09/08/16 - would dc this by d21  NEUROLOGIC A:   Toxic metabolic encephalopathy with flaccidity 09/02/16 - critical illness myoneuropathy (encephalopathy resolved)  Pineal lesion no causing significant compression of superior colliculus. Radiology recommends repeat MRI in 3-6 months to confirm stability of sooner if patient develops diplopia with upward gaze.  Weakness, deconditioning primarily complicated by steroids    P:   Monitor RASS goal: 0 Avoid opiopids, benzos Neurology signed off  Weaning steroids  PT  LTACH placement    FAMILY  - no family  at bedside 11/10   [facility]    Marcy Sirenatherine Wallace, D.O. 09/08/2016, 6:50 AM PGY-2,  Family Medicine   STAFF NOTE: I, Rory Percyaniel Kristianna Saperstein, MD FACP have personally reviewed patient's available data, including medical history, events of note, physical examination and test results as part of my evaluation. I have discussed with resident/NP and other care providers such as pharmacist, RN and RRT. In addition, I personally evaluated patient and elicited key findings of: awka,e alert, trach clean, lungs distant to clear, secretion slow, offvent 48 hrs, drop ciff, add PMV, get slp, I do not believe that the enterobacter was a pathogen as he did not receive coverage per sens and rapidly improved , dc all abx, follow clinical course, reduce steroids as able, PT, to sdu, to otriad, stay as pccm for trach  Mcarthur RossettiDaniel J. Tyson AliasFeinstein, MD, FACP Pgr: [phone] [facility] 09/08/2016 11:21 AM

## 2016-09-08 NOTE — Progress Notes (Signed)
Physical Therapy Treatment Patient Details Name: Bradley FraiseDavid M [name] MRN: [id number] DOB: 06-Nov-1960 Today's Date: 09/08/2016    History of Present Illness 55 yo admitted on 10/19 with SOB after RCR and bicep tendon repair on 10/16. Intubated 10/21 with trach 11/6, metabolic encephalopathy. PMhx: asthma, COPD, pulmonary fibrosis    PT Comments    Pt progressing with mobility, able to interact today, squeeze bil hands and move legs. Pt noted to have all extremity weakness with no AROM of bil hip flexors, grossly 2-/5 for knee flexion and extension. Pt without notable movement of bil UE beyond grip. Pt's wife present throughout session. Pt educated for need to increase strength and mobility with encouragement to move as much as possible. Pt with fatigue EOB although sats remained 98% on trach collar 28% FiO2 and BP 116/88 EOB. Will continue to follow.   Follow Up Recommendations  LTACH;Supervision/Assistance - 24 hour     Equipment Recommendations       Recommendations for Other Services       Precautions / Restrictions Precautions Precautions: Fall Precaution Comments: panda, trach, rectal pouch    Mobility  Bed Mobility Overal bed mobility: Needs Assistance Bed Mobility: Supine to Sit;Sit to Supine;Rolling Rolling: Max assist   Supine to sit: Total assist;+2 for physical assistance;HOB elevated Sit to supine: Total assist;+2 for physical assistance   General bed mobility comments: total assist to pivot to EOB with 2 person assist. pt able to sit EOB 7 min today with variation in max to min assist for sitting balance. Total assist to pivot back to bed and [facility]  Transfers                 General transfer comment: unable at this time due to fatigue  Ambulation/Gait                 Stairs            Wheelchair Mobility    Modified Rankin (Stroke Patients Only)       Balance Overall balance assessment: Needs assistance   Sitting balance-Leahy  Scale: Poor Sitting balance - Comments: EOB 7 min with variation of max to min assist, cues for propping with LUE, anterior and posterior lean throughout session                            Cognition Arousal/Alertness: Awake/alert Behavior During Therapy: Flat affect Overall Cognitive Status: Difficult to assess Area of Impairment: Orientation;Memory Orientation Level: Disoriented to;Place;Time   Memory: Decreased short-term memory              Exercises General Exercises - Lower Extremity Hip Flexion/Marching: PROM;Both;5 reps;Seated    General Comments        Pertinent Vitals/Pain Pain Assessment: No/denies pain    Home Living                      Prior Function            PT Goals (current goals can now be found in the care plan section) Progress towards PT goals: Progressing toward goals    Frequency           PT Plan Current plan remains appropriate    Co-evaluation             End of Session   Activity Tolerance: Patient tolerated treatment well Patient left: in bed;with call bell/phone within reach;with bed alarm set  Time: [id number] PT Time Calculation (min) (ACUTE ONLY): 34 min  Charges:  $Therapeutic Activity: 23-37 mins                    G Codes:      Bradley [name], Bradley [name] 09/08/2016, 11:50 AM  Bradley [name], PT [phone]

## 2016-09-09 ENCOUNTER — Other Ambulatory Visit (HOSPITAL_COMMUNITY): Payer: BLUE CROSS/BLUE SHIELD

## 2016-09-09 ENCOUNTER — Inpatient Hospital Stay (HOSPITAL_COMMUNITY)
Admission: RE | Admit: 2016-09-09 | Discharge: 2016-09-15 | Disposition: A | Discharge status: Rehabilitation Facility | Payer: BLUE CROSS/BLUE SHIELD | Source: Ambulatory Visit | Attending: Internal Medicine | Admitting: Internal Medicine

## 2016-09-09 DIAGNOSIS — Z0189 Encounter for other specified special examinations: Secondary | ICD-10-CM

## 2016-09-09 DIAGNOSIS — J9690 Respiratory failure, unspecified, unspecified whether with hypoxia or hypercapnia: Secondary | ICD-10-CM

## 2016-09-09 LAB — BASIC METABOLIC PANEL
Anion gap: 8 (ref 5–15)
BUN: 22 mg/dL — AB (ref 6–20)
CALCIUM: 8.4 mg/dL — AB (ref 8.9–10.3)
CHLORIDE: 104 mmol/L (ref 101–111)
CO2: 26 mmol/L (ref 22–32)
CREATININE: 0.41 mg/dL — AB (ref 0.61–1.24)
Glucose, Bld: 110 mg/dL — ABNORMAL HIGH (ref 65–99)
Potassium: 3.9 mmol/L (ref 3.5–5.1)
SODIUM: 138 mmol/L (ref 135–145)

## 2016-09-09 LAB — CBC
HCT: 38 % — ABNORMAL LOW (ref 39.0–52.0)
Hemoglobin: 12.6 g/dL — ABNORMAL LOW (ref 13.0–17.0)
MCH: 30.5 pg (ref 26.0–34.0)
MCHC: 33.2 g/dL (ref 30.0–36.0)
MCV: 92 fL (ref 78.0–100.0)
PLATELETS: 116 10*3/uL — AB (ref 150–400)
RBC: 4.13 MIL/uL — AB (ref 4.22–5.81)
RDW: 15 % (ref 11.5–15.5)
WBC: 11.7 10*3/uL — AB (ref 4.0–10.5)

## 2016-09-10 LAB — CBC
HEMATOCRIT: 39.7 % (ref 39.0–52.0)
HEMOGLOBIN: 13.3 g/dL (ref 13.0–17.0)
MCH: 30.4 pg (ref 26.0–34.0)
MCHC: 33.5 g/dL (ref 30.0–36.0)
MCV: 90.8 fL (ref 78.0–100.0)
Platelets: 123 10*3/uL — ABNORMAL LOW (ref 150–400)
RBC: 4.37 MIL/uL (ref 4.22–5.81)
RDW: 14.7 % (ref 11.5–15.5)
WBC: 10.5 10*3/uL (ref 4.0–10.5)

## 2016-09-10 LAB — CSF CULTURE W GRAM STAIN

## 2016-09-10 LAB — CSF CULTURE: CULTURE: NO GROWTH

## 2016-09-11 DIAGNOSIS — J9601 Acute respiratory failure with hypoxia: Secondary | ICD-10-CM | POA: Diagnosis not present

## 2016-09-11 DIAGNOSIS — Z930 Tracheostomy status: Secondary | ICD-10-CM

## 2016-09-11 DIAGNOSIS — R5381 Other malaise: Secondary | ICD-10-CM | POA: Diagnosis not present

## 2016-09-11 DIAGNOSIS — R0902 Hypoxemia: Secondary | ICD-10-CM

## 2016-09-11 LAB — BASIC METABOLIC PANEL
ANION GAP: 11 (ref 5–15)
BUN: 18 mg/dL (ref 6–20)
CHLORIDE: 100 mmol/L — AB (ref 101–111)
CO2: 27 mmol/L (ref 22–32)
Calcium: 8.5 mg/dL — ABNORMAL LOW (ref 8.9–10.3)
Creatinine, Ser: 0.43 mg/dL — ABNORMAL LOW (ref 0.61–1.24)
GFR calc Af Amer: >60 mL/min (ref >60)
GFR calc non Af Amer: >60 mL/min (ref >60)
GLUCOSE: 180 mg/dL — AB (ref 65–99)
POTASSIUM: 3.9 mmol/L (ref 3.5–5.1)
Sodium: 138 mmol/L (ref 135–145)

## 2016-09-11 NOTE — Consult Note (Signed)
Name: Bradley FraiseDavid M Signor MRN: [id number] DOB: [birth year]    ADMISSION DATE:  09/09/2016 CONSULTATION DATE:  11/13  REFERRING MD :  Sharyon MedicusHijazi   CHIEF COMPLAINT:  Trach care, pulmonary medicine.   BRIEF PATIENT DESCRIPTION:  [id number] year old male w/ sig h/o IPF (non-UIP pattern) just discharged from [facility] after being admitted for acute hypoxic resp failure felt d/t either IPF flare vs post-op ALI vs HCAP (shoulder surgery 2d prior to admit). Treated w/ lung protective ventilation, broad spec abx, and also NMB and steroids. Completed abx. Had normal auto-immune studies. Course complicated by myeneurotapthy, weakness and failure to wean. Eventually had trach and [facility]. PCCM asked to assist w/ care   SIGNIFICANT EVENTS    STUDIES:    HISTORY OF PRESENT ILLNESS:    [id number] year old smoker, with PFT nov 2016 showing isolated reduction in dlco to 59% and quite functional and not on home o2. Followed by Dr Sherene SiresWert -0 last seen Sept/Nov 2016 based on CT chest evidence showing diffuse changes of centrilobular and paraseptal emphysema along with prominent interstitial markings primarily in the upper lobes consistent with pulmonary fibrosis. This was a non--UIP pattern. Patient recommended quitting smoking because of concerns of DIP. According to the patient and his wife, has continued to smoke. He has been in his baseline status of health and very functional.   On 08/14/2016, patient had a preoperative chest x-ray for shoulder surgery that only showed chronic changes without change. He had right shoulder arthroscopy, debridement and open rotator cuff repair for right shoulder partial biceps tendon tear and complete rotator cuff tear. The procedure was uneventful. Postoperative course was uneventful. There was no vomiting.   On 08/16/2016, patient started getting acutely ill with shortness of breath and cough with brown colored sputum. Then admitted to the hospital. Initial white count was 13,000. BNP was  normal. CT angiogram chest ruled out pulmonary embolism but showed diffuse ground glass opacities and enlarged mediastinal node measuring close to 2 cm.  He was started on community acquired pneumonia antibiotics but had developed progressive acute respiratory failure. On 08/19/2016,  he was started on BiPAP due to significant respiratory distress. However, patient continue to have respiratory distress on BIPAP and transferred to ICU and got intubated. started on ventilation for ARDS net protocol with Nimbex. Nimbex discontinued on 08/22/2016. Although his acidosis improved, he could not come off mechanical ventilation completely which necessitated percutaneous tracheostomy on 09/04/2016.  As of 11/6, patient is on tracheostomy collar during the daytime and is no longer requiring pressure control ventilation at night. [facility]10 for continued pulmonary support and rehab efforts.   PAST MEDICAL HISTORY :   has a past medical history of Anxiety; Asthma; COPD (chronic obstructive pulmonary disease) (HCC); Dyspnea; History of kidney stones; Pneumonia; and Pulmonary fibrosis (HCC).  has a past surgical history that includes Multiple tooth extractions; Tonsillectomy; Lithotripsy; Colonoscopy w/ polypectomy; and Shoulder arthroscopy with rotator cuff repair (Right, 08/14/2016). Prior to Admission medications   Medication Sig Start Date End Date Taking? Authorizing Provider  albuterol (PROVENTIL HFA;VENTOLIN HFA) 108 (90 BASE) MCG/ACT inhaler Inhale 1 puff into the lungs every 6 (six) hours as needed for wheezing or shortness of breath.    Historical Provider, MD  citalopram (CELEXA) 20 MG tablet Take 30 mg by mouth every morning.     Historical Provider, MD  enoxaparin (LOVENOX) 40 MG/0.4ML injection Inject 0.4 mLs (40 mg total) into the skin daily. 09/08/16  Tobey Grim, NP  famotidine (PEPCID) 20-0.9 MG/50ML-% Inject 50 mLs (20 mg total) into the vein every 12 (twelve) hours.  09/08/16   Tobey Grim, NP  fexofenadine-pseudoephedrine (ALLEGRA-D) 60-120 MG 12 hr tablet Take 1 tablet by mouth every morning.     Historical Provider, MD  Fluticasone-Salmeterol (ADVAIR) 250-50 MCG/DOSE AEPB Inhale 1 puff into the lungs 2 (two) times daily.    Historical Provider, MD  ibuprofen (ADVIL,MOTRIN) 200 MG tablet Take 600 mg by mouth every 6 (six) hours as needed for mild pain.    Historical Provider, MD  insulin aspart (NOVOLOG) 100 UNIT/ML injection Inject 0-15 Units into the skin every 4 (four) hours. 09/08/16   Tobey Grim, NP  insulin aspart (NOVOLOG) 100 UNIT/ML injection Inject 3 Units into the skin every 4 (four) hours. 09/08/16   Tobey Grim, NP  insulin glargine (LANTUS) 100 UNIT/ML injection Inject 0.18 mLs (18 Units total) into the skin at bedtime. 09/08/16   Arvilla Market, DO  ipratropium (ATROVENT) 0.02 % nebulizer solution Take 2.5 mLs (0.5 mg total) by nebulization 3 (three) times daily. 09/08/16   Arvilla Market, DO  levalbuterol (XOPENEX) 1.25 MG/0.5ML nebulizer solution Take 1.25 mg by nebulization 3 (three) times daily. 09/08/16   Arvilla Market, DO  mouth rinse LIQD solution 15 mLs by Mouth Rinse route QID. 09/08/16   Arvilla Market, DO  Nutritional Supplements (FEEDING SUPPLEMENT, VITAL AF 1.2 CAL,) LIQD Place 1,000 mLs into feeding tube continuous. 09/08/16   Arvilla Market, DO  polyethylene glycol [facility] / GLYCOLAX) packet Take 17 g by mouth daily as needed for moderate constipation. 09/08/16   Tobey Grim, NP  predniSONE (DELTASONE) 5 MG tablet 1 tablet (5 mg total) by Per NG tube route daily with breakfast. 09/09/16   Arvilla Market, DO   Allergies  Allergen Reactions  . Wellbutrin [Bupropion] Cough    FAMILY HISTORY:  family history includes Colon cancer in his mother; Heart disease in his mother. SOCIAL HISTORY:  reports that he has been smoking Cigarettes.  He  has a 30.00 pack-year smoking history. He has never used smokeless tobacco. He reports that he does not drink alcohol or use drugs.  REVIEW OF SYSTEMS:   Constitutional: Negative for fever, chills, weight loss, malaise + fatigue and diaphoresis.  HENT: Negative for hearing loss, ear pain, nosebleeds, congestion, sore throat, neck pain, tinnitus and ear discharge.   Eyes: Negative for blurred vision, double vision, photophobia, pain, discharge and redness.  Respiratory: Negative for cough, hemoptysis, sputum production, + shortness of breath, wheezing and stridor.   Cardiovascular: Negative for chest pain, palpitations, orthopnea, claudication, leg swelling and PND.  Gastrointestinal: Negative for heartburn, nausea, vomiting, abdominal pain, diarrhea, constipation, blood in stool and melena.  Genitourinary: Negative for dysuria, urgency, frequency, hematuria and flank pain.  Musculoskeletal: Negative for myalgias, back pain, joint pain and falls. + right shoulder pain  Skin: Negative for itching and rash.  Neurological: Negative for dizziness, tingling, tremors, sensory change, speech change, focal weakness, seizures, loss of consciousness, weakness and headaches.  Endo/Heme/Allergies: Negative for environmental allergies and polydipsia. Does not bruise/bleed easily.  SUBJECTIVE:  No distress.  VITAL SIGNS:  98.0 hr 94 rr 25 bp 124/73 sats 98% on atc (40%)  PHYSICAL EXAMINATION: General:  55 year old male, resting in bed. C/o rightshoulder/arm discomforts Neuro:  Awake, alert, generalized weakness. Right UE weaker than left HEENT:  #6 trach PMV in place. Strong phonation no sig  secretions  Cardiovascular:  RRR w/out MRG Lungs:  Fine crackles bases. occ rhonchi. No accessory use  Abdomen:  Soft not tender + bowel sounds  Musculoskeletal:  RUE weak.  Skin:  Warm and intact    Recent Labs Lab 09/08/16 0500 09/09/16 0636 09/11/16 0500  NA 137 138 138  K 3.8 3.9 3.9  CL 104 104 100*    CO2 29 26 27   BUN 18 22* 18  CREATININE 0.39* 0.41* 0.43*  GLUCOSE 171* 110* 180*    Recent Labs Lab 09/08/16 0500 09/09/16 0636 09/10/16 0639  HGB 11.4* 12.6* 13.3  HCT 35.3* 38.0* 39.7  WBC 11.9* 11.7* 10.5  PLT 113* 116* 123*   No results found.  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 Doing better. Completed steroids. Now w/ #6 cuffed trach (cuff is deflated). Doing well w/ PMV. Has planned swallow eval for 11/14. 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   Plan Cont atc w/ PMV during day time SLP following We will see twice a week.  Hope that once he is a little stronger we can start capping trials and evaluate eventually for decannulation.   Simonne MartinetPeter E Babcock ACNP-BC [facility] Pulmonary/Critical Care Pager # [phone] OR # [phone] if no answer  Attending Note:  55 year old male with acute on chronic hypoxemic respiratory failure due to ILD flare with ARDS and HCAP who is now trached and now on trach collar with PMV during the day.  On exam, coarse BS diffusely with PMV in place.  I reviewed CXR myself, trach in good position.  Discussed with RT and PCCM-NP.  Respiratory failure:  - Treat HCAP  - Keep as dry as able.  Tracheostomy status:  - Begin capping trials  - If more improved from a physical standpoint may consider decannulation.  Hypoxemia:  - Titrate O2 for sat of 88-92%.  Severe deconditioning:  - Physical therapy.  - Dietary support  - OOB to chair.  Patient seen and examined, agree with above note.  I dictated the care and orders written for this patient under my direction.  Alyson ReedyWesam G Yacoub, MD [phone]  09/11/2016, 2:24 PM

## 2016-09-12 ENCOUNTER — Telehealth (INDEPENDENT_AMBULATORY_CARE_PROVIDER_SITE_OTHER): Payer: Self-pay

## 2016-09-12 ENCOUNTER — Other Ambulatory Visit (HOSPITAL_COMMUNITY): Payer: BLUE CROSS/BLUE SHIELD

## 2016-09-12 NOTE — Telephone Encounter (Signed)
Put a sling on . We can see him in 3 wks.

## 2016-09-12 NOTE — Telephone Encounter (Signed)
Fayrene FearingJames calling stating patient came into [facility] and he didn't have orders nor did he have a sling on from his surgery on his right shoulder. Please let him know what he is to do?

## 2016-09-13 NOTE — Telephone Encounter (Signed)
See below

## 2016-09-14 NOTE — PMR Pre-admission (Signed)
[facility] PMR Admission Coordinator Pre-Admission Assessment  Patient: Bradley [name] is an 55 y.o., male MRN: [id number] DOB: April 15, 1961 Height: '5\' 9"'$  (175.3 cm) Weight: 79.5 kg (175 lb 3.2 oz)  Insurance Information HMO:     PPO: X     PCP:      IPA:      80/20:      OTHER:  PRIMARY: BCBS [place]      Policy#: [id number]      Subscriber: Self CM Name: Lauraine Rinne      Phone#: [phone]     Fax#: [phone] Pre-Cert#: [id number]      Employer: [employer] Benefits:  Phone #: [phone]     Name: Haynes Kerns. Date: 10/31/15     Deduct: $750      Out of Pocket Max: $4,000      Life Max: none CIR: 20% co-insurance after deductible       SNF:  20% co-insurance after deductible, 60 day limit Outpatient:  30 visit limits      Co-Pay:  20% co-insurance after deductible Home Health: medical necessity with prior authorization       Co-Pay:  20% co-insurance after deductible DME: 80%     Co-Pay:  20% co-insurance after deductible Providers: in-network   Medicaid Application Date:       Case Manager:  Disability Application Date:       Case Worker:   Emergency Facilities manager Information    Name Relation Home Work Martell Spouse [phone]  [phone]      Current Medical History  Patient Admitting Diagnosis: Deconditioning with muscle weakness and trach after acute respiratory failure  History of Present Illness: Bradley [name] is a 55 year old male with h/o of COPD--ongoing tobacco use, interstitial pulmonary fibrosis,  DOE, anxiety disorder, right shoulder arthroscopy 08/14/16 by Dr. Lorin Mercy who developed progressive SOB with productive cough and was admitted on 08/16/16 with acute hypoxic/hypercarbic respiratory failure due to IPF v/sp post ALI v/s HCAP. He was treated with broad spectrum antibiotics and steroids but progressed to VDRF requiring tracheostomy 11/6. He continued to require vent support for rest. Hospital course signifiant for  encephalopathy and  critical illness myopathy with poor activity tolerance. He was transferred to [facility] for vent wean and rehab on 09/08/16.  Patient will admit to IP Rehab 09/15/16.   Patient[facility] from [facility] has been reviewed by the rehabilitation admission coordinator and physician.  Past Medical History  Past Medical History:  Diagnosis Date  . Anxiety    Citalopram controls "panic attacks"  . Asthma   . COPD (chronic obstructive pulmonary disease) ([place])   . Dyspnea    with exertion  . History of kidney stones    x3-4 - lithotripsy  . Pneumonia    08/11/16- 3 years ago  . Pulmonary fibrosis ([place])     Family History   family history includes Colon cancer in his mother; Heart disease in his mother.  Prior Rehab/Hospitalizations Has the patient had major surgery during 100 days prior to admission? Yes, rotator cuff and bicep surgery 10/16   Current Medications Albuterol Celexa Lovenox Pepcid Allegra-D Advair Novolog Lantus Atrovent Xopenex Prednisone Miralax   Patients Current Diet:  Dys.2 textures and thin liquids with PMSV in place and full supervision   Precautions / Restrictions Precautions Precautions: Shoulder, Other (comment) (right bicept and rotator cuff surgery 10/16) Type of Shoulder Precautions: rotator cuff Precautions/Special Needs: Swallowing (Fall) Precaution Comments: trach  Restrictions Weight Bearing Restrictions: No   Has the patient had 2 or more falls or a fall with injury in the past year?No  Prior Activity Level Community (5-7x/wk): Prior to admission patient lived at home with his wife, who is disabled and worked full time as a Designer, television/film set for [employer].  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  He also did yard work, cooked, and cleaned at home.  Patient with recent bicep and rotator cuff surgery 10/16. Then admitted to [facility] ED 10/19 with difficuilty  breathing, since then he has been cared for by acute care and [facility].    Prior Functional Level Self Care: Did the patient need help bathing, dressing, using the toilet or eating? Independent  Indoor Mobility: Did the patient need assistance with walking from room to room (with or without device)? Independent  Stairs: Did the patient need assistance with internal or external stairs (with or without device)? Independent  Functional Cognition: Did the patient need help planning regular tasks such as shopping or remembering to take medications? Independent  Home Assistive Devices / Equipment Home Assistive Devices/Equipment: None Home Equipment: None  Prior Device Use: Indicate devices/aids used by the patient prior to current illness, exacerbation or injury? None of the above   Prior Functional Level Current Functional Level  Bed Mobility   Independent    Max A   Transfers   Independent    Max A   Mobility - Walk/Wheelchair   Independent   Not assessed    Upper Body Dressing   Independent   Total A   Lower Body Dressing   Independent   Total A  Grooming   Independent   Max A  Eating/Drinking   Regular textures and thin liquids, Independent   Max A  Toilet Transfer   Independent   Not assessed   Bladder Continence    Yes, Independent   Incontinent    Bowel Management   Yes, Independent   Incontinent    Stair Climbing   Independent   Not assessed    Communication   Independent   Independent    Memory   Independent   Not Independent, TBA   Cooking/Meal Prep   Independent      Housework   Independent    Money Management   Independent    Driving   Independent      Special needs/care consideration BiPAP/CPAP: No CPM: No Continuous Drip IV: No Dialysis: No         Life Vest: No Oxygen: 28% FiO2, trach collar  Special Bed: No Trach Size: 6 cuffed Shiley  Wound Vac (area): No       Skin: Redness                              Location: Perineum   Bowel mgmt: 09/13/16 intermittent incontinence per chart review   Bladder mgmt: Condom cath recently discontinued, unable to use urinal with right upper extremity restrictions and has incontinence   Diabetic mgmt: No  Previous Home Environment Living Arrangements: Spouse/significant other  Lives With: Spouse, Family, Daughter, Other (Comment) (living at wife's sister's home until their house is ready) Available Help at Discharge: Family, Available 24 hours/day Type of Home: House  Discharge Living Setting Plans for Discharge Living Setting: House, Lives with (comment) (spouse and they are staying with her sister and family ) Type of Home at  Discharge: House Discharge Home Layout: One level Discharge Home Access: Stairs to enter Entrance Stairs-Rails: Can reach both Entrance Stairs-Number of Steps: front: 4 steps Discharge Bathroom Shower/Tub: Tub/shower unit, Curtain Discharge Bathroom Toilet: Standard Discharge Bathroom Accessibility: Yes How Accessible: Accessible via walker Does the patient have any problems obtaining your medications?: No  Social/Family/Support Systems Patient Roles: Spouse, Parent Contact Information: Spouse: Dionicio Stall  Anticipated Caregiver: spouse  Anticipated Caregiver's Contact Information: [phone] Ability/Limitations of Caregiver: spouse is disabled she can provide Supervision-Min A, if more assist is needed other family can assist  Caregiver Availability: 24/7 Discharge Plan Discussed with Primary Caregiver: Yes Is Caregiver In Agreement with Plan?: Yes Does Caregiver/Family have Issues with Lodging/Transportation while Pt is in Rehab?: No  Goals/Additional Needs Patient/Family Goal for Rehab: PT/OT Supervision-Min A; SLP Supervision   Expected length of stay: 14-17 days  Cultural Considerations: [employer]  Dietary Needs: Dys.2 textures and thin liquids  Equipment Needs: TBD Special Service Needs: Please consider consult with Dr. Ophelia Charter for  post-op recommendations   Additional Information: see above Pt/Family Agrees to Admission and willing to participate: Yes Program Orientation Provided & Reviewed with Pt/Caregiver Including Roles  & Responsibilities: Yes Additional Information Needs: see above and patient can be anxious at times per wife's report. Information Needs to be Provided By: PA, MD  Patient Condition: I have reviewed the patient's medical record and met with the patient and spouse at bedside.  Patient is eager to return home and his wife needs him to be more independent prior to this.  Prior to admission patient drove, worked daily, and assisted his wife around the home. Given, that patient is now requiring SpO2 via trach collar, consuming a restricted diet, is incontinent, and requiring Max assist for bed mobility and transfers he makes an excellent IP Rehab candidate.  He would greatly benefit from the coordinated care of the medical, nursing, and therapy team.  He will receive close medical management and will benefit from an acute IP Rehab admission.  I will admit patient today, 09/15/16.  Preadmission Screen Completed By:  Ranelle Oyster, 09/15/2016 10:29 AM ______________________________________________________________________   Discussed status with Dr. Riley Kill on 09/15/16 at 1000 and received telephone approval for admission today.  Admission Coordinator:  Ranelle Oyster, time 1000/Date 09/15/16   Assessment/Plan: Diagnosis: debility/encephalopathy 1. Does the need for close, 24 hr/day  Medical supervision in concert with the patient's rehab needs make it unreasonable for this patient to be served in a less intensive setting? Yes 2. Co-Morbidities requiring supervision/potential complications: anxiety, dysphagia, copd/PF 3. Due to bladder management, bowel management, safety, skin/wound care, disease management, medication administration, pain management and patient education, does the patient require 24 hr/day  rehab nursing? Yes 4. Does the patient require coordinated care of a physician, rehab nurse, PT (1-2 hrs/day, 5 days/week), OT (1-2 hrs/day, 5 days/week) and SLP (1-2 hrs/day, 5 days/week) to address physical and functional deficits in the context of the above medical diagnosis(es)? Yes Addressing deficits in the following areas: balance, endurance, locomotion, strength, transferring, bowel/bladder control, bathing, dressing, feeding, grooming, toileting, cognition, swallowing and psychosocial support 5. Can the patient actively participate in an intensive therapy program of at least 3 hrs of therapy 5 days a week? Yes 6. The potential for patient to make measurable gains while on inpatient rehab is excellent 7. Anticipated functional outcomes upon discharge from inpatients are: supervision and min assist PT, supervision and min assist OT, supervision SLP 8. Estimated rehab length of stay to reach the above functional  goals is: 15-20 days 9. Does the patient have adequate social supports to accommodate these discharge functional goals? Yes 10. Anticipated D/C setting: Home 11. Anticipated post D/C treatments: HH therapy and Outpatient therapy 12. Overall Rehab/Functional Prognosis: excellent    RECOMMENDATIONS: This patient's condition is appropriate for continued rehabilitative care in the following setting: CIR Patient has agreed to participate in recommended program. Yes Note that insurance prior authorization may be required for reimbursement for recommended care.  Comment: Admit to inpatient rehab today  Meredith Staggers, MD, [facility] 09/15/2016   Alger Simons T 09/15/2016

## 2016-09-15 ENCOUNTER — Inpatient Hospital Stay (HOSPITAL_COMMUNITY)
Admission: RE | Admit: 2016-09-15 | Discharge: 2016-09-28 | DRG: 092 | Disposition: A | Discharge status: Home Health | Payer: BLUE CROSS/BLUE SHIELD | Source: Other Acute Inpatient Hospital | Attending: Physical Medicine & Rehabilitation | Admitting: Physical Medicine & Rehabilitation

## 2016-09-15 DIAGNOSIS — J8410 Pulmonary fibrosis, unspecified: Secondary | ICD-10-CM | POA: Diagnosis not present

## 2016-09-15 DIAGNOSIS — R1310 Dysphagia, unspecified: Secondary | ICD-10-CM | POA: Diagnosis present

## 2016-09-15 DIAGNOSIS — G931 Anoxic brain damage, not elsewhere classified: Secondary | ICD-10-CM | POA: Diagnosis not present

## 2016-09-15 DIAGNOSIS — D72829 Elevated white blood cell count, unspecified: Secondary | ICD-10-CM

## 2016-09-15 DIAGNOSIS — J84112 Idiopathic pulmonary fibrosis: Secondary | ICD-10-CM | POA: Diagnosis present

## 2016-09-15 DIAGNOSIS — Z930 Tracheostomy status: Secondary | ICD-10-CM

## 2016-09-15 DIAGNOSIS — J449 Chronic obstructive pulmonary disease, unspecified: Secondary | ICD-10-CM | POA: Diagnosis present

## 2016-09-15 DIAGNOSIS — M75121 Complete rotator cuff tear or rupture of right shoulder, not specified as traumatic: Secondary | ICD-10-CM | POA: Diagnosis present

## 2016-09-15 DIAGNOSIS — F17210 Nicotine dependence, cigarettes, uncomplicated: Secondary | ICD-10-CM | POA: Diagnosis present

## 2016-09-15 DIAGNOSIS — G7281 Critical illness myopathy: Secondary | ICD-10-CM | POA: Diagnosis present

## 2016-09-15 DIAGNOSIS — G629 Polyneuropathy, unspecified: Secondary | ICD-10-CM | POA: Diagnosis present

## 2016-09-15 DIAGNOSIS — F411 Generalized anxiety disorder: Secondary | ICD-10-CM | POA: Diagnosis not present

## 2016-09-15 DIAGNOSIS — R609 Edema, unspecified: Secondary | ICD-10-CM

## 2016-09-15 DIAGNOSIS — T380X5A Adverse effect of glucocorticoids and synthetic analogues, initial encounter: Secondary | ICD-10-CM | POA: Diagnosis present

## 2016-09-15 DIAGNOSIS — R5381 Other malaise: Secondary | ICD-10-CM | POA: Diagnosis not present

## 2016-09-15 DIAGNOSIS — E1165 Type 2 diabetes mellitus with hyperglycemia: Secondary | ICD-10-CM | POA: Diagnosis present

## 2016-09-15 DIAGNOSIS — J9610 Chronic respiratory failure, unspecified whether with hypoxia or hypercapnia: Secondary | ICD-10-CM | POA: Diagnosis present

## 2016-09-15 DIAGNOSIS — E876 Hypokalemia: Secondary | ICD-10-CM

## 2016-09-15 DIAGNOSIS — R600 Localized edema: Secondary | ICD-10-CM

## 2016-09-15 DIAGNOSIS — E119 Type 2 diabetes mellitus without complications: Secondary | ICD-10-CM

## 2016-09-15 HISTORY — DX: Type 2 diabetes mellitus without complications: E11.9

## 2016-09-15 LAB — GLUCOSE, CAPILLARY
Glucose-Capillary: 145 mg/dL — ABNORMAL HIGH (ref 65–99)
Glucose-Capillary: 159 mg/dL — ABNORMAL HIGH (ref 65–99)

## 2016-09-15 MED ORDER — PROCHLORPERAZINE 25 MG RE SUPP: 12.5000 mg | Freq: Four times a day (QID) | RECTAL | Status: DC | PRN

## 2016-09-15 MED ORDER — FLEET ENEMA 7-19 GM/118ML RE ENEM: 1.0000 | ENEMA | Freq: Once | RECTAL | Status: DC | PRN

## 2016-09-15 MED ORDER — PROCHLORPERAZINE MALEATE 5 MG PO TABS
5.0000 mg | ORAL_TABLET | Freq: Four times a day (QID) | ORAL | Status: DC | PRN
  Administered 2016-09-18 – 2016-09-27 (×3): 10 mg via ORAL
  Filled 2016-09-15 (×3): qty 2

## 2016-09-15 MED ORDER — ALUM & MAG HYDROXIDE-SIMETH 200-200-20 MG/5ML PO SUSP
30.0000 mL | ORAL | Status: DC | PRN
  Administered 2016-09-18 – 2016-09-22 (×2): 30 mL via ORAL
  Filled 2016-09-15 (×2): qty 30

## 2016-09-15 MED ORDER — ACETAMINOPHEN 325 MG PO TABS
325.0000 mg | ORAL_TABLET | ORAL | Status: DC | PRN
  Administered 2016-09-16 – 2016-09-25 (×9): 650 mg via ORAL
  Filled 2016-09-15 (×9): qty 2

## 2016-09-15 MED ORDER — INSULIN GLARGINE 100 UNIT/ML ~~LOC~~ SOLN
18.0000 [IU] | Freq: Every day | SUBCUTANEOUS | Status: DC
  Administered 2016-09-15 – 2016-09-19 (×5): 18 [IU] via SUBCUTANEOUS
  Filled 2016-09-15 (×7): qty 0.18

## 2016-09-15 MED ORDER — LEVALBUTEROL HCL 0.63 MG/3ML IN NEBU
0.6300 mg | INHALATION_SOLUTION | Freq: Three times a day (TID) | RESPIRATORY_TRACT | Status: DC
  Administered 2016-09-16 – 2016-09-17 (×6): 0.63 mg via RESPIRATORY_TRACT
  Filled 2016-09-15 (×6): qty 3

## 2016-09-15 MED ORDER — GUAIFENESIN-DM 100-10 MG/5ML PO SYRP
5.0000 mL | ORAL_SOLUTION | Freq: Four times a day (QID) | ORAL | Status: DC | PRN
Start: 2016-09-15 — End: 2016-09-28

## 2016-09-15 MED ORDER — SENNOSIDES-DOCUSATE SODIUM 8.6-50 MG PO TABS: 1.0000 | ORAL_TABLET | Freq: Every evening | ORAL | Status: DC | PRN

## 2016-09-15 MED ORDER — INSULIN ASPART 100 UNIT/ML ~~LOC~~ SOLN
0.0000 [IU] | Freq: Every day | SUBCUTANEOUS | Status: DC
  Administered 2016-09-17: 2 [IU] via SUBCUTANEOUS

## 2016-09-15 MED ORDER — ZOLPIDEM TARTRATE 5 MG PO TABS
5.0000 mg | ORAL_TABLET | Freq: Every evening | ORAL | Status: DC | PRN
  Administered 2016-09-17: 5 mg via ORAL
  Filled 2016-09-15: qty 1

## 2016-09-15 MED ORDER — TRAZODONE HCL 50 MG PO TABS
25.0000 mg | ORAL_TABLET | Freq: Every evening | ORAL | Status: DC | PRN
  Administered 2016-09-15 – 2016-09-21 (×3): 50 mg via ORAL
  Filled 2016-09-15 (×3): qty 1

## 2016-09-15 MED ORDER — OXYCODONE HCL 5 MG PO TABS
5.0000 mg | ORAL_TABLET | ORAL | Status: DC | PRN
  Administered 2016-09-15 – 2016-09-28 (×29): 5 mg via ORAL
  Filled 2016-09-15 (×29): qty 1

## 2016-09-15 MED ORDER — CITALOPRAM HYDROBROMIDE 20 MG PO TABS
20.0000 mg | ORAL_TABLET | Freq: Every day | ORAL | Status: DC
  Administered 2016-09-16 – 2016-09-28 (×13): 20 mg via ORAL
  Filled 2016-09-15 (×7): qty 1
  Filled 2016-09-15: qty 2
  Filled 2016-09-15 (×5): qty 1

## 2016-09-15 MED ORDER — GABAPENTIN 100 MG PO CAPS
100.0000 mg | ORAL_CAPSULE | Freq: Every day | ORAL | Status: DC
  Administered 2016-09-15 – 2016-09-27 (×13): 100 mg via ORAL
  Filled 2016-09-15 (×13): qty 1

## 2016-09-15 MED ORDER — DIPHENHYDRAMINE HCL 12.5 MG/5ML PO ELIX: 12.5000 mg | ORAL_SOLUTION | Freq: Four times a day (QID) | ORAL | Status: DC | PRN

## 2016-09-15 MED ORDER — PROCHLORPERAZINE EDISYLATE 5 MG/ML IJ SOLN: 5.0000 mg | Freq: Four times a day (QID) | INTRAMUSCULAR | Status: DC | PRN

## 2016-09-15 MED ORDER — IPRATROPIUM BROMIDE 0.02 % IN SOLN
0.5000 mg | Freq: Three times a day (TID) | RESPIRATORY_TRACT | Status: DC
  Administered 2016-09-16 – 2016-09-17 (×6): 0.5 mg via RESPIRATORY_TRACT
  Filled 2016-09-15 (×6): qty 2.5

## 2016-09-15 MED ORDER — ENOXAPARIN SODIUM 40 MG/0.4ML ~~LOC~~ SOLN
40.0000 mg | SUBCUTANEOUS | Status: DC
  Administered 2016-09-16 – 2016-09-27 (×12): 40 mg via SUBCUTANEOUS
  Filled 2016-09-15 (×12): qty 0.4

## 2016-09-15 MED ORDER — LABETALOL HCL 100 MG PO TABS
100.0000 mg | ORAL_TABLET | Freq: Three times a day (TID) | ORAL | Status: DC
  Administered 2016-09-15 – 2016-09-28 (×30): 100 mg via ORAL
  Filled 2016-09-15 (×34): qty 1

## 2016-09-15 MED ORDER — TRAMADOL HCL 50 MG PO TABS
50.0000 mg | ORAL_TABLET | Freq: Four times a day (QID) | ORAL | Status: DC | PRN
  Administered 2016-09-17 – 2016-09-26 (×10): 50 mg via ORAL
  Filled 2016-09-15 (×10): qty 1

## 2016-09-15 MED ORDER — IPRATROPIUM BROMIDE 0.02 % IN SOLN
0.5000 mg | Freq: Three times a day (TID) | RESPIRATORY_TRACT | Status: DC
  Administered 2016-09-15 (×2): 0.5 mg via RESPIRATORY_TRACT
  Filled 2016-09-15 (×2): qty 2.5

## 2016-09-15 MED ORDER — METHOCARBAMOL 500 MG PO TABS
500.0000 mg | ORAL_TABLET | Freq: Four times a day (QID) | ORAL | Status: DC | PRN
  Administered 2016-09-15 – 2016-09-28 (×15): 500 mg via ORAL
  Filled 2016-09-15 (×16): qty 1

## 2016-09-15 MED ORDER — ALPRAZOLAM 0.25 MG PO TABS
0.2500 mg | ORAL_TABLET | Freq: Three times a day (TID) | ORAL | Status: DC | PRN
  Administered 2016-09-15 – 2016-09-24 (×7): 0.25 mg via ORAL
  Filled 2016-09-15 (×7): qty 1

## 2016-09-15 MED ORDER — PREDNISONE 20 MG PO TABS
20.0000 mg | ORAL_TABLET | Freq: Every day | ORAL | Status: DC
  Administered 2016-09-16 – 2016-09-28 (×13): 20 mg via ORAL
  Filled 2016-09-15 (×13): qty 1

## 2016-09-15 MED ORDER — INSULIN ASPART 100 UNIT/ML ~~LOC~~ SOLN
0.0000 [IU] | Freq: Three times a day (TID) | SUBCUTANEOUS | Status: DC
  Administered 2016-09-15: 1 [IU] via SUBCUTANEOUS
  Administered 2016-09-16 (×2): 2 [IU] via SUBCUTANEOUS
  Administered 2016-09-16: 1 [IU] via SUBCUTANEOUS
  Administered 2016-09-17: 3 [IU] via SUBCUTANEOUS
  Administered 2016-09-17: 2 [IU] via SUBCUTANEOUS
  Administered 2016-09-18: 1 [IU] via SUBCUTANEOUS
  Administered 2016-09-18 – 2016-09-19 (×3): 2 [IU] via SUBCUTANEOUS
  Administered 2016-09-19: 1 [IU] via SUBCUTANEOUS
  Administered 2016-09-20 (×3): 2 [IU] via SUBCUTANEOUS
  Administered 2016-09-21: 5 [IU] via SUBCUTANEOUS
  Administered 2016-09-21 – 2016-09-22 (×2): 2 [IU] via SUBCUTANEOUS
  Administered 2016-09-23: 1 [IU] via SUBCUTANEOUS
  Administered 2016-09-23: 3 [IU] via SUBCUTANEOUS
  Administered 2016-09-24 (×2): 2 [IU] via SUBCUTANEOUS
  Administered 2016-09-25: 1 [IU] via SUBCUTANEOUS
  Administered 2016-09-25 – 2016-09-26 (×2): 2 [IU] via SUBCUTANEOUS
  Administered 2016-09-27: 3 [IU] via SUBCUTANEOUS
  Administered 2016-09-27: 1 [IU] via SUBCUTANEOUS

## 2016-09-15 MED ORDER — BISACODYL 10 MG RE SUPP: 10.0000 mg | Freq: Every day | RECTAL | Status: DC | PRN

## 2016-09-15 MED ORDER — ENSURE ENLIVE PO LIQD
237.0000 mL | Freq: Two times a day (BID) | ORAL | Status: DC
  Administered 2016-09-16 – 2016-09-27 (×18): 237 mL via ORAL

## 2016-09-15 MED ORDER — LEVALBUTEROL HCL 0.63 MG/3ML IN NEBU
0.6300 mg | INHALATION_SOLUTION | Freq: Three times a day (TID) | RESPIRATORY_TRACT | Status: DC
  Administered 2016-09-15 (×2): 0.63 mg via RESPIRATORY_TRACT
  Filled 2016-09-15 (×2): qty 3

## 2016-09-15 MED ORDER — FAMOTIDINE 10 MG PO TABS
10.0000 mg | ORAL_TABLET | Freq: Two times a day (BID) | ORAL | Status: DC
  Administered 2016-09-15 – 2016-09-28 (×26): 10 mg via ORAL
  Filled 2016-09-15 (×26): qty 1

## 2016-09-15 NOTE — Progress Notes (Signed)
Bradley [name] PHYSICAL MEDICINE & REHABILITATION     PROGRESS NOTE    Subjective/Complaints: Had a very good night. Excited to start therapies!  ROS: Pt denies fever, rash/itching, headache, blurred or double vision, nausea, vomiting, abdominal pain, diarrhea, chest pain, shortness of breath, palpitations, dysuria, dizziness, neck pain, back pain, bleeding, anxiety, or depression   Objective: Vital Signs: Blood pressure 99/76, pulse 78, temperature 98.3 F (36.8 C), temperature source Oral, resp. rate (!) 24, height 5\' 9"  (1.753 m), weight 74.8 kg (164 lb 12.8 oz), SpO2 98 %. No results found. No results for input(s): WBC, HGB, HCT, PLT in the last 72 hours. No results for input(s): NA, K, CL, GLUCOSE, BUN, CREATININE, CALCIUM in the last 72 hours.  Invalid input(s): CO CBG (last 3)   Recent Labs  09/15/16 1823  GLUCAP 145*    Wt Readings from Last 3 Encounters:  09/15/16 74.8 kg (164 lb 12.8 oz)  09/14/16 79.5 kg (175 lb 3.2 oz)  08/14/16 90.7 kg (200 lb)    Physical Exam:  Constitutional: no distress   .  HENT: oral mucosa pink Head: [place].  Mouth/Throat: Oropharynx is clear and moist.  Eyes: Conjunctivaeare normal. Pupils are equal, round, and reactive to light. Right eye exhibits no discharge.  Neck: Normal range of motion.  Cuffed # 6 trach in place with PMSV--good vocal quality. Cardiovascular: Normal rateand regular rhythm.  Respiratory: no distress. No wheezes or rhonchi. GI: BS+ ntnd Musculoskeletal: He exhibits no edemaor tenderness.  Right shoulder in sling Neurological: He is alertand oriented to person, place, and time. No cranial nerve deficit.  STM deficits. LUE with dysesthesias?   RUE limited by sling--4/5 HI. LUE: 3/5 deltoid, bicep, tricep and 4/5 wrist and HI. LE: 3/ HF, KE and 4/5 ADF/PF.---no change today.  No sensory deficits.  Skin: Skin is warmand dry. He is not diaphoretic.  Psychiatric: pleasant. less  anxious   Assessment/Plan: 1. Functional deficits secondary to debility and anoxic encephalopathy which require 3+ hours per day of interdisciplinary therapy in a comprehensive inpatient rehab setting. Physiatrist is providing close team supervision and 24 hour management of active medical problems listed below. Physiatrist and rehab team continue to assess barriers to discharge/monitor patient progress toward functional and medical goals.  Function:  Bathing Bathing position      Bathing parts      Bathing assist        Upper Body Dressing/Undressing Upper body dressing                    Upper body assist        Lower Body Dressing/Undressing Lower body dressing                                  Lower body assist        Toileting Toileting          Toileting assist     Transfers Chair/bed transfer             Locomotion Ambulation           Wheelchair          Cognition Comprehension    Expression    Social Interaction    Problem Solving    Memory     Medical Problem List and Plan: 1. Functional, mobility and cognitive deficits secondary to debility/?anoxic encephalopathy -begin therapies today 2. DVT Prophylaxis/Anticoagulation: Pharmaceutical: Lovenox 3.  Pain Management: Added low dose Neurontin to help with neuropathy BUE.   -used ibuprofen daily for myalgias at home.  4. Mood: Team to provide ego support. LCSW to follow for evaluation and support.  5. Neuropsych: This patient is not fully capable of making decisions on hisown behalf. 6. Skin/Wound Care: routine trach care. Continue airmatress overlay for pressure relief measures.  7. Fluids/Electrolytes/Nutrition: push po  I personally reviewed the patient's labs today.    -replete K+ 8. VDRF: Cuffed #6 trach in place. No secretions noted and tolerating PMSV with 40% ATC very well.  -?downsize next week  9. COPD with IPF: On prednisone  20 mg daily with  10. Anxiety:   -now on celexa  -added xanax prn for breakthrough symptoms.     11. Hyperglycemia: Likely due to illness and steroids. Will check Hgb A1c.  -Continue Lantus for now---probably can begin wean  -. Will monitor BS ac/hs and use SSI for better BS control.   LOS (Days) 0 A FACE TO FACE EVALUATION WAS PERFORMED  Bradley [name] 09/15/2016 6:48 PM

## 2016-09-15 NOTE — Progress Notes (Signed)
Meredith Staggers, MD Physician Signed Physical Medicine and Rehabilitation  PMR Pre-admission Date of Service: 09/14/2016 4:02 PM  Related encounter: Admission (Current) from 09/09/2016 in [facility]       _0 [facility] copied text   Secondary [employer] PMR Admission Coordinator Pre-Admission Assessment  Patient: CAYLOR TALLARICO is an 55 y.o., male MRN: [id number] DOB: April 17, 1961 Height: _1  (175.3 cm) Weight: 79.5 kg (175 lb 3.2 oz)  Insurance Information HMO:     PPO: X     PCP:      IPA:      80/20:      OTHER:  PRIMARY: BCBS [place]      Policy#: [id number]      Subscriber: Self CM Name: Lauraine Rinne      Phone#: [phone]     Fax#: [phone] Pre-Cert#: [id number]      Employer: [employer] Benefits:  Phone #: [phone]     Name: Haynes Kerns. Date: 10/31/15     Deduct: $750      Out of Pocket Max: $4,000      Life Max: none CIR: 20% co-insurance after deductible       SNF:  20% co-insurance after deductible, 60 day limit Outpatient:  30 visit limits      Co-Pay:  20% co-insurance after deductible Home Health: medical necessity with prior authorization       Co-Pay:  20% co-insurance after deductible DME: 80%     Co-Pay:  20% co-insurance after deductible Providers: in-network   Medicaid Application Date:       Case Manager:  Disability Application Date:       Case Worker:   Emergency Tax adviser Information    Name Relation Home Work [place] Spouse [phone]  [phone]      Current Medical History  Patient Admitting Diagnosis: Deconditioning with muscle weakness and trach after acute respiratory failure  History of Present Illness: Tysheem Accardo is a 55 year old male with h/o of COPD--ongoing tobacco use, interstitial pulmonary fibrosis, DOE, anxiety disorder, right shoulder arthroscopy 08/14/16 by Dr. Lorin Mercy who developed progressive SOB with productive cough and was admitted on 08/16/16 with acute  hypoxic/hypercarbic respiratory failure due to IPF v/sp post ALI v/s HCAP. He was treated with broad spectrum antibiotics and steroids but progressed to VDRF requiring tracheostomy 11/6. He continued to require vent support for rest. 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 He was transferred to [facility] for vent wean and rehab on 09/08/16.  Patient will admit to IP Rehab 09/15/16.   Patient's medical record from [facility] has been reviewed by the rehabilitation admission coordinator and physician.  Past Medical History      Past Medical History:  Diagnosis Date  . Anxiety    Citalopram controls "panic attacks"  . Asthma   . COPD (chronic obstructive pulmonary disease) ([place])   . Dyspnea    with exertion  . History of kidney stones    x3-4 - lithotripsy  . Pneumonia    08/11/16- 3 years ago  . Pulmonary fibrosis ([place])     Family History   family history includes Colon cancer in his mother; Heart disease in his mother.  Prior Rehab/Hospitalizations Has the patient had major surgery during 100 days prior to admission? Yes, rotator cuff and bicep surgery 10/16          Current Medications Albuterol Celexa Lovenox Pepcid  Allegra-D Advair Novolog Lantus Atrovent Xopenex Prednisone Miralax   Patients Current Diet:  Dys.2 textures and thin liquids with PMSV in place and full supervision   Precautions / Restrictions Precautions Precautions: Shoulder, Other (comment) (right bicept and rotator cuff surgery 10/16) Type of Shoulder Precautions: rotator cuff Precautions/Special Needs: Swallowing (Fall) Precaution Comments: trach  Restrictions Weight Bearing Restrictions: No   Has the patient had 2 or more falls or a fall with injury in the past year?No  Prior Activity Level Community (5-7x/wk): Prior to admission patient lived at home with his wife, who is disabled and worked  full time as a Designer, television/film set for [employer].  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  He also did yard work, cooked, and cleaned at home.  Patient with recent bicep and rotator cuff surgery 10/16. Then admitted [facility] 10/19 with difficuilty breathing, since then he has been cared for by acute care and [facility].    Prior Functional Level Self Care: Did the patient need help bathing, dressing, using the toilet or eating? Independent  Indoor Mobility: Did the patient need assistance with walking from room to room (with or without device)? Independent  Stairs: Did the patient need assistance with internal or external stairs (with or without device)? Independent  Functional Cognition: Did the patient need help planning regular tasks such as shopping or remembering to take medications? Independent  Home Assistive Devices / Equipment Home Assistive Devices/Equipment: None Home Equipment: None  Prior Device Use: Indicate devices/aids used by the patient prior to current illness, exacerbation or injury? None of the above   Prior Functional Level Current Functional Level  Bed Mobility  Independent   Max A  Transfers  Independent   Max A  Mobility - Walk/Wheelchair  Independent   Not assessed   Upper Body Dressing  Independent   Total A  Lower Body Dressing  Independent   Total A  Grooming  Independent   Max A  Eating/Drinking  Regular textures and thin liquids, Independent   Max A  Toilet Transfer  Independent   Not assessed   Bladder Continence   Yes, Independent   Incontinent   Bowel Management  Yes, Independent   Incontinent   Stair Climbing  Independent   Not assessed   Communication  Independent   Independent   Memory  Independent   Not Independent, TBA  Cooking/Meal Prep  Independent      Housework  Independent    Money Management  Independent    Driving  Independent      Special needs/care  consideration BiPAP/CPAP: No CPM: No Continuous Drip IV: No Dialysis: No         Life Vest: No Oxygen: 28% FiO2, trach collar  Special Bed: No Trach Size: 6 cuffed Shiley  Wound Vac (area): No       Skin: Redness                              Location: Perineum  Bowel mgmt: 09/13/16 intermittent incontinence per chart review     Bladder mgmt: Condom cath recently discontinued, unable to use urinal with right upper extremity restrictions and has incontinence   Diabetic mgmt: No  Previous Home Environment Living Arrangements: Spouse/significant other  Lives With: Spouse, Family, Daughter, Other (Comment) (living at wife's sister's home until their house is ready) Available Help at Discharge: Family, Available 24 hours/day Type  of Home: House  Discharge Living Setting Plans for Discharge Living Setting: House, Lives with (comment) (spouse and they are staying with her sister and family ) Type of Home at Discharge: House Discharge Home Layout: One level Discharge Home Access: Stairs to enter Entrance Stairs-Rails: Can reach both Entrance Stairs-Number of Steps: front: 4 steps Discharge Bathroom Shower/Tub: Tub/shower unit, Curtain Discharge Bathroom Toilet: Standard Discharge Bathroom Accessibility: Yes How Accessible: Accessible via walker Does the patient have any problems obtaining your medications?: No  Social/Family/Support Systems Patient Roles: Spouse, Parent Contact Information: Spouse: Chancy Hurter  Anticipated Caregiver: spouse  Anticipated Caregiver's Contact Information: [phone] Ability/Limitations of Caregiver: spouse is disabled she can provide Supervision-Min A, if more assist is needed other family can assist  Caregiver Availability: 24/7 Discharge Plan Discussed with Primary Caregiver: Yes Is Caregiver In Agreement with Plan?: Yes Does Caregiver/Family have Issues with Lodging/Transportation while Pt is in Rehab?: No  Goals/Additional  Needs Patient/Family Goal for Rehab: PT/OT Supervision-Min A; SLP Supervision   Expected length of stay: 14-17 days  Cultural Considerations: [employer]  Dietary Needs: Dys.2 textures and thin liquids  Equipment Needs: TBD Special Service Needs: Please consider consult with Dr. Lorin Mercy for post-op recommendations   Additional Information: see above Pt/Family Agrees to Admission and willing to participate: Yes Program Orientation Provided & Reviewed with Pt/Caregiver Including Roles  & Responsibilities: Yes Additional Information Needs: see above and patient can be anxious at times per wife's report. Information Needs to be Provided By: PA, MD  Patient Condition: I have reviewed the patient's medical record and met with the patient and spouse at bedside.  Patient is eager to return home and his wife needs him to be more independent prior to this.  Prior to admission patient drove, worked daily, and assisted his wife around the home. Given, that patient is now requiring SpO2 via trach collar, consuming a restricted diet, is incontinent, and requiring Max assist for bed mobility and transfers he makes an excellent IP Rehab candidate.  He would greatly benefit from the coordinated care of the medical, nursing, and therapy team.  He will receive close medical management and will benefit from an acute IP Rehab admission.  I will admit patient today, 09/15/16.  Preadmission Screen Completed By:  Meredith Staggers, 09/15/2016 10:29 AM ______________________________________________________________________   Discussed status with Dr. Naaman Plummer on 09/15/16 at 49 and received telephone approval for admission today.  Admission Coordinator:  Meredith Staggers, time 1000/Date 09/15/16   Assessment/Plan: Diagnosis: debility/encephalopathy 1. Does the need for close, 24 hr/day  Medical supervision in concert with the patient's rehab needs make it unreasonable for this patient to be served in a less intensive  setting? Yes 2. Co-Morbidities requiring supervision/potential complications: anxiety, dysphagia, copd/PF 3. Due to bladder management, bowel management, safety, skin/wound care, disease management, medication administration, pain management and patient education, does the patient require 24 hr/day rehab nursing? Yes 4. Does the patient require coordinated care of a physician, rehab nurse, PT (1-2 hrs/day, 5 days/week), OT (1-2 hrs/day, 5 days/week) and SLP (1-2 hrs/day, 5 days/week) to address physical and functional deficits in the context of the above medical diagnosis(es)? Yes Addressing deficits in the following areas: balance, endurance, locomotion, strength, transferring, bowel/bladder control, bathing, dressing, feeding, grooming, toileting, cognition, swallowing and psychosocial support 5. Can the patient actively participate in an intensive therapy program of at least 3 hrs of therapy 5 days a week? Yes 6. The potential for patient to make measurable gains while on inpatient rehab is excellent 7.  Anticipated functional outcomes upon discharge from inpatients are: supervision and min assist PT, supervision and min assist OT, supervision SLP 8. Estimated rehab length of stay to reach the above functional goals is: 15-20 days 9. Does the patient have adequate social supports to accommodate these discharge functional goals? Yes 10. Anticipated D/C setting: Home 11. Anticipated post D/C treatments: HH therapy and Outpatient therapy 12. Overall Rehab/Functional Prognosis: excellent    RECOMMENDATIONS: This patient's condition is appropriate for continued rehabilitative care in the following setting: CIR Patient has agreed to participate in recommended program. Yes Note that insurance prior authorization may be required for reimbursement for recommended care.  Comment: Admit to inpatient rehab today  Meredith Staggers, MD, [place] [facility] &  Rehabilitation 09/15/2016   Alger Simons T 09/15/2016    Revision History

## 2016-09-15 NOTE — H&P (Signed)
Physical Medicine and Rehabilitation Admission H&P    CC: Critical illness myopathy.    HPI:  Bradley [name] is a 55 year old male with h/o of COPD--ongoing tobacco use, interstitial pulmonary fibrosis,  DOE, anxiety disorder, right shoulder arthroscopy 08/14/16 by Dr. Ophelia CharterYates who developed progressive SOB with productive cough and was admitted on 08/16/16 with acute hypoxic/hypercarbic respiratory failure due to IPF v/sp post ALI v/s HCAP. He was treated with broad spectrum antibiotics and steroids but progressed to VDRF requiring tracheostomy 11/6. He continued to require vent support for rest. 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 [facility] and rehab on 09/08/16.    He has been weaned off the vent and tolerating PMSV during the day.  PCCM recommends continue valve during thd day and capping trials once a little stronger. He is tolerating dysphagia II, thin liquids. Mentation improved but he continues to have high levels of anxiety which is worse at nights. He is tolerating increase in activity and was able to sit in chair.     Review of Systems  Constitutional: Positive for malaise/fatigue.  HENT: Negative for ear pain, hearing loss and tinnitus.   Eyes: Positive for blurred vision (due to lack of prescription glasses).  Respiratory: Positive for cough and shortness of breath.   Cardiovascular: Negative for chest pain and palpitations.  Gastrointestinal: Negative for abdominal pain, heartburn and nausea.       Bowel and bladder urgency.   Genitourinary: Positive for urgency. Negative for dysuria.  Musculoskeletal: Positive for myalgias (hurts all over).  Skin: Negative for itching and rash.  Neurological: Positive for dizziness, sensory change (Numbness/tingling BUE--worse at nights. ), focal weakness (Dense LUE weakness new. ), weakness and headaches.  Psychiatric/Behavioral:  Positive for depression. The patient is nervous/anxious (wife feels exacerbated by nicotie withdrawal. ) and has insomnia.           Past Medical History:  Diagnosis Date  . Anxiety    Citalopram controls "panic attacks"  . Asthma   . COPD (chronic obstructive pulmonary disease) (HCC)   . Dyspnea    with exertion  . History of kidney stones    x3-4 - lithotripsy  . Pneumonia    08/11/16- 3 years ago  . Pulmonary fibrosis (HCC)          Past Surgical History:  Procedure Laterality Date  . COLONOSCOPY W/ POLYPECTOMY    . LITHOTRIPSY    . MULTIPLE TOOTH EXTRACTIONS     19 teeth extracted at once  . SHOULDER ARTHROSCOPY WITH ROTATOR CUFF REPAIR Right 08/14/2016   Procedure: RIGHT SHOULDER ARTHROSCOPY,DEBRIDEMENT, OPEN ROTATOR CUFF REPAIR;  Surgeon: Eldred MangesMark C Yates, MD;  Location: [facility] OR;  Service: Orthopedics;  Laterality: Right;  . TONSILLECTOMY           Family History  Problem Relation Age of Onset  . Heart disease Mother   . Colon cancer Mother     Social History:  Married. 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 He reports that he has been smoking Cigarettes--2 PPD.  He has a 30.00 pack-year smoking history. He has never used smokeless tobacco. He reports that he does not drink alcohol or use drugs.         Allergies  Allergen Reactions  . Wellbutrin [Bupropion] Cough          Medications Prior to Admission  Medication Sig Dispense Refill  . albuterol (PROVENTIL HFA;VENTOLIN HFA) 108 (90 BASE) MCG/ACT inhaler Inhale 1 puff into the lungs every 6 (six) hours as needed for wheezing or shortness of breath.    . citalopram (CELEXA) 20 MG tablet Take 30 mg by mouth every morning.     . enoxaparin (LOVENOX) 40 MG/0.4ML injection Inject 0.4 mLs (40 mg total) into the skin daily. 0 Syringe   . famotidine (PEPCID) 20-0.9 MG/50ML-% Inject 50 mLs (20 mg total) into the vein every 12  (twelve) hours. 50 mL   . fexofenadine-pseudoephedrine (ALLEGRA-D) 60-120 MG 12 hr tablet Take 1 tablet by mouth every morning.     . Fluticasone-Salmeterol (ADVAIR) 250-50 MCG/DOSE AEPB Inhale 1 puff into the lungs 2 (two) times daily.    Marland Kitchen ibuprofen (ADVIL,MOTRIN) 200 MG tablet Take 600 mg by mouth every 6 (six) hours as needed for mild pain.    Marland Kitchen insulin aspart (NOVOLOG) 100 UNIT/ML injection Inject 0-15 Units into the skin every 4 (four) hours. 10 mL 11  . insulin aspart (NOVOLOG) 100 UNIT/ML injection Inject 3 Units into the skin every 4 (four) hours. 10 mL 11  . insulin glargine (LANTUS) 100 UNIT/ML injection Inject 0.18 mLs (18 Units total) into the skin at bedtime. 10 mL 11  . ipratropium (ATROVENT) 0.02 % nebulizer solution Take 2.5 mLs (0.5 mg total) by nebulization 3 (three) times daily. 75 mL 12  . levalbuterol (XOPENEX) 1.25 MG/0.5ML nebulizer solution Take 1.25 mg by nebulization 3 (three) times daily. 1 each 12  . mouth rinse LIQD solution 15 mLs by Mouth Rinse route QID.  0  . Nutritional Supplements (FEEDING SUPPLEMENT, VITAL AF 1.2 CAL,) LIQD Place 1,000 mLs into feeding tube continuous.    . polyethylene glycol (MIRALAX / GLYCOLAX) packet Take 17 g by mouth daily as needed for moderate constipation. 14 each 0  . predniSONE (DELTASONE) 5 MG tablet 1 tablet (5 mg total) by Per NG tube route daily with breakfast.      Home: Home Living Living Arrangements: Spouse/significant other Available Help at Discharge: Family, Available 24 hours/day Type of Home: House Home Equipment: None  Lives With: Spouse, Family, Daughter, Other (Comment) (living at wife's sister's home until their house is ready)   Functional History: Prior Function Level of Independence: Independent  Functional Status:  Mobility:          ADL:    Cognition: Cognition Overall Cognitive Status: Impaired/Different from baseline Orientation Level: Oriented to person, Oriented to  place, Disoriented to time, Disoriented to situation Cognition Overall Cognitive Status: Impaired/Different from baseline   Height 5\' 9"  (1.753 m), weight 79.5 kg (175 lb 3.2 oz). Physical Exam  Nursing note and vitals reviewed. Constitutional: He is oriented to person, place, and time. He appears well-developed and well-nourished. He has a sickly appearance. No distress.  Anxious thin male with PMSV in place.  Restless and easily frustrated. Desaturated to low 80's attempts at fluid intake.   HENT:  Head: Normocephalic and atraumatic.  Mouth/Throat: Oropharynx is clear and moist.  Eyes: Conjunctivae are normal. Pupils are equal, round, and reactive to light. Right eye exhibits no discharge.  Neck: Normal range of motion.  Cuffed # 6 trach in place with dry gauze. Tolerating PMSV without stridor.  Cardiovascular: Normal rate and regular rhythm.   Respiratory: Breath sounds normal. No stridor. No respiratory distress. He exhibits no tenderness.  Noted to have pursed lip breathing at times. Scattered rhonchi  GI: Soft. Bowel sounds are normal.  He exhibits no distension. There is no tenderness.  Musculoskeletal: He exhibits no edema or tenderness.  Right shoulder in sling. Still tender with subtle movements of RUE.  Neurological: He is alert and oriented to person, place, and time. No cranial nerve deficit.  Able to answer basic questions with encouragement/ego support. Was unable to recall month without cues to use calender. Could tell me the year 2017.  Reported age as 75 years. He is able to follow basic motor commands. Reasonable insight.  LUE with dysesthesias? Diffuse weakness LUE>BLE. RUE limited by sling--4/5 HI. LUE:  3/5 deltoid, bicep, tricep and 4/5 wrist and HI. LE: 3/ HF, KE and 4/5 ADF/PF. No sensory deficits.   Skin: Skin is warm and dry. He is not diaphoretic.  Psychiatric: His mood appears anxious. His speech is rapid and/or pressured. He is withdrawn. Cognition and memory  are impaired. He expresses impulsivity.  Slightly anxious    Lab Results Last 48 Hours  No results found for this or any previous visit (from the past 48 hour(s)).   Imaging Results (Last 48 hours)  No results found.       Medical Problem List and Plan: 1.  Functional, mobility and cognitive deficits  secondary to debility/?anoxic encephalopathy             -admit to inpatient rehab 2.  DVT Prophylaxis/Anticoagulation: Pharmaceutical: Lovenox 3. Pain Management: Add low dose Neurontin to help with neuropathy BUE. Was using ibuprofen daily for myalgias at home.  4. Mood: Team to provide ego support. LCSW to follow for evaluation and support.  5. Neuropsych: This patient is not fully capable of making decisions on his own behalf. 6. Skin/Wound Care: routine trach care. Continue airmatress overlay for pressure relief measures.  7. Fluids/Electrolytes/Nutrition: Monitor I/O. Check lytes in am. Offer supplements during the day.  8. VDRF: Cuffed #6 trach in place. No secretions noted and tolerating PMSV with 40% ATC.              -?downsize and decannulate while here on inpatient rehab depending upon clinical picture   9. COPD with IPF: On prednisone 20 mg daily with  10. Anxiety: Will change Zoloft back to Celexa as this was managing anxiety at home.  Will add low dose xanax prn for use. May be exacerbated by modafinil ( will d/c) and steroids.  11. Hyperglycemia: Likely due to illness and steroids. Will check Hgb A1c. Continue Lantus for now. Will monitor BS ac/hs and use SSI for better BS control.    Post Admission Physician Evaluation: 1. Functional deficits secondary  to Debility and anoxic encephalopathy. 2. Patient is admitted to receive collaborative, interdisciplinary care between the physiatrist, rehab nursing staff, and therapy team. 3. Patient's level of medical complexity and substantial therapy needs in context of that medical necessity cannot be provided at a lesser  intensity of care such as a SNF. 4. Patient has experienced substantial functional loss from his/her baseline which was documented above under the "Functional History" and "Functional Status" headings.  Judging by the patient's diagnosis, physical exam, and functional history, the patient has potential for functional progress which will result in measurable gains while on inpatient rehab.  These gains will be of substantial and practical use upon discharge  in facilitating mobility and self-care at the household level. 5. Physiatrist will provide 24 hour management of medical needs as well as oversight of the therapy plan/treatment and provide guidance as appropriate regarding the interaction of the two. 6. The Preadmission  Screening has been reviewed and patient status is unchanged unless otherwise stated above. 7. 24 hour rehab nursing will assist with bladder management, bowel management, safety, skin/wound care, disease management, medication administration, pain management and patient education  and help integrate therapy concepts, techniques,education, etc. 8. PT will assess and treat for/with: Lower extremity strength, range of motion, stamina, balance, functional mobility, safety, adaptive techniques and equipment, NMR, activity tolerance ,family education.   Goals are: supervision to min assist. 9. OT will assess and treat for/with: ADL's, functional mobility, safety, upper extremity strength, adaptive techniques and equipment, NMR, activity tolerance, community reintegration, family ed.   Goals are: supervision to min assist. Therapy may proceed with showering this patient. 10. SLP will assess and treat for/with: cognition, communication, education.  Goals are: supervision. 11. Case Management and Social Worker will assess and treat for psychological issues and discharge planning. 12. Team conference will be held weekly to assess progress toward goals and to determine barriers to  discharge. 13. Patient will receive at least 3 hours of therapy per day at least 5 days per week. 14. ELOS: 15-20 days       15. Prognosis:  excellent     Ranelle OysterZachary T. Maven Varelas, MD, [facility] [place] [facility] 09/15/2016

## 2016-09-15 NOTE — H&P (Signed)
Physical Medicine and Rehabilitation Admission H&P    CC: Critical illness myopathy.    HPI:  Bradley [name] is a 55 year old male with h/o of COPD--ongoing tobacco use, interstitial pulmonary fibrosis,  DOE, anxiety disorder, right shoulder arthroscopy 08/14/16 by Dr. Ophelia Charter who developed progressive SOB with productive cough and was admitted on 08/16/16 with acute hypoxic/hypercarbic respiratory failure due to IPF v/sp post ALI v/s HCAP. He was treated with broad spectrum antibiotics and steroids but progressed to VDRF requiring tracheostomy 11/6. He continued to require vent support for rest. 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 He was transferred to [facility] for vent wean and rehab on 09/08/16.    He has been weaned off the vent and tolerating PMSV during the day.  PCCM recommends continue valve during thd day and capping trials once a little stronger. He is tolerating dysphagia II, thin liquids. Mentation improved but he continues to have high levels of anxiety which is worse at nights. He is tolerating increase in activity and was able to sit in chair.     Review of Systems  Constitutional: Positive for malaise/fatigue.  HENT: Negative for ear pain, hearing loss and tinnitus.   Eyes: Positive for blurred vision (due to lack of prescription glasses).  Respiratory: Positive for cough and shortness of breath.   Cardiovascular: Negative for chest pain and palpitations.  Gastrointestinal: Negative for abdominal pain, heartburn and nausea.       Bowel and bladder urgency.   Genitourinary: Positive for urgency. Negative for dysuria.  Musculoskeletal: Positive for myalgias (hurts all over).  Skin: Negative for itching and rash.  Neurological: Positive for dizziness, sensory change (Numbness/tingling BUE--worse at nights. ), focal weakness (Dense LUE weakness new. ), weakness and headaches.  Psychiatric/Behavioral: Positive for  depression. The patient is nervous/anxious (wife feels exacerbated by nicotie withdrawal. ) and has insomnia.       Past Medical History:  Diagnosis Date  . Anxiety    Citalopram controls "panic attacks"  . Asthma   . COPD (chronic obstructive pulmonary disease) (HCC)   . Dyspnea    with exertion  . History of kidney stones    x3-4 - lithotripsy  . Pneumonia    08/11/16- 3 years ago  . Pulmonary fibrosis (HCC)     Past Surgical History:  Procedure Laterality Date  . COLONOSCOPY W/ POLYPECTOMY    . LITHOTRIPSY    . MULTIPLE TOOTH EXTRACTIONS     19 teeth extracted at once  . SHOULDER ARTHROSCOPY WITH ROTATOR CUFF REPAIR Right 08/14/2016   Procedure: RIGHT SHOULDER ARTHROSCOPY,DEBRIDEMENT, OPEN ROTATOR CUFF REPAIR;  Surgeon: Eldred Manges, MD;  Location: [facility] OR;  Service: Orthopedics;  Laterality: Right;  . TONSILLECTOMY      Family History  Problem Relation Age of Onset  . Heart disease Mother   . Colon cancer Mother     Social History:  Married. 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 He reports that he has been smoking Cigarettes--2 PPD.  He has a 30.00 pack-year smoking history. He has never used smokeless tobacco. He reports that he does not drink alcohol or use drugs.     Allergies  Allergen Reactions  . Wellbutrin [Bupropion] Cough    Medications Prior to Admission  Medication Sig Dispense Refill  . albuterol (PROVENTIL HFA;VENTOLIN HFA) 108 (90 BASE) MCG/ACT inhaler Inhale 1 puff into the lungs every 6 (six)  hours as needed for wheezing or shortness of breath.    . citalopram (CELEXA) 20 MG tablet Take 30 mg by mouth every morning.     . enoxaparin (LOVENOX) 40 MG/0.4ML injection Inject 0.4 mLs (40 mg total) into the skin daily. 0 Syringe   . famotidine (PEPCID) 20-0.9 MG/50ML-% Inject 50 mLs (20 mg total) into the vein every 12 (twelve) hours. 50 mL   . fexofenadine-pseudoephedrine (ALLEGRA-D) 60-120 MG 12  hr tablet Take 1 tablet by mouth every morning.     . Fluticasone-Salmeterol (ADVAIR) 250-50 MCG/DOSE AEPB Inhale 1 puff into the lungs 2 (two) times daily.    Marland Kitchen ibuprofen (ADVIL,MOTRIN) 200 MG tablet Take 600 mg by mouth every 6 (six) hours as needed for mild pain.    Marland Kitchen insulin aspart (NOVOLOG) 100 UNIT/ML injection Inject 0-15 Units into the skin every 4 (four) hours. 10 mL 11  . insulin aspart (NOVOLOG) 100 UNIT/ML injection Inject 3 Units into the skin every 4 (four) hours. 10 mL 11  . insulin glargine (LANTUS) 100 UNIT/ML injection Inject 0.18 mLs (18 Units total) into the skin at bedtime. 10 mL 11  . ipratropium (ATROVENT) 0.02 % nebulizer solution Take 2.5 mLs (0.5 mg total) by nebulization 3 (three) times daily. 75 mL 12  . levalbuterol (XOPENEX) 1.25 MG/0.5ML nebulizer solution Take 1.25 mg by nebulization 3 (three) times daily. 1 each 12  . mouth rinse LIQD solution 15 mLs by Mouth Rinse route QID.  0  . Nutritional Supplements (FEEDING SUPPLEMENT, VITAL AF 1.2 CAL,) LIQD Place 1,000 mLs into feeding tube continuous.    . polyethylene glycol (MIRALAX / GLYCOLAX) packet Take 17 g by mouth daily as needed for moderate constipation. 14 each 0  . predniSONE (DELTASONE) 5 MG tablet 1 tablet (5 mg total) by Per NG tube route daily with breakfast.      Home: Home Living Living Arrangements: Spouse/significant other Available Help at Discharge: Family, Available 24 hours/day Type of Home: House Home Equipment: None  Lives With: Spouse, Family, Daughter, Other (Comment) (living at wife's sister's home until their house is ready)   Functional History: Prior Function Level of Independence: Independent  Functional Status:  Mobility:          ADL:    Cognition: Cognition Overall Cognitive Status: Impaired/Different from baseline Orientation Level: Oriented to person, Oriented to place, Disoriented to time, Disoriented to situation Cognition Overall Cognitive Status:  Impaired/Different from baseline   Height 5\' 9"  (1.753 m), weight 79.5 kg (175 lb 3.2 oz). Physical Exam  Nursing note and vitals reviewed. Constitutional: He is oriented to person, place, and time. He appears well-developed and well-nourished. He has a sickly appearance. No distress.  Anxious thin male with PMSV in place.  Restless and easily frustrated. Desaturated to low 80's attempts at fluid intake.   HENT:  Head: Normocephalic and atraumatic.  Mouth/Throat: Oropharynx is clear and moist.  Eyes: Conjunctivae are normal. Pupils are equal, round, and reactive to light. Right eye exhibits no discharge.  Neck: Normal range of motion.  Cuffed # 6 trach in place with dry gauze. Tolerating PMSV without stridor.  Cardiovascular: Normal rate and regular rhythm.   Respiratory: Breath sounds normal. No stridor. No respiratory distress. He exhibits no tenderness.  Noted to have pursed lip breathing at times. Scattered rhonchi  GI: Soft. Bowel sounds are normal. He exhibits no distension. There is no tenderness.  Musculoskeletal: He exhibits no edema or tenderness.  Right shoulder in sling. Still tender with  subtle movements of RUE.  Neurological: He is alert and oriented to person, place, and time. No cranial nerve deficit.  Able to answer basic questions with encouragement/ego support. Was unable to recall month without cues to use calender. Could tell me the year 2017.  Reported age as [age] years. He is able to follow basic motor commands. Reasonable insight.  LUE with dysesthesias? Diffuse weakness LUE>BLE. RUE limited by sling--4/5 HI. LUE:  3/5 deltoid, bicep, tricep and 4/5 wrist and HI. LE: 3/ HF, KE and 4/5 ADF/PF. No sensory deficits.   Skin: Skin is warm and dry. He is not diaphoretic.  Psychiatric: His mood appears anxious. His speech is rapid and/or pressured. He is withdrawn. Cognition and memory are impaired. He expresses impulsivity.  Slightly anxious    No results found for this or  any previous visit (from the past 48 hour(s)). No results found.     Medical Problem List and Plan: 1.  Functional, mobility and cognitive deficits  secondary to debility/?anoxic encephalopathy  -admit to inpatient rehab 2.  DVT Prophylaxis/Anticoagulation: Pharmaceutical: Lovenox 3. Pain Management: Add low dose Neurontin to help with neuropathy BUE. Was using ibuprofen daily for myalgias at home.  4. Mood: Team to provide ego support. LCSW to follow for evaluation and support.  5. Neuropsych: This patient is not fully capable of making decisions on his own behalf. 6. Skin/Wound Care: routine trach care. Continue airmatress overlay for pressure relief measures.  7. Fluids/Electrolytes/Nutrition: Monitor I/O. Check lytes in am. Offer supplements during the day.  8. VDRF: Cuffed #6 trach in place. No secretions noted and tolerating PMSV with 40% ATC.   -?downsize and decannulate while here on inpatient rehab depending upon clinical picture   9. COPD with IPF: On prednisone 20 mg daily with  10. Anxiety: Will change Zoloft back to Celexa as this was managing anxiety at home.  Will add low dose xanax prn for use. May be exacerbated by modafinil ( will d/c) and steroids.  11. Hyperglycemia: Likely due to illness and steroids. Will check Hgb A1c. Continue Lantus for now. Will monitor BS ac/hs and use SSI for better BS control.    Post Admission Physician Evaluation: 1. Functional deficits secondary  to Debility and anoxic encephalopathy. 2. Patient is admitted to receive collaborative, interdisciplinary care between the physiatrist, rehab nursing staff, and therapy team. 3. Patient's level of medical complexity and substantial therapy needs in context of that medical necessity cannot be provided at a lesser intensity of care such as a SNF. 4. Patient has experienced substantial functional loss from his/her baseline which was documented above under the "Functional History" and "Functional Status"  headings.  Judging by the patient's diagnosis, physical exam, and functional history, the patient has potential for functional progress which will result in measurable gains while on inpatient rehab.  These gains will be of substantial and practical use upon discharge  in facilitating mobility and self-care at the household level. 5. Physiatrist will provide 24 hour management of medical needs as well as oversight of the therapy plan/treatment and provide guidance as appropriate regarding the interaction of the two. 6. The Preadmission Screening has been reviewed and patient status is unchanged unless otherwise stated above. 7. 24 hour rehab nursing will assist with bladder management, bowel management, safety, skin/wound care, disease management, medication administration, pain management and patient education  and help integrate therapy concepts, techniques,education, etc. 8. PT will assess and treat for/with: Lower extremity strength, range of motion, stamina, balance, functional mobility,  safety, adaptive techniques and equipment, NMR, activity tolerance ,family education.   Goals are: supervision to min assist. 9. OT will assess and treat for/with: ADL's, functional mobility, safety, upper extremity strength, adaptive techniques and equipment, NMR, activity tolerance, community reintegration, family ed.   Goals are: supervision to min assist. Therapy may proceed with showering this patient. 10. SLP will assess and treat for/with: cognition, communication, education.  Goals are: supervision. 11. Case Management and Social Worker will assess and treat for psychological issues and discharge planning. 12. Team conference will be held weekly to assess progress toward goals and to determine barriers to discharge. 13. Patient will receive at least 3 hours of therapy per day at least 5 days per week. 14. ELOS: 15-20 days       15. Prognosis:  excellent     Ranelle OysterZachary T. Kahmari Herard, MD, [facility] [place]  [facility] Medicine [facility] Rehabilitation 09/15/2016  09/15/2016

## 2016-09-16 ENCOUNTER — Inpatient Hospital Stay (HOSPITAL_COMMUNITY): Payer: BLUE CROSS/BLUE SHIELD | Admitting: Speech Pathology

## 2016-09-16 ENCOUNTER — Inpatient Hospital Stay (HOSPITAL_COMMUNITY): Payer: BLUE CROSS/BLUE SHIELD | Admitting: *Deleted

## 2016-09-16 ENCOUNTER — Inpatient Hospital Stay (HOSPITAL_COMMUNITY): Payer: BLUE CROSS/BLUE SHIELD

## 2016-09-16 LAB — COMPREHENSIVE METABOLIC PANEL
ALBUMIN: 2.6 g/dL — AB (ref 3.5–5.0)
ALK PHOS: 52 U/L (ref 38–126)
ALT: 42 U/L (ref 17–63)
AST: 17 U/L (ref 15–41)
Anion gap: 10 (ref 5–15)
BUN: 10 mg/dL (ref 6–20)
CALCIUM: 8.7 mg/dL — AB (ref 8.9–10.3)
CHLORIDE: 102 mmol/L (ref 101–111)
CO2: 26 mmol/L (ref 22–32)
CREATININE: 0.48 mg/dL — AB (ref 0.61–1.24)
GFR calc non Af Amer: >60 mL/min (ref >60)
GLUCOSE: 101 mg/dL — AB (ref 65–99)
Potassium: 3.3 mmol/L — ABNORMAL LOW (ref 3.5–5.1)
SODIUM: 138 mmol/L (ref 135–145)
Total Bilirubin: 0.5 mg/dL (ref 0.3–1.2)
Total Protein: 5.9 g/dL — ABNORMAL LOW (ref 6.5–8.1)

## 2016-09-16 LAB — CBC WITH DIFFERENTIAL/PLATELET
BASOS ABS: 0 10*3/uL (ref 0.0–0.1)
BASOS PCT: 0 %
EOS ABS: 0.2 10*3/uL (ref 0.0–0.7)
EOS PCT: 2 %
HCT: 39.3 % (ref 39.0–52.0)
HEMOGLOBIN: 13.2 g/dL (ref 13.0–17.0)
LYMPHS ABS: 2.7 10*3/uL (ref 0.7–4.0)
Lymphocytes Relative: 24 %
MCH: 30.1 pg (ref 26.0–34.0)
MCHC: 33.6 g/dL (ref 30.0–36.0)
MCV: 89.5 fL (ref 78.0–100.0)
Monocytes Absolute: 0.8 10*3/uL (ref 0.1–1.0)
Monocytes Relative: 7 %
NEUTROS PCT: 67 %
Neutro Abs: 7.6 10*3/uL (ref 1.7–7.7)
PLATELETS: 160 10*3/uL (ref 150–400)
RBC: 4.39 MIL/uL (ref 4.22–5.81)
RDW: 14 % (ref 11.5–15.5)
WBC: 11.4 10*3/uL — AB (ref 4.0–10.5)

## 2016-09-16 LAB — GLUCOSE, CAPILLARY
GLUCOSE-CAPILLARY: 135 mg/dL — AB (ref 65–99)
Glucose-Capillary: 131 mg/dL — ABNORMAL HIGH (ref 65–99)
Glucose-Capillary: 187 mg/dL — ABNORMAL HIGH (ref 65–99)
Glucose-Capillary: 153 mg/dL — ABNORMAL HIGH (ref 65–99)

## 2016-09-16 MED ORDER — POTASSIUM CHLORIDE CRYS ER 20 MEQ PO TBCR
20.0000 meq | EXTENDED_RELEASE_TABLET | Freq: Every day | ORAL | Status: DC
  Administered 2016-09-16 – 2016-09-26 (×11): 20 meq via ORAL
  Filled 2016-09-16 (×11): qty 1

## 2016-09-16 MED ORDER — POLYETHYLENE GLYCOL 3350 17 G PO PACK
17.0000 g | PACK | Freq: Every day | ORAL | Status: DC
  Administered 2016-09-16 – 2016-09-18 (×3): 17 g via ORAL
  Filled 2016-09-16 (×12): qty 1

## 2016-09-16 NOTE — Evaluation (Signed)
Physical Therapy Assessment and Plan  Patient Details  Name: Bradley [name] MRN: [id number] Date of Birth: 21-Apr-1961  PT Diagnosis: Muscle weakness Rehab Potential: Good ELOS: 10-12 days   Today's Date: 09/16/2016 PT Individual Time: 1100-1210 PT Individual Time Calculation (min): 70 min     Problem List:  Patient Active Problem List   Diagnosis Date Noted  . Debility 09/15/2016  . Anoxic encephalopathy ([place])   . Tracheostomy status ([place])   . ARDS (adult respiratory distress syndrome) ([place])   . Acute respiratory failure with hypoxia ([place]) 08/18/2016  . Community acquired pneumonia 08/18/2016  . COPD exacerbation ([place]) 08/18/2016  . Depression 08/18/2016  . Sepsis ([place]) 08/18/2016  . Complete tear of right rotator cuff 08/14/2016  . Right rotator cuff tear 08/14/2016  . Cigarette smoker 07/23/2015  . Obesity 07/23/2015  . Pulmonary fibrosis ([place]) 07/22/2015    Past Medical History:  Past Medical History:  Diagnosis Date  . Anxiety    Citalopram controls "panic attacks"  . Asthma   . COPD (chronic obstructive pulmonary disease) ([place])   . Dyspnea    with exertion  . History of kidney stones    x3-4 - lithotripsy  . Pneumonia    08/11/16- 3 years ago  . Pulmonary fibrosis ([place])    Past Surgical History:  Past Surgical History:  Procedure Laterality Date  . COLONOSCOPY W/ POLYPECTOMY    . LITHOTRIPSY    . MULTIPLE TOOTH EXTRACTIONS     19 teeth extracted at once  . SHOULDER ARTHROSCOPY WITH ROTATOR CUFF REPAIR Right 08/14/2016   Procedure: RIGHT SHOULDER ARTHROSCOPY,DEBRIDEMENT, OPEN ROTATOR CUFF REPAIR;  Surgeon: Marybelle Killings, MD;  Location: [facility];  Service: Orthopedics;  Laterality: Right;  . TONSILLECTOMY      Assessment & Plan Clinical Impression: Bradley [name] is a 55 year old male with h/o of COPD--ongoing tobacco use, interstitial pulmonary fibrosis, DOE, anxiety disorder, right shoulder arthroscopy 08/14/16 by Dr. Lorin Mercy who developed progressive SOB  with productive cough and was admitted on 08/16/16 with acute hypoxic/hypercarbic respiratory failure due to IPF v/sp post ALI v/s HCAP. He was treated with broad spectrum antibiotics and steroids but progressed to VDRF requiring tracheostomy 11/6. He continued to require vent support for rest. 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   Patient currently requires max with mobility secondary to muscle weakness.  Prior to hospitalization, patient was independent  with mobility and lived with Family, Spouse in a House home.  Home access is  7, 5Stairs to enter.  Patient will benefit from skilled PT intervention to maximize safe functional mobility for planned discharge home with 24 hour supervision.  Anticipate patient will benefit from follow up [place] at discharge.  PT - End of Session Activity Tolerance: Tolerates 30+ min activity with multiple rests Endurance Deficit: Yes PT Assessment Rehab Potential (ACUTE/IP ONLY): Good Barriers to Discharge: Decreased caregiver support PT Patient demonstrates impairments in the following area(s): Balance;Endurance;Motor;Pain;Safety PT Transfers Functional Problem(s): Bed Mobility;Bed to Chair;Car PT Locomotion Functional Problem(s): Ambulation;Wheelchair Mobility;Stairs PT Plan PT Intensity: Minimum of 1-2 x/day ,45 to 90 minutes PT Frequency: 5 out of 7 days PT Duration Estimated Length of Stay: 10-12 days PT Treatment/Interventions: Ambulation/gait training;Neuromuscular re-education;Stair training;UE/LE Strength taining/ROM;Wheelchair propulsion/positioning;Therapeutic Activities;Balance/vestibular training;Functional mobility training;Patient/family education;Therapeutic Exercise PT Transfers Anticipated Outcome(s): Mod I  PT Locomotion Anticipated Outcome(s): Mod I with LRAD or no device PT Recommendation Recommendations for Other Services:  Neuropsych consult  Follow Up Recommendations: Home health PT Patient destination: Home Equipment Recommended: To be determined  Skilled Therapeutic Intervention  Patient in bed at the beginning of session, oriented to place and person but not able to state day and month, agrees to therapy session, son present during therapy, very attentive.   bed mobility initiated with rolling to L with min A and mod A for rolling to R due to recent rotator cuff repair, (sling on ). Supine to sit training x 3 with mod to max A, patient is anxious and has very little confidence in his anilities. Definitve muscle atrophy and weakness noted in b LE. Decreased core strength results in poor sitting balance and backward lean.  Sit to stand from elevated bed with max .A. Stand pivot to w/c with max A- decreased ability to sequence. Multiple sit to stands performed with max A- orthostatic hypotension prevents form increasing standing time and initiating gait training.  Training in sit to stand with use of steady to assure safety when recommending use of assistive device to nursing for transfers. Patient able to assist a lot more with steady,needs cues not to pull with R UE. RN notified of BP findings and methods of transfers. BP in sitting 104/63 , standing 86/60- symptomatic.   At the end of session patient and son participated in discussion bout goals and POC- both in agreement.  Patient lefty in room with all needs within reach, sitting in recliner.       PT Evaluation Precautions/Restrictions Precautions Precautions: Shoulder;Other (comment) Type of Shoulder Precautions: rotator cuff Precaution Comments: trach  Restrictions Weight Bearing Restrictions: No General Chart Reviewed: Yes Family/Caregiver Present: Yes Vital SignsTherapy Vitals Pulse Rate: 97 Resp: 18 Patient Position (if appropriate): Lying Oxygen Therapy SpO2: 95 % O2 Device: Tracheostomy Collar O2 Flow Rate (L/min): 6 L/min FiO2 (%): 28  % Pain Pain Assessment Pain Assessment: 0-10 Pain Score: 8  Pain Type: Acute pain;Surgical pain Pain Location: Shoulder Pain Orientation: Right Pain Descriptors / Indicators: Constant Pain Onset: On-going Patients Stated Pain Goal: 0 Multiple Pain Sites: No Home Living/Prior Functioning Home Living Available Help at Discharge: Family;Available 24 hours/day Type of Home: House Home Access: Stairs to enter [employer] of Steps:  7, 5 Entrance Stairs-Rails: Can reach both Home Layout: One level Bathroom Shower/Tub: Chiropodist: Standard Bathroom Accessibility: Yes  Lives With: Family;Spouse Prior Function Level of Independence: Independent with basic ADLs;Independent with gait;Independent with transfers  Able to Take Stairs?: Reciprically Driving: Yes Vocation: Full time employment Vocation Requirements: Personnel officer Comments: hunting Vision/Perception     Cognition Overall Cognitive Status: Impaired/Different from baseline Orientation Level: Oriented to person;Oriented to place Attention: Selective Selective Attention: Appears intact Memory: Impaired Memory Impairment: Decreased recall of new information Awareness: Impaired Awareness Impairment: Emergent impairment Problem Solving: Impaired Problem Solving Impairment: Functional basic Safety/Judgment: Appears intact Sensation   Motor  Motor Motor: Within Functional Limits  Mobility Bed Mobility Bed Mobility: Rolling Right;Rolling Left;Right Sidelying to Sit Rolling Right: 4: Min assist Rolling Left: 4: Min assist Right Sidelying to Sit: 3: Mod assist Transfers Transfers: Yes Sit to Stand: 2: Max assist Sit to Stand Details: Verbal cues for technique;Manual facilitation for weight shifting Stand Pivot Transfers: 2: Max assist Stand Pivot Transfer Details: Verbal cues for precautions/safety;Manual facilitation for weight shifting Locomotion  Ambulation Ambulation:  No Gait Gait: No Stairs / Additional Locomotion Stairs: No Wheelchair Mobility Wheelchair Mobility: Yes Wheelchair Assistance: 4: Advertising account executive Details: Verbal cues for technique;Verbal cues for  precautions/safety;Tactile cues for sequencing Wheelchair Propulsion: Left upper extremity;Both lower extermities Wheelchair Parts Management: Needs assistance Distance: 150  Trunk/Postural Assessment  Cervical Assessment Cervical Assessment: Within Functional Limits Thoracic Assessment Thoracic Assessment: Within Functional Limits Lumbar Assessment Lumbar Assessment: Within Functional Limits Postural Control Postural Control: Deficits on evaluation Trunk Control: Decreased core strength Righting Reactions: decreased ability to maintain standing, pushes backwards  Balance Balance Balance Assessed: Yes Dynamic Sitting Balance Dynamic Sitting - Balance Support: No upper extremity supported Dynamic Sitting - Level of Assistance: 4: Min assist Sitting balance - Comments: EOB w/o UE support , 8 min sitting with complains of back pain, and backward lean, needs up to min A Static Standing Balance Static Standing - Balance Support: Left upper extremity supported Static Standing - Level of Assistance: 2: Max assist Static Standing - Comment/# of Minutes: 1 (Decreased ability to tolerate standing due to orthostatic hypotension) Extremity Assessment      RLE Assessment RLE Assessment: Exceptions to [facility] RLE Strength RLE Overall Strength: Deficits LLE Assessment LLE Assessment: Exceptions to [facility] LLE Strength LLE Overall Strength: Deficits   See Function Navigator for Current Functional Status.   Refer to Care Plan for Long Term Goals  Recommendations for other services: Neuropsych  Discharge Criteria: Patient will be discharged from PT if patient refuses treatment 3 consecutive times without medical reason, if treatment goals not met, if there is a change in medical  status, if patient makes no progress towards goals or if patient is discharged from hospital.  The above assessment, treatment plan, treatment alternatives and goals were discussed and mutually agreed upon: by patient and family.   Guadlupe Spanish 09/16/2016, 12:32 PM

## 2016-09-16 NOTE — Evaluation (Signed)
Occupational Therapy Assessment and Plan  Patient Details  Name: Bradley [name] MRN: [id number] Date of Birth: Nov 20, 1960  OT Diagnosis: acute pain and muscle weakness (generalized) Rehab Potential: Rehab Potential (ACUTE ONLY): Good ELOS: 14-21 days   Today's Date: 09/16/2016 OT Individual Time: 1300-1400 OT Individual Time Calculation (min): 60 min      Problem List:  Patient Active Problem List   Diagnosis Date Noted  . Debility 09/15/2016  . Anoxic encephalopathy ([place])   . Tracheostomy status ([place])   . ARDS (adult respiratory distress syndrome) ([place])   . Acute respiratory failure with hypoxia ([place]) 08/18/2016  . Community acquired pneumonia 08/18/2016  . COPD exacerbation ([place]) 08/18/2016  . Depression 08/18/2016  . Sepsis ([place]) 08/18/2016  . Complete tear of right rotator cuff 08/14/2016  . Right rotator cuff tear 08/14/2016  . Cigarette smoker 07/23/2015  . Obesity 07/23/2015  . Pulmonary fibrosis ([place]) 07/22/2015    Past Medical History:  Past Medical History:  Diagnosis Date  . Anxiety    Citalopram controls "panic attacks"  . Asthma   . COPD (chronic obstructive pulmonary disease) ([place])   . Dyspnea    with exertion  . History of kidney stones    x3-4 - lithotripsy  . Pneumonia    08/11/16- 3 years ago  . Pulmonary fibrosis ([place])    Past Surgical History:  Past Surgical History:  Procedure Laterality Date  . COLONOSCOPY W/ POLYPECTOMY    . LITHOTRIPSY    . MULTIPLE TOOTH EXTRACTIONS     19 teeth extracted at once  . SHOULDER ARTHROSCOPY WITH ROTATOR CUFF REPAIR Right 08/14/2016   Procedure: RIGHT SHOULDER ARTHROSCOPY,DEBRIDEMENT, OPEN ROTATOR CUFF REPAIR;  Surgeon: Marybelle Killings, MD;  Location: [facility];  Service: Orthopedics;  Laterality: Right;  . TONSILLECTOMY      Assessment & Plan Clinical Impression: Patient is a 55 y.o. male with h/o of COPD--ongoing tobacco use, interstitial pulmonary fibrosis, dyspnea on exertion, anxiety disorder, right  shoulder arthroscopy 08/14/16 by Dr. Lorin Mercy who developed progressive SOB with productive cough and was admitted on 08/16/16 with acute hypoxic/hypercarbic respiratory failure due to interstitial pulmonary fibrosis v/sp post ALI v/s hospital acquired pneumonia. He was treated with broad spectrum antibiotics and steroids but progressed to VDRF requiring tracheostomy 11/6. He continued to require vent support for rest. 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 He was transferred to [facility] for vent wean and rehab on 09/08/16.   He has been weaned off the vent and tolerating PMSV during the day. PCCM recommends continue valve during thd day and capping trials once a little stronger. He is tolerating dysphagia II, thin liquids. Mentation improved but he continues to have high levels of anxiety which is worse at nights. He is tolerating increase in activity and was able to sit in chair.  Patient transferred to CIR on 09/15/2016 .    Patient currently requires max with basic self-care skills secondary to muscle weakness and decreased cardiorespiratoy endurance.  Prior to hospitalization, patient could complete BADL independent .  Patient will benefit from skilled intervention to decrease level of assist with basic self-care skills prior to discharge home with care partner.  Anticipate patient will require minimal physical assistance and follow up outpatient.  OT - End of Session Activity Tolerance: Tolerates 10 - 20 min activity with multiple rests Endurance Deficit: Yes OT Assessment Rehab Potential (ACUTE ONLY): Good Barriers to Discharge: Decreased caregiver support Barriers to Discharge Comments: Spouse limited with  assist d/t hx of fibromyalgia OT Patient demonstrates impairments in the following area(s): Balance;Behavior;Endurance;Pain;Safety;Vision OT Basic ADL's Functional Problem(s): Eating;Grooming;Bathing;Dressing;Toileting OT  Transfers Functional Problem(s): Toilet;Tub/Shower OT Additional Impairment(s): Fuctional Use of Upper Extremity OT Plan OT Intensity: Minimum of 1-2 x/day, 45 to 90 minutes OT Frequency: 5 out of 7 days OT Duration/Estimated Length of Stay: 14-21 days OT Treatment/Interventions: Balance/vestibular training;Discharge planning;Disease mangement/prevention;Functional mobility training;Pain management;Patient/family education;Therapeutic Activities;Therapeutic Exercise;UE/LE Strength taining/ROM;UE/LE Coordination activities OT Self Feeding Anticipated Outcome(s): Supervision and setup OT Basic Self-Care Anticipated Outcome(s): Min A OT Toileting Anticipated Outcome(s): Min A OT Bathroom Transfers Anticipated Outcome(s): Min A OT Recommendation Patient destination: Home Follow Up Recommendations: Outpatient OT Equipment Recommended: To be determined   Skilled Therapeutic Intervention OT initial evaluation completed with treatment provided to address pt/family education on methods and goals of treatment, DME training relating to use of supplemental oxygen and transfer assist devices, general endurance, transfers, sit<>stand, and functional mobility.   Pt required extra time and multiple rest breaks to progress through session but was unable to attempt bathing/dressing due to poor endurance.   Pt fatigued quickly after 1 sit<>stand from recliner in prep for transfer to w/c.   After w/c transfer pt was too fatigued to attempt lateral scoot to drop-arm BSC and required STEDY lift transfer back to recliner.     OT Evaluation Precautions/Restrictions  Precautions Precautions: Shoulder;Other (comment);Fall Type of Shoulder Precautions: Hx of R-RTC repair 08/14/16 Shoulder Interventions: Shoulder sling/immobilizer Precaution Comments: trach  Restrictions Weight Bearing Restrictions: No  General Chart Reviewed: Yes Family/Caregiver Present: Yes (Josh, son)  Vital Signs Therapy Vitals Temp:  97.5 F (36.4 C) Temp Source: Oral Pulse Rate: 87 Resp: 18 BP: 102/75 Patient Position (if appropriate): Lying Oxygen Therapy SpO2: 96 % O2 Device: Tracheostomy Collar O2 Flow Rate (L/min): 6 L/min FiO2 (%): 28 %  Pain Pain Assessment Pain Assessment: 0-10 Pain Score: 0-No pain Pain Type: Acute pain Pain Location: Generalized Pain Descriptors / Indicators: Aching Pain Onset: On-going Pain Intervention(s): Medication (See eMAR) Multiple Pain Sites: No  Home Living/Prior Functioning Home Living Available Help at Discharge: Family, Available 24 hours/day Type of Home: House Home Access: Stairs to enter Technical brewer of Steps: 7 Entrance Stairs-Rails: Can reach both, Right, Left Home Layout: One level Bathroom Shower/Tub: Chiropodist: Standard Bathroom Accessibility: Yes Additional Comments: Grab bars in shower and beside commode.   New house will be available in December.    Pt and spouse currently living at [facility] and [address] house  Lives With: Family, Spouse IADL History Homemaking Responsibilities: Yes Meal Prep Responsibility: Secondary Laundry Responsibility: Secondary Cleaning Responsibility: Secondary Bill Paying/Finance Responsibility: Secondary Shopping Responsibility: No Child Care Responsibility: No Homemaking Comments: Helps some Current License: Yes Mode of Transportation: Car Education: HS, + Hotel manager college Occupation: Full time employment Type of Occupation: Physiological scientist, [employer] Leisure and Hobbies: suspended Prior Function Level of Independence: Independent with basic ADLs, Independent with gait, Independent with transfers  Able to Take Stairs?: Reciprically Driving: Yes Vocation: Full time employment 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; works a lot  ADL ADL ADL Comments: See Functional Assessment Tool  Vision/Perception  Vision-  History Baseline Vision/History: Wears glasses Wears Glasses: At all times Patient Visual Report: Blurring of vision (20/30 corrected, glasses lost at [facility]) Vision- Assessment Additional Comments: Wears glasses all the time; glasses were lost at [facility] (by housekeeping)   Cognition Overall Cognitive Status: Impaired/Different from baseline Arousal/Alertness: Awake/alert Orientation Level: Place;Person Year: 2017 Day  of Week: Correct Memory: Impaired Memory Impairment: Decreased recall of new information Immediate Memory Recall: Sock;Blue;Bed Attention: Selective Selective Attention: Appears intact Awareness: Impaired Awareness Impairment: Emergent impairment Problem Solving: Impaired Problem Solving Impairment: Functional basic Safety/Judgment: Appears intact  Sensation Sensation Light Touch: Appears Intact Stereognosis: Appears Intact Hot/Cold: Appears Intact Proprioception: Appears Intact Coordination Gross Motor Movements are Fluid and Coordinated: No Fine Motor Movements are Fluid and Coordinated: Yes Coordination and Movement Description: limited right shoulder AROM d/t recent R-RTC repair, 08/14/16  Motor  Motor Motor: Within Functional Limits  Mobility  Bed Mobility Bed Mobility: Rolling Right;Rolling Left;Right Sidelying to Sit Rolling Right: 4: Min assist Rolling Left: 4: Min assist Right Sidelying to Sit: 3: Mod assist Transfers Sit to Stand: 2: Max assist Sit to Stand Details: Verbal cues for technique;Manual facilitation for weight shifting   Trunk/Postural Assessment  Cervical Assessment Cervical Assessment: Within Functional Limits Thoracic Assessment Thoracic Assessment: Within Functional Limits Lumbar Assessment Lumbar Assessment: Within Functional Limits Postural Control Postural Control: Deficits on evaluation Trunk Control: Decreased core strength Righting Reactions: decreased ability to maintain standing, pushes backwards    Balance Dynamic Sitting Balance Dynamic Sitting - Balance Support: No upper extremity supported Dynamic Sitting - Level of Assistance: 4: Min assist Sitting balance - Comments: EOB w/o UE support , 8 min sitting with complains of back pain, and backward lean, needs up to min A Static Standing Balance Static Standing - Balance Support: Left upper extremity supported Static Standing - Level of Assistance: 2: Max assist  Extremity/Trunk Assessment RUE Assessment RUE Assessment: Exceptions to [facility] RUE AROM (degrees) RUE Overall AROM Comments: Limited due to premorbid condition, s/p R-RTC repair, 08/14/16 LUE Assessment LUE Assessment: Within Functional Limits   See Function Navigator for Current Functional Status.   Refer to Care Plan for Long Term Goals  Recommendations for other services: Other: Ortho f/u to clarify R-RTC repair protocols  Discharge Criteria: Patient will be discharged from OT if patient refuses treatment 3 consecutive times without medical reason, if treatment goals not met, if there is a change in medical status, if patient makes no progress towards goals or if patient is discharged from hospital.  The above assessment, treatment plan, treatment alternatives and goals were discussed and mutually agreed upon: by patient and by family  [facility] 09/16/2016, 4:37 PM

## 2016-09-16 NOTE — Progress Notes (Signed)
Brachial PICC line not present on assessment on 09/15/2016 at 2000. Peripheral IV to left forearm on assessment. IV is clean, dry, and intact and is saline-locked.  Bradley [name] 09/16/2016 7:09 AM

## 2016-09-16 NOTE — Evaluation (Signed)
Speech Language Pathology Assessment and Plan  Patient Details  Name: Bradley [name] MRN: [id number] Date of Birth: Oct 08, 1961  SLP Diagnosis: Dysphagia;Voice disorder  Rehab Potential: Good ELOS: 14-21 days     Today's Date: 09/16/2016 SLP Individual Time: 0930-1030 SLP Individual Time Calculation (min): 60 min    Problem List:  Patient Active Problem List   Diagnosis Date Noted  . Debility 09/15/2016  . Anoxic encephalopathy ([place])   . Tracheostomy status ([place])   . ARDS (adult respiratory distress syndrome) ([place])   . Acute respiratory failure with hypoxia ([place]) 08/18/2016  . Community acquired pneumonia 08/18/2016  . COPD exacerbation ([place]) 08/18/2016  . Depression 08/18/2016  . Sepsis ([place]) 08/18/2016  . Complete tear of right rotator cuff 08/14/2016  . Right rotator cuff tear 08/14/2016  . Cigarette smoker 07/23/2015  . Obesity 07/23/2015  . Pulmonary fibrosis ([place]) 07/22/2015   Past Medical History:  Past Medical History:  Diagnosis Date  . Anxiety    Citalopram controls "panic attacks"  . Asthma   . COPD (chronic obstructive pulmonary disease) ([place])   . Dyspnea    with exertion  . History of kidney stones    x3-4 - lithotripsy  . Pneumonia    08/11/16- 3 years ago  . Pulmonary fibrosis ([place])    Past Surgical History:  Past Surgical History:  Procedure Laterality Date  . COLONOSCOPY W/ POLYPECTOMY    . LITHOTRIPSY    . MULTIPLE TOOTH EXTRACTIONS     19 teeth extracted at once  . SHOULDER ARTHROSCOPY WITH ROTATOR CUFF REPAIR Right 08/14/2016   Procedure: RIGHT SHOULDER ARTHROSCOPY,DEBRIDEMENT, OPEN ROTATOR CUFF REPAIR;  Surgeon: Marybelle Killings, MD;  Location: [place];  Service: Orthopedics;  Laterality: Right;  . TONSILLECTOMY      Assessment / Plan / Recommendation Clinical Impression   Bradley [name] is a 55 year old male with h/o of COPD--ongoing tobacco use, interstitial pulmonary fibrosis,  DOE, anxiety disorder, right shoulder arthroscopy 08/14/16 by  Dr. Lorin Mercy who developed progressive SOB with productive cough and was admitted on 08/16/16 with acute hypoxic/hypercarbic respiratory failure due to IPF v/sp post ALI v/s HCAP. He was treated with broad spectrum antibiotics and steroids but progressed to VDRF requiring tracheostomy 11/6. He continued to require vent support for rest. 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 He was transferred to [facility] for vent wean and rehab on 09/08/16.  He has been weaned off the vent and tolerating PMSV during the day.  PCCM recommends continue valve during thd day and capping trials once a little stronger. He is tolerating dysphagia II, thin liquids. Mentation improved but he continues to have high levels of anxiety which is worse at nights. Pt admitted to CIR on 09/15/2016.  SLP evaluation completed on 09/16/2016 with the following results:  Pt presents with good toleration of currently prescribed diet which appears consistent with MBS.  Pt with multiple swallows with large sips of thin liquids via straw.  Vocal quality remained clear before and after PO trials and pt demonstrated no coughing or throat clearing across any consistencies.   Pt also demonstrates good toleration of speaking valve.  Pt wearing valve upon arrival with good vocal intensity and breath support for speech.  No s/s of discomfort or changes in vitals with valve use versus valve removal.     Although no formal cognitive assessment was completed on this date due to focus on skilled education regarding valve use and swallowing  function (See below), pt does present with mild cognitive impairment characterized by decreased recall of new information and decreased functional problem solving.  Would recommend formal assessment at next available appointment.     Skilled Therapeutic Interventions          Passy Muir Speaking Valve and Bedside Swallow Evaluation Completed.  Pt consumed trials of  advanced solids with adequate oropharyngeal swallow function.  Pt with timely mastication of solids and complete clearance of residuals from the oral cavity post swallow.  Despite good function, therapist still recommends more conservative diet advancement with trials over 2-3 sessions given his debility in the setting of prolonged respiratory failure.  Discussed recommendations with pt and son who was present throughout duration of eval.  Pt also demonstrated poor recall and carryover of safe valve use and needed min assist to recall the need to deflate cuff prior to valve placement.  Pt needs manual assist with donning and doffing valve due to inconsistent use of right upper extremity secondary to shoulder surgery.  Reviewed and reinforced recommendations of valve use with pt and son.  Also discussed recommendation for changing pt to cuffless trach for safety as pt appears to be tolerating cuff deflation at this time with RN, pt, and family.    SLP Assessment  Patient will need skilled Speech Lanaguage Pathology Services during CIR admission    Recommendations  Patient may use Passy-Muir Speech Valve: During all waking hours (remove during sleep);During PO intake/meals;During all therapies with supervision PMSV Supervision: Intermittent MD: Please consider changing trach tube to : Cuffless SLP Diet Recommendations: Dysphagia 2 (Fine chop);Thin Liquid Administration via: Cup;Straw Medication Administration: Crushed with puree Supervision: Patient able to self feed;Full supervision/cueing for compensatory strategies Compensations: Slow rate;Small sips/bites Postural Changes and/or Swallow Maneuvers: Seated upright 90 degrees Oral Care Recommendations: Oral care BID Recommendations for Other Services: Neuropsych consult Patient destination: Home Follow up Recommendations: Outpatient SLP;24 hour supervision/assistance;Home Health SLP Equipment Recommended: To be determined    SLP Frequency 3 to 5  out of 7 days   SLP Duration  SLP Intensity  SLP Treatment/Interventions 14-21 days   Minumum of 1-2 x/day, 30 to 90 minutes  Cueing hierarchy;Dysphagia/aspiration precaution training;Environmental controls;Internal/external aids;Patient/family education    Pain Pain Assessment Pain Assessment: 0-10 Pain Score: 8  Pain Type: Acute pain;Surgical pain Pain Location: Shoulder Pain Orientation: Right Pain Descriptors / Indicators: Constant Pain Onset: On-going Patients Stated Pain Goal: 0 Multiple Pain Sites: No  Prior Functioning Cognitive/Linguistic Baseline: Within functional limits Type of Home: House  Lives With: Family;Spouse Available Help at Discharge: Family;Available 24 hours/day Vocation: Full time employment  Function:  Eating Eating   Modified Consistency Diet: Yes Eating Assist Level: Supervision or verbal cues;Help with picking up utensils;Help managing cup/glass           Cognition Comprehension Comprehension assist level: Follows basic conversation/direction with no assist  Expression Expression assistive device: Talk trach valve Expression assist level: Expresses basic needs/ideas: With extra time/assistive device  Social Interaction Social Interaction assist level: Interacts appropriately with others with medication or extra time (anti-anxiety, antidepressant).  Problem Solving Problem solving assist level: Solves basic 75 - 89% of the time/requires cueing 10 - 24% of the time  Memory Memory assist level: Recognizes or recalls 75 - 89% of the time/requires cueing 10 - 24% of the time   Short Term Goals: Week 1: SLP Short Term Goal 1 (Week 1): Pt will return demonstration of donning and doffing speaking valve with supervision verbal cues.  SLP Short Term Goal 2 (Week 1): Pt will wear speaking valve with no changes in vitals or reports of discomfort for 60 minutes.   SLP Short Term Goal 3 (Week 1): Pt will consume trials of dys 3 textures with mod I  use of swallowing precautions over 2 consecutive sessions prior to advancement.   SLP Short Term Goal 4 (Week 1): Pt will verbalize safety precautions for valve use with supervision question cues.   Refer to Care Plan for Long Term Goals  Recommendations for other services: Neuropsych  Discharge Criteria: Patient will be discharged from SLP if patient refuses treatment 3 consecutive times without medical reason, if treatment goals not met, if there is a change in medical status, if patient makes no progress towards goals or if patient is discharged from hospital.  The above assessment, treatment plan, treatment alternatives and goals were discussed and mutually agreed upon: by patient and by family  Emilio Math 09/16/2016, 12:31 PM

## 2016-09-17 ENCOUNTER — Inpatient Hospital Stay (HOSPITAL_COMMUNITY): Payer: BLUE CROSS/BLUE SHIELD | Admitting: Occupational Therapy

## 2016-09-17 ENCOUNTER — Inpatient Hospital Stay (HOSPITAL_COMMUNITY): Payer: BLUE CROSS/BLUE SHIELD | Admitting: Speech Pathology

## 2016-09-17 ENCOUNTER — Inpatient Hospital Stay (HOSPITAL_COMMUNITY): Payer: BLUE CROSS/BLUE SHIELD

## 2016-09-17 LAB — GLUCOSE, CAPILLARY
GLUCOSE-CAPILLARY: 203 mg/dL — AB (ref 65–99)
GLUCOSE-CAPILLARY: 197 mg/dL — AB (ref 65–99)
Glucose-Capillary: 100 mg/dL — ABNORMAL HIGH (ref 65–99)
Glucose-Capillary: 223 mg/dL — ABNORMAL HIGH (ref 65–99)

## 2016-09-17 LAB — HEMOGLOBIN A1C
Hgb A1c MFr Bld: 6.9 % — ABNORMAL HIGH (ref 4.8–5.6)
MEAN PLASMA GLUCOSE: 151 mg/dL

## 2016-09-17 MED ORDER — IPRATROPIUM BROMIDE 0.02 % IN SOLN
0.5000 mg | Freq: Two times a day (BID) | RESPIRATORY_TRACT | Status: DC
  Administered 2016-09-18 – 2016-09-22 (×5): 0.5 mg via RESPIRATORY_TRACT
  Filled 2016-09-17 (×11): qty 2.5

## 2016-09-17 MED ORDER — LEVALBUTEROL HCL 0.63 MG/3ML IN NEBU
0.6300 mg | INHALATION_SOLUTION | Freq: Two times a day (BID) | RESPIRATORY_TRACT | Status: DC
  Administered 2016-09-18 – 2016-09-22 (×5): 0.63 mg via RESPIRATORY_TRACT
  Filled 2016-09-17 (×11): qty 3

## 2016-09-17 MED ORDER — IPRATROPIUM-ALBUTEROL 0.5-2.5 (3) MG/3ML IN SOLN
3.0000 mL | Freq: Four times a day (QID) | RESPIRATORY_TRACT | Status: DC | PRN
  Administered 2016-09-17: 3 mL via RESPIRATORY_TRACT
  Filled 2016-09-17: qty 3

## 2016-09-17 NOTE — Progress Notes (Signed)
Occupational Therapy Note  Patient Details  Name: Bradley [name] MRN: [id number] Date of Birth: 1961/09/26  Today's Date: 09/17/2016 OT Individual Time: 1300-1330 OT Individual Time Calculation (min): 30 min    Pt denied pain Individual Therapy  Pt on toilet with NT present.  Pt transferred to recliner in room with use of STEADY. Pt engaged in self feeding tasks (lunch). Pt required assistance opening can of soda but completed all self feeding tasks, including opening pudding container and condiments, without assistance.  Pt required min verbal cues for portion control.  Pt completed all tasks at supervision level.  Pt remained in recliner with all needs within reach and QRB in place.   Lavone NeriLanier, Jalyah Weinheimer [facility] 09/17/2016, 3:42 PM

## 2016-09-17 NOTE — Progress Notes (Signed)
Occupational Therapy Session Note  Patient Details  Name: Bradley FraiseDavid M Frasier MRN: [id number] Date of Birth: 1961-05-30  Today's Date: 09/17/2016 OT Individual Time: [id number] and 1100-1157 OT Individual Time Calculation (min): 51 min and 57 min  Short Term Goals: Week 1:  OT Short Term Goal 1 (Week 1): Complete toilet transfer with min assist OT Short Term Goal 2 (Week 1): Complete upper body bathing seated at EOB or sink, with mod assist OT Short Term Goal 3 (Week 1): Complete lower body bathing with mod assist using AE, prn OT Short Term Goal 4 (Week 1): Groom seated with mod assist OT Short Term Goal 5 (Week 1): Maintain supported standing balance for 2 minutes during assisted dressing  Skilled Therapeutic Interventions/Progress Updates:   Pt was sitting in recliner with wife Efraim KaufmannMelissa and son Ivin BootyJoshua present. Pt reported having trouble sleeping last night due to beeping of continuous 02 machine, but agreeable to ADL completion with setup while in recliner. Resting BP: 101/68, 02 sats 97% on 6L. Pt required overall Mod A for bathing/dressing with pt completing sit<stand with RW and Max A for pericare and lifting pants over hips. 2 helpers assisted with LB ADLs for safety (spouse assisted with pericare). Pt reported no dizziness afterwards, BP 105/72, 02 sats 97%.At end of ADL, BP was 106/74 with stable 02. Pt was left in recliner with safety belt donned and spouse at time of departure. Nursing made aware of vitals during tx.   2nd Session 1:1 Tx (51 minutes) Skilled OT session completed with focus on ADL retraining, collaboration with respiratory therapy, and therapeutic exercise. RT was notified regarding making portable 02 compatible with trach collar to allow pt to leave room this week with therapy and to use during bathroom transfers with nursing. With visual demonstration, RT assisted OT with creating trach compatable portable 02. Sign placed on wall above device regarding 02 allotment per RT  instruction. Nursing also made aware. Afterwards, pt reported needing to void. Pt transferred to toilet with portable 02 and use of STEDY lift. Pt able to complete hygiene but required assistance for clothing mgt. Pt was then returned to w/c per request. No c/o dizziness, BP 106/74. 02 sats stable. For remainder of session, pt completed AAROM exercises with L UE (1# dumbbell for bicep exercises only) x10 reps with pt exhibiting severe muscle weakness, 2+/5 MMT in most all planes. Currently awaiting MD order for rehab protocol for R UE. At end of session pt was left in w/c with safety belt and all needs within reach.   Therapy Documentation Precautions:  Precautions Precautions: Shoulder, Other (comment), Fall Type of Shoulder Precautions: Hx of R-RTC repair 08/14/16 Shoulder Interventions: Shoulder sling/immobilizer Precaution Comments: trach  Restrictions Weight Bearing Restrictions: No General:   Vital Signs: Therapy Vitals Temp: 98.2 F (36.8 C) Temp Source: Oral Pulse Rate: 82 Resp: 18 BP: 103/70 Patient Position (if appropriate): Sitting Oxygen Therapy SpO2: 98 % O2 Device: Tracheostomy Collar O2 Flow Rate (L/min): 6 L/min FiO2 (%): 28 % Pain: No c/o pain during session  Pain Assessment Pain Assessment: Faces Faces Pain Scale: No hurt Pain Location: Generalized Pain Frequency: Constant Pain Onset: On-going Pain Intervention(s): Medication (See eMAR) ADL: ADL ADL Comments: See Functional Assessment Tool Exercises:AAROM x10 reps: shoulder flexion, shoulder abduction, tricep punches, overhead punches L UE therapeutic exercise with 1# dumbbell: bicep curls, body cross curls    Other Treatments:    See Function Navigator for Current Functional Status.   Therapy/Group: Individual Therapy  Tiasha Helvie A  Zuri Bradway 09/17/2016, 4:31 PM

## 2016-09-17 NOTE — Progress Notes (Addendum)
Speech Language Pathology Daily Session Note  Patient Details  Name: Bradley [name] MRN: [id number] Date of Birth: 11-Apr-1961  Today's Date: 09/17/2016 SLP Individual Time: 0930-1015 SLP Individual Time Calculation (min): 45 min   Short Term Goals: Week 1: SLP Short Term Goal 1 (Week 1): Pt will return demonstration of donning and doffing speaking valve with supervision verbal cues.  SLP Short Term Goal 2 (Week 1): Pt will wear speaking valve with no changes in vitals or reports of discomfort for 60 minutes.   SLP Short Term Goal 3 (Week 1): Pt will consume trials of dys 3 textures with mod I use of swallowing precautions over 2 consecutive sessions prior to advancement.   SLP Short Term Goal 4 (Week 1): Pt will verbalize safety precautions for valve use with supervision question cues.  SLP Short Term Goal 5 (Week 1): Pt will complete semi-complex problem solving tasks with Min A verbal cues and 80% accuracy.  SLP Short Term Goal 6 (Week 1): Pt will utilize external memory aids to recall new, daily information with Min A verbal cues.    Skilled Therapeutic Interventions: Skilled treatment session focused on assessment of pt's cognition. When SLP arrived to room, RT was also arriving to room. Pt was not wearing PMV at the time. Pt drinking via straw without valve in place. Pt without overt s/s of aspiration. RT provided immediate education on wearing PMV while drinking. Nursing requested RT d/t pt feeling as if you couldn't breath after coughing. Cuff was deflated further by RT and request made for MD to order cuflless trach d/t possible inflating when pt coughed. Pt didn't experience any difficulty with respiratory status or toleration of PMV during session. ST attempted to administer Cognitive Linguistic Quick Test to assess pt's cognition. Pt with almost immediate decreased toleration/increased frustration with task. Pt with significant difficulty retaining information related to clock drawing  and story retelling. ST switched assessment to MoCA (version 7.1) for shorted length of test. PT received score of 15/30 with average score greater than 26.  Pt with difficulty in the areas of problem solving, sustained attention to task and memory. Education provided to pt and son (who was present at end of session). Goals added to POC for cognition. Pt left with PMV in place, in his chair with son present and all needs within reach. Continue current plan of care.   Function:    Cognition Comprehension Comprehension assist level: Follows basic conversation/direction with no assist  Expression Expression assistive device: Talk trach valve Expression assist level: Expresses basic needs/ideas: With extra time/assistive device  Social Interaction Social Interaction assist level: Interacts appropriately with others with medication or extra time (anti-anxiety, antidepressant).  Problem Solving Problem solving assist level: Solves basic 50 - 74% of the time/requires cueing 25 - 49% of the time  Memory Memory assist level: Recognizes or recalls 50 - 74% of the time/requires cueing 25 - 49% of the time    Pain    Therapy/Group: Individual Therapy  Bradley [name] 09/17/2016, 1:22 PM

## 2016-09-17 NOTE — Progress Notes (Signed)
[place] PHYSICAL MEDICINE & REHABILITATION     PROGRESS NOTE    Subjective/Complaints: Slept well. Night uneventful.  ROS: Pt denies fever, rash/itching, headache, blurred or double vision, nausea, vomiting, abdominal pain, diarrhea, chest pain, shortness of breath, palpitations, dysuria, dizziness, neck pain, back pain, bleeding, anxiety, or depression   Objective: Vital Signs: Blood pressure 121/75, pulse 75, temperature 98.4 F (36.9 C), temperature source Oral, resp. rate 16, height 5\' 9"  (1.753 m), weight 71.7 kg (158 lb), SpO2 98 %. No results found.  Recent Labs  09/16/16 0440  WBC 11.4*  HGB 13.2  HCT 39.3  PLT 160    Recent Labs  09/16/16 0440  NA 138  K 3.3*  CL 102  GLUCOSE 101*  BUN 10  CREATININE 0.48*  CALCIUM 8.7*   CBG (last 3)   Recent Labs  09/16/16 1659 09/16/16 2107 09/17/16 0648  GLUCAP 153* 135* 100*    Wt Readings from Last 3 Encounters:  09/17/16 71.7 kg (158 lb)  09/14/16 79.5 kg (175 lb 3.2 oz)  08/14/16 90.7 kg (200 lb)    Physical Exam:  Constitutional: No distress   .  HENT:   Head: [place]/NT.  Mouth/Throat: Oropharynx is clear and moist.  Eyes: Conjunctivaeare normal. Pupils are equal, round, and reactive to light. Right eye exhibits no discharge.  Neck: Normal range of motion.  Cuffed # 6 trach in place with PMV. Cardiovascular: RRR  Respiratory: CTA b GI: BS+ NT/ND Musculoskeletal: He exhibits no edemaor tenderness.  Right shoulder in sling Neurological: He is alertand oriented to person, place, and time. No cranial nerve deficit.  STM deficits.    RUE limited by sling--4/5 HI. LUE: 3/5 deltoid, bicep, tricep and 4/5 wrist and HI. LE: 3/ HF, KE and 4/5 ADF/PF.---no change today.  No sensory deficits consistently present Skin: Skin is warmand dry. He is not diaphoretic.  Psychiatric: pleasant. cooperative   Assessment/Plan: 1. Functional deficits secondary to debility and anoxic encephalopathy which  require 3+ hours per day of interdisciplinary therapy in a comprehensive inpatient rehab setting. Physiatrist is providing close team supervision and 24 hour management of active medical problems listed below. Physiatrist and rehab team continue to assess barriers to discharge/monitor patient progress toward functional and medical goals.  Function:  Bathing Bathing position      Bathing parts      Bathing assist        Upper Body Dressing/Undressing Upper body dressing                    Upper body assist        Lower Body Dressing/Undressing Lower body dressing                                  Lower body assist        Toileting Toileting          Toileting assist     Transfers Chair/bed transfer   Chair/bed transfer method: Stand pivot Chair/bed transfer assist level: Maximal assist (Pt 25 - 49%/lift and lower)       Locomotion Ambulation Ambulation activity did not occur: Safety/medical concerns         Wheelchair   Type: Manual Max wheelchair distance: 150 Assist Level: Moderate assistance (Pt 50 - 74%)  Cognition Comprehension Comprehension assist level: Follows basic conversation/direction with no assist  Expression Expression assist level: Expresses basic needs/ideas: With extra time/assistive  device  Social Interaction Social Interaction assist level: Interacts appropriately with others with medication or extra time (anti-anxiety, antidepressant).  Problem Solving Problem solving assist level: Solves basic 75 - 89% of the time/requires cueing 10 - 24% of the time  Memory Memory assist level: Recognizes or recalls 75 - 89% of the time/requires cueing 10 - 24% of the time   Medical Problem List and Plan: 1. Functional, mobility and cognitive deficits secondary to debility/?anoxic encephalopathy -continue CIR therapies 2. DVT Prophylaxis/Anticoagulation: Pharmaceutical: Lovenox 3. Pain Management: Added low dose  Neurontin to help with neuropathy BUE.   -used ibuprofen daily for myalgias at home.  4. Mood: Team to provide ego support. LCSW to follow for evaluation and support.  5. Neuropsych: This patient is not fully capable of making decisions on hisown behalf. 6. Skin/Wound Care: routine trach care. Continue airmatress overlay for pressure relief measures.  7. Fluids/Electrolytes/Nutrition: push po    -K+ 3.3   -repleting K+ 8. VDRF: Cuffed #6 trach in place. No secretions noted and tolerating PMSV with 40% ATC very well.  -?downsize this week  9. COPD with IPF: On prednisone 20 mg daily with  10. Anxiety:   -now on celexa  -added xanax prn for breakthrough symptoms.     11. Hyperglycemia: Likely due to illness and steroids.  check Hgb A1C?  -Continue Lantus for now---consider weaning although on daily prednisone for COPD  -. Will monitor BS ac/hs and use SSI as well.   LOS (Days) 2 A FACE TO FACE EVALUATION WAS PERFORMED  SWARTZ,ZACHARY T 09/17/2016 8:57 AM

## 2016-09-17 NOTE — Progress Notes (Signed)
09/17/16 1030 nursing RT recommend cuffless trach for pt. MD notified.Dr. Noland FordyceScwartz said lets keep trach for now. They will consult with [facility]

## 2016-09-18 ENCOUNTER — Inpatient Hospital Stay (HOSPITAL_COMMUNITY): Payer: BLUE CROSS/BLUE SHIELD | Admitting: Occupational Therapy

## 2016-09-18 ENCOUNTER — Encounter (HOSPITAL_COMMUNITY): Payer: Self-pay | Admitting: Physical Medicine & Rehabilitation

## 2016-09-18 ENCOUNTER — Inpatient Hospital Stay (HOSPITAL_COMMUNITY): Payer: BLUE CROSS/BLUE SHIELD | Admitting: Physical Therapy

## 2016-09-18 ENCOUNTER — Inpatient Hospital Stay (HOSPITAL_COMMUNITY): Payer: BLUE CROSS/BLUE SHIELD | Admitting: Speech Pathology

## 2016-09-18 DIAGNOSIS — E876 Hypokalemia: Secondary | ICD-10-CM

## 2016-09-18 DIAGNOSIS — E119 Type 2 diabetes mellitus without complications: Secondary | ICD-10-CM

## 2016-09-18 DIAGNOSIS — D72829 Elevated white blood cell count, unspecified: Secondary | ICD-10-CM

## 2016-09-18 DIAGNOSIS — F411 Generalized anxiety disorder: Secondary | ICD-10-CM

## 2016-09-18 LAB — GLUCOSE, CAPILLARY
GLUCOSE-CAPILLARY: 136 mg/dL — AB (ref 65–99)
Glucose-Capillary: 96 mg/dL (ref 65–99)
Glucose-Capillary: 175 mg/dL — ABNORMAL HIGH (ref 65–99)
Glucose-Capillary: 158 mg/dL — ABNORMAL HIGH (ref 65–99)

## 2016-09-18 NOTE — Progress Notes (Signed)
Pt was noted sitting on the floor in his room at 0340 when bed alarm went off. He said that he was asleep and slid out from the foot of the bed. The rails were up. Patient was rounded on about 15 minutes before the fall. He was toileted, trach care done,  ginger ale was given per patient requests at that time. He was situated in bed with bed alarm on "turn on" mode. On assessment, he denies hitting head on the floor or any other object. No acute injury noted. He is alert and oriented X4, able to move his extremities, no change from baseline. Dr. On call was notified, recommended that we keep eye on him until the see him in AM. An attempt was made to reach his wife but was unsuccessful, message left for her to call the unit back.

## 2016-09-18 NOTE — Progress Notes (Signed)
Physical Therapy Session Note  Patient Details  Name: Bradley [name] MRN: [id number] Date of Birth: 08/20/1961  Today's Date: 09/18/2016 PT Individual Time: 1530-1630 PT Individual Time Calculation (min): 60 min    Short Term Goals: Week 1:  PT Short Term Goal 1 (Week 1): Patient will be able to perform sit to stand with min A and maintain standing for up to 1 min w/o BP changes PT Short Term Goal 2 (Week 1): Patient will be able to perform transfer bed to w/c with min A and no LOB PT Short Term Goal 3 (Week 1): Patient will be able to complete gait training on adistance of 50 feet with LRAD with min A PT Short Term Goal 4 (Week 1): Patient will be able to navigate up and down 3 steps with one L side rail with mod A  Skilled Therapeutic Interventions/Progress Updates:    Pt resting in recliner on arrival, no c/o pain, and agreeable to therapy session.  Session focus on activity tolerance, strengthening, and gait training.  Pt transferred recliner>w/c pulling up on stedy at start of session with min assist.  W/C propulsion x150' with supervision for UE strengthening and cardiopulmonary endurance.  Sit<>stand x4 in // bars with supervision, pt pushed with LUE and pulled with RUE to stand.  Gait training 2x10' in // bars with rest break in between, overall steady assist for balance.  PT instructed pt in minisquats 2x10 and heel raises x10 for strengthening.  Pt requesting to be weighed on standing scale, sit<>stand with min assist to power up.  Pt returned to room at end of session and able to perform squat/pivot with steady assist from w/c>recliner using arm rests and verbal cues for turning.  Pt positioned to comfort in recliner with call bell in reach and needs met.  RT in room as PT exiting.   Therapy Documentation Precautions:  Precautions Precautions: Shoulder, Other (comment), Fall Type of Shoulder Precautions: Hx of R-RTC repair 08/14/16 Shoulder Interventions: Shoulder  sling/immobilizer Precaution Comments: trach  Restrictions Weight Bearing Restrictions: No   See Function Navigator for Current Functional Status.   Therapy/Group: Individual Therapy  Earnest Conroy Penven-Crew 09/18/2016, 4:31 PM

## 2016-09-18 NOTE — Progress Notes (Signed)
Social Work Assessment and Plan Social Work Assessment and Plan  Patient Details  Name: Bradley [name] MRN: [id number] Date of Birth: December 28, 1960  Today's Date: 09/18/2016  Problem List:  Patient Active Problem List   Diagnosis Date Noted  . Hypokalemia   . Generalized anxiety disorder   . Leukocytosis   . Diabetes mellitus type 2 without retinopathy (HCC)   . Debility 09/15/2016  . Anoxic encephalopathy (HCC)   . Tracheostomy status (HCC)   . ARDS (adult respiratory distress syndrome) (HCC)   . Acute respiratory failure with hypoxia (HCC) 08/18/2016  . Community acquired pneumonia 08/18/2016  . COPD exacerbation (HCC) 08/18/2016  . Depression 08/18/2016  . Sepsis (HCC) 08/18/2016  . Complete tear of right rotator cuff 08/14/2016  . Right rotator cuff tear 08/14/2016  . Cigarette smoker 07/23/2015  . Obesity 07/23/2015  . Pulmonary fibrosis (HCC) 07/22/2015   Past Medical History:  Past Medical History:  Diagnosis Date  . Anxiety    Citalopram controls "panic attacks"  . Asthma   . COPD (chronic obstructive pulmonary disease) (HCC)   . Diabetes mellitus type 2 without retinopathy (HCC)   . Dyspnea    with exertion  . History of kidney stones    x3-4 - lithotripsy  . Pneumonia    08/11/16- 3 years ago  . Pulmonary fibrosis (HCC)    Past Surgical History:  Past Surgical History:  Procedure Laterality Date  . COLONOSCOPY W/ POLYPECTOMY    . LITHOTRIPSY    . MULTIPLE TOOTH EXTRACTIONS     19 teeth extracted at once  . SHOULDER ARTHROSCOPY WITH ROTATOR CUFF REPAIR Right 08/14/2016   Procedure: RIGHT SHOULDER ARTHROSCOPY,DEBRIDEMENT, OPEN ROTATOR CUFF REPAIR;  Surgeon: Eldred MangesMark C Yates, MD;  Location: [facility] OR;  Service: Orthopedics;  Laterality: Right;  . TONSILLECTOMY     Social History:  reports that he has been smoking Cigarettes.  He has a 30.00 pack-year smoking history. He has never used smokeless tobacco. He reports that he does not drink alcohol or use  drugs.  Family / Support Systems Marital Status: Married How Long?: 18 years Patient Roles: Spouse, Parent, Other (Comment) (employee) Spouse/Significant Other: [name] [phone]-cell Children: Two children-daughter and son in [place], wife has a daughter also Other Supports: Sister in-law who is here today and extended family Anticipated Caregiver: Wife and family members Ability/Limitations of Caregiver: Wife is disabled with fibromaylagia, but other family members will step up Caregiver Availability: 24/7 Family Dynamics: Close knit family who will assist one another. Sister in-law comes when pt's wife can't due to her health issues. They have all pulled closer with this happening to pt and realize how fragile life is. Pt is grateful to have them.  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   Abuse/Neglect Physical Abuse: Denies Verbal Abuse: Denies Sexual Abuse: Denies Exploitation of patient/patient's resources: Denies Self-Neglect: Denies  Emotional Status Pt's affect, behavior adn adjustment status: Pt is motivated but somwhat limiting due to doesn't want to push himself too hard. Discussed need to participate in therapies and  is here to recover from this. He feels he needs rest breaks in between therapies which is something they can do. He will need time to adjust to the rehab program but will do well here. Recent Psychosocial Issues: recent shoulder  surgery-10/16 then this happened Pyschiatric History: History of anxiety takes medications for this, but feels his anxiety is worse now this has happened. He is trying alternative realization techniques. He would benefit from seeing neuro-psych while here. He is able to verbalize and is open with his feelings. Substance Abuse History: Tobacco has quit now he realizes he has damaged his lungs and needs to quit for his lung sake. Will discuss while here if needs community resources once discharged.  Patient / Family Perceptions, Expectations & Goals Pt/Family understanding of illness & functional limitations: Pt and sister in-law can explain his health issues-what has happened while here. Between wife, pt and sister inlaw they talk with the MD and feel they have a good understanding of his treatment plan. Premorbid pt/family roles/activities: Husband, father, employee, friend, brother in-law, home owner, etc Anticipated changes in roles/activities/participation: resume Pt/family expectations/goals: Pt states: " I want to do for myself but not be pushed too hard, I am winded."  Sister in-law states: " I hope he does well here, he can do it, but need to calm down."  [employer] IncCommunity Resources Community Agencies: None Premorbid Home Care/DME Agencies: Other (Comment) (OP for shoulder surgery) Transportation available at discharge: Family Resource referrals recommended: Neuropsychology, Support group (specify)  Discharge Planning Living Arrangements: Spouse/significant other Support Systems: Spouse/significant other, Children, Other relatives, Friends/neighbors, Church/faith community Type of Residence: Private residence Insurance Resources: Media plannerrivate Insurance (specify) Herbalist(BCBS) Financial Resources: Employment Financial Screen Referred: No Living Expenses: Database administratorMotgage Money Management: Patient, Spouse Does the patient have any problems obtaining your medications?: No Home Management: Wife does and what she  can't due to health issues the pt does.  Patient/Family Preliminary Plans: Plans to go to sister in-law's home until their home is ready. This way other family members will be there if he needs more assist than wife can provide. She can do supervision-light min assist but not much more due to her own health issues. This is the reason sister in-law comes when wife can't. Will await team's evaluations and work on a safe discharge plan. Social Work Anticipated Follow Up Needs: HH/OP, Support Group  Clinical Impression Pleasant anxious male who is motivated to improve but doesn't want to be pushed too hard. He is requesting rest breaks in between therapies, feels at times it is too much. He is till adjusting to rehab and will need to be eased into the program. He also has his shoulder he is focusing on and the pain he is having in it, from his recent surgery. His family is very supportive and here daily which helps his anxiety. His wife has health limitations which he realizes she can not help him and he Will need to do for himself at all possible. He would benefit from neuro-psych will make referral and provide support while here. His BCBS is unlikely to cover NH if pt doesn't reach the level to go home, made aware of this.  Lucy Chrisupree, Analisia Kingsford G 09/18/2016, 11:45 AM

## 2016-09-18 NOTE — Progress Notes (Signed)
Occupational Therapy Session Note  Patient Details  Name: Bradley [name] MRN: [id number] Date of Birth: 1961/09/09  Today's Date: 09/18/2016 OT Individual Time: 1015-1100 OT Individual Time Calculation (min): 45 min  and Today's Date: 09/18/2016 OT Missed Time: 15 Minutes Missed Time Reason: Patient ill (comment) (severe dizziness)     Short Term Goals:Week 1:  OT Short Term Goal 1 (Week 1): Complete toilet transfer with min assist OT Short Term Goal 2 (Week 1): Complete upper body bathing seated at EOB or sink, with mod assist OT Short Term Goal 3 (Week 1): Complete lower body bathing with mod assist using AE, prn OT Short Term Goal 4 (Week 1): Groom seated with mod assist OT Short Term Goal 5 (Week 1): Maintain supported standing balance for 2 minutes during assisted dressing  Skilled Therapeutic Interventions/Progress Updates:    Pt missed first 15 min of therapy as he was very dizzy and anxious.  RN provided pain and anxiety medication and pt continuing with anxiety stating he could not participate. Pt agreed to sit and relax and therapy would start later so medicine could take effect. Pt very self limiting stating he would need total help and he couldn't stand up well by himself.  Pt encouraged to do what he could.  With the steady lift, pt stood from low recliner with min A and then stood from elevated toilet seat 4x (for toileting, bathing, dressing from toilet) with supervision only to the steady.  Able to stand fully upright.  He participated well and was able to complete self care with mod A.  O2 sats at 100% on O2 and HR at 95.  Attempted to have pt realized his progress with mobility over the last 2 days, but pt continued to be focused on his many "frustrations" with being hospitalized.  He was calm by the end of the session and positioned pt in recliner to allow him to nap as he stated he only had one hour of sleep.  Pt with quick release belt on and call light in reach.    Therapy Documentation Precautions:  Precautions Precautions: Shoulder, Other (comment), Fall Type of Shoulder Precautions: Hx of R-RTC repair 08/14/16 Shoulder Interventions: Shoulder sling/immobilizer Precaution Comments: trach  Restrictions Weight Bearing Restrictions: No    Vital Signs: Therapy Vitals Temp: 97.6 F (36.4 C) Temp Source: Oral Pulse Rate: 84 Resp: 18 BP: 91/67 Patient Position (if appropriate): Sitting Oxygen Therapy SpO2: 99 % O2 Device: Tracheostomy Collar O2 Flow Rate (L/min): 6 L/min FiO2 (%): 28 % Pain: no c/o pain but has 8/10 nausea , nursing provided nausea and anxiety medication   ADL: ADL ADL Comments: See Functional Assessment Tool  See Function Navigator for Current Functional Status.   Therapy/Group: Individual Therapy  [name] 09/18/2016, 10:21 AM

## 2016-09-18 NOTE — IPOC Note (Signed)
balanceOverall Plan of Care [facility](IPOC) Patient Details Name: Bradley [name] MRN: [id number] DOB: 03-07-1961  Admitting Diagnosis: Debility withTrach  Hospital Problems: Principal Problem:   Debility Active Problems:   Pulmonary fibrosis (HCC)   Complete tear of right rotator cuff   Tracheostomy status (HCC)   Anoxic encephalopathy (HCC)   Hypokalemia   Generalized anxiety disorder   Leukocytosis   Diabetes mellitus type 2 without retinopathy (HCC)     Functional Problem List: Nursing Behavior, Bladder, Bowel, Endurance, Medication Management, Nutrition, Pain, Safety, Skin Integrity  PT Balance, Endurance, Motor, Pain, Safety  OT Balance, Behavior, Endurance, Pain, Safety, Vision  SLP Nutrition, Linguistic, Safety  TR         Basic ADL's: OT Eating, Grooming, Bathing, Dressing, Toileting     Advanced  ADL's: OT       Transfers: PT Bed Mobility, Bed to Chair, Customer service managerCar  OT Toilet, Tub/Shower     Locomotion: PT Ambulation, Psychologist, prison and probation servicesWheelchair Mobility, Stairs     Additional Impairments: OT Fuctional Use of Upper Extremity  SLP Swallowing, Communication expression    TR      Anticipated Outcomes Item Anticipated Outcome  Self Feeding Supervision and setup  Swallowing  mod I    Basic self-care  Min A  Toileting  Min A   Bathroom Transfers Min A  Bowel/Bladder  Patient will be continent of bowel and bladder with min assist  Transfers  Mod I   Locomotion  Mod I with LRAD or no device  Communication  mod I   Cognition     Pain  Ppain will be less than or equal to 4 on a scle of 1=10  Safety/Judgment  Patient will be free from falls/injury and displaying sound safety judgement   Therapy Plan: PT Intensity: Minimum of 1-2 x/day ,45 to 90 minutes PT Frequency: 5 out of 7 days PT Duration Estimated Length of Stay: 10-12 days OT Intensity: Minimum of 1-2 x/day, 45 to 90 minutes OT Frequency: 5 out of 7 days OT Duration/Estimated Length of Stay: 14-21 days SLP  Intensity: Minumum of 1-2 x/day, 30 to 90 minutes SLP Frequency: 3 to 5 out of 7 days SLP Duration/Estimated Length of Stay: 14-21 days        Team Interventions: Nursing Interventions Patient/Family Education, Bladder Management, Bowel Management, Disease Management/Prevention, Pain Management, Medication Management, Skin Care/Wound Management, Cognitive Remediation/Compensation, Discharge Planning  PT interventions Ambulation/gait training, Neuromuscular re-education, Stair training, UE/LE Strength taining/ROM, Wheelchair propulsion/positioning, Therapeutic Activities, Warden/rangerBalance/vestibular training, Functional mobility training, Patient/family education, Therapeutic Exercise  OT Interventions Balance/vestibular training, Discharge planning, Disease mangement/prevention, Functional mobility training, Pain management, Patient/family education, Therapeutic Activities, Therapeutic Exercise, UE/LE Strength taining/ROM, UE/LE Coordination activities  SLP Interventions Cueing hierarchy, Dysphagia/aspiration precaution training, Environmental controls, Internal/external aids, Patient/family education  TR Interventions    SW/CM Interventions Discharge Planning, Psychosocial Support, Patient/Family Education    Team Discharge Planning: Destination: PT-Home ,OT- Home , SLP-Home Projected Follow-up: PT-Home health PT, OT-  Outpatient OT, SLP-Outpatient SLP, 24 hour supervision/assistance, Home Health SLP Projected Equipment Needs: PT-To be determined, OT- To be determined, SLP-To be determined Equipment Details: PT- , OT-  Patient/family involved in discharge planning: PT- Family member/caregiver,  OT-Patient, Family member/caregiver, SLP-Patient, Family member/caregiver  MD ELOS: 10-15 days. Medical Rehab Prognosis:  Good  Assessment: 55 year old male with h/o of COPD, tobacco use, interstitial pulmonary fibrosis, DOE, anxiety disorder, right shoulder arthroscopy 08/14/16 by Dr. Ophelia CharterYates who developed  progressive SOB with productive cough and was admitted on 08/16/16 with  acute hypoxic/hypercarbic respiratory failure due to IPF v/sp post ALI v/s HCAP. He was treated with broad spectrum antibiotics and steroids but progressed to VDRF requiring tracheostomy 11/6. He continued to require vent support for rest. Hospital course signifiant for encephalopathy and  poor activity tolerance. [facility] 09/08/16.   He has been weaned off the vent and tolerating PMSV during the day. PCCM recommends continue valve during thd day and capping trials once a little stronger. He is tolerating dysphagia II, thin liquids. Mentation improved, but he continues to have high levels of anxiety which is worse at nights. Pt with functional deficits with endurance, weakness. Will set goals for Min A with OT, Mod I with PT/SLP.  See Team Conference Notes for weekly updates to the plan of care

## 2016-09-18 NOTE — Progress Notes (Signed)
Speech Language Pathology Daily Session Note  Patient Details  Name: Bradley [name] MRN: [id number] Date of Birth: Oct 16, 1961  Today's Date: 09/18/2016 SLP Individual Time: 0903-1000 SLP Individual Time Calculation (min): 57 min   Short Term Goals:Week 1: SLP Short Term Goal 1 (Week 1): Pt will return demonstration of donning and doffing speaking valve with supervision verbal cues.  SLP Short Term Goal 2 (Week 1): Pt will wear speaking valve with no changes in vitals or reports of discomfort for 60 minutes.   SLP Short Term Goal 3 (Week 1): Pt will consume trials of dys 3 textures with mod I use of swallowing precautions over 2 consecutive sessions prior to advancement.   SLP Short Term Goal 4 (Week 1): Pt will verbalize safety precautions for valve use with supervision question cues.  SLP Short Term Goal 5 (Week 1): Pt will complete semi-complex problem solving tasks with Min A verbal cues and 80% accuracy.  SLP Short Term Goal 6 (Week 1): Pt will utilize external memory aids to recall new, daily information with Min A verbal cues.   Skilled Therapeutic Interventions:  Pt was seen for skilled ST targeting cognitive goals.  Pt had dislodged inner cannula prior to therapist's arrival.  He reported that cannula came out with coughing.  RN made aware.  Inner cannula was cleaned and replaced by RN.  Pt was noted with increased agitation and poor frustration tolerance regarding length of stay and goals of therapy but was easily redirected to structured tasks with careful explanation of rationale for inpatient rehab services.  Therapist facilitated the session with a money management task targeting functional problem solving.  Pt needed mod assist verbal cues for working memory and task organization to count money and make change for 90% accuracy.  Pt was left in recliner and handed off to OT at the end of today's therapy session.  Continue per current plan of care.       Function:  Eating Eating                  Cognition Comprehension Comprehension assist level: Follows basic conversation/direction with no assist  Expression Expression assistive device: Talk trach valve Expression assist level: Expresses basic needs/ideas: With extra time/assistive device  Social Interaction Social Interaction assist level: Interacts appropriately 75 - 89% of the time - Needs redirection for appropriate language or to initiate interaction.  Problem Solving Problem solving assist level: Solves basic 50 - 74% of the time/requires cueing 25 - 49% of the time  Memory Memory assist level: Recognizes or recalls 50 - 74% of the time/requires cueing 25 - 49% of the time    Pain Pain Assessment Pain Assessment: No/denies pain  Therapy/Group: Individual Therapy  Laurita Peron, Melanee SpryNicole L 09/18/2016, 12:25 PM

## 2016-09-18 NOTE — Care Management Note (Signed)
[facility]patient Rehabilitation [facility] Individual Statement of Services  Patient Name:  Bradley [name]  Date:  09/18/2016  Welcome to the [facility]patient [facility].  Our goal is to provide you with an individualized program based on your diagnosis and situation, designed to meet your specific needs.  With this comprehensive rehabilitation program, you will be expected to participate in at least 3 hours of rehabilitation therapies Monday-Friday, with modified therapy programming on the weekends.  Your rehabilitation program will include the following services:  Physical Therapy (PT), Occupational Therapy (OT), Speech Therapy (ST), 24 hour per day rehabilitation nursing, Therapeutic Recreaction (TR), Neuropsychology, Case Management (Social Worker), Rehabilitation Medicine, Nutrition Services and Pharmacy Services  Weekly team conferences will be held on Wednesday to discuss your progress.  Your Social Worker will talk with you frequently to get your input and to update you on team discussions.  Team conferences with you and your family in attendance may also be held.  Expected length of stay: 12-20 days  Overall anticipated outcome: mod/i-min with bathing and dressing due to recent shoulder surgery  Depending on your progress and recovery, your program may change. Your Social Worker will coordinate services and will keep you informed of any changes. Your Social Worker's name and contact numbers are listed  below.  The following services may also be recommended but are not provided by the Inpatient Rehabilitation Center:   Driving Evaluations  Home Health Rehabiltiation Services  Outpatient Rehabilitation Services  Vocational Rehabilitation   Arrangements will be made to provide these services after discharge if needed.  Arrangements include referral to agencies that provide these services.  Your insurance has been verified to be:  BCBS Your primary doctor is:  Nurse, mental healthKarrar Husain  Pertinent  information will be shared with your doctor and your insurance company.  Social Worker:  Dossie DerBecky Parks Czajkowski, SW [phone] or ([phone]) [phone]  Information discussed with and copy given to patient by: Lucy Chrisupree, Maeci Kalbfleisch G, 09/18/2016, 11:24 AM

## 2016-09-18 NOTE — Progress Notes (Signed)
Patient information reviewed and entered into eRehab system by Latalia Etzler, RN, CRRN, PPS Coordinator.  Information including medical coding and functional independence measure will be reviewed and updated through discharge.    

## 2016-09-18 NOTE — Progress Notes (Signed)
Speech Language Pathology Daily Session Note  Patient Details  Name: Bradley [name] MRN: [id number] Date of Birth: 03-13-1961  Today's Date: 09/18/2016 SLP Individual Time: 1500-1530 SLP Individual Time Calculation (min): 30 min   Short Term Goals: Week 1: SLP Short Term Goal 1 (Week 1): Pt will return demonstration of donning and doffing speaking valve with supervision verbal cues.  SLP Short Term Goal 2 (Week 1): Pt will wear speaking valve with no changes in vitals or reports of discomfort for 60 minutes.   SLP Short Term Goal 3 (Week 1): Pt will consume trials of dys 3 textures with mod I use of swallowing precautions over 2 consecutive sessions prior to advancement.   SLP Short Term Goal 4 (Week 1): Pt will verbalize safety precautions for valve use with supervision question cues.  SLP Short Term Goal 5 (Week 1): Pt will complete semi-complex problem solving tasks with Min A verbal cues and 80% accuracy.  SLP Short Term Goal 6 (Week 1): Pt will utilize external memory aids to recall new, daily information with Min A verbal cues.   Skilled Therapeutic Interventions:   Skilled treatment session focused on addressing dysphagia and PMSV goals. SLP facilitated session by providing complete deflation of cuff at initiation of session with removal of about ~1.25 cc of air.  PMSV in place and patient tolerating well.  Patient declined Dys.3 texture due to just having finished lunch, but wanted to show SLP how he could consume small sips from the straw well, which he did in most trials; however, with an instance of a large bolus resulted in a reflexive cough and following patient reported that he was having difficulty breathing.  PMSV was removed and cuff was again partially inflated.  3+cc of air removed and cuff fully deflated again.  RN notified and reported that he had fully deflated cuff prior to my session; as a result PA was also notified.  Patient informed to remove PMSV if feeling like it is  difficult to breath and he verbalized understanding and returned demonstration of removal with Supervision question cue x1.  Continue to recommend trach changed to cuffless.    Function:  Eating Eating   Modified Consistency Diet: Yes Eating Assist Level: Supervision or verbal cues           Cognition Comprehension Comprehension assist level: Follows basic conversation/direction with no assist  Expression Expression assistive device: Talk trach valve Expression assist level: Expresses basic needs/ideas: With extra time/assistive device  Social Interaction Social Interaction assist level: Interacts appropriately 75 - 89% of the time - Needs redirection for appropriate language or to initiate interaction.  Problem Solving Problem solving assist level: Solves basic 50 - 74% of the time/requires cueing 25 - 49% of the time  Memory Memory assist level: Recognizes or recalls 50 - 74% of the time/requires cueing 25 - 49% of the time    Pain Pain Assessment Pain Assessment: No/denies pain  Therapy/Group: Individual Therapy  Charlane FerrettiMelissa Duel Conrad, M.A., CCC-SLP [phone]  Bradley [name] 09/18/2016, 3:34 PM

## 2016-09-18 NOTE — Progress Notes (Signed)
[place] PHYSICAL MEDICINE & REHABILITATION     PROGRESS NOTE    Subjective/Complaints: Pt sitting up in his chair, stating he can't breath (but able to converse) due to inflated trach cuff. He states he fell overnight on the floor from his bed, but did not injury anything.  He states he has trouble sleeping because of the noise and demands a different room.    ROS: +Anxiety. Denies CP, SOB, N/V/D.   Objective: Vital Signs: Blood pressure 91/67, pulse 84, temperature 97.6 F (36.4 C), temperature source Oral, resp. rate 18, height 5\' 9"  (1.753 m), weight 72 kg (158 lb 11.7 oz), SpO2 99 %. No results found.  Recent Labs  09/16/16 0440  WBC 11.4*  HGB 13.2  HCT 39.3  PLT 160    Recent Labs  09/16/16 0440  NA 138  K 3.3*  CL 102  GLUCOSE 101*  BUN 10  CREATININE 0.48*  CALCIUM 8.7*   CBG (last 3)   Recent Labs  09/17/16 1628 09/17/16 2033 09/18/16 0635  GLUCAP 197* 223* 96    Wt Readings from Last 3 Encounters:  09/18/16 72 kg (158 lb 11.7 oz)  09/14/16 79.5 kg (175 lb 3.2 oz)  08/14/16 90.7 kg (200 lb)    Physical Exam:  Constitutional: No distress. Vital signs reviewed.  HENT:  [place]. NT.  Eyes: EOMI. No discharge.  Neck: Normal range of motion.  Cuffed # 6 trach in place with PMV. Cardiovascular: RRR. No JVD. Respiratory: CTA b. Unlabored after cuff deflated GI: BS+ NT/ND Musculoskeletal: He exhibits no edemaor tenderness.  Neurological: He is alertand oriented.  Motor: 4/5 throughout, except right shoulder limited Skin: Skin is warmand dry. He is not diaphoretic.  Psychiatric: pleasant. cooperative   Assessment/Plan: 1. Functional deficits secondary to debility and anoxic encephalopathy which require 3+ hours per day of interdisciplinary therapy in a comprehensive inpatient rehab setting. Physiatrist is providing close team supervision and 24 hour management of active medical problems listed below. Physiatrist and rehab team continue to  assess barriers to discharge/monitor patient progress toward functional and medical goals.  Function:  Bathing Bathing position   Position: Wheelchair/chair at sink  Bathing parts Body parts bathed by patient: Right arm, Chest, Abdomen, Right upper leg, Left upper leg Body parts bathed by helper: Left arm, Front perineal area, Buttocks, Right lower leg, Left lower leg, Back  Bathing assist Assist Level: 2 helpers      Upper Body Dressing/Undressing Upper body dressing   What is the patient wearing?: Button up shirt         Button up shirt - Perfomed by patient: Thread/unthread right sleeve, Thread/unthread left sleeve Button up shirt - Perfomed by helper: Pull shirt around back, Button/unbutton shirt    Upper body assist Assist Level:  (Mod A)      Lower Body Dressing/Undressing Lower body dressing   What is the patient wearing?: Underwear, Non-skid slipper socks Underwear - Performed by patient: Thread/unthread right underwear leg, Thread/unthread left underwear leg Underwear - Performed by helper: Pull underwear up/down Pants- Performed by patient: Thread/unthread right pants leg, Thread/unthread left pants leg Pants- Performed by helper: Pull pants up/down   Non-skid slipper socks- Performed by helper: Don/doff right sock, Don/doff left sock                  Lower body assist Assist for lower body dressing: 2 Air cabin crewHelpers      Toileting Toileting          Toileting  assist     Transfers Chair/bed transfer   Chair/bed transfer method: Other Chair/bed transfer assist level: Maximal assist (Pt 25 - 49%/lift and lower) Chair/bed transfer assistive device: Mechanical lift Mechanical lift: Stedy   Locomotion Ambulation Ambulation activity did not occur: Safety/medical concerns         Wheelchair   Type: Manual Max wheelchair distance: 150 Assist Level: Moderate assistance (Pt 50 - 74%)  Cognition Comprehension Comprehension assist level: Follows basic  conversation/direction with no assist  Expression Expression assist level: Expresses basic needs/ideas: With extra time/assistive device  Social Interaction Social Interaction assist level: Interacts appropriately with others with medication or extra time (anti-anxiety, antidepressant).  Problem Solving Problem solving assist level: Solves basic 50 - 74% of the time/requires cueing 25 - 49% of the time  Memory Memory assist level: Recognizes or recalls 50 - 74% of the time/requires cueing 25 - 49% of the time   Medical Problem List and Plan: 1. Functional, mobility and cognitive deficits secondary to debility/?anoxic encephalopathy -continue CIR therapies 2. DVT Prophylaxis/Anticoagulation: Pharmaceutical: Lovenox 3. Pain Management: Added low dose Neurontin to help with neuropathy BUE.     used ibuprofen daily for myalgias at home.  4. Mood: Team to provide ego support. LCSW to follow for evaluation and support.  5. Neuropsych: This patient is not fully capable of making decisions on hisown behalf. 6. Skin/Wound Care: routine trach care. Continue airmatress overlay for pressure relief measures.  7. Fluids/Electrolytes/Nutrition: push po  K+ 3.3 11/18, repleting   Labs ordered for tomorrow 8. VDRF: Cuffed #6 trach in place. No secretions noted and tolerating PMSV with 40% ATC very well.  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 Anxiety:   -now on celexa  -xanax prn for breakthrough symptoms. 11. DM type 2: Likely due to illness and steroids.    Hgb A1C 6.9.  Continue Lantus   Will monitor BS ac/hs and use SSI as well.  12. Leukocytosis  Likely related to steroids  WBCs 11.4 11/18  Labs ordered for tomorrow  LOS (Days) 3 A FACE TO FACE EVALUATION WAS PERFORMED  Ankit Karis Jubanil Patel 09/18/2016 10:57 AM

## 2016-09-19 ENCOUNTER — Inpatient Hospital Stay (HOSPITAL_COMMUNITY): Payer: BLUE CROSS/BLUE SHIELD | Admitting: Physical Therapy

## 2016-09-19 ENCOUNTER — Telehealth (INDEPENDENT_AMBULATORY_CARE_PROVIDER_SITE_OTHER): Payer: Self-pay | Admitting: Orthopaedic Surgery

## 2016-09-19 ENCOUNTER — Inpatient Hospital Stay (HOSPITAL_COMMUNITY): Payer: BLUE CROSS/BLUE SHIELD | Admitting: Occupational Therapy

## 2016-09-19 ENCOUNTER — Inpatient Hospital Stay (HOSPITAL_COMMUNITY): Payer: BLUE CROSS/BLUE SHIELD | Admitting: Speech Pathology

## 2016-09-19 LAB — CBC WITH DIFFERENTIAL/PLATELET
BASOS ABS: 0 10*3/uL (ref 0.0–0.1)
Basophils Relative: 0 %
Eosinophils Absolute: 0.1 10*3/uL (ref 0.0–0.7)
Eosinophils Relative: 1 %
HEMATOCRIT: 40.3 % (ref 39.0–52.0)
Hemoglobin: 13.2 g/dL (ref 13.0–17.0)
LYMPHS PCT: 12 %
Lymphs Abs: 1.2 10*3/uL (ref 0.7–4.0)
MCH: 29.9 pg (ref 26.0–34.0)
MCHC: 32.8 g/dL (ref 30.0–36.0)
MCV: 91.4 fL (ref 78.0–100.0)
MONO ABS: 0.5 10*3/uL (ref 0.1–1.0)
Monocytes Relative: 5 %
NEUTROS ABS: 8.4 10*3/uL — AB (ref 1.7–7.7)
Neutrophils Relative %: 82 %
Platelets: 257 10*3/uL (ref 150–400)
RBC: 4.41 MIL/uL (ref 4.22–5.81)
RDW: 14.3 % (ref 11.5–15.5)
WBC: 10.3 10*3/uL (ref 4.0–10.5)

## 2016-09-19 LAB — BASIC METABOLIC PANEL
ANION GAP: 11 (ref 5–15)
BUN: 6 mg/dL (ref 6–20)
CO2: 24 mmol/L (ref 22–32)
Calcium: 8.8 mg/dL — ABNORMAL LOW (ref 8.9–10.3)
Chloride: 102 mmol/L (ref 101–111)
Creatinine, Ser: 0.54 mg/dL — ABNORMAL LOW (ref 0.61–1.24)
GFR calc Af Amer: >60 mL/min (ref >60)
GLUCOSE: 201 mg/dL — AB (ref 65–99)
POTASSIUM: 4 mmol/L (ref 3.5–5.1)
Sodium: 137 mmol/L (ref 135–145)

## 2016-09-19 LAB — GLUCOSE, CAPILLARY
GLUCOSE-CAPILLARY: 140 mg/dL — AB (ref 65–99)
GLUCOSE-CAPILLARY: 121 mg/dL — AB (ref 65–99)
Glucose-Capillary: 137 mg/dL — ABNORMAL HIGH (ref 65–99)
Glucose-Capillary: 180 mg/dL — ABNORMAL HIGH (ref 65–99)

## 2016-09-19 NOTE — Progress Notes (Signed)
Physical Therapy Session Note  Patient Details  Name: Bradley [name] MRN: [id number] Date of Birth: 01-08-1961  Today's Date: 09/19/2016 PT Individual Time: [id number] PT Individual Time Calculation (min): 31 min    Short Term Goals: Week 1:  PT Short Term Goal 1 (Week 1): Patient will be able to perform sit to stand with min A and maintain standing for up to 1 min w/o BP changes PT Short Term Goal 2 (Week 1): Patient will be able to perform transfer bed to w/c with min A and no LOB PT Short Term Goal 3 (Week 1): Patient will be able to complete gait training on adistance of 50 feet with LRAD with min A PT Short Term Goal 4 (Week 1): Patient will be able to navigate up and down 3 steps with one L side rail with mod A  Skilled Therapeutic Interventions/Progress Updates:    Pt received in handoff from PT; pt agreeable to tx & did not report any c/o pain during session. Pt reported need to use restroom & ambulated w/c<>toilet (10 ft + 10 ft) with RW & min assist. Pt completed sit<>stand from elevated toilet with steady assist, (+) continent void, perform peri-hygiene, and manage clothing with min assist for balance. Transported pt to rehab gym via w/c total assist for energy conservation as pt noted significant fatigue follow therapies today. Pt completed stand pivot w/c<>cybex kinetron with min/steady assist and cuing for hand placement; pt utilized kinetron in sitting for BLE strengthening in 3 bouts to pt fatigue. At end of session pt left sitting in w/c in room with QRB in place & all needs within reach. Educated pt on need to have full supervision when consuming food & drinks & pt voiced understanding.   Pt on room air throughout session & HR, SpO2 all remained WNL (SpO2 >94% throughout).  Therapy Documentation Precautions:  Precautions Precautions: Shoulder, Other (comment), Fall Type of Shoulder Precautions: Hx of R-RTC repair 08/14/16 Shoulder Interventions: Shoulder  sling/immobilizer Precaution Comments: trach  Restrictions Weight Bearing Restrictions: No   See Function Navigator for Current Functional Status.   Therapy/Group: Individual Therapy  Sandi MariscalVictoria M Kelty Szafran 09/19/2016, 4:35 PM

## 2016-09-19 NOTE — Progress Notes (Signed)
Speech Language Pathology Daily Session Note  Patient Details  Name: Ripley FraiseDavid M Stensland MRN: [id number] Date of Birth: 26-May-1961  Today's Date: 09/19/2016 SLP Individual Time: 0805-0905 SLP Individual Time Calculation (min): 60 min   Short Term Goals: Week 1: SLP Short Term Goal 1 (Week 1): Pt will return demonstration of donning and doffing speaking valve with supervision verbal cues.  SLP Short Term Goal 2 (Week 1): Pt will wear speaking valve with no changes in vitals or reports of discomfort for 60 minutes.   SLP Short Term Goal 3 (Week 1): Pt will consume trials of dys 3 textures with mod I use of swallowing precautions over 2 consecutive sessions prior to advancement.   SLP Short Term Goal 4 (Week 1): Pt will verbalize safety precautions for valve use with supervision question cues.  SLP Short Term Goal 5 (Week 1): Pt will complete semi-complex problem solving tasks with Min A verbal cues and 80% accuracy.  SLP Short Term Goal 6 (Week 1): Pt will utilize external memory aids to recall new, daily information with Min A verbal cues.   Skilled Therapeutic Interventions:  Pt was seen for skilled ST targeting speaking valve and dysphagia goals.  Pt reports that he is still having instances of his cuff re-inflating after coughing episodes.  Therapist removed between 2 and 3 ccs of air from cuff via syringe on 2 separate instances following cough.  Pt presented with questions regarding trach, anatomy, and speaking valve use.  Therapist provided skilled education regarding rationale for trach, indications for use of cuffed trach and the importance of deflating cuff completely prior to wearing speaking valve use.  Pt returned demonstration of how to don and doff valve with mod I x2 during therapy session.  Therapist educated pt on how to clean valve as well with pt able to demonstrate understanding via teach back with mod I.  All questions regarding trach and speaking valve answered to pt's satisfaction  at this time.  Therapist also facilitated the session with a trial snack of dys 3 textures with thin liquids to continue working towards diet progressions.  Pt consumed advanced textures with mod I use of swallowing precautions with complete clearance of residual solids from oral cavity post swallow.  No overt s/s of aspiration were evident with solids or liquids.  Recommend advancing pt to dys 3 textures, thin liquids.  Pt left in straight back chair with sister in law at bedside.  Continue per current plan of care.       Function:  Eating Eating   Modified Consistency Diet: Yes Eating Assist Level: Supervision or verbal cues           Cognition Comprehension Comprehension assist level: Understands complex 90% of the time/cues 10% of the time  Expression Expression assistive device: Talk trach valve Expression assist level: Expresses basic needs/ideas: With extra time/assistive device  Social Interaction Social Interaction assist level: Interacts appropriately 90% of the time - Needs monitoring or encouragement for participation or interaction.  Problem Solving Problem solving assist level: Solves basic 75 - 89% of the time/requires cueing 10 - 24% of the time  Memory Memory assist level: Recognizes or recalls 75 - 89% of the time/requires cueing 10 - 24% of the time    Pain Pain Assessment Pain Assessment: No/denies pain  Therapy/Group: Individual Therapy  Aidenjames Heckmann, Melanee SpryNicole L 09/19/2016, 12:23 PM

## 2016-09-19 NOTE — Progress Notes (Signed)
Per order, downsized  Trach from [place] cuffed trach to [place] 4 cuffless.  Good color change on CO2 detector.  Pt stable, no complications.

## 2016-09-19 NOTE — Progress Notes (Signed)
Occupational Therapy Session Note  Patient Details  Name: Bradley FraiseDavid M Bartleson MRN: [id number] Date of Birth: 07/02/1961  Today's Date: 09/19/2016 OT Individual Time: 1115-1200 OT Individual Time Calculation (min): 45 min     Short Term Goals: Week 1:  OT Short Term Goal 1 (Week 1): Complete toilet transfer with min assist OT Short Term Goal 2 (Week 1): Complete upper body bathing seated at EOB or sink, with mod assist OT Short Term Goal 3 (Week 1): Complete lower body bathing with mod assist using AE, prn OT Short Term Goal 4 (Week 1): Groom seated with mod assist OT Short Term Goal 5 (Week 1): Maintain supported standing balance for 2 minutes during assisted dressing  Skilled Therapeutic Interventions/Progress Updates:   1:1 Engaged in therapeutic activity to address activity tolerance, dynamic standing balance and functional mobility with introduction of RW. Preformed stand pivot transfers with RW with min A decr burden of care on caregiver. Functional ambulation short distances with RW with focus on widening BOS and activity tolerance; beginning with short distances and then extending to longer distances. Pt requested to weigh self today. Pt with difficutly with stepping up onto the scale today requiring mod A but able to standign with supervision for requested weight.   Pt declined bathing and dressing but did don shirt after session due to feeling hot.  PT required min A (for lifting over head). Left in room in prep for lunch.   Therapy Documentation Precautions:  Precautions Precautions: Shoulder, Other (comment), Fall Type of Shoulder Precautions: Hx of R-RTC repair 08/14/16 Shoulder Interventions: Shoulder sling/immobilizer Precaution Comments: trach  Restrictions Weight Bearing Restrictions: No Pain: No c/o pain in session  See Function Navigator for Current Functional Status.   Therapy/Group: Individual Therapy  Roney MansSmith, Blayre Papania [facility] 09/19/2016, 3:17 PM

## 2016-09-19 NOTE — Telephone Encounter (Signed)
[facility]inal[facility] called in regards to Mr. Pricilla Holmucker to see if he has any shoulder restrictions due to being in Inpatient Rehabilitation after having Arthroscopy of his shoulder. Please call her about this.  Pamela's ph# [phone] Thank you.

## 2016-09-19 NOTE — Progress Notes (Signed)
[place] PHYSICAL MEDICINE & REHABILITATION     PROGRESS NOTE    Subjective/Complaints: Pt seen sitting up in his chair this AM.  He had a "very good night".  He is more positive and denies breathing difficulties.   ROS: Denies CP, SOB, N/V/D.   Objective: Vital Signs: Blood pressure 122/68, pulse 78, temperature 98.1 F (36.7 C), temperature source Oral, resp. rate 16, height 5\' 9"  (1.753 m), weight 72.4 kg (159 lb 9.8 oz), SpO2 97 %. No results found. No results for input(s): WBC, HGB, HCT, PLT in the last 72 hours. No results for input(s): NA, K, CL, GLUCOSE, BUN, CREATININE, CALCIUM in the last 72 hours.  Invalid input(s): CO CBG (last 3)   Recent Labs  09/18/16 1634 09/18/16 2022 09/19/16 0630  GLUCAP 175* 158* 137*    Wt Readings from Last 3 Encounters:  09/19/16 72.4 kg (159 lb 9.8 oz)  09/14/16 79.5 kg (175 lb 3.2 oz)  08/14/16 90.7 kg (200 lb)    Physical Exam:  Constitutional: No distress. Vital signs reviewed.  HENT:  [place]. NT.  Eyes: EOMI. No discharge.  Neck: Normal range of motion.  Cuffed # 6 trach in place with PMV. Cardiovascular: RRR. No JVD. Respiratory: CTA b. Unlabored GI: BS+ NT/ND Musculoskeletal: He exhibits no edemaor tenderness.  Neurological: He is alertand oriented.  Motor: 4/5 throughout, except right shoulder limited Skin: Skin is warmand dry. He is not diaphoretic.  Psychiatric: pleasant. cooperative   Assessment/Plan: 1. Functional deficits secondary to debility and anoxic encephalopathy which require 3+ hours per day of interdisciplinary therapy in a comprehensive inpatient rehab setting. Physiatrist is providing close team supervision and 24 hour management of active medical problems listed below. Physiatrist and rehab team continue to assess barriers to discharge/monitor patient progress toward functional and medical goals.  Function:  Bathing Bathing position   Position: Wheelchair/chair at sink  Bathing parts  Body parts bathed by patient: Right arm, Chest, Abdomen, Right upper leg, Left upper leg, Left arm, Front perineal area Body parts bathed by helper: Buttocks, Right lower leg, Left lower leg, Back  Bathing assist Assist Level:  (Stedy lift for sit to stand)      Upper Body Dressing/Undressing Upper body dressing   What is the patient wearing?: Pull over shirt/dress     Pull over shirt/dress - Perfomed by patient: Pull shirt over trunk, Thread/unthread right sleeve, Thread/unthread left sleeve Pull over shirt/dress - Perfomed by helper: Put head through opening Button up shirt - Perfomed by patient: Thread/unthread right sleeve, Thread/unthread left sleeve Button up shirt - Perfomed by helper: Pull shirt around back, Button/unbutton shirt    Upper body assist Assist Level:  (Mod A)      Lower Body Dressing/Undressing Lower body dressing   What is the patient wearing?: Underwear, Pants Underwear - Performed by patient: Pull underwear up/down Underwear - Performed by helper: Thread/unthread right underwear leg, Thread/unthread left underwear leg Pants- Performed by patient: Thread/unthread right pants leg, Thread/unthread left pants leg Pants- Performed by helper: Thread/unthread right pants leg, Thread/unthread left pants leg, Pull pants up/down   Non-skid slipper socks- Performed by helper: Don/doff right sock, Don/doff left sock                  Lower body assist Assist for lower body dressing: Touching or steadying assistance (Pt > 75%)      Toileting Toileting   Toileting steps completed by patient: Performs perineal hygiene Toileting steps completed by helper: Adjust clothing  prior to toileting, Adjust clothing after toileting    Toileting assist Assist level: Two helpers   Transfers Chair/bed transfer   Chair/bed transfer method: Stand pivot Chair/bed transfer assist level: Touching or steadying assistance (Pt > 75%) Chair/bed transfer assistive device:  Armrests Mechanical lift: Stedy   Locomotion Ambulation Ambulation activity did not occur: Safety/medical concerns   Max distance: 10 Assist level: Touching or steadying assistance (Pt > 75%)   Wheelchair   Type: Manual Max wheelchair distance: 150 Assist Level: Moderate assistance (Pt 50 - 74%)  Cognition Comprehension Comprehension assist level: Follows complex conversation/direction with no assist  Expression Expression assist level: Expresses basic 90% of the time/requires cueing < 10% of the time.  Social Interaction Social Interaction assist level: Interacts appropriately 75 - 89% of the time - Needs redirection for appropriate language or to initiate interaction.  Problem Solving Problem solving assist level: Solves basic 75 - 89% of the time/requires cueing 10 - 24% of the time  Memory Memory assist level: Recognizes or recalls 90% of the time/requires cueing < 10% of the time   Medical Problem List and Plan: 1. Functional, mobility and cognitive deficits secondary to debility/?anoxic encephalopathy -continue CIR therapies 2. DVT Prophylaxis/Anticoagulation: Pharmaceutical: Lovenox 3. Pain Management: Added low dose Neurontin to help with neuropathy BUE.     used ibuprofen daily for myalgias at home.  4. Mood: Team to provide ego support. LCSW to follow for evaluation and support.  5. Neuropsych: This patient is not fully capable of making decisions on hisown behalf. 6. Skin/Wound Care: routine trach care. Continue airmatress overlay for pressure relief measures.  7. Fluids/Electrolytes/Nutrition: push po  K+ 3.3 11/18, repleting   Labs pending 8. VDRF: Cuffed #6 trach in place. No secretions noted and tolerating PMSV with 40% ATC very well.  -?downsize this week, pulm consulted, appreciate recs 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 Anxiety:   -now on celexa  -xanax prn for breakthrough symptoms. 11. DM type 2:   Hgb A1C  6.9.  Continue Lantus   Will monitor BS ac/hs and use SSI as well.   Overall controlled, will cont to monitor 12. Leukocytosis  Likely related to steroids  WBCs 11.4 11/18  Labs pending  LOS (Days) 4 A FACE TO FACE EVALUATION WAS PERFORMED  Feven Alderfer Karis Jubanil Jaine Estabrooks 09/19/2016 9:31 AM

## 2016-09-19 NOTE — Consult Note (Signed)
Name: Bradley FraiseDavid M [name] MRN: [id number] DOB: 11-13-1960    ADMISSION DATE:  09/15/2016 CONSULTATION DATE:  09/19/16  REFERRING MD :  Dr. Allena KatzPatel / Marissa NestlePam Love, PA-C   CHIEF COMPLAINT:  "trach balloon inflates itself with hard coughing"   HISTORY OF PRESENT ILLNESS:  55 y/o M with PMH of anxiety,  COPD, ongoing tobacco abuse prior to current admit, interstitial pulmonary fibrosis, and recent right should arthroscopy (08/14/16) who later developed progressive SOB, productive cough and [facility]16 with acute on chronic hypoxic / hypercarbic respiratory failure in the setting of IPF +/- ALI with HCAP.  He had prolonged ventilator needs due to underlying pulmonary disease and ultimately required tracheostomy (11/6).  Hospital course complicated by metabolic encephalopathy, critical illness myopathy and he was transferred to [facility] for vent weaning.  The patient was weaned off mechanical ventilation to PSV during daytime hours.  He was discharged to [facility] on 11/17 for further intensive rehab.  The patient had progressed to PMV and oral diet.  PCCM called back 11/21 for concerns of the "cuff blowing up when he coughs".    PAST MEDICAL HISTORY :   has a past medical history of Anxiety; Asthma; COPD (chronic obstructive pulmonary disease) (HCC); Diabetes mellitus type 2 without retinopathy (HCC); Dyspnea; History of kidney stones; Pneumonia; and Pulmonary fibrosis (HCC).   has a past surgical history that includes Multiple tooth extractions; Tonsillectomy; Lithotripsy; Colonoscopy w/ polypectomy; and Shoulder arthroscopy with rotator cuff repair (Right, 08/14/2016).  Prior to Admission medications   Medication Sig Start Date End Date Taking? Authorizing Provider  albuterol (PROVENTIL HFA;VENTOLIN HFA) 108 (90 BASE) MCG/ACT inhaler Inhale 1 puff into the lungs every 6 (six) hours as needed for wheezing or shortness of breath.   Yes Historical Provider, MD  citalopram (CELEXA) 20 MG tablet Take  30 mg by mouth every morning.    Yes Historical Provider, MD  enoxaparin (LOVENOX) 40 MG/0.4ML injection Inject 0.4 mLs (40 mg total) into the skin daily. 09/08/16  Yes Tobey GrimKatalina M Eubanks, NP  famotidine (PEPCID) 20-0.9 MG/50ML-% Inject 50 mLs (20 mg total) into the vein every 12 (twelve) hours. 09/08/16  Yes Tobey GrimKatalina M Eubanks, NP  fexofenadine-pseudoephedrine (ALLEGRA-D) 60-120 MG 12 hr tablet Take 1 tablet by mouth every morning.    Yes Historical Provider, MD  Fluticasone-Salmeterol (ADVAIR) 250-50 MCG/DOSE AEPB Inhale 1 puff into the lungs 2 (two) times daily.   Yes Historical Provider, MD  ibuprofen (ADVIL,MOTRIN) 200 MG tablet Take 600 mg by mouth every 6 (six) hours as needed for mild pain.   Yes Historical Provider, MD  insulin aspart (NOVOLOG) 100 UNIT/ML injection Inject 0-15 Units into the skin every 4 (four) hours. 09/08/16  Yes Tobey GrimKatalina M Eubanks, NP  insulin aspart (NOVOLOG) 100 UNIT/ML injection Inject 3 Units into the skin every 4 (four) hours. 09/08/16  Yes Tobey GrimKatalina M Eubanks, NP  insulin glargine (LANTUS) 100 UNIT/ML injection Inject 0.18 mLs (18 Units total) into the skin at bedtime. 09/08/16  Yes Arvilla Marketatherine Lauren Wallace, DO  ipratropium (ATROVENT) 0.02 % nebulizer solution Take 2.5 mLs (0.5 mg total) by nebulization 3 (three) times daily. 09/08/16  Yes Arvilla Marketatherine Lauren Wallace, DO  levalbuterol (XOPENEX) 1.25 MG/0.5ML nebulizer solution Take 1.25 mg by nebulization 3 (three) times daily. 09/08/16  Yes Arvilla Marketatherine Lauren Wallace, DO  mouth rinse LIQD solution 15 mLs by Mouth Rinse route QID. 09/08/16  Yes Arvilla Marketatherine Lauren Wallace, DO  Nutritional Supplements (FEEDING SUPPLEMENT, VITAL AF 1.2 CAL,) LIQD Place 1,000  mLs into feeding tube continuous. 09/08/16  Yes Arvilla Market, DO  polyethylene glycol [facility] / GLYCOLAX) packet Take 17 g by mouth daily as needed for moderate constipation. 09/08/16  Yes Tobey Grim, NP  predniSONE (DELTASONE) 5 MG tablet 1 tablet (5  mg total) by Per NG tube route daily with breakfast. 09/09/16  Yes Arvilla Market, DO    Allergies  Allergen Reactions  . Wellbutrin [Bupropion] Cough    FAMILY HISTORY:  family history includes Colon cancer in his mother; Heart disease in his mother.  SOCIAL HISTORY:  reports that he has been smoking Cigarettes.  He has a 30.00 pack-year smoking history. He has never used smokeless tobacco. He reports that he does not drink alcohol or use drugs.  REVIEW OF SYSTEMS:  POSITIVES IN BOLD Constitutional: Negative for fever, chills, weight loss, malaise/fatigue and diaphoresis.  HENT: Negative for hearing loss, ear pain, nosebleeds, congestion, sore throat, neck pain, tinnitus and ear discharge.   Eyes: Negative for blurred vision, double vision, photophobia, pain, discharge and redness.  Respiratory: Negative for cough, hemoptysis, occasional yellow sputum production, shortness of breath, wheezing and stridor.   Cardiovascular: Negative for chest pain, palpitations, orthopnea, claudication, leg swelling and PND.  Gastrointestinal: Negative for heartburn, nausea, vomiting, abdominal pain, diarrhea, constipation, blood in stool and melena.  Genitourinary: Negative for dysuria, urgency, frequency, hematuria and flank pain.  Musculoskeletal: Negative for myalgias, back pain, joint pain and falls.  Skin: Negative for itching and rash.  Neurological: Negative for dizziness, tingling, tremors, sensory change, speech change, focal weakness, seizures, loss of consciousness, weakness and headaches.  Endo/Heme/Allergies: Negative for environmental allergies and polydipsia. Does not bruise/bleed easily.  SUBJECTIVE: pt anxious to have trach downsized / ultimately removed   VITAL SIGNS: Temp:  [97.4 F (36.3 C)-98.9 F (37.2 C)] 98.1 F (36.7 C) (11/21 0500) Pulse Rate:  [76-91] 78 (11/21 0500) Resp:  [16-18] 16 (11/21 0500) BP: (114-124)/(65-78) 122/68 (11/21 0500) SpO2:  [97 %-100 %]  97 % (11/21 0924) FiO2 (%):  [21 %-28 %] 28 % (11/21 0924) Weight:  [159 lb 9.8 oz (72.4 kg)] 159 lb 9.8 oz (72.4 kg) (11/21 0500)  PHYSICAL EXAMINATION: General:  Chronically ill appearing male in NAD Neuro:  AAOx4, speech clear, MAE  HEENT:  MM pink/moist, no jvd  Cardiovascular:  s1s2 rrr, no m/r/g  Lungs:  Even/non-labored, lungs bilaterally distant but clear  Abdomen:  Obese/soft, bsx4 active  Musculoskeletal:  No acute deformities  Skin:  Warm/dry, no edema    Recent Labs Lab 09/16/16 0440  NA 138  K 3.3*  CL 102  CO2 26  BUN 10  CREATININE 0.48*  GLUCOSE 101*     Recent Labs Lab 09/16/16 0440  HGB 13.2  HCT 39.3  WBC 11.4*  PLT 160    No results found.    SIGNIFICANT EVENTS  11/17  Admit to CIR   STUDIES:      ASSESSMENT / PLAN:  Chronic Respiratory Failure s/p Tracheostomy  COPD  Interstitial Pulmonary Fibrosis  Tobacco Abuse   Plan: Change trach to #4 cuffless per RT  Follow up later in week for potential capping trials > secretions might be barrier (minimal) SLP efforts for PMV  Intermittent CXR  Continue aggressive PT efforts     Canary Brim, NP-C [facility] Pgr: [phone] or if no answer [phone] 09/19/2016, 12:09 PM  Attending note: I have seen and examined the patient with nurse practitioner/resident and agree with the  note. History, labs and imaging reviewed.  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 He had a prolonged hospitalization for respiratory failure with ARDS, HCAP after rotator cuff repair. [facility] and then to rehab  PCCM called as pt was complaining that the cuff inflates when he coughs. He seems stable on examination. I suspect the trach is irritating his airway giving the sensation of the cuff inflating  We will downsize to #4 cuffless. He needs to get started on capping trials and  assessed for decannulation. We will follow back next week.  Chilton GreathousePraveen Kalab Camps MD [facility] Pager [phone] If no answer or after 3pm call: [phone] 09/19/2016, 5:23 PM

## 2016-09-19 NOTE — Progress Notes (Signed)
Physical Therapy Session Note  Patient Details  Name: Bradley [name] MRN: [id number] Date of Birth: 20-Sep-1961  Today's Date: 09/19/2016 PT Individual Time: 1500-1553 PT Individual Time Calculation (min): 53 min    Short Term Goals: Week 1:  PT Short Term Goal 1 (Week 1): Patient will be able to perform sit to stand with min [name] and maintain standing for up to 1 min w/o BP changes PT Short Term Goal 2 (Week 1): Patient will be able to perform transfer bed to w/c with min [name] and no LOB PT Short Term Goal 3 (Week 1): Patient will be able to complete gait training on adistance of 50 feet with LRAD with min [name] PT Short Term Goal 4 (Week 1): Patient will be able to navigate up and down 3 steps with one L side rail with mod [name]  Skilled Therapeutic Interventions/Progress Updates:    Patient seen at later time due to difficulty with cuffed trach self inflating, downsized to Shiley 4 cuffless. Patient in wheelchair upon arrival in good spirits. Patient propelled wheelchair to gym using BUE with supervision and increased time. Gait training using RW 2 x 20 ft with min guard. Stair training up/down 4 (6") stairs using 2 rails with mod [name] and prolonged seated rest break afterward due to fatigue. Sit <> stand transfer training using RW from wheelchair, 2 trials of 3 reps each with decreased eccentric control and verbal cues to push up with BUE and instead of RUE only (post-op side) with min [name] overall. Performed stand pivot transfer to and from NuStep with min [name]. NuStep using BUE/BLE at level 3 x 5 min for strengthening and cardiopulmonary endurance. Patient instructed in simulated car transfer to truck height using RW with min [name] and initial demonstration for technique. Sp02 monitored throughout session > 92% on room air. Patient requires prolonged rest breaks between mobility tasks due to SOB and fatigue. Patient left in wheelchair, handoff to next PT.   Therapy Documentation Precautions:   Precautions Precautions: Shoulder, Other (comment), Fall Type of Shoulder Precautions: Hx of R-RTC repair 08/14/16 Shoulder Interventions: Shoulder sling/immobilizer Precaution Comments: trach  Restrictions Weight Bearing Restrictions: No Pain: Shooting pain in R thigh, provided rest  See Function Navigator for Current Functional Status.   Therapy/Group: Individual Therapy  Kerney ElbeVarner, Bradley [name] 09/19/2016, 4:07 PM

## 2016-09-20 ENCOUNTER — Inpatient Hospital Stay (HOSPITAL_COMMUNITY): Payer: BLUE CROSS/BLUE SHIELD

## 2016-09-20 ENCOUNTER — Inpatient Hospital Stay (HOSPITAL_COMMUNITY): Payer: BLUE CROSS/BLUE SHIELD | Admitting: Occupational Therapy

## 2016-09-20 ENCOUNTER — Inpatient Hospital Stay (HOSPITAL_COMMUNITY): Payer: BLUE CROSS/BLUE SHIELD | Admitting: Speech Pathology

## 2016-09-20 DIAGNOSIS — F411 Generalized anxiety disorder: Secondary | ICD-10-CM

## 2016-09-20 DIAGNOSIS — R1310 Dysphagia, unspecified: Secondary | ICD-10-CM

## 2016-09-20 LAB — GLUCOSE, CAPILLARY
GLUCOSE-CAPILLARY: 168 mg/dL — AB (ref 65–99)
Glucose-Capillary: 155 mg/dL — ABNORMAL HIGH (ref 65–99)
Glucose-Capillary: 173 mg/dL — ABNORMAL HIGH (ref 65–99)
Glucose-Capillary: 97 mg/dL (ref 65–99)

## 2016-09-20 MED ORDER — INSULIN GLARGINE 100 UNIT/ML ~~LOC~~ SOLN
22.0000 [IU] | Freq: Every day | SUBCUTANEOUS | Status: DC
  Administered 2016-09-20 – 2016-09-27 (×8): 22 [IU] via SUBCUTANEOUS
  Filled 2016-09-20 (×9): qty 0.22

## 2016-09-20 NOTE — Telephone Encounter (Signed)
Had RC repair , needs to be 6 wks out before begins resistive work. OK for passive ROM

## 2016-09-20 NOTE — Progress Notes (Signed)
Occupational Therapy Session Note  Patient Details  Name: Bradley [name] MRN: [id number] Date of Birth: 03-04-1961  Today's Date: 09/20/2016 OT Individual Time: [id number] OT Individual Time Calculation (min): 43 min    Short Term Goals: Week 1:  OT Short Term Goal 1 (Week 1): Complete toilet transfer with min assist OT Short Term Goal 2 (Week 1): Complete upper body bathing seated at EOB or sink, with mod assist OT Short Term Goal 3 (Week 1): Complete lower body bathing with mod assist using AE, prn OT Short Term Goal 4 (Week 1): Groom seated with mod assist OT Short Term Goal 5 (Week 1): Maintain supported standing balance for 2 minutes during assisted dressing  Skilled Therapeutic Interventions/Progress Updates:   Pt was sitting in recliner at time of arrival with spouse Bradley [name] present. Pt requested having room home exercise program to work on UE/LE strengthening outside of therapy. Pt was provided with individualized HEP with included abdominal, bilateral LE, and L UE exercises with Min A for completion with instruction on technique and breathing coordination. Pt able to use theraband for left bicep exercises but completes AROM of all other L UE exercises. HR/02 sats stable during exercises with no c/o dizziness. Pt completed toilet transfer with Min A and RW to update safety plan for conference. HR/02 sats stable pre and post transfer. Pt educated on importance of PLB when ambulating due to decrease to 93-94% on room air after transfer (before it was 98%). No c/o dizziness. At end of session pt was left with Bradley [name] present and all needs within reach.  Pt requested for shower with therapy either this evening or tomorrow.   Therapy Documentation Precautions:  Precautions Precautions: Shoulder, Other (comment), Fall Type of Shoulder Precautions: Hx of R-RTC repair 08/14/16 Shoulder Interventions: Shoulder sling/immobilizer Precaution Comments: trach  Restrictions Weight Bearing  Restrictions: No General:   Vital Signs: Therapy Vitals Pulse Rate: 98 Resp: 18 BP: 124/71 Patient Position (if appropriate): Sitting Oxygen Therapy SpO2: 96 % O2 Device: Not Delivered Pain: No c/o pain during session  Pain Assessment Pain Assessment: No/denies pain Pain Score: 4  Pain Type: Acute pain Pain Location: Leg Pain Orientation: Right;Left Pain Descriptors / Indicators: Aching Patients Stated Pain Goal: 2 Pain Intervention(s): Medication (See eMAR) ADL: ADL ADL Comments: See Functional Assessment Tool     See Function Navigator for Current Functional Status.  Therapy/Group: Individual Therapy  Bradley [name] 09/20/2016, 12:21 PM

## 2016-09-20 NOTE — Progress Notes (Signed)
[place] PHYSICAL MEDICINE & REHABILITATION     PROGRESS NOTE    Subjective/Complaints: Pt seen working with therapies.  He states he slept well overnight and is positive.  He was downsized yesterday.   ROS: Denies CP, SOB, N/V/D.  Objective: Vital Signs: Blood pressure 124/71, pulse 98, temperature 97.1 F (36.2 C), temperature source Oral, resp. rate 18, height 5\' 9"  (1.753 m), weight 72.4 kg (159 lb 9.8 oz), SpO2 96 %. No results found.  Recent Labs  09/19/16 1158  WBC 10.3  HGB 13.2  HCT 40.3  PLT 257    Recent Labs  09/19/16 1158  NA 137  K 4.0  CL 102  GLUCOSE 201*  BUN 6  CREATININE 0.54*  CALCIUM 8.8*   CBG (last 3)   Recent Labs  09/19/16 2032 09/20/16 0619 09/20/16 1121  GLUCAP 121* 155* 168*    Wt Readings from Last 3 Encounters:  09/20/16 72.4 kg (159 lb 9.8 oz)  09/14/16 79.5 kg (175 lb 3.2 oz)  08/14/16 90.7 kg (200 lb)    Physical Exam:  Constitutional: No distress. Vital signs reviewed. Well-developed.  HENT:  [place]. NT.  Eyes: EOMI. No discharge.  Neck: Normal range of motion. +Trach. Cardiovascular: RRR. No JVD. Respiratory: CTA b/l. Unlabored GI: BS+, NT/ND Musculoskeletal: He exhibits no edemaor tenderness.  Neurological: He is alertand oriented.  Motor: 4/5 throughout, except right shoulder limited (stable) Skin: Skin is warmand dry. He is not diaphoretic.  Psychiatric: pleasant. cooperative   Assessment/Plan: 1. Functional deficits secondary to debility and anoxic encephalopathy which require 3+ hours per day of interdisciplinary therapy in a comprehensive inpatient rehab setting. Physiatrist is providing close team supervision and 24 hour management of active medical problems listed below. Physiatrist and rehab team continue to assess barriers to discharge/monitor patient progress toward functional and medical goals.  Function:  Bathing Bathing position   Position: Wheelchair/chair at sink  Bathing parts Body  parts bathed by patient: Right arm, Chest, Abdomen, Right upper leg, Left upper leg, Left arm, Front perineal area Body parts bathed by helper: Buttocks, Right lower leg, Left lower leg, Back  Bathing assist Assist Level:  (Stedy lift for sit to stand)      Upper Body Dressing/Undressing Upper body dressing   What is the patient wearing?: Pull over shirt/dress     Pull over shirt/dress - Perfomed by patient: Pull shirt over trunk, Thread/unthread right sleeve, Thread/unthread left sleeve Pull over shirt/dress - Perfomed by helper: Put head through opening Button up shirt - Perfomed by patient: Thread/unthread right sleeve, Thread/unthread left sleeve Button up shirt - Perfomed by helper: Pull shirt around back, Button/unbutton shirt    Upper body assist Assist Level:  (Mod A)      Lower Body Dressing/Undressing Lower body dressing   What is the patient wearing?: Underwear, Pants Underwear - Performed by patient: Pull underwear up/down Underwear - Performed by helper: Thread/unthread right underwear leg, Thread/unthread left underwear leg Pants- Performed by patient: Thread/unthread right pants leg, Thread/unthread left pants leg Pants- Performed by helper: Thread/unthread right pants leg, Thread/unthread left pants leg, Pull pants up/down   Non-skid slipper socks- Performed by helper: Don/doff right sock, Don/doff left sock                  Lower body assist Assist for lower body dressing: Touching or steadying assistance (Pt > 75%)      Toileting Toileting   Toileting steps completed by patient: Adjust clothing prior to toileting Toileting  steps completed by helper: Adjust clothing after toileting Toileting Assistive Devices: Grab bar or rail  Toileting assist Assist level: Supervision or verbal cues, Touching or steadying assistance (Pt.75%)   Transfers Chair/bed transfer   Chair/bed transfer method: Stand pivot, Ambulatory Chair/bed transfer assist level: Touching  or steadying assistance (Pt > 75%) Chair/bed transfer assistive device: Armrests, Walker Mechanical lift: Landscape architecttedy   Locomotion Ambulation Ambulation activity did not occur: Safety/medical concerns   Max distance: 95' Assist level: Touching or steadying assistance (Pt > 75%)   Wheelchair   Type: Manual Max wheelchair distance: 150 Assist Level: Supervision or verbal cues  Cognition Comprehension Comprehension assist level: Understands complex 90% of the time/cues 10% of the time  Expression Expression assist level: Expresses basic needs/ideas: With extra time/assistive device  Social Interaction Social Interaction assist level: Interacts appropriately 90% of the time - Needs monitoring or encouragement for participation or interaction.  Problem Solving Problem solving assist level: Solves basic 75 - 89% of the time/requires cueing 10 - 24% of the time  Memory Memory assist level: Recognizes or recalls 75 - 89% of the time/requires cueing 10 - 24% of the time   Medical Problem List and Plan: 1. Functional, mobility and cognitive deficits secondary to debility/?anoxic encephalopathy with recent history of right rotator cuff repair -continue CIR therapies 2. DVT Prophylaxis/Anticoagulation: Pharmaceutical: Lovenox 3. Pain Management: Added low dose Neurontin to help with neuropathy BUE.     used ibuprofen daily for myalgias at home.  4. Mood: Team to provide ego support. LCSW to follow for evaluation and support.  5. Neuropsych: This patient is not fully capable of making decisions on hisown behalf. 6. Skin/Wound Care: routine trach care. Continue airmatress overlay for pressure relief measures.  7. Fluids/Electrolytes/Nutrition: push po  D3 thin diet, will advance as tolerated  K+ 3.4 11/21, repleting  8. VDRF:  Downsized trach #4 cuffless 11/21 pulm recs appreciated 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 Anxiety:   -now on celexa  -xanax prn for  breakthrough symptoms. 11. DM type 2:   Hgb A1C 6.9.  Lantus increased to 22U on 11/22  Will monitor BS ac/hs and use SSI as well.   Will cont to monitor 12. Leukocytosis  Likely related to steroids  WBCs 10.3 11/21  LOS (Days) 5 A FACE TO FACE EVALUATION WAS PERFORMED  Ankit Karis Jubanil Patel 09/20/2016 12:49 PM

## 2016-09-20 NOTE — Patient Care Conference (Signed)
Inpatient RehabilitationTeam Conference and Plan of Care Update Date: 09/20/2016   Time: 10:15 AM    Patient Name: Bradley [name]      Medical Record Number: [id number]  Date of Birth: [birth year] Sex: Male         Room/Bed: 4W17C/4W17C-01 Payor Info: Payor: BLUE CROSS BLUE [employer]IELD / Plan: BCBS OTHER / Product Type: *No Product type* /    Admitting Diagnosis: Debility withTrach  Admit Date/Time:  09/15/2016  4:20 PM Admission Comments: No comment available   Primary Diagnosis:  Debility Principal Problem: Debility  Patient Active Problem List   Diagnosis Date Noted  . Anxiety state   . Dysphagia   . Hypokalemia   . Generalized anxiety disorder   . Leukocytosis   . Diabetes mellitus type 2 without retinopathy (HCC)   . Debility 09/15/2016  . Anoxic encephalopathy (HCC)   . Tracheostomy status (HCC)   . ARDS (adult respiratory distress syndrome) (HCC)   . Acute respiratory failure with hypoxia (HCC) 08/18/2016  . Community acquired pneumonia 08/18/2016  . COPD exacerbation (HCC) 08/18/2016  . Depression 08/18/2016  . Sepsis (HCC) 08/18/2016  . Complete tear of right rotator cuff 08/14/2016  . Right rotator cuff tear 08/14/2016  . Cigarette smoker 07/23/2015  . Obesity 07/23/2015  . Pulmonary fibrosis (HCC) 07/22/2015    Expected Discharge Date: Expected Discharge Date: 09/28/16  Team Members Present: Physician leading conference: Dr. Maryla MorrowAnkit Patel Social Worker Present: Dossie DerBecky Renner Sebald, LCSW Nurse Present: Chana Bodeeborah Sharp, RN PT Present: Karolee StampsAlison Gray, PT OT Present: Other (comment) Maia Breslow(Michaela Hoffman-OT) SLP Present: Jackalyn LombardNicole Page, SLP PPS Coordinator present : Tora DuckMarie Noel, RN, CRRN     Current Status/Progress Goal Weekly Team Focus  Medical   Functional, mobility and cognitive deficits secondary to debility/?anoxic encephalopathy  Improve safety, mobility, labs, DM  See above   Bowel/Bladder   continent of B&B, occasional urgency and incontinence of bladder at night   continent of b&b and manage with minimal assist  manage b&b with minimal assist, q 1-2 day bm   Swallow/Nutrition/ Hydration   Dys 3, thin liquids   mod I   trials of advanced textures    ADL's   Total A  Min A-Mod I   Standing balance, activity tolerance, L UE strengthening, R UE rehab once MD has provided protocol, family education, cognition    Mobility   min A transfers and gait up to 20 ft using RW, mod A 4 steps using 2 rails, decreased endurance  mod I  functional mobility training, standing balance, generalized strengthening, activity tolerance, pt/family education   Communication   Changed to #4 cuffed trach, using speaking valve during all waking hours  mod I   complete education and carryover of safe valve use   Safety/Cognition/ Behavioral Observations  min assist-supervision    mod I   complex problem solving, recall, attention    Pain   headaches/frontal/orbital area controlled with prn tylenol, ultram or roxicodone.  pain control with mild pain reliever and/or minimal use of opiates  assess pain q shift and prn   Skin   pink blanchable buttocks, barrier cream  maintain skin integrity, no s/s of redness or breakdown.  assess skin q shift and apply barrier cream prn      *See Care Plan and progress notes for long and short-term goals.  Barriers to Discharge: Safety, mobility, leukocytosis, hypokalemia, DM, trach    Possible Resolutions to Barriers:  Downsized trach yesterday, therapies, follow labs, optimize DM meds  Discharge Planning/Teaching Needs:  Home with wife who can provide 24 hr supervision but no physical care due to her own health issues.      Team Discussion:  Goals-mod/i level and has done remarkable in the last two days. Trach changed to size 4 cuff less 11/21. Try capping trach this weekend. Upgraded diet to Dys 3 thin. Pt feels much better and can see his progress. Activity tolerance is improving.  Revisions to Treatment Plan:  DC 11/30    Continued Need for Acute Rehabilitation Level of Care: The patient requires daily medical management by a physician with specialized training in physical medicine and rehabilitation for the following conditions: Daily direction of a multidisciplinary physical rehabilitation program to ensure safe treatment while eliciting the highest outcome that is of practical value to the patient.: Yes Daily medical management of patient stability for increased activity during participation in an intensive rehabilitation regime.: Yes Daily analysis of laboratory values and/or radiology reports with any subsequent need for medication adjustment of medical intervention for : Pulmonary problems;Other  Bradley [name], Bradley [name] 09/20/2016, 2:47 PM

## 2016-09-20 NOTE — Progress Notes (Signed)
Speech Language Pathology Daily Session Note  Patient Details  Name: Bradley [name] MRN: [id number] Date of Birth: [birth year]  Today's Date: 09/20/2016 SLP Individual Time: 1402-1500 SLP Individual Time Calculation (min): 58 min   Short Term Goals:Week 1: SLP Short Term Goal 1 (Week 1): Pt will return demonstration of donning and doffing speaking valve with supervision verbal cues.  SLP Short Term Goal 2 (Week 1): Pt will wear speaking valve with no changes in vitals or reports of discomfort for 60 minutes.   SLP Short Term Goal 3 (Week 1): Pt will consume trials of dys 3 textures with mod I use of swallowing precautions over 2 consecutive sessions prior to advancement.   SLP Short Term Goal 4 (Week 1): Pt will verbalize safety precautions for valve use with supervision question cues.  SLP Short Term Goal 5 (Week 1): Pt will complete semi-complex problem solving tasks with Min A verbal cues and 80% accuracy.  SLP Short Term Goal 6 (Week 1): Pt will utilize external memory aids to recall new, daily information with Min A verbal cues.   Skilled Therapeutic Interventions: Pt was seen for skilled ST targeting cognitive goals.  Therapist facilitated the session with medication management tasks targeting problem solving goals.  Pt was able to recall function of medications when named in ~75% of opportunities with supervision question cues.  Pt then loaded pills of varying frequencies into a QD pill box for 100% accuracy with mod I for task organization.  Pt was returned to room and left in wheelchair with call bell within reach.  Continue per current plan of care.    Of note, while dysphagia was not directly addressed during this therapy session, pt reported excellent toleration of dys 3 textures.  RN verified that pt did very well with breakfast and lunch meals today.  Recommend upgrading pt to intermittent supervision during meals.       Function:  Eating Eating                  Cognition Comprehension Comprehension assist level: Understands complex 90% of the time/cues 10% of the time  Expression Expression assistive device: Talk trach valve Expression assist level: Expresses basic needs/ideas: With extra time/assistive device  Social Interaction Social Interaction assist level: Interacts appropriately 90% of the time - Needs monitoring or encouragement for participation or interaction.  Problem Solving Problem solving assist level: Solves basic 75 - 89% of the time/requires cueing 10 - 24% of the time  Memory Memory assist level: Recognizes or recalls 75 - 89% of the time/requires cueing 10 - 24% of the time    Pain Pain Assessment Pain Score: 6  Faces Pain Scale: Hurts a little bit Pain Type: Acute pain Pain Location: Leg Pain Orientation: Right;Left Pain Descriptors  / Indicators: Aching;Discomfort Patients Stated Pain Goal: 2 Pain Intervention(s): RN made aware  Therapy/Group: Individual Therapy  Lailah Marcelli, Melanee SpryNicole L 09/20/2016, 3:01 PM

## 2016-09-20 NOTE — Telephone Encounter (Signed)
I called Bradley [name] and advised. Patient is still in the hospital. Unable to make follow up appt in office at this time.

## 2016-09-20 NOTE — Telephone Encounter (Signed)
Patient is still in hospital. Bradley [name] advised.

## 2016-09-20 NOTE — Progress Notes (Signed)
Physical Therapy Session Note  Patient Details  Name: Bradley [name] MRN: [id number] Date of Birth: [birth year]  Today's Date: 09/20/2016 PT Individual Time: 0800-0900 PT Individual Time Calculation (min): 60 min    Short Term Goals: Week 1:  PT Short Term Goal 1 (Week 1): Patient will be able to perform sit to stand with min A and maintain standing for up to 1 min w/o BP changes PT Short Term Goal 2 (Week 1): Patient will be able to perform transfer bed to w/c with min A and no LOB PT Short Term Goal 3 (Week 1): Patient will be able to complete gait training on adistance of 50 feet with LRAD with min A PT Short Term Goal 4 (Week 1): Patient will be able to navigate up and down 3 steps with one L side rail with mod A  Skilled Therapeutic Interventions/Progress Updates:    Session focused on overall endurance/activity tolerance, gait training with RW with focus on gait pattern and increasing distance, stair negotiation for home entry and functional strengthening, Nustep for overall cardiovascular endurance training and general strengthening with reciprocal movement pattern re-training x 8 min on level 4 for a warm-up. Pt demonstrating improved activity tolerance and overall close supervision to min assist with transfers and gait with RW - good carryover of correct technique and hand placement. Education during rest breaks in regards to pacing himself (especially upon d/c at home), energy conservation techniques, and d/c planning. Pt will benefit from continued practice on stairs for home entry as this is most difficult activity from an energy/strength stand point. Pt able to gait x 95', 60', and 6465' with cues for step length and gait pattern - decreased strength noted proximally at hips.   Pt on room air during session and HR and O2 remained WFL ( 92-98 bpm and 89-96% respectively).   Therapy Documentation Precautions:  Precautions Precautions: Shoulder, Other (comment), Fall Type of Shoulder  Precautions: Hx of R-RTC repair 08/14/16 Shoulder Interventions: Shoulder sling/immobilizer Precaution Comments: trach  Restrictions Weight Bearing Restrictions: No   Vital Signs: Therapy Vitals Pulse Rate: 91 Resp: 18 BP: 124/71 Patient Position (if appropriate): Sitting Oxygen Therapy SpO2: 93 % O2 Device: Not Delivered Pain: Denies pain.  See Function Navigator for Current Functional Status.   Therapy/Group: Individual Therapy  Karolee StampsGray, [name] B. Colbe Viviano, PT, DPT  09/20/2016, 9:35 AM

## 2016-09-20 NOTE — Progress Notes (Signed)
Social Work Patient ID: Gracy Bruins, male   DOB: 1961-04-21, 55 y.o.   MRN: [id number]  Met with pt and wife to discuss team conference goals mod/i level and discharge 11/30. He feels much better and can see  His progress each day. He is pushing himself to do well and knows needs to be mod/i before going hoe for his wife to manage him at home. Will work on discharge needs.

## 2016-09-20 NOTE — Progress Notes (Signed)
Occupational Therapy Session Note  Patient Details  Name: Bradley [name] MRN: [id number] Date of Birth: October 03, 1961  Today's Date: 09/20/2016 OT Individual Time: [id number] OT Individual Time Calculation (min): 51 min     Short Term Goals: Week 1:  OT Short Term Goal 1 (Week 1): Complete toilet transfer with min assist OT Short Term Goal 2 (Week 1): Complete upper body bathing seated at EOB or sink, with mod assist OT Short Term Goal 3 (Week 1): Complete lower body bathing with mod assist using AE, prn OT Short Term Goal 4 (Week 1): Groom seated with mod assist OT Short Term Goal 5 (Week 1): Maintain supported standing balance for 2 minutes during assisted dressing  Skilled Therapeutic Interventions/Progress Updates:   participation in skilled OT shower today was as follows:   This clinician helped him keep water below mid chest level (for extra safety of trach)   walker to shower bench transfer=close S             Upper body bathing seated on tub transfer bench= S and extra time to rinse due to decreased shoulder and abduction - especially on his right rotator cuff shoulder                         Lower body bathing moderate assistance (standing in shower holding onto grab bar for balance and due to fatigue after long day of therapies)  Sit to stand transfer w/c to sink =close S.         Though SOB and fatigued, He was able to maintain supported static standing balance while clinician pulled up underwear and pants and for for LE weight bearing for 3 minutes   Due to fatigue and time constraints his clinician donned his socks  Patient was left seated in his w/c with legs elevated on his bed for comfort and with the respiratory therapist who came in to do a breathing treatment  Therapy Documentation Precautions:  Precautions Precautions: Shoulder, Other (comment), Fall Type of Shoulder Precautions: Hx of R-RTC repair 08/14/16 Shoulder Interventions: Shoulder  sling/immobilizer Precaution Comments: trach  Restrictions Weight Bearing Restrictions: No Pain:denied   See Function Navigator for Current Functional Status.   Therapy/Group: Individual Therapy  Bud Faceickett, Josef Tourigny [facility] InstituteYeary 09/20/2016, 8:34 PM

## 2016-09-20 NOTE — Progress Notes (Signed)
Physical Therapy Make-up Session Note  Patient Details  Name: Bradley [name] MRN: [id number] Date of Birth: 04-18-1961  Today's Date: 09/20/2016 PT Individual Time: 1030-1100 PT Individual Time Calculation (min): 30 min    Short Term Goals: Week 1:  PT Short Term Goal 1 (Week 1): Patient will be able to perform sit to stand with min A and maintain standing for up to 1 min w/o BP changes PT Short Term Goal 2 (Week 1): Patient will be able to perform transfer bed to w/c with min A and no LOB PT Short Term Goal 3 (Week 1): Patient will be able to complete gait training on adistance of 50 feet with LRAD with min A PT Short Term Goal 4 (Week 1): Patient will be able to navigate up and down 3 steps with one L side rail with mod A  Skilled Therapeutic Interventions/Progress Updates:    Session focused on gait training with RW with focus on increasing distance and normalizing gait pattern and introducing [place] Level A HEP for strengthening and balance. Pt fatigued from AM sessions so only able to complete 5 reps each side for each exercise. Due to decreased active DF in standing, recommending to complete toe raises in seated position. Pt able to gait down to therapy gym with 1 seated rest break and cues for pursed lip breathing.    Therapy Documentation Precautions:  Precautions Precautions: Shoulder, Other (comment), Fall Type of Shoulder Precautions: Hx of R-RTC repair 08/14/16 Shoulder Interventions: Shoulder sling/immobilizer Precaution Comments: trach  Restrictions Weight Bearing Restrictions: No   Vital Signs: Room air; HR and O2 WFL Pain: Pain Assessment Pain Assessment: No/denies pain   See Function Navigator for Current Functional Status.   Therapy/Group: Individual Therapy  Karolee StampsGray, Cassandra Harbold Darrol PokeBrescia  Keelon Zurn B. Aizik Reh, PT, DPT  09/20/2016, 11:45 AM

## 2016-09-20 NOTE — Telephone Encounter (Signed)
Please advise. Patient is not scheduled for follow up in office.  I will call him to schedule today.  Would you like me to try and bring him in next week?

## 2016-09-21 ENCOUNTER — Inpatient Hospital Stay (HOSPITAL_COMMUNITY): Payer: BLUE CROSS/BLUE SHIELD | Admitting: Speech Pathology

## 2016-09-21 LAB — GLUCOSE, CAPILLARY
GLUCOSE-CAPILLARY: 259 mg/dL — AB (ref 65–99)
GLUCOSE-CAPILLARY: 155 mg/dL — AB (ref 65–99)
Glucose-Capillary: 98 mg/dL (ref 65–99)
Glucose-Capillary: 156 mg/dL — ABNORMAL HIGH (ref 65–99)

## 2016-09-21 NOTE — Progress Notes (Signed)
[facility] PHYSICAL MEDICINE & REHABILITATION     PROGRESS NOTE    Subjective/Complaints: Pt seen laying in bed this AM.  He slept well overnight.  He has questions about the plan for decannulation.  Will d/c ambien per pt preference.   ROS: Denies CP, SOB, N/V/D.  Objective: Vital Signs: Blood pressure 102/73, pulse 85, temperature 98.3 F (36.8 C), temperature source Oral, resp. rate 17, height 5\' 9"  (1.753 m), weight 72.4 kg (159 lb 9.8 oz), SpO2 98 %. No results found.  Recent Labs  09/19/16 1158  WBC 10.3  HGB 13.2  HCT 40.3  PLT 257    Recent Labs  09/19/16 1158  NA 137  K 4.0  CL 102  GLUCOSE 201*  BUN 6  CREATININE 0.54*  CALCIUM 8.8*   CBG (last 3)   Recent Labs  09/20/16 1638 09/20/16 2043 09/21/16 0627  GLUCAP 173* 97 98    Wt Readings from Last 3 Encounters:  09/21/16 72.4 kg (159 lb 9.8 oz)  09/14/16 79.5 kg (175 lb 3.2 oz)  08/14/16 90.7 kg (200 lb)    Physical Exam:  Constitutional: No distress. Vital signs reviewed. Well-developed.  HENT:  [place]. NT.  Eyes: EOMI. No discharge.  Neck: Normal range of motion. +Trach, PMV. Cardiovascular: RRR. No JVD. Respiratory: CTA b/l. Unlabored GI: BS+, NT/ND Musculoskeletal: He exhibits no edemaor tenderness.  Neurological: He is alertand oriented.  Motor: 4/5 throughout, except right shoulder limited (unchanged) Skin: Skin is warmand dry. He is not diaphoretic.  Psychiatric: pleasant. cooperative   Assessment/Plan: 1. Functional deficits secondary to debility and anoxic encephalopathy which require 3+ hours per day of interdisciplinary therapy in a comprehensive inpatient rehab setting. Physiatrist is providing close team supervision and 24 hour management of active medical problems listed below. Physiatrist and rehab team continue to assess barriers to discharge/monitor patient progress toward functional and medical goals.  Function:  Bathing Bathing position   Position: Shower   Bathing parts Body parts bathed by patient: Right arm, Left arm, Chest, Abdomen, Front perineal area, Right upper leg, Left upper leg, Right lower leg, Left lower leg Body parts bathed by helper: Back, Buttocks  Bathing assist Assist Level: Touching or steadying assistance(Pt > 75%)      Upper Body Dressing/Undressing Upper body dressing   What is the patient wearing?: Pull over shirt/dress     Pull over shirt/dress - Perfomed by patient: Thread/unthread right sleeve, Thread/unthread left sleeve, Pull shirt over trunk Pull over shirt/dress - Perfomed by helper: Put head through opening Button up shirt - Perfomed by patient: Thread/unthread right sleeve, Thread/unthread left sleeve Button up shirt - Perfomed by helper: Pull shirt around back, Button/unbutton shirt    Upper body assist Assist Level: Touching or steadying assistance(Pt > 75%)      Lower Body Dressing/Undressing Lower body dressing   What is the patient wearing?: Pants, Underwear Underwear - Performed by patient: Thread/unthread right underwear leg Underwear - Performed by helper: Pull underwear up/down, Thread/unthread left underwear leg Pants- Performed by patient: Thread/unthread right pants leg, Thread/unthread left pants leg Pants- Performed by helper: Pull pants up/down   Non-skid slipper socks- Performed by helper: Don/doff right sock, Don/doff left sock (tight socks- looser ones may be more successful and less tiring)                  Lower body assist Assist for lower body dressing: Touching or steadying assistance (Pt > 75%)      Toileting Toileting  Toileting steps completed by patient: Adjust clothing prior to toileting, Performs perineal hygiene, Adjust clothing after toileting Toileting steps completed by helper: Adjust clothing after toileting Toileting Assistive Devices: Grab bar or rail  Toileting assist Assist level: Supervision or verbal cues   Transfers Chair/bed transfer   Chair/bed  transfer method: Stand pivot, Ambulatory Chair/bed transfer assist level: Touching or steadying assistance (Pt > 75%) Chair/bed transfer assistive device: Armrests, Walker Mechanical lift: Landscape architecttedy   Locomotion Ambulation Ambulation activity did not occur: Safety/medical concerns   Max distance: 95' Assist level: Touching or steadying assistance (Pt > 75%)   Wheelchair   Type: Manual Max wheelchair distance: 150 Assist Level: Supervision or verbal cues  Cognition Comprehension Comprehension assist level: Understands complex 90% of the time/cues 10% of the time  Expression Expression assist level: Expresses basic needs/ideas: With extra time/assistive device  Social Interaction Social Interaction assist level: Interacts appropriately 90% of the time - Needs monitoring or encouragement for participation or interaction.  Problem Solving Problem solving assist level: Solves basic 75 - 89% of the time/requires cueing 10 - 24% of the time  Memory Memory assist level: Recognizes or recalls 75 - 89% of the time/requires cueing 10 - 24% of the time   Medical Problem List and Plan: 1. Functional, mobility and cognitive deficits secondary to debility/?anoxic encephalopathy with recent history of right rotator cuff repair -continue CIR therapies, on hold today for holiday 2. DVT Prophylaxis/Anticoagulation: Pharmaceutical: Lovenox 3. Pain Management: Added low dose Neurontin to help with neuropathy BUE.     used ibuprofen daily for myalgias at home.  4. Mood: Team to provide ego support. LCSW to follow for evaluation and support.  5. Neuropsych: This patient is not fully capable of making decisions on hisown behalf. 6. Skin/Wound Care: routine trach care. Continue airmatress overlay for pressure relief measures.  7. Fluids/Electrolytes/Nutrition: push po  D3 thin diet, will advance as tolerated  K+ 3.4 11/21, repleting  8. VDRF:  Downsized trach #4 cuffless 11/21 pulm  recs appreciated  Will consider capping Sat. 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 Anxiety:   -now on celexa  -xanax prn for breakthrough symptoms. 11. DM type 2:   Hgb A1C 6.9.  Lantus increased to 22U on 11/22  Will monitor BS ac/hs and use SSI as well.   Improving, will cont to monitor 12. Leukocytosis  Likely related to steroids  WBCs 10.3 11/21  LOS (Days) 6 A FACE TO FACE EVALUATION WAS PERFORMED  Chessie Neuharth Karis Jubanil Zaccheus Edmister 09/21/2016 9:38 AM

## 2016-09-22 ENCOUNTER — Inpatient Hospital Stay (HOSPITAL_COMMUNITY): Payer: BLUE CROSS/BLUE SHIELD | Admitting: Speech Pathology

## 2016-09-22 ENCOUNTER — Inpatient Hospital Stay (HOSPITAL_COMMUNITY): Payer: BLUE CROSS/BLUE SHIELD | Admitting: Occupational Therapy

## 2016-09-22 ENCOUNTER — Inpatient Hospital Stay (HOSPITAL_COMMUNITY): Payer: BLUE CROSS/BLUE SHIELD | Admitting: Physical Therapy

## 2016-09-22 LAB — GLUCOSE, CAPILLARY
GLUCOSE-CAPILLARY: 115 mg/dL — AB (ref 65–99)
Glucose-Capillary: 90 mg/dL (ref 65–99)
Glucose-Capillary: 152 mg/dL — ABNORMAL HIGH (ref 65–99)
Glucose-Capillary: 111 mg/dL — ABNORMAL HIGH (ref 65–99)

## 2016-09-22 NOTE — Progress Notes (Signed)
Occupational Therapy Session Note  Patient Details  Name: Bradley FraiseDavid M [name] MRN: [id number] Date of Birth: 1960-12-09  Today's Date: 09/22/2016 OT Individual Time: 1255-1414 OT Individual Time Calculation (min): 79 min   Short Term Goals: Week 1:  OT Short Term Goal 1 (Week 1): Complete toilet transfer with min assist OT Short Term Goal 2 (Week 1): Complete upper body bathing seated at EOB or sink, with mod assist OT Short Term Goal 3 (Week 1): Complete lower body bathing with mod assist using AE, prn OT Short Term Goal 4 (Week 1): Groom seated with mod assist OT Short Term Goal 5 (Week 1): Maintain supported standing balance for 2 minutes during assisted dressing  Skilled Therapeutic Interventions/Progress Updates:   Pt was sitting at EOB at time of arrival requesting shower. Pt ambulated to bathroom with Min A for transfer to tub bench with instruction on technique. Trach site covered and bathing completed with hand held shower below shoulders for safety. Bathroom door left open for breathing ease. Pt required overall Min A with steady assist for standing as needed with grab bars. Pt then completed dressing at EOB with setup. Nursing consented to application of ACE wraps for bilateral LE edema. Pts legs were wrapped during session. Steady assist provided for sit<stand during LB dressing. Pt completed shaving head and facial hair w/c level at sink with Mod A-Max A for back of head and lateral aspects of head (with safety razor). For remainder of tx, pt completed PROM of R UE x10 reps with instruction on technique in all planes within pain free range. Son Ivin BootyJoshua educated on PROM for family to carry over outside of tx. Pt did not exhibits symptoms of orthostatics during tx with vital monitored and written below. Pt left with family and all needs within reach at time of departure.   Therapy Documentation Precautions:  Precautions Precautions: Shoulder, Other (comment), Fall Type of Shoulder  Precautions: Hx of R-RTC repair 08/14/16 Shoulder Interventions: Shoulder sling/immobilizer Precaution Comments: trach  Restrictions Weight Bearing Restrictions: No General:   Vital Signs: BP: 107/66 (at rest); 111/68 (post bathing); 111/68 (post dressing) 02 sats on room air: 96%  (at rest); 92-93% (post bathing); 97% (post dressing) Therapy Vitals Temp: 97.8 F (36.6 C) Temp Source: Oral Pulse Rate: 85 Resp: 18 BP: 106/60 Patient Position (if appropriate): Sitting Oxygen Therapy SpO2: 97 % O2 Device: Not Delivered Pain: No c/o pain during session  Pain Assessment Pain Score: 2  ADL: ADL ADL Comments: See Functional Assessment Tool:    See Function Navigator for Current Functional Status.   Therapy/Group: Individual Therapy  Kaylyn Garrow A Karam Dunson 09/22/2016, 4:09 PM

## 2016-09-22 NOTE — Progress Notes (Signed)
[facility] PHYSICAL MEDICINE & REHABILITATION     PROGRESS NOTE    Subjective/Complaints: Pt seen sitting up in his chair this AM.  He slept well overnight. He is ready to resume therapies.  He has questions about plan for trach.   ROS: Denies CP, SOB, N/V/D.  Objective: Vital Signs: Blood pressure (!) 89/62, pulse 95, temperature 98.3 F (36.8 C), temperature source Oral, resp. rate 18, height 5\' 9"  (1.753 m), weight 73 kg (160 lb 15 oz), SpO2 96 %. No results found.  Recent Labs  09/19/16 1158  WBC 10.3  HGB 13.2  HCT 40.3  PLT 257    Recent Labs  09/19/16 1158  NA 137  K 4.0  CL 102  GLUCOSE 201*  BUN 6  CREATININE 0.54*  CALCIUM 8.8*   CBG (last 3)   Recent Labs  09/21/16 1646 09/21/16 2040 09/22/16 0627  GLUCAP 259* 155* 90    Wt Readings from Last 3 Encounters:  09/22/16 73 kg (160 lb 15 oz)  09/14/16 79.5 kg (175 lb 3.2 oz)  08/14/16 90.7 kg (200 lb)    Physical Exam:  Constitutional: No distress. Vital signs reviewed. Well-developed.  HENT:  [place]. NT.  Eyes: EOMI. No discharge.  Neck: Normal range of motion. +Trach, PMV. Cardiovascular: RRR. No JVD. Respiratory: CTA b/l. Unlabored GI: BS+, NT/ND Musculoskeletal: He exhibits no edemaor tenderness.  Neurological: He is alertand oriented.  Motor: 4/5 throughout, except right shoulder limited (stable) Skin: Skin is warmand dry. He is not diaphoretic.  Psychiatric: pleasant. cooperative   Assessment/Plan: 1. Functional deficits secondary to debility and anoxic encephalopathy which require 3+ hours per day of interdisciplinary therapy in a comprehensive inpatient rehab setting. Physiatrist is providing close team supervision and 24 hour management of active medical problems listed below. Physiatrist and rehab team continue to assess barriers to discharge/monitor patient progress toward functional and medical goals.  Function:  Bathing Bathing position   Position: Shower  Bathing parts  Body parts bathed by patient: Right arm, Left arm, Chest, Abdomen, Front perineal area, Right upper leg, Left upper leg, Right lower leg, Left lower leg Body parts bathed by helper: Back, Buttocks  Bathing assist Assist Level: Touching or steadying assistance(Pt > 75%)      Upper Body Dressing/Undressing Upper body dressing   What is the patient wearing?: Pull over shirt/dress     Pull over shirt/dress - Perfomed by patient: Thread/unthread right sleeve, Thread/unthread left sleeve, Pull shirt over trunk Pull over shirt/dress - Perfomed by helper: Put head through opening Button up shirt - Perfomed by patient: Thread/unthread right sleeve, Thread/unthread left sleeve Button up shirt - Perfomed by helper: Pull shirt around back, Button/unbutton shirt    Upper body assist Assist Level: Touching or steadying assistance(Pt > 75%)      Lower Body Dressing/Undressing Lower body dressing   What is the patient wearing?: Pants, Underwear Underwear - Performed by patient: Thread/unthread right underwear leg Underwear - Performed by helper: Pull underwear up/down, Thread/unthread left underwear leg Pants- Performed by patient: Thread/unthread right pants leg, Thread/unthread left pants leg Pants- Performed by helper: Pull pants up/down   Non-skid slipper socks- Performed by helper: Don/doff right sock, Don/doff left sock (tight socks- looser ones may be more successful and less tiring)                  Lower body assist Assist for lower body dressing: Touching or steadying assistance (Pt > 75%)      Toileting Toileting  Toileting steps completed by patient: Adjust clothing prior to toileting, Performs perineal hygiene, Adjust clothing after toileting Toileting steps completed by helper: Adjust clothing after toileting Toileting Assistive Devices: Grab bar or rail  Toileting assist Assist level: Supervision or verbal cues   Transfers Chair/bed transfer   Chair/bed transfer method:  Ambulatory Chair/bed transfer assist level: Supervision or verbal cues Chair/bed transfer assistive device: Armrests, Walker Mechanical lift: Landscape architecttedy   Locomotion Ambulation Ambulation activity did not occur: Safety/medical concerns   Max distance: 75 ft Assist level: Supervision or verbal cues   Wheelchair   Type: Manual Max wheelchair distance: 150 Assist Level: No help, No cues, assistive device, takes more than reasonable amount of time  Cognition Comprehension Comprehension assist level: Follows complex conversation/direction with extra time/assistive device  Expression Expression assist level: Expresses complex ideas: With extra time/assistive device  Social Interaction Social Interaction assist level: Interacts appropriately with others - No medications needed.  Problem Solving Problem solving assist level: Solves basic problems with no assist  Memory Memory assist level: Recognizes or recalls 90% of the time/requires cueing < 10% of the time   Medical Problem List and Plan: 1. Functional, mobility and cognitive deficits secondary to debility/?anoxic encephalopathy with recent history of right rotator cuff repair -continue CIR therapies 2. DVT Prophylaxis/Anticoagulation: Pharmaceutical: Lovenox 3. Pain Management: Added low dose Neurontin to help with neuropathy BUE.     used ibuprofen daily for myalgias at home.  4. Mood: Team to provide ego support. LCSW to follow for evaluation and support.  5. Neuropsych: This patient is not fully capable of making decisions on hisown behalf. 6. Skin/Wound Care: routine trach care. Continue airmatress overlay for pressure relief measures.  7. Fluids/Electrolytes/Nutrition: push po  D3 thin diet, will advance as tolerated  K+ 3.4 11/21, repleting  8. VDRF:   Downsized trach #4 cuffless 11/21 pulm recs appreciated  Will consider capping Sat. 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 Anxiety:   -now on  celexa  -xanax prn for breakthrough symptoms. 11. DM type 2:   Hgb A1C 6.9.  Lantus increased to 22U on 11/22  Will monitor BS ac/hs and use SSI as well.   Improving, with elevation yesterday, likely related food, will cont to monitor 12. Leukocytosis  Likely related to steroids  WBCs 10.3 11/21  LOS (Days) 7 A FACE TO FACE EVALUATION WAS PERFORMED  Tyreanna Bisesi Karis Jubanil Hoang Pettingill 09/22/2016 9:12 AM

## 2016-09-22 NOTE — Progress Notes (Signed)
Trach popped out of patients neck while eating dinner.  Respiratory called.  Patient due to be capped tomorrow and decannulate Monday.  Consulted MD on need to re-insert.  Patients oxygen 96%, good strong cough noted.  Dr. Allena KatzPatel recommended to just monitor closely and NOT re-insert trach.  Dani Gobbleeardon, Karyl Sharrar J, RN

## 2016-09-22 NOTE — Progress Notes (Signed)
Name: Bradley FraiseDavid M [name] MRN: [id number] DOB: Jun 09, 1961    ADMISSION DATE:  09/15/2016 CONSULTATION DATE:  09/19/16  REFERRING MD :  Dr. Allena KatzPatel / Marissa NestlePam Love, PA-C   CHIEF COMPLAINT:  "trach balloon inflates itself with hard coughing"   HISTORY OF PRESENT ILLNESS:  55 y/o M with PMH of anxiety,  COPD, ongoing tobacco abuse prior to current admit, interstitial pulmonary fibrosis, and recent right should arthroscopy (08/14/16) who later developed progressive SOB, productive cough and [facility] with acute on chronic hypoxic / hypercarbic respiratory failure in the setting of IPF +/- ALI with HCAP.  He had prolonged ventilator needs due to underlying pulmonary disease and ultimately required tracheostomy (11/6).  Hospital course complicated by metabolic encephalopathy, critical illness myopathy and [facility].  The patient was weaned off mechanical ventilation to PSV during daytime hours.  [facility]17 for further intensive rehab.  The patient had progressed to PMV and oral diet.  PCCM called back 11/21 for concerns of the "cuff blowing up when he coughs". [name] was downsized to a #4 cuffless with improvement and now has continued to progress.       Allergies  Allergen Reactions  . Wellbutrin [Bupropion] Cough    SOCIAL HISTORY:  reports that he has been smoking Cigarettes.  He has a 30.00 pack-year smoking history. He has never used smokeless tobacco. He reports that he does not drink alcohol or use drugs.  SUBJECTIVE: pt anxious to have trach downsized / ultimately removed . Has had some ortho stasis this am ( 11/24) during his rehab session.  Discussion: Wearing PM valve 24/7. Excited to have trach capping trials beginning   11/25 and if tolerates x 48 hours plan is to de cannulate  11/27. Plan is for discharge home 11/30 ( Next Thursday)  VITAL SIGNS: Temp:  [97.9 F (36.6 C)-98.3 F (36.8 C)] 98.3 F (36.8 C)  (11/24 0503) Pulse Rate:  [81-93] 88 (11/24 0503) Resp:  [15-18] 18 (11/24 0503) BP: (108-118)/(68-76) 108/68 (11/24 0503) SpO2:  [96 %-100 %] 96 % (11/24 0828) Weight:  [160 lb 15 oz (73 kg)] 160 lb 15 oz (73 kg) (11/24 0503)  PHYSICAL EXAMINATION: General:  Chronically ill appearing male in NAD, anxious Neuro:  AAOx4, speech clear, MAE  HEENT:  MM pink/moist, no jvd  Cardiovascular:  s1s2 rrr, no m/r/g  Lungs:  Even/non-labored, lungs bilaterally distant but clear  Abdomen:  Obese/soft, bsx4 active  Musculoskeletal:  No acute deformities  Skin:  Warm/dry, no edema    Recent Labs Lab 09/16/16 0440 09/19/16 1158  NA 138 137  K 3.3* 4.0  CL 102 102  CO2 26 24  BUN 10 6  CREATININE 0.48* 0.54*  GLUCOSE 101* 201*     Recent Labs Lab 09/16/16 0440 09/19/16 1158  HGB 13.2 13.2  HCT 39.3 40.3  WBC 11.4* 10.3  PLT 160 257    No results found.    SIGNIFICANT EVENTS  11/17  Admit to CIR  11/21 Downsized to #4 Cuffless trach 11/25 Plan for capping trach trials to begin  STUDIES:      ASSESSMENT / PLAN:  Chronic Respiratory Failure s/p Tracheostomy  COPD  Interstitial Pulmonary Fibrosis  Tobacco Abuse  No further issues with coughing regarding trach  Plan: Progressing well: Capping  Trial  11/25 as planned, if tolerates x 48 hours, will de cannulate 11/27.  Minimal secretions  SLP efforts  for PMV, currently wearing 24/7  Intermittent CXR  Continue aggressive PT efforts  Will need out patient pulmonary follow up after D/C 11/30.    Bevelyn NgoSarah F. Tremell Reimers, AGACNP-BC [facility]monary/Critical [facility] Medicine [phone]: [phone] 09/22/2016, 8:59 AM

## 2016-09-22 NOTE — Progress Notes (Signed)
Physical Therapy Weekly Progress Note  Patient Details  Name: Bradley [name] MRN: [id number] Date of Birth: 12-Aug-1961  Beginning of progress report period: September 16, 2016 End of progress report period: September 22, 2016  Today's Date: 09/22/2016 PT Individual Time: 0800-0900 PT Individual Time Calculation (min): 60 min   Patient has met 3 of 4 short term goals.  Patient making excellent progress toward goals and currently requires close supervision for mobility using RW and for stairs with 2 rails to simulate home entry but remains limited by decreased activity tolerance and orthostatic hypotension.   Patient continues to demonstrate the following deficits: muscle weakness, decreased standing balance, decreased balance strategies, decreased cardiopulmonary endurance and therefore will continue to benefit from skilled PT intervention to enhance overall performance with activity tolerance, balance, postural control and ability to compensate for deficits.  Patient progressing toward long term goals.  Continue plan of care.  PT Short Term Goals Week 1:  PT Short Term Goal 1 (Week 1): Patient will be able to perform sit to stand with min A and maintain standing for up to 1 min w/o BP changes PT Short Term Goal 1 - Progress (Week 1): Partly met (orthostatic but able to transfer and stand with supervision) PT Short Term Goal 2 (Week 1): Patient will be able to perform transfer bed to w/c with min A and no LOB PT Short Term Goal 2 - Progress (Week 1): Met PT Short Term Goal 3 (Week 1): Patient will be able to complete gait training on adistance of 50 feet with LRAD with min A PT Short Term Goal 3 - Progress (Week 1): Met PT Short Term Goal 4 (Week 1): Patient will be able to navigate up and down 3 steps with one L side rail with mod A PT Short Term Goal 4 - Progress (Week 1): Met Week 2:  PT Short Term Goal 1 (Week 2): = LTGs due to anticipated LOS   Skilled Therapeutic  Interventions/Progress Updates:     Session focused on mobility training, generalized strengthening, activity tolerance, and standing tolerance. Patient propelled wheelchair to gym using BUE with mod I, performed gait training using RW x 75 ft + 50 ft with supervision, sit <> stand and transfers using RW with supervision, stair training up/down 4 (6") stairs using 2 rails with supervision, and NuStep using BLE only at level 3 x 7 minutes. Patient limited by decreased activity tolerance and orthostatic hypotension, seated BP 115/69 and standing BP 94/54 before stair negotiation, seated BP 89/62 after stairs increased to 102/53 after prolonged seated rest break, and seated BP 89/61 after NuStep with patient remaining symptomatic with c/o lightheadedness. Patient encouraged to stay hydrated and returned to bed with all needs within reach, nurse tech notified of orthostatic hypotension and asked to notify patient's RN.   Therapy Documentation Precautions:  Precautions Precautions: Shoulder, Other (comment), Fall Type of Shoulder Precautions: Hx of R-RTC repair 08/14/16 Shoulder Interventions: Shoulder sling/immobilizer Precaution Comments: trach  Restrictions Weight Bearing Restrictions: No General: PT Amount of Missed Time (min): 15 Minutes PT Missed Treatment Reason: Patient ill (Comment) (low BP) Vital Signs: Therapy Vitals Pulse Rate: 95 BP: (!) 89/62 Patient Position (if appropriate): Sitting Oxygen Therapy SpO2: 96 % O2 Device: Not Delivered Pain: Pain Assessment Pain Assessment: No/denies pain   See Function Navigator for Current Functional Status.  Therapy/Group: Individual Therapy  Laretta Alstrom 09/22/2016, 9:09 AM

## 2016-09-22 NOTE — Progress Notes (Signed)
RT called to patient room due to patient trach coming out while eating.  Upon arrival patient was not noted to be in any distress.  Had a good strong cough.  Sats were at 96%.  Per MD just place gauze over stoma and monitor closely through the weekend.

## 2016-09-22 NOTE — Progress Notes (Signed)
Speech Language Pathology Weekly Progress and Session Note  Patient Details  Name: Bradley [name] MRN: [id number] Date of Birth: January 10, 1961  Beginning of progress report period: September 15, 2016 End of progress report period: September 22, 2016  Today's Date: 09/22/2016 SLP Individual Time: 1015-1100 SLP Individual Time Calculation (min): 45 min   Short Term Goals: Week 1: SLP Short Term Goal 1 (Week 1): Pt will return demonstration of donning and doffing speaking valve with supervision verbal cues.  SLP Short Term Goal 1 - Progress (Week 1): Met SLP Short Term Goal 2 (Week 1): Pt will wear speaking valve with no changes in vitals or reports of discomfort for 60 minutes.   SLP Short Term Goal 2 - Progress (Week 1): Met SLP Short Term Goal 3 (Week 1): Pt will consume trials of dys 3 textures with mod I use of swallowing precautions over 2 consecutive sessions prior to advancement.   SLP Short Term Goal 3 - Progress (Week 1): Met SLP Short Term Goal 4 (Week 1): Pt will verbalize safety precautions for valve use with supervision question cues.  SLP Short Term Goal 4 - Progress (Week 1): Not met SLP Short Term Goal 5 (Week 1): Pt will complete semi-complex problem solving tasks with Min A verbal cues and 80% accuracy.  SLP Short Term Goal 5 - Progress (Week 1): Met SLP Short Term Goal 6 (Week 1): Pt will utilize external memory aids to recall new, daily information with Min A verbal cues.  SLP Short Term Goal 6 - Progress (Week 1): Met    New Short Term Goals: Week 2: SLP Short Term Goal 1 (Week 2): Pt will utilize external memory aids to recall new, daily information with supervision verbal cues.  SLP Short Term Goal 2 (Week 2): Pt will complete semi-complex problem solving tasks with supervision verbal cues and 90% accuracy.  SLP Short Term Goal 3 (Week 2): Pt will verbalize safety precautions for valve use with supervision question cues.  SLP Short Term Goal 4 (Week 2): Pt will return  demonstration of donning and doffing speaking valve with Mod I.  SLP Short Term Goal 5 (Week 2): Pt will consume trials of regular textures with mod I use of swallowing precautions over 2 consecutive sessions prior to advancement.    Weekly Progress Updates: Patient has made functional gains and has met 5 of 6 STG's this reporting period due to improved cognitive, swallowing and speech functioning. Currently, patient is consuming Dys. 3 textures with thin liquids with minimal overt s/s of aspiration and intermittent supervision for use of swallowing compensatory strategies. Patient currently has a #4 cuffless trach and is donning/doffing his PMSV with supervision verbal cues and is wearing his valve during all waking hours with all vitals remaining WFL. Patient also requires overall Min A verbal cues to complete mildly complex tasks in regards to recall and problem solving. Patient and family education is ongoing. Patient would benefit from continued skilled SLP intervention to maximize his swallowing and cognitive function and overall functional independence prior to discharge.    Intensity: Minumum of 1-2 x/day, 30 to 90 minutes Frequency: 3 to 5 out of 7 days Duration/Length of Stay: 14-21 days  Treatment/Interventions: Financial trader;Dysphagia/aspiration precaution training;Environmental controls;Internal/external aids;Patient/family education;Cognitive remediation/compensation;Therapeutic Activities;Functional tasks   Daily Session  Skilled Therapeutic Interventions: Skilled treatment session focused on cognitive goals. Upon arrival, patient's PMSV was in place and vitals remainined WFL throughout entire session. SLP facilitated session by providing total A for recall of  parameters for PMSV in regards to removing valve while sleeping. Patient reports he wears his PMSV 24 hours a day and feels comfortable sleeping with the PMSV on due to O2 saturations remaining WFL. Patient educated on why to  remove valve. Patient verbalized understanding but will need reinforcement. SLP also facilitated session by providing supervision-Min A verbal and question cues for problem solving during a novel, complex card task. Patient also required extra time to complete task. Patient left upright in wheelchair with PMSV in place and family present. Continue with current plan of care.     Function:   Cognition Comprehension Comprehension assist level: Follows complex conversation/direction with extra time/assistive device  Expression Expression assistive device: Talk trach valve Expression assist level: Expresses complex ideas: With extra time/assistive device  Social Interaction Social Interaction assist level: Interacts appropriately with others - No medications needed.  Problem Solving Problem solving assist level: Solves basic problems with no assist  Memory Memory assist level: Recognizes or recalls 90% of the time/requires cueing < 10% of the time   Pain Pain Assessment Pain Assessment: No/denies pain Pain Score: 0-No pain  Therapy/Group: Individual Therapy  Nala Kachel 09/22/2016, 11:48 AM

## 2016-09-23 ENCOUNTER — Inpatient Hospital Stay (HOSPITAL_COMMUNITY): Payer: BLUE CROSS/BLUE SHIELD | Admitting: Occupational Therapy

## 2016-09-23 ENCOUNTER — Inpatient Hospital Stay (HOSPITAL_COMMUNITY): Payer: BLUE CROSS/BLUE SHIELD

## 2016-09-23 LAB — GLUCOSE, CAPILLARY
GLUCOSE-CAPILLARY: 125 mg/dL — AB (ref 65–99)
GLUCOSE-CAPILLARY: 236 mg/dL — AB (ref 65–99)
Glucose-Capillary: 91 mg/dL (ref 65–99)
Glucose-Capillary: 162 mg/dL — ABNORMAL HIGH (ref 65–99)

## 2016-09-23 NOTE — Progress Notes (Signed)
Occupational Therapy Session Note  Patient Details  Name: Bradley FraiseDavid M Slyter MRN: [id number] Date of Birth: [birth year]  Today's Date: 09/23/2016 OT Individual Time: 1100-1200 OT Individual Time Calculation (min): 60 min   Short Term Goals: Week 1:  OT Short Term Goal 1 (Week 1): Complete toilet transfer with min assist OT Short Term Goal 2 (Week 1): Complete upper body bathing seated at EOB or sink, with mod assist OT Short Term Goal 3 (Week 1): Complete lower body bathing with mod assist using AE, prn OT Short Term Goal 4 (Week 1): Groom seated with mod assist OT Short Term Goal 5 (Week 1): Maintain supported standing balance for 2 minutes during assisted dressing  Skilled Therapeutic Interventions/Progress Updates:   ADL-retraining with focus on transfers (toilet) and adapted BADL with improved adherence to RTC repair precautions.   Pt received seated in w/c and requesting to use toilet.  With setup place w/c, pt performed stand-pivot transfer to sink with min vc to avoid pushing or pulling with right UE.   Pt required setup for TEDS due to inability to tolerate using of ace wrap to manage BP.   Pt was then escorted to gym and instructed in PROM and SROM HEP for right shoulder.   During treatment pt was re-educated on need to maintain use of sling as directed and required rest breaks while performing pendulum exercises standing supported due to low back pain and fatigue.  HR mildly elevated during therex (104 bpm) with SOB requiring rest break of 5 minutes however pt recovered and continued through activity with good participation and no further complaints.   Written HEP provided and placed in Patient Handbook.    Therapy Documentation  Precautions:  Precautions Precautions: Shoulder, Other (comment), Fall Type of Shoulder Precautions: Hx of R-RTC repair 08/14/16 Shoulder Interventions: Shoulder sling/immobilizer Precaution Comments: trach  Restrictions Weight Bearing Restrictions: No    Vital Signs: Therapy Vitals Pulse Rate: 93 Resp: 18 BP: 109/75 Oxygen Therapy SpO2: 95 % O2 Device: Not Delivered   Pain: Pain Assessment Pain Assessment: No/denies pain Pain Score: 0-No pain Pain Intervention(s): Medication (See eMAR) (eforetherapy)   ADL: ADL ADL Comments: See Functional Assessment Tool   Exercises: General Exercises - Upper Extremity Shoulder Flexion: 10 reps;PROM;Seated;Right Shoulder Extension: 10 reps;PROM;Seated;Right Shoulder ABduction: 10 reps;PROM;Seated;Right Elbow Flexion: AROM;10 reps;Right Elbow Extension: AROM;10 reps;Right Shoulder Exercises Pendulum Exercise: 10 reps;Right   See Function Navigator for Current Functional Status.   Therapy/Group: Individual Therapy  Jessejames Steelman 09/23/2016, 12:52 PM

## 2016-09-23 NOTE — Progress Notes (Signed)
Occupational Therapy Session Note  Patient Details  Name: Bradley FraiseDavid M [name] MRN: [id number] Date of Birth: 30-Jul-1961  Today's Date: 09/23/2016 OT Individual [phone] and [id number]   OT Individual Time Calculation (min): 76 min and 59 min  Short Term Goals: Week 1:  OT Short Term Goal 1 (Week 1): Complete toilet transfer with min assist OT Short Term Goal 2 (Week 1): Complete upper body bathing seated at EOB or sink, with mod assist OT Short Term Goal 3 (Week 1): Complete lower body bathing with mod assist using AE, prn OT Short Term Goal 4 (Week 1): Groom seated with mod assist OT Short Term Goal 5 (Week 1): Maintain supported standing balance for 2 minutes during assisted dressing      Skilled Therapeutic Interventions/Progress Updates:   Pt was sitting up in w/c at time of arrival, had just finished breakfast. Pt declined shower but wanted to dress after toileting. Toilet transfer completed with RW and supervision to bathroom. UB/LB dressing completed with overall supervision and instruction on safety with use of walker. ACE wrapping completed for bilateral LEs for edema management. Afterwards pt ambulated to kitchen with no c/o dizziness, RW and supervision. Pt educated on walker safety, kitchen modifications, and energy conservation strategies. Pt then able to demonstrate carryover of education during simulated meal prep task. Afterwards pt ambulated back to room, required cues for pathfinding. Pt left in room with all needs within reach and LEs elevated on bed (R UE sling donned with much encouragement), RT present.   2nd Session 1:1 tx (59 min)  Skilled OT session completed with focus on R UE strengthening, activity tolerance, and standing endurance. Pt was sitting up with son Bradley [name] at time of arrival. Pt reported wanting to go outside. Due to low BP at start of tx, nursing notified. Pt asymptomatic. Prior to going outside, pt donned shoes (refused wearing elastic shoe laces and using  hemi technique for donning footwear "I've always used this hand before, I'm going to keep doing it.") and completed toilet transfer and toileting with use of RW. Afterward pt ambulated with [facility] to [employer] [facility] and supervision w/c follow. Pt then reported feeling dizzy and sat down. Pt self propelled with left arm and bilateral LEs (with R UE in sling) partway down north tower when dizziness subsided. Once outdoors, pt self propelled around tables, tight spaces, and navigated up/down inclines. Pt exhibited signs of frustration when faced with upward inclines and required encouragement to complete. Pt then tolerated PROM of R UE x10 reps in all planes with instruction on allowing therapist to perform exercises without active assist. Afterwards pt self propelled partway back to room in manner as written above, able to pathfind his way back to unit with mod vcs. At end of session pt was left with his son and all needs within reach, R UE sling donned and LEs elevated on bed. Nursing made aware of pts vitals which are recorded below.   Therapy Documentation Precautions:  Precautions Precautions: Shoulder, Other (comment), Fall Type of Shoulder Precautions: Hx of R-RTC repair 08/14/16 Shoulder Interventions: Shoulder sling/immobilizer Precaution Comments: trach  Restrictions Weight Bearing Restrictions: No General:   Vital Signs:  Session 1 BP: 114/65 (at rest); 109/65 (post dressing); 104/72 (after ambulating to kitchen, pt asymptomatic); 109/64 (after ambulating back to room) 02 sats: 97% (at rest); 97% (post dressing); 96% (post ambulating to kitchen); 95% (after ambulating back to room)  Session 2 BP: 98/54 (at rest); 113/70 (outside); 97/66 (back in room) 02 sats:  96% (at rest); 97% (outside); (96% (back in room) Therapy Vitals Pulse Rate: 93 Resp: 18 BP: 109/75 Oxygen Therapy SpO2: 95 % O2 Device: Not Delivered Pain: Pt medicated prior to OT session Pain Assessment Pain Assessment:  No/denies pain Pain Score: 0-No pain Pain Intervention(s): Medication (See eMAR) (eforetherapy) ADL: ADL ADL Comments: See Functional Assessment Tool Exercises: General Exercises - Upper Extremity Shoulder Flexion: 10 reps;PROM;Seated;Right Shoulder Extension: 10 reps;PROM;Seated;Right Shoulder ABduction: 10 reps;PROM;Seated;Right Elbow Flexion: AROM;10 reps;Right Elbow Extension: AROM;10 reps;Right Shoulder Exercises Pendulum Exercise: 10 reps;Right Other Treatments:    See Function Navigator for Current Functional Status.  Therapy/Group: Individual Therapy  [name] A [name] 09/23/2016, 12:53 PM

## 2016-09-23 NOTE — Progress Notes (Signed)
[place] PHYSICAL MEDICINE & REHABILITATION     PROGRESS NOTE    Subjective/Complaints: Pt seen sitting up in his chair this AM.  He slept well overnight.  Overnight, self-decannulated. Pt states it was on accident and he does not even know how it happened.  He has not had an issues.    ROS: Denies CP, SOB, N/V/D.  Objective: Vital Signs: Blood pressure 114/65, pulse 93, temperature 98.5 F (36.9 C), temperature source Oral, resp. rate 18, height 5\' 9"  (1.753 m), weight 73.1 kg (161 lb 2.5 oz), SpO2 95 %. No results found. No results for input(s): WBC, HGB, HCT, PLT in the last 72 hours. No results for input(s): NA, K, CL, GLUCOSE, BUN, CREATININE, CALCIUM in the last 72 hours.  Invalid input(s): CO CBG (last 3)   Recent Labs  09/22/16 1623 09/22/16 2037 09/23/16 0629  GLUCAP 111* 115* 91    Wt Readings from Last 3 Encounters:  09/23/16 73.1 kg (161 lb 2.5 oz)  09/14/16 79.5 kg (175 lb 3.2 oz)  08/14/16 90.7 kg (200 lb)    Physical Exam:  Constitutional: No distress. Vital signs reviewed. Well-developed.  HENT:  [place]. NT.  Eyes: EOMI. No discharge.  Neck: +Stoma. Cardiovascular: RRR. No JVD. Respiratory: CTA b/l. Unlabored GI: BS+, NT/ND Musculoskeletal: He exhibits no edemaor tenderness.  Neurological: He is alertand oriented.  Motor: 4+/5 throughout, except right shoulder limited  Skin: Skin is warmand dry. He is not diaphoretic.  Psychiatric: pleasant. cooperative   Assessment/Plan: 1. Functional deficits secondary to debility and anoxic encephalopathy which require 3+ hours per day of interdisciplinary therapy in a comprehensive inpatient rehab setting. Physiatrist is providing close team supervision and 24 hour management of active medical problems listed below. Physiatrist and rehab team continue to assess barriers to discharge/monitor patient progress toward functional and medical goals.  Function:  Bathing Bathing position   Position: Shower   Bathing parts Body parts bathed by patient: Right arm, Left arm, Chest, Abdomen, Front perineal area, Right upper leg, Left upper leg, Right lower leg, Left lower leg, Buttocks Body parts bathed by helper: Back  Bathing assist Assist Level: Touching or steadying assistance(Pt > 75%)      Upper Body Dressing/Undressing Upper body dressing   What is the patient wearing?: Pull over shirt/dress     Pull over shirt/dress - Perfomed by patient: Thread/unthread right sleeve, Thread/unthread left sleeve, Put head through opening, Pull shirt over trunk Pull over shirt/dress - Perfomed by helper: Put head through opening Button up shirt - Perfomed by patient: Thread/unthread right sleeve, Thread/unthread left sleeve Button up shirt - Perfomed by helper: Pull shirt around back, Button/unbutton shirt    Upper body assist Assist Level: Supervision or verbal cues      Lower Body Dressing/Undressing Lower body dressing   What is the patient wearing?: Underwear, Pants, Shoes Underwear - Performed by patient: Thread/unthread right underwear leg, Thread/unthread left underwear leg, Pull underwear up/down Underwear - Performed by helper: Pull underwear up/down, Thread/unthread left underwear leg Pants- Performed by patient: Thread/unthread right pants leg, Thread/unthread left pants leg, Pull pants up/down Pants- Performed by helper: Pull pants up/down   Non-skid slipper socks- Performed by helper: Don/doff right sock, Don/doff left sock (tight socks- looser ones may be more successful and less tiring)     Shoes - Performed by patient: Don/doff right shoe, Don/doff left shoe            Lower body assist Assist for lower body dressing: Touching or  steadying assistance (Pt > 75%)      Toileting Toileting   Toileting steps completed by patient: Adjust clothing prior to toileting, Performs perineal hygiene, Adjust clothing after toileting Toileting steps completed by helper: Adjust clothing prior  to toileting Toileting Assistive Devices: Grab bar or rail  Toileting assist Assist level: Supervision or verbal cues   Transfers Chair/bed transfer   Chair/bed transfer method: Ambulatory Chair/bed transfer assist level: Supervision or verbal cues Chair/bed transfer assistive device: Armrests, Walker Mechanical lift: Landscape architecttedy   Locomotion Ambulation Ambulation activity did not occur: Safety/medical concerns   Max distance: 75 ft Assist level: Supervision or verbal cues   Wheelchair   Type: Manual Max wheelchair distance: 150 Assist Level: No help, No cues, assistive device, takes more than reasonable amount of time  Cognition Comprehension Comprehension assist level: Follows complex conversation/direction with extra time/assistive device  Expression Expression assist level: Expresses basic needs/ideas: With extra time/assistive device  Social Interaction Social Interaction assist level: Interacts appropriately with others - No medications needed.  Problem Solving Problem solving assist level: Solves basic problems with no assist  Memory Memory assist level: Recognizes or recalls 90% of the time/requires cueing < 10% of the time   Medical Problem List and Plan: 1. Functional, mobility and cognitive deficits secondary to debility/?anoxic encephalopathy with recent history of right rotator cuff repair -continue CIR therapies 2. DVT Prophylaxis/Anticoagulation: Pharmaceutical: Lovenox 3. Pain Management: Added low dose Neurontin to help with neuropathy BUE.     used ibuprofen daily for myalgias at home.  4. Mood: Team to provide ego support. LCSW to follow for evaluation and support.  5. Neuropsych: This patient is not fully capable of making decisions on hisown behalf. 6. Skin/Wound Care: routine trach care. Continue airmatress overlay for pressure relief measures.  7. Fluids/Electrolytes/Nutrition: push po  D3 thin diet, will advance as tolerated  K+ 3.4 11/21,  repleting  8. VDRF:   Self-decanulated 11/24, did not replace, monitored closely overnight as plan was to cap today  Cont to monitor 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 Anxiety:   -now on celexa  -xanax prn for breakthrough symptoms. 11. DM type 2:   Hgb A1C 6.9.  Lantus increased to 22U on 11/22  Will monitor BS ac/hs and use SSI as well.   Continues to improve  Will cont to monitor 12. Leukocytosis  Likely related to steroids  WBCs 10.3 11/21  LOS (Days) 8 A FACE TO FACE EVALUATION WAS PERFORMED  Queena Monrreal Karis Jubanil Kery Batzel 09/23/2016 10:38 AM

## 2016-09-24 ENCOUNTER — Inpatient Hospital Stay (HOSPITAL_COMMUNITY): Payer: BLUE CROSS/BLUE SHIELD | Admitting: Speech Pathology

## 2016-09-24 ENCOUNTER — Inpatient Hospital Stay (HOSPITAL_COMMUNITY): Payer: BLUE CROSS/BLUE SHIELD | Admitting: Physical Therapy

## 2016-09-24 DIAGNOSIS — R609 Edema, unspecified: Secondary | ICD-10-CM

## 2016-09-24 LAB — GLUCOSE, CAPILLARY
GLUCOSE-CAPILLARY: 177 mg/dL — AB (ref 65–99)
GLUCOSE-CAPILLARY: 161 mg/dL — AB (ref 65–99)
Glucose-Capillary: 112 mg/dL — ABNORMAL HIGH (ref 65–99)
Glucose-Capillary: 157 mg/dL — ABNORMAL HIGH (ref 65–99)

## 2016-09-24 MED ORDER — FUROSEMIDE 20 MG PO TABS
20.0000 mg | ORAL_TABLET | Freq: Every day | ORAL | Status: DC
  Administered 2016-09-24 – 2016-09-28 (×5): 20 mg via ORAL
  Filled 2016-09-24 (×5): qty 1

## 2016-09-24 NOTE — Progress Notes (Signed)
Physical Therapy Session Note  Patient Details  Name: Bradley [name] MRN: [id number] Date of Birth: September 05, 1961  Today's Date: 09/24/2016 PT Individual Time: [id number] PT Individual Time Calculation (min): 28 min   Skilled Therapeutic Interventions/Progress Updates:    Pt received in w/c & agreeable to tx, denying c/o pain. Pt ambulated 10 ft + 7 ft within room to bathroom with RW & supervision; pt performed toilet transfers & hand hygiene standing at sink with supervision. Pt reported need to wear sling & donned it himself. Pt propelled w/c room<>gym with BLE & supervision with 1 rest break 2/2 BLE fatigue. Educated pt on stair negotiation laterally with 1UE support and pt able to return demonstrate x 4 steps (6") with single rail & mod assist. Pt noted task felt "awkard" and he was supporting himself with his non dominant hand. Pt utilized cybex kinetron in sitting, up to 20 cm/sec, for BLE Strengthening. Pt propelled w/c back to room in same manner as noted above. At end of session pt left sitting in w/c in room with all needs within reach.  During session pt required instructions to take rest breaks to allow HR to decrease, as pt will continue to push himself.   During session HR = 115-127 bpm, SpO2 = 94-96% on room air.   Therapy Documentation Precautions:  Precautions Precautions: Shoulder, Other (comment), Fall Type of Shoulder Precautions: Hx of R-RTC repair 08/14/16 Shoulder Interventions: Shoulder sling/immobilizer Precaution Comments: trach  Restrictions Weight Bearing Restrictions: No    See Function Navigator for Current Functional Status.   Therapy/Group: Individual Therapy  Sandi MariscalVictoria M Kavon Valenza 09/24/2016, 2:33 PM

## 2016-09-24 NOTE — Progress Notes (Signed)
[place] PHYSICAL MEDICINE & REHABILITATION     PROGRESS NOTE    Subjective/Complaints: Pt sitting up in his chair this AM.  He states he slept really well overnight.  He has questions about upcoming discharge.   ROS: Denies CP, SOB, N/V/D.  Objective: Vital Signs: Blood pressure 119/72, pulse (!) 114, temperature 98.1 F (36.7 C), temperature source Oral, resp. rate 18, height 5\' 9"  (1.753 m), weight 82.3 kg (181 lb 6.4 oz), SpO2 96 %. No results found. No results for input(s): WBC, HGB, HCT, PLT in the last 72 hours. No results for input(s): NA, K, CL, GLUCOSE, BUN, CREATININE, CALCIUM in the last 72 hours.  Invalid input(s): CO CBG (last 3)   Recent Labs  09/23/16 2052 09/24/16 0648 09/24/16 1137  GLUCAP 162* 112* 157*    Wt Readings from Last 3 Encounters:  09/24/16 82.3 kg (181 lb 6.4 oz)  09/14/16 79.5 kg (175 lb 3.2 oz)  08/14/16 90.7 kg (200 lb)    Physical Exam:  Constitutional: NAD. Vital signs reviewed. Well-developed.  HENT:  [place]. NT.  Eyes: EOMI. No discharge.  Neck: +Stoma. Cardiovascular: RRR. No JVD. Respiratory: CTA b/l. Unlabored. GI: BS+, NT/ND Musculoskeletal: He exhibits no edemaor tenderness. +LE edema. Neurological: He is alertand oriented.  Motor: 4+/5 throughout, except right shoulder limited but improving Skin: Skin is warmand dry. He is not diaphoretic.  Psychiatric: pleasant. cooperative   Assessment/Plan: 1. Functional deficits secondary to debility and anoxic encephalopathy which require 3+ hours per day of interdisciplinary therapy in a comprehensive inpatient rehab setting. Physiatrist is providing close team supervision and 24 hour management of active medical problems listed below. Physiatrist and rehab team continue to assess barriers to discharge/monitor patient progress toward functional and medical goals.  Function:  Bathing Bathing position   Position: Shower  Bathing parts Body parts bathed by patient: Right  arm, Left arm, Chest, Abdomen, Front perineal area, Right upper leg, Left upper leg, Right lower leg, Left lower leg, Buttocks Body parts bathed by helper: Back  Bathing assist Assist Level: Touching or steadying assistance(Pt > 75%)      Upper Body Dressing/Undressing Upper body dressing   What is the patient wearing?: Pull over shirt/dress     Pull over shirt/dress - Perfomed by patient: Thread/unthread right sleeve, Thread/unthread left sleeve, Put head through opening, Pull shirt over trunk Pull over shirt/dress - Perfomed by helper: Put head through opening Button up shirt - Perfomed by patient: Thread/unthread right sleeve, Thread/unthread left sleeve Button up shirt - Perfomed by helper: Pull shirt around back, Button/unbutton shirt    Upper body assist Assist Level: Supervision or verbal cues      Lower Body Dressing/Undressing Lower body dressing   What is the patient wearing?: Underwear, Pants, Non-skid slipper socks Underwear - Performed by patient: Thread/unthread right underwear leg, Thread/unthread left underwear leg, Pull underwear up/down Underwear - Performed by helper: Pull underwear up/down, Thread/unthread left underwear leg Pants- Performed by patient: Thread/unthread right pants leg, Thread/unthread left pants leg, Pull pants up/down Pants- Performed by helper: Pull pants up/down   Non-skid slipper socks- Performed by helper: Don/doff right sock, Don/doff left sock     Shoes - Performed by patient: Don/doff right shoe, Don/doff left shoe            Lower body assist Assist for lower body dressing: Supervision or verbal cues      Toileting Toileting   Toileting steps completed by patient: Adjust clothing prior to toileting, Adjust clothing after  toileting Toileting steps completed by helper: Adjust clothing prior to toileting Toileting Assistive Devices: Grab bar or rail  Toileting assist Assist level: Supervision or verbal cues   Transfers Chair/bed  transfer   Chair/bed transfer method: Ambulatory Chair/bed transfer assist level: Supervision or verbal cues Chair/bed transfer assistive device: Armrests, Walker Mechanical lift: Landscape architecttedy   Locomotion Ambulation Ambulation activity did not occur: Safety/medical concerns   Max distance: 10 ft Assist level: Supervision or verbal cues   Wheelchair   Type: Manual Max wheelchair distance: 100 ft (BLE) Assist Level: Supervision or verbal cues  Cognition Comprehension Comprehension assist level: Follows complex conversation/direction with extra time/assistive device  Expression Expression assist level: Expresses complex ideas: With extra time/assistive device  Social Interaction Social Interaction assist level: Interacts appropriately with others with medication or extra time (anti-anxiety, antidepressant).  Problem Solving Problem solving assist level: Solves basic problems with no assist  Memory Memory assist level: Recognizes or recalls 90% of the time/requires cueing < 10% of the time   Medical Problem List and Plan: 1. Functional, mobility and cognitive deficits secondary to debility/?anoxic encephalopathy with recent history of right rotator cuff repair -continue CIR 2. DVT Prophylaxis/Anticoagulation: Pharmaceutical: Lovenox 3. Pain Management: Added low dose Neurontin to help with neuropathy BUE.     used ibuprofen daily for myalgias at home.  4. Mood: Team to provide ego support. LCSW to follow for evaluation and support.  5. Neuropsych: This patient is not fully capable of making decisions on hisown behalf. 6. Skin/Wound Care: routine trach care. Continue airmatress overlay for pressure relief measures.  7. Fluids/Electrolytes/Nutrition: push po  D3 thin diet, will advance as tolerated  K+ 3.4 11/21, repleting  8. VDRF:   Self-decanulated 11/24, did not replace, no issues  Cont to monitor 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 Anxiety:   -now on  celexa  -xanax prn for breakthrough symptoms. 11. DM type 2:   Hgb A1C 6.9.  Lantus increased to 22U on 11/22  Will monitor BS ac/hs and use SSI as well.   Relatively controlled 11/26  Will cont to monitor 12. Leukocytosis  Likely related to steroids  WBCs 10.3 11/21 13. LE edema  Echo reviewed, EF WNL  Lasix 20mg  started 11/26  LOS (Days) 9 A FACE TO FACE EVALUATION WAS PERFORMED  Sheketa Ende Karis Jubanil Ahnyla Mendel 09/24/2016 2:36 PM

## 2016-09-24 NOTE — Progress Notes (Signed)
Speech Language Pathology Daily Session Note  Patient Details  Name: Bradley FraiseDavid M Keelan MRN: [id number] Date of Birth: 12-30-1960  Today's Date: 09/24/2016 SLP Individual Time: 1510-1540 SLP Individual Time Calculation (min): 30 min   Short Term Goals:Week 2: SLP Short Term Goal 1 (Week 2): Pt will utilize external memory aids to recall new, daily information with supervision verbal cues.  SLP Short Term Goal 2 (Week 2): Pt will complete semi-complex problem solving tasks with supervision verbal cues and 90% accuracy.  SLP Short Term Goal 3 (Week 2): Pt will verbalize safety precautions for valve use with supervision question cues.  SLP Short Term Goal 4 (Week 2): Pt will return demonstration of donning and doffing speaking valve with Mod I.  SLP Short Term Goal 5 (Week 2): Pt will consume trials of regular textures with mod I use of swallowing precautions over 2 consecutive sessions prior to advancement.    Skilled Therapeutic Interventions: Skilled treatment session focused on addressing dysphagia goals. SLP facilitated session by providing set-up of regular textures and thin liquids via straw.  Patient demonstrated effective mastication and oral clearance with intermittent use of thin liquids to assist with complete clearance.  Patient demonstrated no overt s/s of aspiration throughout session.  Additionally, patient reported having peanut butter crackers yesterday.  As a result, recommend patient initiate a diet upgrade to regular textures and thin with continuation of intermittent supervision.  Recommend meal supervision at next visit.   Function:  Eating Eating   Modified Consistency Diet: No Eating Assist Level: Set up assist for   Eating Set Up Assist For: Opening containers       Cognition Comprehension Comprehension assist level: Follows complex conversation/direction with extra time/assistive device  Expression   Expression assist level: Expresses complex ideas: With extra  time/assistive device  Social Interaction Social Interaction assist level: Interacts appropriately with others - No medications needed.  Problem Solving Problem solving assist level: Solves basic problems with no assist  Memory Memory assist level: Recognizes or recalls 90% of the time/requires cueing < 10% of the time    Pain Pain Assessment Pain Assessment: No/denies pain  Therapy/Group: Individual Therapy  Charlane FerrettiMelissa Raha Tennison, M.A., CCC-SLP [phone]  Serenna Deroy 09/24/2016, 3:42 PM

## 2016-09-25 ENCOUNTER — Inpatient Hospital Stay (HOSPITAL_COMMUNITY): Payer: BLUE CROSS/BLUE SHIELD | Admitting: Occupational Therapy

## 2016-09-25 ENCOUNTER — Inpatient Hospital Stay (HOSPITAL_COMMUNITY): Payer: BLUE CROSS/BLUE SHIELD | Admitting: Speech Pathology

## 2016-09-25 ENCOUNTER — Inpatient Hospital Stay (HOSPITAL_COMMUNITY): Payer: BLUE CROSS/BLUE SHIELD | Admitting: Physical Therapy

## 2016-09-25 LAB — GLUCOSE, CAPILLARY
GLUCOSE-CAPILLARY: 94 mg/dL (ref 65–99)
GLUCOSE-CAPILLARY: 135 mg/dL — AB (ref 65–99)
Glucose-Capillary: 173 mg/dL — ABNORMAL HIGH (ref 65–99)
Glucose-Capillary: 149 mg/dL — ABNORMAL HIGH (ref 65–99)

## 2016-09-25 NOTE — Progress Notes (Signed)
Occupational Therapy Session Note  Patient Details  Name: Bradley [name] MRN: [id number] Date of Birth: [birth year]  Today's Date: 09/25/2016  Session 1 OT Individual Time: 1100-1200 OT Individual Time Calculation (min): 60 min   Session 2 OT Individual Time: 1500-1530 OT Individual Time Calculation (min): 30 min    Short Term Goals: Week 1:  OT Short Term Goal 1 (Week 1): Complete toilet transfer with min assist OT Short Term Goal 2 (Week 1): Complete upper body bathing seated at EOB or sink, with mod assist OT Short Term Goal 3 (Week 1): Complete lower body bathing with mod assist using AE, prn OT Short Term Goal 4 (Week 1): Groom seated with mod assist OT Short Term Goal 5 (Week 1): Maintain supported standing balance for 2 minutes during assisted dressing  Skilled Therapeutic Interventions/Progress Updates:    Session 1 OT session focused on energy conservation during ADL tasks, functional transfers, and modified bathing/dressing. Pt fatigued after recent completion of PT session, requiring 5 minute seated rest break, then ambulated to the bathroom, transferred on/off toilet, then ambulated to shower with close supervision. OT covered trach site with shower shield, then pt able to  complete all bathing tasks with supervision and set-up A, w/ cues for energy conservation. Pt required multiple rest breaks throughout session and utilized deep breathing techniques appropriately. HR flucuated from 120-130 depending on exertion level of activity. Pt left seated in wc at end of session with needs met.   Session 2 1:1 OT session focused on R shoulder there-ex. Pt ambulated with cane to day room with one standing rest break and overall min guard A. Pt completed standing pendulums, clockwise/counterclockwise x10- pt reported back pain and increased fatigue with standing. OT demonstrated seated pendulums and pt completed 10x clockwise/counterclockwise with less pain. Pt then came to supine and OT  educated on use of cane for independent passive external rotation x10 + passive shoulder forward flexion x10. Pt able to achieve ~120 in gravity eliminated position. OT educated pt on hand-over hand technique using L UE to passively achieve R shoulder forward flexion. Pt able to complete 2x, then reported max fatigue on L Ue.  Pt came to sitting and completed table walking and active elbow flex/ext. OT propelled pt back to room in w/c for time management. Pt left seated in wc at end of session with needs met.   Therapy Documentation Precautions:  Precautions Precautions: Shoulder, Other (comment), Fall Type of Shoulder Precautions: Hx of R-RTC repair 08/14/16 Shoulder Interventions: Shoulder sling/immobilizer Precaution Comments: trach  Restrictions Weight Bearing Restrictions: No Pain: Pain Assessment Pain Assessment: 0-10 Pain Score: 2  Pain Type: Acute pain Pain Location: Leg Pain Orientation: Right;Left Pain Descriptors / Indicators: Aching Pain Onset: On-going Patients Stated Pain Goal: 2 Pain Intervention(s): Repositioned;RN made aware ADL: ADL ADL Comments: See Functional Assessment Tool  See Function Navigator for Current Functional Status.   Therapy/Group: Individual Therapy  Valma Cava 09/25/2016, 3:36 PM

## 2016-09-25 NOTE — Progress Notes (Signed)
Physical Therapy Session Note  Patient Details  Name: Bradley [name] MRN: [id number] Date of Birth: 07-28-1961  Today's Date: 09/25/2016 PT Individual Time: [id number] PT Individual Time Calculation (min): 57 min   Short Term Goals: Week 2:  PT Short Term Goal 1 (Week 2): = LTGs due to anticipated LOS     Skilled Therapeutic Interventions/Progress Updates:  Pt received in w/c requesting to use bathroom.  Pt performed sit > stand with supervision and began to attempt to use RW with one UE and then attempted to hold with RUE in sling; removed UE from sling and ambulated to toilet with RW and supervision and performed toileting tasks with supervision.  When returned to w/c pt reporting pain in shoulder from taking UE in and out of sling.  Discussed with pt if he has attempted gait with cane; pt states he has been using RW.  Performed w/c mobility to gym with bilat LE propulsion and one rest break due to LE fatigue Mod I.  Performed assessment of vitals with HR at 120 at baseline and ranging from 130-140 with activity; BP 115/88.  Performed BERG balance assessment as below.  Pt required multiple seated rest breaks due to fatigue and elevated HR.  Educated pt on falls risk and gait sequence with SPC.  Performed gait x 30' with SPC in straight path with min A and then again with quad cane to assess difference in balance.  Pt felt more comfortable with SPC but stated it was much less stable than RW; pt would like to continue to train with SPC.  Returned to room in w/c and pt handed off to OT for B&D session.  Therapy Documentation Precautions:  Precautions Precautions: Shoulder, Other (comment), Fall Type of Shoulder Precautions: Hx of R-RTC repair 08/14/16 Shoulder Interventions: Shoulder sling/immobilizer Precaution Comments: trach  Restrictions Weight Bearing Restrictions: No Vital Signs: Therapy Vitals Pulse Rate: (!) 120 BP: 127/87 Patient Position (if appropriate): Sitting Oxygen  Therapy SpO2: 96 % Pain: Pain Assessment Pain Assessment: No/denies pain Balance: Standardized Balance Assessment Standardized Balance Assessment: Berg Balance Test Berg Balance Test Sit to Stand: Able to stand without using hands and stabilize independently Standing Unsupported: Able to stand safely 2 minutes Sitting with Back Unsupported but Feet Supported on Floor or Stool: Able to sit safely and securely 2 minutes Stand to Sit: Sits safely with minimal use of hands Transfers: Able to transfer safely, minor use of hands Standing Unsupported with Eyes Closed: Able to stand 10 seconds with supervision Standing Ubsupported with Feet Together: Able to place feet together independently and stand 1 minute safely From Standing, Reach Forward with Outstretched Arm: Can reach forward >12 cm safely (5") From Standing Position, Pick up Object from Floor: Able to pick up shoe, needs supervision From Standing Position, Turn to Look Behind Over each Shoulder: Looks behind from both sides and weight shifts well Turn 360 Degrees: Able to turn 360 degrees safely but slowly Standing Unsupported, Alternately Place Feet on Step/Stool: Able to complete 4 steps without aid or supervision Standing Unsupported, One Foot in Front: Able to take small step independently and hold 30 seconds Standing on One Leg: Able to lift leg independently and hold 5-10 seconds Total Score: 46 Patient demonstrates increased fall risk as noted by score of 46/56 on Berg Balance Scale.  (<36= high risk for falls, close to 100%; 37-45 significant >80%; 46-51 moderate >50%; 52-55 lower >25%)   See Function Navigator for Current Functional Status.   Therapy/Group: Individual Therapy  Edman CircleHall, Katricia Prehn Faucette 09/25/2016, 11:08 AM

## 2016-09-25 NOTE — Progress Notes (Signed)
Speech Language Pathology Daily Session Note  Patient Details  Name: Bradley [name] MRN: [id number] Date of Birth: May 18, 1961  Today's Date: 09/25/2016 SLP Individual Time: 0830-0930 SLP Individual Time Calculation (min): 60 min   Short Term Goals: Week 2: SLP Short Term Goal 1 (Week 2): Pt will utilize external memory aids to recall new, daily information with supervision verbal cues.  SLP Short Term Goal 2 (Week 2): Pt will complete semi-complex problem solving tasks with supervision verbal cues and 90% accuracy.  SLP Short Term Goal 3 (Week 2): Pt will verbalize safety precautions for valve use with supervision question cues.  SLP Short Term Goal 4 (Week 2): Pt will return demonstration of donning and doffing speaking valve with Mod I.  SLP Short Term Goal 5 (Week 2): Pt will consume trials of regular textures with mod I use of swallowing precautions over 2 consecutive sessions prior to advancement.    Skilled Therapeutic Interventions:Skilled therapy intervention focused on cognitive goals. Patient had just completed breakfast at start of therapy session, therefore patient declined trials of regular textures. When given a medication management task, patient independently recalled about 50% of medications and their function. Patient required supervision verbal cues from problem solving of medication task, fading to independent. Given a mildly complex scheduling task, patient required supervision verbal cues for clarification of directions. Patient exhibited anticipatory awareness of safety needs, physical limitations, and possible cognitive limitations given supervision questions cues. Patient left upright in wheelchair with all needs within reach. Continue current plan of care.    Function:  Cognition Comprehension Comprehension assist level: Follows complex conversation/direction with no assist  Expression   Expression assist level: Expresses complex ideas: With no assist  Social  Interaction Social Interaction assist level: Interacts appropriately with others with medication or extra time (anti-anxiety, antidepressant).  Problem Solving Problem solving assist level: Solves complex 90% of the time/cues < 10% of the time  Memory Memory assist level: Recognizes or recalls 90% of the time/requires cueing < 10% of the time    Pain Pain Assessment Pain Assessment: No/denies pain  Therapy/Group: Individual Therapy  Caryn Sectionlison Jasher Barkan 09/25/2016, 11:38 AM

## 2016-09-25 NOTE — Progress Notes (Signed)
Refused scheduled miralax and labetalol Saturday and Sunday night. Patient feels labetalol is causing his BP to drop when standing up. Tachy. BLE's with pitting edema, left > right. Alfredo MartinezMurray, Keonta Monceaux A

## 2016-09-25 NOTE — Progress Notes (Signed)
[facility] PHYSICAL MEDICINE & REHABILITATION     PROGRESS NOTE    Subjective/Complaints: Pt siting up in his chair.  He is in good spirits, ready to begin therapies.   ROS: Denies CP, SOB, N/V/D.  Objective: Vital Signs: Blood pressure 128/78, pulse (!) 102, temperature 98.4 F (36.9 C), temperature source Oral, resp. rate 18, height 5\' 9"  (1.753 m), weight 79.1 kg (174 lb 6.1 oz), SpO2 98 %. No results found. No results for input(s): WBC, HGB, HCT, PLT in the last 72 hours. No results for input(s): NA, K, CL, GLUCOSE, BUN, CREATININE, CALCIUM in the last 72 hours.  Invalid input(s): CO CBG (last 3)   Recent Labs  09/24/16 1624 09/24/16 2048 09/25/16 0632  GLUCAP 177* 161* 94    Wt Readings from Last 3 Encounters:  09/25/16 79.1 kg (174 lb 6.1 oz)  09/14/16 79.5 kg (175 lb 3.2 oz)  08/14/16 90.7 kg (200 lb)    Physical Exam:  Constitutional: NAD. Vital signs reviewed. Well-developed.  HENT:  [place]. NT.  Eyes: EOMI. No discharge.  Neck: +Stoma healing. Cardiovascular: RRR. No JVD. Respiratory: CTA b/l. Unlabored. GI: BS+, NT/ND Musculoskeletal: He exhibits no edemaor tenderness. +LE edema. Neurological: He is alertand oriented.  Motor: 4+/5 throughout, except right shoulder limited but improving Skin: Skin is warmand dry. He is not diaphoretic. +Stoma. Psychiatric: pleasant. cooperative   Assessment/Plan: 1. Functional deficits secondary to debility and anoxic encephalopathy which require 3+ hours per day of interdisciplinary therapy in a comprehensive inpatient rehab setting. Physiatrist is providing close team supervision and 24 hour management of active medical problems listed below. Physiatrist and rehab team continue to assess barriers to discharge/monitor patient progress toward functional and medical goals.  Function:  Bathing Bathing position   Position: Shower  Bathing parts Body parts bathed by patient: Right arm, Left arm, Chest, Abdomen,  Front perineal area, Right upper leg, Left upper leg, Right lower leg, Left lower leg, Buttocks Body parts bathed by helper: Back  Bathing assist Assist Level: Touching or steadying assistance(Pt > 75%)      Upper Body Dressing/Undressing Upper body dressing   What is the patient wearing?: Pull over shirt/dress     Pull over shirt/dress - Perfomed by patient: Thread/unthread right sleeve, Thread/unthread left sleeve, Put head through opening, Pull shirt over trunk Pull over shirt/dress - Perfomed by helper: Put head through opening Button up shirt - Perfomed by patient: Thread/unthread right sleeve, Thread/unthread left sleeve Button up shirt - Perfomed by helper: Pull shirt around back, Button/unbutton shirt    Upper body assist Assist Level: Supervision or verbal cues      Lower Body Dressing/Undressing Lower body dressing   What is the patient wearing?: Underwear, Pants, Non-skid slipper socks Underwear - Performed by patient: Thread/unthread right underwear leg, Thread/unthread left underwear leg, Pull underwear up/down Underwear - Performed by helper: Pull underwear up/down, Thread/unthread left underwear leg Pants- Performed by patient: Thread/unthread right pants leg, Thread/unthread left pants leg, Pull pants up/down Pants- Performed by helper: Pull pants up/down   Non-skid slipper socks- Performed by helper: Don/doff right sock, Don/doff left sock     Shoes - Performed by patient: Don/doff right shoe, Don/doff left shoe            Lower body assist Assist for lower body dressing: Supervision or verbal cues      Toileting Toileting   Toileting steps completed by patient: Adjust clothing prior to toileting, Performs perineal hygiene, Adjust clothing after toileting Toileting steps  completed by helper: Adjust clothing prior to toileting Toileting Assistive Devices: Grab bar or rail  Toileting assist Assist level: Supervision or verbal cues   Transfers Chair/bed  transfer   Chair/bed transfer method: Ambulatory Chair/bed transfer assist level: Supervision or verbal cues Chair/bed transfer assistive device: Armrests, Walker Mechanical lift: Landscape architecttedy   Locomotion Ambulation Ambulation activity did not occur: Safety/medical concerns   Max distance: 10 ft Assist level: Supervision or verbal cues   Wheelchair   Type: Manual Max wheelchair distance: 100 ft (BLE) Assist Level: Supervision or verbal cues  Cognition Comprehension Comprehension assist level: Follows complex conversation/direction with extra time/assistive device  Expression Expression assist level: Expresses complex ideas: With extra time/assistive device  Social Interaction Social Interaction assist level: Interacts appropriately with others with medication or extra time (anti-anxiety, antidepressant).  Problem Solving Problem solving assist level: Solves basic problems with no assist  Memory Memory assist level: Recognizes or recalls 90% of the time/requires cueing < 10% of the time   Medical Problem List and Plan: 1. Functional, mobility and cognitive deficits secondary to debility/?anoxic encephalopathy with recent history of right rotator cuff repair -continue CIR 2. DVT Prophylaxis/Anticoagulation: Pharmaceutical: Lovenox 3. Pain Management: Added low dose Neurontin to help with neuropathy BUE.     used ibuprofen daily for myalgias at home.  4. Mood: Team to provide ego support. LCSW to follow for evaluation and support.  5. Neuropsych: This patient is not fully capable of making decisions on hisown behalf. 6. Skin/Wound Care: routine trach care. Continue airmatress overlay for pressure relief measures.  7. Fluids/Electrolytes/Nutrition: push po  D3 thin diet, will advance as tolerated  K+ 4.0 11/21, repleting   Labs ordered for tomorrow 8. 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 Anxiety:   -now on celexa  -xanax prn for breakthrough symptoms. 11. DM type 2:   Hgb A1C 6.9.  Lantus increased to 22U on 11/22  Will monitor BS ac/hs and use SSI as well.   Overall improving 11/27  Will cont to monitor 12. Leukocytosis  Likely related to steroids  WBCs 10.3 11/21  Labs ordered for tomorrow 13. LE edema  Echo reviewed, EF WNL  Lasix 20mg  started 11/26  LOS (Days) 10 A FACE TO FACE EVALUATION WAS PERFORMED  Ruthe Roemer Karis Jubanil Masiah Woody 09/25/2016 9:02 AM

## 2016-09-26 ENCOUNTER — Inpatient Hospital Stay (HOSPITAL_COMMUNITY): Payer: BLUE CROSS/BLUE SHIELD | Admitting: Physical Therapy

## 2016-09-26 ENCOUNTER — Inpatient Hospital Stay (HOSPITAL_COMMUNITY): Payer: BLUE CROSS/BLUE SHIELD | Admitting: Speech Pathology

## 2016-09-26 ENCOUNTER — Inpatient Hospital Stay (HOSPITAL_COMMUNITY): Payer: BLUE CROSS/BLUE SHIELD | Admitting: Occupational Therapy

## 2016-09-26 DIAGNOSIS — F330 Major depressive disorder, recurrent, mild: Principal | ICD-10-CM

## 2016-09-26 LAB — BASIC METABOLIC PANEL
ANION GAP: 10 (ref 5–15)
BUN: 6 mg/dL (ref 6–20)
CHLORIDE: 105 mmol/L (ref 101–111)
CO2: 27 mmol/L (ref 22–32)
CREATININE: 0.57 mg/dL — AB (ref 0.61–1.24)
Calcium: 8.7 mg/dL — ABNORMAL LOW (ref 8.9–10.3)
GFR calc non Af Amer: >60 mL/min (ref >60)
Glucose, Bld: 109 mg/dL — ABNORMAL HIGH (ref 65–99)
Potassium: 3.2 mmol/L — ABNORMAL LOW (ref 3.5–5.1)
SODIUM: 142 mmol/L (ref 135–145)

## 2016-09-26 LAB — CBC WITH DIFFERENTIAL/PLATELET
Basophils Absolute: 0 K/uL (ref 0.0–0.1)
Basophils Relative: 0 %
Eosinophils Absolute: 0.2 K/uL (ref 0.0–0.7)
Eosinophils Relative: 1 %
HCT: 39.5 % (ref 39.0–52.0)
Hemoglobin: 12.6 g/dL — ABNORMAL LOW (ref 13.0–17.0)
Lymphocytes Relative: 28 %
Lymphs Abs: 3.7 K/uL (ref 0.7–4.0)
MCH: 29.4 pg (ref 26.0–34.0)
MCHC: 31.9 g/dL (ref 30.0–36.0)
MCV: 92.3 fL (ref 78.0–100.0)
Monocytes Absolute: 0.9 K/uL (ref 0.1–1.0)
Monocytes Relative: 7 %
Neutro Abs: 8.3 K/uL — ABNORMAL HIGH (ref 1.7–7.7)
Neutrophils Relative %: 64 %
Platelets: 203 K/uL (ref 150–400)
RBC: 4.28 MIL/uL (ref 4.22–5.81)
RDW: 15.8 % — ABNORMAL HIGH (ref 11.5–15.5)
WBC: 13.2 K/uL — ABNORMAL HIGH (ref 4.0–10.5)

## 2016-09-26 LAB — GLUCOSE, CAPILLARY
GLUCOSE-CAPILLARY: 118 mg/dL — AB (ref 65–99)
GLUCOSE-CAPILLARY: 152 mg/dL — AB (ref 65–99)
GLUCOSE-CAPILLARY: 110 mg/dL — AB (ref 65–99)
Glucose-Capillary: 100 mg/dL — ABNORMAL HIGH (ref 65–99)

## 2016-09-26 MED ORDER — POTASSIUM CHLORIDE CRYS ER 20 MEQ PO TBCR
30.0000 meq | EXTENDED_RELEASE_TABLET | Freq: Two times a day (BID) | ORAL | Status: DC
  Administered 2016-09-26 – 2016-09-28 (×4): 30 meq via ORAL
  Filled 2016-09-26 (×4): qty 1

## 2016-09-26 NOTE — Progress Notes (Signed)
Occupational Therapy Session Note  Patient Details  Name: Bradley [name] MRN: [id number] Date of Birth: [birth year]  Today's Date: 09/26/2016 OT Individual Time: 0900-1000 OT Individual Time Calculation (min): 60 min     Short Term Goals: Week 1:  OT Short Term Goal 1 (Week 1): Complete toilet transfer with min assist OT Short Term Goal 2 (Week 1): Complete upper body bathing seated at EOB or sink, with mod assist OT Short Term Goal 3 (Week 1): Complete lower body bathing with mod assist using AE, prn OT Short Term Goal 4 (Week 1): Groom seated with mod assist OT Short Term Goal 5 (Week 1): Maintain supported standing balance for 2 minutes during assisted dressing  Skilled Therapeutic Interventions/Progress Updates:    1:1 OT session focused on one-handed activity modifications, sling-management, tub/shower transfers, LB strengthening,  And R shoulder PROM.  Demonstrated one-handed dressing technique with large pull-over shirt, pt demonstrated understanding and was able to don/doff shirt with only cues for technique. Pt brought to therapy apartment and Ot demonstrated safe tub/shower transfer using tub bench. Pt completed transfer with supervision. Pt then ambulated to therapy gym w/ cane, close supervision with intermittent CGA for later LOB. Pt completed 12 minutes on Nu Step with legs only with 3 minute intervals at level 4, 5, then 6. HR 97, SpO2- 96, BP 104/68. Pt returned to room in w/c for time management and OT assisted with PROM HEP to R shoulder in supine position. Pt left seated in wc at need of session with needs met and spouse present.   Therapy Documentation Precautions:  Precautions Precautions: Shoulder, Other (comment), Fall Type of Shoulder Precautions: Hx of R-RTC repair 08/14/16 Shoulder Interventions: Shoulder sling/immobilizer Precaution Comments: trach  Restrictions Weight Bearing Restrictions: No  Pain: Pain Assessment Pain Assessment: 0-10 Pain Score: 4  Pain  Type: Acute pain Pain Location: Leg Pain Orientation: Right;Left Pain Descriptors / Indicators: Sore;Aching Pain Intervention(s): repositioned ADL: ADL ADL Comments: See Functional Assessment Tool    See Function Navigator for Current Functional Status.   Therapy/Group: Individual Therapy  Valma Cava 09/26/2016, 3:52 PM

## 2016-09-26 NOTE — Progress Notes (Signed)
[place] PHYSICAL MEDICINE & REHABILITATION     PROGRESS NOTE    Subjective/Complaints: Pt sitting up in his chair this AM, eager to resume therapies.  He has questions about discharge.   ROS: Denies CP, SOB, N/V/D.  Objective: Vital Signs: Blood pressure 96/72, pulse 91, temperature 97.6 F (36.4 C), temperature source Oral, resp. rate 18, height 5\' 9"  (1.753 m), weight 80 kg (176 lb 5.9 oz), SpO2 97 %. No results found.  Recent Labs  09/26/16 0535  WBC 13.2*  HGB 12.6*  HCT 39.5  PLT 203    Recent Labs  09/26/16 0535  NA 142  K 3.2*  CL 105  GLUCOSE 109*  BUN 6  CREATININE 0.57*  CALCIUM 8.7*   CBG (last 3)   Recent Labs  09/25/16 1642 09/25/16 2103 09/26/16 0637  GLUCAP 173* 149* 118*    Wt Readings from Last 3 Encounters:  09/26/16 80 kg (176 lb 5.9 oz)  09/14/16 79.5 kg (175 lb 3.2 oz)  08/14/16 90.7 kg (200 lb)    Physical Exam:  Constitutional: NAD. Vital signs reviewed. Well-developed.  HENT:  [name]. NT.  Eyes: EOMI. No discharge.  Neck: +Stoma continues to heal. Cardiovascular: RRR. No JVD. Respiratory: CTA b/l. Unlabored. GI: BS+, NT/ND Musculoskeletal: He exhibits no edemaor tenderness. +LE edema, improving. Neurological: He is alertand oriented.  Motor: 4+/5 throughout, except right shoulder limited but improving Skin: Skin is warmand dry. He is not diaphoretic. +Stoma. Psychiatric: pleasant. cooperative   Assessment/Plan: 1. Functional deficits secondary to debility and anoxic encephalopathy which require 3+ hours per day of interdisciplinary therapy in a comprehensive inpatient rehab setting. Physiatrist is providing close team supervision and 24 hour management of active medical problems listed below. Physiatrist and rehab team continue to assess barriers to discharge/monitor patient progress toward functional and medical goals.  Function:  Bathing Bathing position   Position: Shower  Bathing parts Body parts bathed by  patient: Right arm, Left arm, Chest, Abdomen, Front perineal area, Buttocks, Right upper leg, Left upper leg, Right lower leg, Left lower leg Body parts bathed by helper: Back  Bathing assist Assist Level: Supervision or verbal cues      Upper Body Dressing/Undressing Upper body dressing   What is the patient wearing?: Pull over shirt/dress     Pull over shirt/dress - Perfomed by patient: Thread/unthread right sleeve, Thread/unthread left sleeve, Pull shirt over trunk Pull over shirt/dress - Perfomed by helper: Put head through opening Button up shirt - Perfomed by patient: Thread/unthread right sleeve, Thread/unthread left sleeve Button up shirt - Perfomed by helper: Pull shirt around back, Button/unbutton shirt    Upper body assist Assist Level: Touching or steadying assistance(Pt > 75%)      Lower Body Dressing/Undressing Lower body dressing   What is the patient wearing?: Underwear, Pants, Shoes Underwear - Performed by patient: Thread/unthread left underwear leg, Thread/unthread right underwear leg, Pull underwear up/down Underwear - Performed by helper: Pull underwear up/down, Thread/unthread left underwear leg Pants- Performed by patient: Thread/unthread right pants leg, Thread/unthread left pants leg, Pull pants up/down Pants- Performed by helper: Pull pants up/down   Non-skid slipper socks- Performed by helper: Don/doff right sock, Don/doff left sock     Shoes - Performed by patient: Don/doff right shoe, Don/doff left shoe            Lower body assist Assist for lower body dressing: Supervision or verbal cues      Toileting Toileting   Toileting steps completed by patient:  Adjust clothing prior to toileting, Performs perineal hygiene, Adjust clothing after toileting Toileting steps completed by helper: Adjust clothing prior to toileting Toileting Assistive Devices: Grab bar or rail  Toileting assist Assist level: Supervision or verbal cues   Transfers Chair/bed  transfer   Chair/bed transfer method: Ambulatory Chair/bed transfer assist level: Supervision or verbal cues Chair/bed transfer assistive device: Bedrails, Cane Mechanical lift: Landscape architecttedy   Locomotion Ambulation Ambulation activity did not occur: Safety/medical concerns   Max distance: 50 Assist level: Touching or steadying assistance (Pt > 75%)   Wheelchair   Type: Manual Max wheelchair distance: 100 ft (BLE) Assist Level: Supervision or verbal cues  Cognition Comprehension Comprehension assist level: Follows complex conversation/direction with no assist  Expression Expression assist level: Expresses complex ideas: With no assist  Social Interaction Social Interaction assist level: Interacts appropriately with others with medication or extra time (anti-anxiety, antidepressant).  Problem Solving Problem solving assist level: Solves complex 90% of the time/cues < 10% of the time  Memory Memory assist level: Recognizes or recalls 90% of the time/requires cueing < 10% of the time   Medical Problem List and Plan: 1. Functional, mobility and cognitive deficits secondary to debility/?anoxic encephalopathy with recent history of right rotator cuff repair -continue CIR 2. DVT Prophylaxis/Anticoagulation: Pharmaceutical: Lovenox 3. Pain Management: Added low dose Neurontin to help with neuropathy BUE.     used ibuprofen daily for myalgias at home.  4. Mood: Team to provide ego support. LCSW to follow for evaluation and support.  5. Neuropsych: This patient is not fully capable of making decisions on hisown behalf. 6. Skin/Wound Care: routine trach care. Continue airmatress overlay for pressure relief measures.  7. Fluids/Electrolytes/Nutrition: push po  D3 thin diet, will advance as tolerated  K+ 4.0 11/21, repleting increased on 11/28  Labs ordered for tomorrow 8. VDRF:   Self-decanulated 11/24, did not replace, no issues  Cont to monitor 9. COPD with IPF: On prednisone 20  mg daily 10. Anxiety:   -now on celexa  -xanax prn for breakthrough symptoms. 11. DM type 2:   Hgb A1C 6.9.  Lantus increased to 22U on 11/22  Will monitor BS ac/hs and use SSI as well.   Overall controlled 11/28  Will cont to monitor 12. Leukocytosis  Likely related to steroids  WBCs 13.2 11/28  Afebrile 13. LE edema  Echo reviewed, EF WNL  Lasix 20mg  started 11/26  LOS (Days) 11 A FACE TO FACE EVALUATION WAS PERFORMED  Bradley [name] 09/26/2016 8:56 AM

## 2016-09-26 NOTE — Progress Notes (Signed)
Physical Therapy Session Note  Patient Details  Name: Bradley [name] MRN: [id number] Date of Birth: 12-10-1960  Today's Date: 09/26/2016 PT Individual Time: [id number] and [id number] PT Individual Time Calculation (min): 60 min and 27 min  Short Term Goals: Week 2:  PT Short Term Goal 1 (Week 2): = LTGs due to anticipated LOS  Skilled Therapeutic Interventions/Progress Updates:    Treatment 1: Patient in wheelchair upon arrival with wife in room. Gait training using SPC to gym with close supervision. Continued discharge planning for DME needs with patient reporting that he prefers to DC home using RW for balance, confidence, and energy conservation (already owns one) but would like SPC to continue practicing and 16 x 16 manual wheelchair with basic cushion and HHPT f/u. Stair training up/down 4 + 8 (6") stairs using LUE ascending/descending with step-to pattern and supervision with 1 seated rest break. Patient requesting to toilet, ambulated back to [facility] using [facility] with supervision to perform toileting tasks with mod I. Patient requesting to use RW instead of SPC for remainder of session and ambulated back to gym using RW with supervision-mod I. Reviewed OTAGO HEP for BLE strengthening and falls prevention at countertop for BUE support using handout: standing alt knee flexion, alt hip flexion, 2 x 10 each side and heel raises x 20 and seated toe raises x 20, LAQ x 10 each side. Patient fatigued and propelled wheelchair back to room using BLE only for neuro re-ed. Patient left sitting in wheelchair with wife in room and needs in reach.   Treatment 2: Patient fatigued from AM therapies and reporting 3/10 pain "all over," premedicated. Patient elected to propel wheelchair to and from gym using BLE only for neuro re-ed. Reviewed remaining OTAGO exercises for heel raises 2 x 10 and squats x 10 at countertop. Patient reported "feeling something," seated BP 111/72 and standing 99/57, patient reported having  taken BP medication prior to therapy. Patient required rest breaks due to fatigue and lightheadedness. Patient left sitting in wheelchair with all needs in reach.   Therapy Documentation Precautions:  Precautions Precautions: Shoulder, Other (comment), Fall Type of Shoulder Precautions: Hx of R-RTC repair 08/14/16 Shoulder Interventions: Shoulder sling/immobilizer Precaution Comments: trach  Restrictions Weight Bearing Restrictions: No Pain: Pain Assessment Pain Assessment: No/denies pain Pain Score: 2  (pt states that he wants to premedicate for morning therapy) Faces Pain Scale: No hurt Pain Type: Acute pain Pain Location: Leg Pain Orientation: Right Pain Descriptors / Indicators: Sore Pain Intervention(s): Medication (See eMAR)   See Function Navigator for Current Functional Status.   Therapy/Group: Individual Therapy  Kerney ElbeVarner, Kennah Hehr A 09/26/2016, 11:35 AM

## 2016-09-26 NOTE — Progress Notes (Signed)
Speech Language Pathology Daily Session Note  Patient Details  Name: Bradley FraiseDavid M Ploch MRN: [id number] Date of Birth: [birth year]  Today's Date: 09/26/2016 SLP Individual Time: 1455-1535 SLP Individual Time Calculation (min): 40 min   Short Term Goals: Week 2: SLP Short Term Goal 1 (Week 2): Pt will utilize external memory aids to recall new, daily information with supervision verbal cues.  SLP Short Term Goal 2 (Week 2): Pt will complete semi-complex problem solving tasks with supervision verbal cues and 90% accuracy.  SLP Short Term Goal 3 (Week 2): Pt will verbalize safety precautions for valve use with supervision question cues.  SLP Short Term Goal 4 (Week 2): Pt will return demonstration of donning and doffing speaking valve with Mod I.  SLP Short Term Goal 5 (Week 2): Pt will consume trials of regular textures with mod I use of swallowing precautions over 2 consecutive sessions prior to advancement.    Skilled Therapeutic Interventions:  Pt was seen for skilled ST targeting cognitive goals.  SLP facilitated the session with a novel card game targeting use of memory compensatory strategies; specifically associations.  Pt generated word-picture associations following initial task instruction with mod I.  Pt was able to recall associations after varying lengths of delay for 100% accuracy with mod I.  Pt was also able to name and recall specific category members within task with mod I. SLP also re-administered money management subtest of ALFA to measure progress from initial attempt given pt's rapid improvement in cognition over the last 5-7 days.  Pt counted money, made change, and generated values of coins when named for 100% accuracy with intermittent supervision verbal cues needed to monitor and correct errors.  Pt was returned to room and left in wheelchair with call bell within reach.  Continue per current plan of care.    Function:  Eating Eating                  Cognition Comprehension Comprehension assist level: Follows complex conversation/direction with no assist  Expression   Expression assist level: Expresses complex ideas: With no assist  Social Interaction Social Interaction assist level: Interacts appropriately with others with medication or extra time (anti-anxiety, antidepressant).  Problem Solving Problem solving assist level: Solves complex 90% of the time/cues < 10% of the time  Memory Memory assist level: Recognizes or recalls 90% of the time/requires cueing < 10% of the time    Pain Pain Assessment Pain Assessment: No/denies pain   Therapy/Group: Individual Therapy  Obinna Ehresman, Melanee SpryNicole L 09/26/2016, 4:07 PM

## 2016-09-27 ENCOUNTER — Inpatient Hospital Stay (HOSPITAL_COMMUNITY): Payer: BLUE CROSS/BLUE SHIELD | Admitting: Physical Therapy

## 2016-09-27 ENCOUNTER — Inpatient Hospital Stay (HOSPITAL_COMMUNITY): Payer: BLUE CROSS/BLUE SHIELD | Admitting: Occupational Therapy

## 2016-09-27 ENCOUNTER — Inpatient Hospital Stay (HOSPITAL_COMMUNITY): Payer: BLUE CROSS/BLUE SHIELD | Admitting: Speech Pathology

## 2016-09-27 LAB — GLUCOSE, CAPILLARY
GLUCOSE-CAPILLARY: 105 mg/dL — AB (ref 65–99)
GLUCOSE-CAPILLARY: 135 mg/dL — AB (ref 65–99)
GLUCOSE-CAPILLARY: 231 mg/dL — AB (ref 65–99)
GLUCOSE-CAPILLARY: 189 mg/dL — AB (ref 65–99)

## 2016-09-27 MED ORDER — VORTIOXETINE HBR 20 MG PO TABS
20 mg | Freq: Every day | ORAL | 1 refills | Status: CP
Start: 2016-09-27 — End: 2017-01-01

## 2016-09-27 NOTE — Progress Notes (Signed)
Social Work Patient ID: Bradley [name], male   DOB: September 18, 1961, 55 y.o.   MRN: [id number]  Met with pt to discuss team conference he has met his goals of mod/i and his equipment is in his room. He feels ready to go home and is so glad he came here to rehab. He feels prepared to go home tomorrow.

## 2016-09-27 NOTE — Progress Notes (Signed)
Occupational Therapy Discharge Summary  Patient Details  Name: Bradley [name] MRN: [id number] Date of Birth: 08/16/1961  Today's Date: 09/27/2016 OT Individual Time: 1100-1155 OT Individual Time Calculation (min): 55 min   Patient has met 12 of 12 long term goals due to improved activity tolerance, improved balance, postural control, ability to compensate for deficits, improved attention, improved awareness and improved coordination.  Patient to discharge at overall Modified Independent level.  Patient's care partner is independent to provide the necessary physical assistance at discharge.    Reasons goals not met: n/a  Recommendation:  Patient will benefit from ongoing skilled OT services in home health setting to continue to advance functional skills in the area of BADL.  Equipment: wheelchair, cane  Reasons for discharge: treatment goals met and discharge from hospital  Patient/family agrees with progress made and goals achieved: Yes   Treatment Interventions Pt completed bathing/dressing with overall mod I. Pt ambulated to therapy gym with SPC and Mod I. OT fit pt for knee length TED hose, which pt demonstrated donning/doffing. OT reviewed ADL participation within home environment and energy conservation techniques. Pt left seated in wc at end of session with needs met and call bell within reach.   OT Discharge Precautions/Restrictions Precautions Precautions: Shoulder;Other (comment);Fall Type of Shoulder Precautions: Hx of R-RTC repair 08/14/16 Shoulder Interventions: Shoulder sling/immobilizer Restrictions Weight Bearing Restrictions: No Pain Pain Assessment Pain Assessment: No/denies pain ADL ADL Grooming: Modified independent Where Assessed-Grooming: Standing at sink, Sitting at sink Upper Body Bathing: Modified independent Where Assessed-Upper Body Bathing: Shower Lower Body Bathing: Modified independent Where Assessed-Lower Body Bathing: Shower Upper Body  Dressing: Modified independent (Device) Where Assessed-Upper Body Dressing: Wheelchair Lower Body Dressing: Modified independent Where Assessed-Lower Body Dressing: Wheelchair Toileting: Modified independent Where Assessed-Toileting: Glass blower/designer: Diplomatic Services operational officer Method: Human resources officer: Modified independent Clinical cytogeneticist Method: Optometrist: Other (comment), Transfer tub bench (cane) ADL Comments: Please see functional navigator Vision/Perception  Vision- History Baseline Vision/History: Wears glasses Wears Glasses: At all times Patient Visual Report: No change from baseline  Cognition Arousal/Alertness: Awake/alert Orientation Level: Oriented X4 Safety/Judgment: Appears intact Sensation Sensation Light Touch: Appears Intact Stereognosis: Appears Intact Hot/Cold: Appears Intact Proprioception: Appears Intact Coordination Gross Motor Movements are Fluid and Coordinated: No Fine Motor Movements are Fluid and Coordinated: Yes Coordination and Movement Description: limited right shoulder AROM d/t recent R-RTC repair, 08/14/16 Motor  Motor Motor: Within Functional Limits Balance Dynamic Standing Balance Dynamic Standing - Balance Support: During functional activity;Left upper extremity supported Dynamic Standing - Level of Assistance: 6: Modified independent (Device/Increase time) Extremity/Trunk Assessment RUE Assessment RUE Assessment: Exceptions to [facility] RUE AROM (degrees) RUE Overall AROM Comments: No resistive R UE exercises, elbow/wrist/hand AROM WFL RUE PROM (degrees) Right Shoulder Flexion: 100 Degrees LUE Assessment LUE Assessment: Within Functional Limits  See Function Navigator for Current Functional Status.  Daneen Schick Loella Hickle 09/27/2016, 12:17 PM

## 2016-09-27 NOTE — Progress Notes (Signed)
Physical Therapy Discharge Summary  Patient Details  Name: Bradley [name] MRN: [id number] Date of Birth: 23-May-1961  Today's Date: 09/27/2016 PT Individual Time: [phone] and [id number] PT Individual Time Calculation (min): 63 min and 25 min   Patient has met 8 of 8 long term goals due to improved activity tolerance, improved balance, improved postural control, increased strength, increased range of motion, decreased pain, ability to compensate for deficits and improved coordination.  Patient to discharge at an ambulatory level Modified Independent. Patient's care partner is independent to provide the necessary physical assistance at discharge.  Reasons goals not met: NA  Recommendation:  Patient will benefit from ongoing skilled PT services in home health setting to continue to advance safe functional mobility, address ongoing impairments in strength, balance, activity tolerance, post-op R shoulder rehab, and minimize fall risk.  Equipment: 16 x 16 manual wheelchair, SPC (patient owns RW)  Reasons for discharge: treatment goals met and discharge from hospital  Patient/family agrees with progress made and goals achieved: Yes  Skilled Therapeutic Intervention Treatment 1: Patient mod I overall in controlled and home environments up to 200 ft using [facility] with supervision for stairs, see function tab for further details. Performed toileting tasks with mod I. Reassessed Berg Balance Scale with score of 53/56 improved from 46/56 on 09/25/16. Patient requested that delivered wheelchair be lowered to allow for improved BLE propulsion and from seated position, he assisted therapist with using wrench and socket wrench to lower wheels and front casters using primarily LUE. Patient left in hallway with RN Pryor Montes while recently mopped floors dried prior to returning to room.   Treatment 2: Patient sitting in wheelchair, requiring cues to lock brakes before standing from wheelchair. Patient ambulated to  gym using [facility] with mod I with seated BP after ambulation 113/77. Performed NuStep using BLE only at level 5 x 3 min + level 3 x 8 min. Patient requested to be weighed, ambulated using SPC from gym > day room to obtain standing weight > patient's room with mod I and one seated rest break. Patient made mod I in room with no further questions/concerns regarding discharge home planned tomorrow.   PT Discharge Precautions/RestrictionsPrecautions Precautions: Shoulder;Other (comment);Fall Type of Shoulder Precautions: Hx of R-RTC repair 08/14/16 Shoulder Interventions: Shoulder sling/immobilizer Restrictions Weight Bearing Restrictions: No Pain Pain Assessment Pain Assessment: No/denies pain Pain Score: 2  Pain Type: Acute pain Pain Location: Leg Pain Orientation: Right;Left Pain Frequency: Constant Patients Stated Pain Goal: 2 Pain Intervention(s): Medication (See eMAR) Vision/Perception    No change from baseline Cognition Arousal/Alertness: Awake/alert Orientation Level: Oriented X4 Safety/Judgment: Appears intact Sensation Sensation Light Touch: Appears Intact Stereognosis: Appears Intact Hot/Cold: Appears Intact Proprioception: Appears Intact Coordination Gross Motor Movements are Fluid and Coordinated: No Fine Motor Movements are Fluid and Coordinated: Yes Coordination and Movement Description: limited right shoulder AROM d/t recent R-RTC repair, 08/14/16 Motor  Motor Motor: Within Functional Limits  Mobility Bed Mobility Bed Mobility: Rolling Right;Rolling Left;Sit to Supine;Supine to Sit Rolling Right: 6: Modified independent (Device/Increase time) Rolling Left: 6: Modified independent (Device/Increase time) Supine to Sit: 6: Modified independent (Device/Increase time) Sit to Supine: 6: Modified independent (Device/Increase time) Transfers Transfers: Yes Stand Pivot Transfers: 6: Modified independent (Device/Increase time) Locomotion  Ambulation Ambulation:  Yes Ambulation/Gait Assistance: 6: Modified independent (Device/Increase time) Ambulation Distance (Feet): 200 Feet Assistive device: Straight cane Gait Gait: Yes Gait Pattern: Impaired Gait Pattern: Lateral trunk lean to right;Lateral trunk lean to left;Step-through pattern Gait velocity: decreased Stairs / Additional Locomotion  Stairs: Yes Stairs Assistance: 5: Supervision Stair Management Technique: Two rails;Alternating pattern Number of Stairs: 12 Height of Stairs: 3 (8 3", 4 6") Ramp: 5: Supervision Wheelchair Mobility Wheelchair Mobility: No  Trunk/Postural Assessment  Cervical Assessment Cervical Assessment: Within Functional Limits Thoracic Assessment Thoracic Assessment: Within Functional Limits Lumbar Assessment Lumbar Assessment: Within Functional Limits Postural Control Postural Control: Within Functional Limits  Balance Balance Balance Assessed: Yes Standardized Balance Assessment Standardized Balance Assessment: Berg Balance Test Berg Balance Test Sit to Stand: Able to stand without using hands and stabilize independently Standing Unsupported: Able to stand safely 2 minutes Sitting with Back Unsupported but Feet Supported on Floor or Stool: Able to sit safely and securely 2 minutes Stand to Sit: Sits safely with minimal use of hands Transfers: Able to transfer safely, minor use of hands Standing Unsupported with Eyes Closed: Able to stand 10 seconds safely Standing Ubsupported with Feet Together: Able to place feet together independently and stand 1 minute safely From Standing, Reach Forward with Outstretched Arm: Can reach forward >12 cm safely (5") From Standing Position, Pick up Object from Floor: Able to pick up shoe safely and easily From Standing Position, Turn to Look Behind Over each Shoulder: Looks behind from both sides and weight shifts well Turn 360 Degrees: Able to turn 360 degrees safely in 4 seconds or less Standing Unsupported, Alternately  Place Feet on Step/Stool: Able to stand independently and safely and complete 8 steps in 20 seconds Standing Unsupported, One Foot in Front: Able to plae foot ahead of the other independently and hold 30 seconds Standing on One Leg: Able to lift leg independently and hold 5-10 seconds Total Score: 53 Dynamic Standing Balance Dynamic Standing - Balance Support: During functional activity;Left upper extremity supported Dynamic Standing - Level of Assistance: 6: Modified independent (Device/Increase time) Extremity Assessment  RUE Assessment RUE Assessment: Exceptions to WFL RUE AROM (degrees) RUE Overall AROM Comments: Limited due to premorbid condition, s/p R-RTC repair, 08/14/16 LUE Assessment LUE Assessment: Within Functional Limits RLE Assessment RLE Assessment: Within Functional Limits LLE Assessment LLE Assessment: Within Functional Limits   See Function Navigator for Current Functional Status.  Varner,  A 09/27/2016, 10:01 AM  

## 2016-09-27 NOTE — Plan of Care (Signed)
Problem: RH Other (Specify) Goal: RH LTG Other (Specify)1 Outcome: Not Applicable Date Met: 94/32/76 Pt decannulated

## 2016-09-27 NOTE — Patient Care Conference (Signed)
Inpatient RehabilitationTeam Conference and Plan of Care Update Date: 09/27/2016   Time: 2:15 PM    Patient Name: Bradley [name]      Medical Record Number: [id number]  Date of Birth: 09-04-1961 Sex: Male         Room/Bed: 4W17C/4W17C-01 Payor Info: Payor: [place] / Plan: [employer] OTHER / Product Type: *No Product type* /    Admitting Diagnosis: Debility withTrach  Admit Date/Time:  09/15/2016  4:20 PM Admission Comments: No comment available   Primary Diagnosis:  Debility Principal Problem: Debility  Patient Active Problem List   Diagnosis Date Noted  . Peripheral edema   . Anxiety state   . Dysphagia   . Hypokalemia   . Generalized anxiety disorder   . Leukocytosis   . Diabetes mellitus type 2 without retinopathy ([place])   . Debility 09/15/2016  . Anoxic encephalopathy ([place])   . Tracheostomy status ([place])   . ARDS (adult respiratory distress syndrome) ([place])   . Acute respiratory failure with hypoxia ([place]) 08/18/2016  . Community acquired pneumonia 08/18/2016  . COPD exacerbation ([place]) 08/18/2016  . Depression 08/18/2016  . Sepsis ([place]) 08/18/2016  . Complete tear of right rotator cuff 08/14/2016  . Right rotator cuff tear 08/14/2016  . Cigarette smoker 07/23/2015  . Obesity 07/23/2015  . Pulmonary fibrosis ([place]) 07/22/2015    Expected Discharge Date: Expected Discharge Date: 09/28/16  Team Members Present: Physician leading conference: Dr. Delice Lesch Social Worker Present: Ovidio Kin, LCSW Nurse Present: Dorien Chihuahua, RN PT Present: Carney Living, PT OT Present: Other (comment) Grayland Ormond Doe-OT) SLP Present: Weston Anna, SLP PPS Coordinator present : Daiva Nakayama, RN, CRRN     Current Status/Progress Goal Weekly Team Focus  Medical   Functional, mobility and cognitive deficits  secondary to debility/?anoxic encephalopathy with recent history of right rotator cuff repair  Safety, mobility, leukocytosis, hypokalemia, LE edema  See above    Bowel/Bladder   pt continent of bowel and bladder  Mod I  Mod I   Swallow/Nutrition/ Hydration   Regular, thin liquids   mod I   goals met, ready for discharge   ADL's   Mod I  Mod I  dynamic standing balance, activity tolernace, general sthrengthining, R UE PROM, family ed   Mobility   mod I  mod I   mobility training, standing balance, strengthening, activity tolerance, R shoulder rehab, DC planning, pt/fam education   Communication   decannulated         Safety/Cognition/ Behavioral Observations  mod I  mod I  goals met, pt is ready for discharge    Pain   pain to BLE and headaches. Oxy '5mg'$  PRN, robaxin '500mg'$  PRN, ultram '50mg'$  PRN  2 or less  assess pain and medicate as needed   Skin   trach stoma with guaze inplace CDI, pink blanchable sacrum  free of skin breakdown mod I  assess skin q shift      *See Care Plan and progress notes for long and short-term goals.  Barriers to Discharge: Safety, mobility, leukocytosis, hypokalemia, DM, LE edema    Possible Resolutions to Barriers:  Trach d/ced, follow labs, optimize DM meds, diuretics    Discharge Planning/Teaching Needs:  Home with wife who can provide supervision due to health issues. Pt has done well and feels ready to go home      Team Discussion:  Met goals of mod/i level and medically ready for discharge tomorrow.   Revisions to Treatment Plan:  DC  tomorrow   Continued Need for Acute Rehabilitation Level of Care: The patient requires daily medical management by a physician with specialized training in physical medicine and rehabilitation for the following conditions: Daily direction of a multidisciplinary physical rehabilitation program to ensure safe treatment while eliciting the highest outcome that is of practical value to the patient.: Yes Daily medical management of patient stability for increased activity during participation in an intensive rehabilitation regime.: Yes Daily analysis of laboratory values  and/or radiology reports with any subsequent need for medication adjustment of medical intervention for : Pulmonary problems;Other  Elease Hashimoto 09/27/2016, 3:37 PM

## 2016-09-27 NOTE — Progress Notes (Signed)
Speech Language Pathology Discharge Summary  Patient Details  Name: Bradley [name] MRN: [id number] Date of Birth: [birth year]  Today's Date: 09/27/2016 SLP Individual Time: 1300-1345 SLP Individual Time Calculation (min): 45 min    Skilled Therapeutic Interventions:  Pt was seen for skilled ST targeting cognitive goals.  SLP administered portions of the CLQT to measure progress from initial evaluation.  Pt scored above cut off score for all tasks assessed with the exception of the design genration subtest.  Pt reports he feels did as well on design generation subtest as he would have prior to admission.  Pt reports that he is back to baseline for cognition and has no concerns regarding going home.  All education is complete at this time.  Pt was left in wheelchair with call bell within reach.  Pt is ready for discharge tomorrow.       Patient has met 7 of 7 long term goals.  Patient to discharge at overall Modified Independent level.  Reasons goals not met:     Clinical Impression/Discharge Summary:   Pt has made functional gains and is discharging having met 7 out of 7 long term goals.  Pt is mod I for semi-complex cognitive tasks.  He endorses return to cognitive baseline and demonstrates appropriate safety awareness and judgment during functional tasks.  Pt is discharging home.  No family education needed given that pt is at baseline for cognition.  No further ST needs indicated at this time.    Care Partner:  Caregiver Able to Provide Assistance: Yes     Recommendation:  None      Equipment: none recommended by SLP    Reasons for discharge: Discharged from hospital   Patient/Family Agrees with Progress Made and Goals Achieved: Yes   Function:  Eating Eating                 Cognition Comprehension Comprehension assist level: Follows complex conversation/direction with no assist  Expression   Expression assist level: Expresses complex ideas: With no assist  Social  Interaction Social Interaction assist level: Interacts appropriately with others with medication or extra time (anti-anxiety, antidepressant).  Problem Solving Problem solving assist level: Solves complex problems: With extra time  Memory Memory assist level: Recognizes or recalls 90% of the time/requires cueing < 10% of the time   Emilio Math 09/27/2016, 2:51 PM

## 2016-09-27 NOTE — Progress Notes (Signed)
[facility]     PROGRESS NOTE    Subjective/Complaints: Pt sitting up in his chair this AM.  He is very positive, pleased with his progress, and looking forward to discharge tomorrow.    ROS: Denies CP, SOB, N/V/D.  Objective: Vital Signs: Blood pressure 118/75, pulse (!) 105, temperature 97.7 F (36.5 C), temperature source Oral, resp. rate 20, height 5\' 9"  (1.753 m), weight 81.2 kg (179 lb 0.2 oz), SpO2 95 %. No results found.  Recent Labs  09/26/16 0535  WBC 13.2*  HGB 12.6*  HCT 39.5  PLT 203    Recent Labs  09/26/16 0535  NA 142  K 3.2*  CL 105  GLUCOSE 109*  BUN 6  CREATININE 0.57*  CALCIUM 8.7*   CBG (last 3)   Recent Labs  09/26/16 1646 09/26/16 2036 09/27/16 0602  GLUCAP 100* 110* 105*    Wt Readings from Last 3 Encounters:  09/27/16 81.2 kg (179 lb 0.2 oz)  09/14/16 79.5 kg (175 lb 3.2 oz)  08/14/16 90.7 kg (200 lb)    Physical Exam:  Constitutional: NAD. Vital signs reviewed. Well-developed.  HENT:  [place]. NT.  Eyes: EOMI. No discharge.  Neck: +Stoma healing. Cardiovascular: RRR. No JVD. Respiratory: CTA b/l. Unlabored. GI: BS+, NT/ND Musculoskeletal: He exhibits no edemaor tenderness. +LE edema, improving. Neurological: He is alertand oriented.  Motor: 4+-5/5 throughout, except right shoulder limited but improving Skin: Skin is warmand dry. He is not diaphoretic. +Stoma. Psychiatric: pleasant. cooperative   Assessment/Plan: 1. Functional deficits secondary to debility and anoxic encephalopathy which require 3+ hours per day of interdisciplinary therapy in a comprehensive inpatient rehab setting. Physiatrist is providing close team supervision and 24 hour management of active medical problems listed below. Physiatrist and rehab team continue to assess barriers to discharge/monitor patient progress toward functional and medical goals.  Function:  Bathing Bathing position   Position: Shower   Bathing parts Body parts bathed by patient: Right arm, Left arm, Chest, Abdomen, Front perineal area, Buttocks, Right upper leg, Left upper leg, Right lower leg, Left lower leg Body parts bathed by helper: Back  Bathing assist Assist Level: Supervision or verbal cues      Upper Body Dressing/Undressing Upper body dressing   What is the patient wearing?: Pull over shirt/dress     Pull over shirt/dress - Perfomed by patient: Thread/unthread right sleeve, Thread/unthread left sleeve, Put head through opening, Pull shirt over trunk Pull over shirt/dress - Perfomed by helper: Put head through opening Button up shirt - Perfomed by patient: Thread/unthread right sleeve, Thread/unthread left sleeve Button up shirt - Perfomed by helper: Pull shirt around back, Button/unbutton shirt    Upper body assist Assist Level: Supervision or verbal cues      Lower Body Dressing/Undressing Lower body dressing   What is the patient wearing?: Underwear, Pants, Shoes Underwear - Performed by patient: Thread/unthread left underwear leg, Thread/unthread right underwear leg, Pull underwear up/down Underwear - Performed by helper: Pull underwear up/down, Thread/unthread left underwear leg Pants- Performed by patient: Thread/unthread right pants leg, Thread/unthread left pants leg, Pull pants up/down Pants- Performed by helper: Pull pants up/down   Non-skid slipper socks- Performed by helper: Don/doff right sock, Don/doff left sock     Shoes - Performed by patient: Don/doff right shoe, Don/doff left shoe            Lower body assist Assist for lower body dressing: Supervision or verbal cues      Toileting Toileting  Toileting steps completed by patient: Adjust clothing prior to toileting, Performs perineal hygiene, Adjust clothing after toileting Toileting steps completed by helper: Adjust clothing prior to toileting Toileting Assistive Devices: Grab bar or rail  Toileting assist Assist level: No  help/no cues   Transfers Chair/bed transfer   Chair/bed transfer method: Ambulatory Chair/bed transfer assist level: Supervision or verbal cues Chair/bed transfer assistive device: Armrests, Walker, Academic librarianCane Mechanical lift: Magazine features editortedy   Locomotion Ambulation Ambulation activity did not occur: Safety/medical concerns   Max distance: 200 ft Assist level: Supervision or verbal cues   Wheelchair   Type: Manual Max wheelchair distance: 200 ft (BLE) Assist Level: No help, No cues, assistive device, takes more than reasonable amount of time  Cognition Comprehension Comprehension assist level: Follows complex conversation/direction with no assist  Expression Expression assist level: Expresses complex ideas: With no assist  Social Interaction Social Interaction assist level: Interacts appropriately with others - No medications needed.  Problem Solving Problem solving assist level: Solves complex problems: Recognizes & self-corrects  Memory Memory assist level: Recognizes or recalls 90% of the time/requires cueing < 10% of the time   Medical Problem List and Plan: 1. Functional, mobility and cognitive deficits secondary to debility/?anoxic encephalopathy with recent history of right rotator cuff repair  continue CIR  Plan to see patient for transitional care management in 1-2 weeks post-discharge 2. DVT Prophylaxis/Anticoagulation: Pharmaceutical: Lovenox 3. Pain Management: Added low dose Neurontin to help with neuropathy BUE.     used ibuprofen daily for myalgias at home.  4. Mood: Team to provide ego support. LCSW to follow for evaluation and support.  5. Neuropsych: This patient is not fully capable of making decisions on hisown behalf. 6. Skin/Wound Care: routine trach care. Continue airmatress overlay for pressure relief measures.  7. Fluids/Electrolytes/Nutrition: push po  Regular diet now   K+ 3.2 11/28, repleting increased on 11/28  Labs ordered for tomorrow 8. VDRF:    Self-decanulated 11/24, did not replace, no issues  Cont to monitor 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 Anxiety:   -now on celexa  -xanax prn for breakthrough symptoms. 11. DM type 2:   Hgb A1C 6.9.  Lantus increased to 22U on 11/22  Will monitor BS ac/hs and use SSI as well.   Relatively stable 11/29  Will cont to monitor 12. Leukocytosis  Likely related to steroids  WBCs 13.2 11/28  Afebrile 13. LE edema  Echo reviewed, EF WNL  Lasix 20mg  started 11/26  LOS (Days) 12 A FACE TO FACE EVALUATION WAS PERFORMED  Ankit Karis Jubanil Patel 09/27/2016 8:46 AM

## 2016-09-28 LAB — BASIC METABOLIC PANEL
Anion gap: 11 (ref 5–15)
BUN: 5 mg/dL — ABNORMAL LOW (ref 6–20)
CO2: 26 mmol/L (ref 22–32)
CREATININE: 0.52 mg/dL — AB (ref 0.61–1.24)
Calcium: 8.5 mg/dL — ABNORMAL LOW (ref 8.9–10.3)
Chloride: 104 mmol/L (ref 101–111)
GFR calc Af Amer: >60 mL/min (ref >60)
GFR calc non Af Amer: >60 mL/min (ref >60)
GLUCOSE: 146 mg/dL — AB (ref 65–99)
Potassium: 3.5 mmol/L (ref 3.5–5.1)
Sodium: 141 mmol/L (ref 135–145)

## 2016-09-28 LAB — GLUCOSE, CAPILLARY: Glucose-Capillary: 104 mg/dL — ABNORMAL HIGH (ref 65–99)

## 2016-09-28 MED ORDER — BLOOD GLUCOSE MONITOR KIT: PACK | 0 refills | Status: DC

## 2016-09-28 MED ORDER — LABETALOL HCL 100 MG PO TABS: 100.0000 mg | ORAL_TABLET | Freq: Three times a day (TID) | ORAL | 0 refills | Status: DC

## 2016-09-28 MED ORDER — METFORMIN HCL 500 MG PO TABS: 500.0000 mg | ORAL_TABLET | Freq: Two times a day (BID) | ORAL | 1 refills | Status: DC

## 2016-09-28 MED ORDER — CITALOPRAM HYDROBROMIDE 20 MG PO TABS: 20.0000 mg | ORAL_TABLET | Freq: Every day | ORAL | 0 refills | Status: DC

## 2016-09-28 MED ORDER — PREDNISONE 20 MG PO TABS: 20.0000 mg | ORAL_TABLET | Freq: Every day | ORAL | 0 refills | Status: DC

## 2016-09-28 MED ORDER — POLYETHYLENE GLYCOL 3350 17 G PO PACK: 17.0000 g | PACK | Freq: Every day | ORAL | 0 refills | Status: AC

## 2016-09-28 MED ORDER — GABAPENTIN 100 MG PO CAPS: 100.0000 mg | ORAL_CAPSULE | Freq: Every day | ORAL | 0 refills | Status: DC

## 2016-09-28 MED ORDER — METFORMIN HCL 500 MG PO TABS: 500.0000 mg | ORAL_TABLET | Freq: Two times a day (BID) | ORAL | Status: DC

## 2016-09-28 MED ORDER — FUROSEMIDE 20 MG PO TABS: 20.0000 mg | ORAL_TABLET | Freq: Every day | ORAL | 0 refills | Status: DC

## 2016-09-28 MED ORDER — OXYCODONE HCL 5 MG PO TABS: 5.0000 mg | ORAL_TABLET | Freq: Three times a day (TID) | ORAL | 0 refills | Status: DC | PRN

## 2016-09-28 MED ORDER — FAMOTIDINE 10 MG PO TABS: 10.0000 mg | ORAL_TABLET | Freq: Two times a day (BID) | ORAL | 0 refills | Status: DC

## 2016-09-28 MED ORDER — METHOCARBAMOL 500 MG PO TABS: 500.0000 mg | ORAL_TABLET | Freq: Four times a day (QID) | ORAL | 0 refills | Status: DC | PRN

## 2016-09-28 MED ORDER — POTASSIUM CHLORIDE CRYS ER 15 MEQ PO TBCR: 30.0000 meq | EXTENDED_RELEASE_TABLET | Freq: Two times a day (BID) | ORAL | 0 refills | Status: DC

## 2016-09-28 NOTE — Progress Notes (Signed)
Pt. Got d/c papers,pt. Is ready to go home with family.

## 2016-09-28 NOTE — Discharge Instructions (Signed)
Inpatient Rehab Discharge Instructions  Bradley FraiseDavid M [name] Discharge date and time: 09/28/16    Activities/Precautions/ Functional Status: Activity: no lifting, driving, or strenuous exercise till cleared by MD Diet: diabetic diet Wound Care: keep wound clean and dry   Functional status:  ___ No restrictions     ___ Walk up steps independently ___ 24/7 supervision/assistance   ___ Walk up steps with assistance _X__ Intermittent supervision/assistance  ___ Bathe/dress independently ___ Walk with walker     _X__ Bathe/dress with supervision ___ Walk Independently    ___ Shower independently ___ Walk with assistance    ___ Shower with assistance _X__ No alcohol     ___ Return to work/school ________  Special Instructions: 1. You will need to check your blood sugar before meals and at bedtime as you are refusing to use insulin. Contact Dr. Eula ListenHussain if  your blood sugars are running over 200. I am starting you on low dose to avoid side effects. I anticipate that it is going to take a few days for metformin to start working and you will need the dose adjusted. 2.  Limit carbs to 4 servings with each meals. Can use sugar free high protein nutritional supplements 3.  Absolutely NO SMOKING.     COMMUNITY REFERRALS UPON DISCHARGE:    Home Health:   PT, OT, RN    Agency:[facility] Phone:[phone]   Date of last service:09/28/2016   Medical Equipment/Items Ordered:WHEELCHAIR & CANE  Agency/Supplier:[facility]   [phone]      Carbohydrate Counting for Diabetes Mellitus, Adult Carbohydrate counting is a method for keeping track of how many carbohydrates you eat. Eating carbohydrates naturally increases the amount of sugar (glucose) in the blood. Counting how many carbohydrates you eat helps keep your blood glucose within normal limits, which helps you manage your diabetes (diabetes mellitus). It is important to know how many carbohydrates you can safely have in each meal.  This is different for every person. A diet and nutrition specialist (registered dietitian) can help you make a meal plan and calculate how many carbohydrates you should have at each meal and snack. Carbohydrates are found in the following foods:  Grains, such as breads and cereals.  Dried beans and soy products.  Starchy vegetables, such as potatoes, peas, and corn.  Fruit and fruit juices.  Milk and yogurt.  Sweets and snack foods, such as cake, cookies, candy, chips, and soft drinks. How do I count carbohydrates? There are two ways to count carbohydrates in food. You can use either of the methods or a combination of both. Reading "Nutrition Facts" on packaged food  The "Nutrition Facts" list is included on the labels of almost all packaged foods and beverages in the [place] It includes:  The serving size.  Information about nutrients in each serving, including the grams (g) of carbohydrate per serving. To use the Nutrition Facts":  Decide how many servings you will have.  Multiply the number of servings by the number of carbohydrates per serving.  The resulting number is the total amount of carbohydrates that you will be having. Learning standard serving sizes of other foods  When you eat foods containing carbohydrates that are not packaged or do not include "Nutrition Facts" on the label, you need to measure the servings in order to count the amount of carbohydrates:  Measure the foods that you will eat with a food scale or measuring cup, if needed.  Decide how many standard-size servings you will eat.  Multiply  the number of servings by 15. Most carbohydrate-rich foods have about 15 g of carbohydrates per serving.  For example, if you eat 8 oz (170 g) of strawberries, you will have eaten 2 servings and 30 g of carbohydrates (2 servings x 15 g = 30 g).  For foods that have more than one food mixed, such as soups and casseroles, you must count the carbohydrates in each food  that is included. The following list contains standard serving sizes of common carbohydrate-rich foods. Each of these servings has about 15 g of carbohydrates:   hamburger bun or  English muffin.   oz (15 mL) syrup.   oz (14 g) jelly.  1 slice of bread.  1 six-inch tortilla.  3 oz (85 g) cooked rice or pasta.  4 oz (113 g) cooked dried beans.  4 oz (113 g) starchy vegetable, such as peas, corn, or potatoes.  4 oz (113 g) hot cereal.  4 oz (113 g) mashed potatoes or  of a large baked potato.  4 oz (113 g) canned or frozen fruit.  4 oz (120 mL) fruit juice.  4-6 crackers.  6 chicken nuggets.  6 oz (170 g) unsweetened dry cereal.  6 oz (170 g) plain fat-free yogurt or yogurt sweetened with artificial sweeteners.  8 oz (240 mL) milk.  8 oz (170 g) fresh fruit or one small piece of fruit.  24 oz (680 g) popped popcorn. Example of carbohydrate counting Sample meal  3 oz (85 g) chicken breast.  6 oz (170 g) brown rice.  4 oz (113 g) corn.  8 oz (240 mL) milk.  8 oz (170 g) strawberries with sugar-free whipped topping. Carbohydrate calculation 1. Identify the foods that contain carbohydrates:  Rice.  Corn.  Milk.  Strawberries. 2. Calculate how many servings you have of each food:  2 servings rice.  1 serving corn.  1 serving milk.  1 serving strawberries. 3. Multiply each number of servings by 15 g:  2 servings rice x 15 g = 30 g.  1 serving corn x 15 g = 15 g.  1 serving milk x 15 g = 15 g.  1 serving strawberries x 15 g = 15 g. 4. Add together all of the amounts to find the total grams of carbohydrates eaten:  30 g + 15 g + 15 g + 15 g = 75 g of carbohydrates total. This information is not intended to replace advice given to you by your health care provider. Make sure you discuss any questions you have with your health care provider. Document Released: 10/16/2005 Document Revised: 05/05/2016 Document Reviewed: 03/29/2016 Elsevier  Interactive Patient Education  2017 [employer]   My questions have been answered and I understand these instructions. I will adhere to these goals and the provided educational materials after my discharge from the hospital.  Patient/Caregiver Signature _______________________________ Date __________  Clinician Signature _______________________________________ Date __________  Please bring this form and your medication list with you to all your follow-up doctor's appointments.

## 2016-09-28 NOTE — Discharge Summary (Signed)
Physician Discharge Summary  Patient ID: Bradley [name] MRN: [id number] DOB/AGE: [birth year] 55 y.o.  Admit date: 09/15/2016 Discharge date: 09/28/2016  Discharge Diagnoses:  Principal Problem:   Debility Active Problems:   Pulmonary fibrosis (HCC)   Complete tear of right rotator cuff   Tracheostomy status (HCC)   Anoxic encephalopathy (HCC)   Hypokalemia   Generalized anxiety disorder   Leukocytosis   Diabetes mellitus type 2 without retinopathy (HCC)   Anxiety state   Dysphagia   Peripheral edema   Discharged Condition: stable  Significant Diagnostic Studies: N/A   Labs:  Basic Metabolic Panel:  Recent Labs Lab 09/26/16 0535 09/28/16 0525  NA 142 141  K 3.2* 3.5  CL 105 104  CO2 27 26  GLUCOSE 109* 146*  BUN 6 5*  CREATININE 0.57* 0.52*  CALCIUM 8.7* 8.5*    CBC: CBC Latest Ref Rng & Units 09/26/2016 09/19/2016 09/16/2016  WBC 4.0 - 10.5 K/uL 13.2(H) 10.3 11.4(H)  Hemoglobin 13.0 - 17.0 g/dL 12.6(L) 13.2 13.2  Hematocrit 39.0 - 52.0 % 39.5 40.3 39.3  Platelets 150 - 400 K/uL 203 257 160    CBG:  Recent Labs Lab 09/27/16 0602 09/27/16 1205 09/27/16 1633 09/27/16 2150 09/28/16 0629  GLUCAP 105* 135* 231* 189* 104*    Brief HPI:   Bradley [name] is a 55 year old male with h/o of COPD--ongoing tobacco use, interstitial pulmonary fibrosis, DOE, anxiety disorder, right shoulder arthroscopy 08/14/16 by Dr. Lorin Mercy and was admitted on 08/16/16 with acute hypoxic/hypercarbic respiratory failure due to IPF v/sp post ALI v/s HCAP. He was treated with broad spectrum antibiotics and steroids but progressed to VDRF requiring tracheostomy 11/6. He continued to require vent support for rest. 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 He was transferred to [facility] on 09/08/16 and was weaned off the vent and tolerating PMSV during the day. He was started on diet and was tolerating dysphagia II, thin liquids.  Mentation was improving but he continued to have high levels of anxiety which was worse at nights. He is tolerating increase in activity and CIR was recommended for follow up therapy.     Hospital Course: Bradley [name] was admitted to rehab 09/15/2016 for inpatient therapies to consist of PT, ST and OT at least three hours five days a week. Past admission physiatrist, therapy team and rehab RN have worked together to provide customized collaborative inpatient rehab. Blood pressures have been better controlled. BLE edema has improved with addition of lasix and hypokalemia has resolved with addition of K dur for supplementation.  His anxiety levels have improved and celexa was increased to home dose of 30 mg daily per patient request.  Respiratory status has been stable and he was weaned off oxygen and able to tolerate button plugging. He was decannulated without difficulty 11/24 and tracheostomy stoma has almost closed in. CBC shows leucocytosis likely related to steroids. He has been afebrile and no signs of infection noted   Right shoulder incision is healing well without signs or symptoms of infection. He continues to limit RUE to PROM and is to follow up with ortho for input increasing activity. Swallow function has improved and his diet was advanced to regular textures. His po intake has been good and he is continent of bowel and bladder.  Diabetes has been monitored on ac/hs basis and lantus was titrated upwards for tighter control.  At discharge, patient eported that he would not use insulin despite education regarding  need for BS control. He was started on metformin 500 mg bid and instructed on importance of carb modified diet after discharge. He was instructed on checking BS ac/hs and following up with PMD for titration of medication after discharge.  He has made steady progress during his rehab stay and was independent at discharge. He will continue to receive follow up HHPT,HHOT and [place] [facility] after discharge.    Rehab course: During patient's stay in rehab weekly team conferences were held to monitor patient's progress, set goals and discuss barriers to discharge. At admission, patient required max assist with basic self care tasks and mobility. He was tolerating dysphagia II diet and showed deficits in problem solving, memory and attention with MoCA 15/30. He has had improvement in activity tolerance, balance, postural control, as well as ability to compensate for deficits. He is able to complete ADL tasks at modified independent level. He is modified independent for transfers and to ambulate 200' at modified independent level with straight cane. He is able to complete semi-complex tasks at modified independent level. He felt that he was back to baseline cognitively and was demonstrating appropriate safety awareness and judgement. Family education was completed regarding all aspects of care and mobility.     Disposition: Home  Diet: diabetic diet.  Special Instructions: 1. Wear sling RUE and limit to PROM. 2. Check blood sugars ac/hs and call Dr. Deforest Hoyles if BS are running over 200. 3. No Smoking, Driving or strenuous activity.    Discharge Instructions    Ambulatory referral to Physical Medicine Rehab    Complete by:  As directed    Transitional care 1-2 week F/U       Medication List    STOP taking these medications   enoxaparin 40 MG/0.4ML injection Commonly known as:  LOVENOX   famotidine 20-0.9 MG/50ML-% Commonly known as:  PEPCID   feeding supplement (VITAL AF 1.2 CAL) Liqd   ibuprofen 200 MG tablet Commonly known as:  ADVIL,MOTRIN   insulin aspart 100 UNIT/ML injection Commonly known as:  novoLOG   insulin glargine 100 UNIT/ML injection Commonly known as:  LANTUS   mouth rinse Liqd solution     TAKE these medications   albuterol 108 (90 Base) MCG/ACT inhaler Commonly known as:  PROVENTIL HFA;VENTOLIN HFA Inhale 1 puff into the lungs every  6 (six) hours as needed for wheezing or shortness of breath.   blood glucose meter kit and supplies Kit Dispense based on patient and insurance preference. Use up to four times daily as directed. (FOR ICD-9 250.00, 250.01).   citalopram 30 MG tablet Commonly known as:  CELEXA Take 1.5  tablet (30 mg total) by mouth daily. What changed:  how much to take  when to take this   famotidine 10 MG tablet Commonly known as:  PEPCID Take 1 tablet (10 mg total) by mouth 2 (two) times daily.   fexofenadine-pseudoephedrine 60-120 MG 12 hr tablet Commonly known as:  ALLEGRA-D Take 1 tablet by mouth every morning.   Fluticasone-Salmeterol 250-50 MCG/DOSE Aepb Commonly known as:  ADVAIR Inhale 1 puff into the lungs 2 (two) times daily.   furosemide 20 MG tablet Commonly known as:  LASIX Take 1 tablet (20 mg total) by mouth daily. Start taking on:  09/29/2016   gabapentin 100 MG capsule Commonly known as:  NEURONTIN Take 1 capsule (100 mg total) by mouth at bedtime.   ipratropium 0.02 % nebulizer solution Commonly known as:  ATROVENT Take 2.5 mLs (  0.5 mg total) by nebulization 3 (three) times daily.   labetalol 100 MG tablet Commonly known as:  NORMODYNE Take 1 tablet (100 mg total) by mouth 3 (three) times daily.   levalbuterol 1.25 MG/0.5ML nebulizer solution Commonly known as:  XOPENEX Take 1.25 mg by nebulization 3 (three) times daily.   metFORMIN 500 MG tablet Commonly known as:  GLUCOPHAGE Take 1 tablet (500 mg total) by mouth 2 (two) times daily with a meal.   methocarbamol 500 MG tablet Commonly known as:  ROBAXIN Take 1 tablet (500 mg total) by mouth every 6 (six) hours as needed for muscle spasms.   oxyCODONE 5 MG immediate release table--Rx # 28 pills  Commonly known as:  Oxy IR/ROXICODONE Take 1 tablet (5 mg total) by mouth every 8 (eight) hours as needed for severe pain. Notes to patient:  In 4-5 days decrease to one pill every 12 hours (or twice a day) as needed  for severe pain. And further decrease to one pill daily as needed after a week to 10 days.   This will help you wean off and avoid issues with addiction.    polyethylene glycol packet Commonly known as:  MIRALAX / GLYCOLAX Take 17 g by mouth daily. What changed:  when to take this  reasons to take this   potassium chloride SA 15 MEQ tablet Commonly known as:  KLOR-CON M15 Take 2 tablets (30 mEq total) by mouth 2 (two) times daily.   predniSONE 20 MG tablet Commonly known as:  DELTASONE Take 1 tablet (20 mg total) by mouth daily with breakfast. Start taking on:  09/29/2016 What changed:  medication strength  how much to take  how to take this      Follow-up Information    PATEL, AARAT, MD Follow up.   Specialty:  Pediatric Rheumatology Why:  office will call you with follow up appointment Contact information: [address] [place] [address] [phone]        Marybelle Killings, MD Follow up.   Specialty:  Orthopedic Surgery Contact information: [place] [address] [phone]        Wenda Low, MD Follow up on 10/12/2016.   Specialty:  Internal Medicine Why:  Appointment @ 2:30 PM Contact information: [address] [place] [address] [phone]           Signed: Bary Leriche 09/28/2016, 10:31 AM

## 2016-09-28 NOTE — Progress Notes (Signed)
Social Work  Discharge Note  The overall goal for the admission was met for:   Discharge location: Yes-HOME WITH WIFE WHO CAN PROVIDE SUPERVISION LEVEL  Length of Stay: Yes-13 DAYS  Discharge activity level: Yes-MOD/I LEVEL  Home/community participation: Yes  Services provided included: MD, RD, PT, OT, SLP, RN, CM, TR, Pharmacy and SW  Financial Services: Private Insurance: [place]  Follow-up services arranged: Home Health: [facility]-PT,OT,RN, DME: [facility] and Patient/Family has no preference for HH/DME agencies  Comments (or additional information):PT DID VERY WELL AND REACHED HIS GOALS QUICKLY-DOES NOT [place] FEELS CAN QUIT ON HIS OWN AND HAS THE RESOURCES IF NEEDED. WIFE CAN PROVIDE SUPERVISION LEVEL DUE TO OWN HEALTH ISSUES.  Patient/Family verbalized understanding of follow-up arrangements: Yes  Individual responsible for coordination of the follow-up plan: SELF & WIFE  Confirmed correct DME delivered: Elease Hashimoto 09/28/2016    Elease Hashimoto

## 2016-10-02 ENCOUNTER — Telehealth: Payer: Self-pay

## 2016-10-02 NOTE — Telephone Encounter (Signed)
Mr Pricilla Holmtucker called today requesting a prescription of xanax for severe depression/anxiety. Please advise

## 2016-10-03 ENCOUNTER — Telehealth: Payer: Self-pay | Admitting: Physical Medicine & Rehabilitation

## 2016-10-03 MED ORDER — ALPRAZOLAM 1 MG PO TABS: 1.0000 mg | ORAL_TABLET | Freq: Three times a day (TID) | ORAL | 0 refills | Status: DC | PRN

## 2016-10-03 NOTE — Telephone Encounter (Signed)
He may have 1mg  TID PRN until his follow up office visit with me.  Thanks

## 2016-10-03 NOTE — Telephone Encounter (Signed)
Called #[phone] Alprazolam to [facility] and mr Pricilla Holmucker notified. (bridge to 10/19/16 appt)

## 2016-10-03 NOTE — Telephone Encounter (Signed)
Morrie Sheldonshley needs a quantity for a refill that Sybil called in for patient.  Please call her at [phone].

## 2016-10-03 NOTE — Telephone Encounter (Signed)
done

## 2016-10-03 NOTE — Addendum Note (Signed)
Addended by: Doreene ElandSHUMAKER, Fredis Malkiewicz W on: 10/03/2016 02:55 PM   Modules accepted: Orders

## 2016-10-09 ENCOUNTER — Encounter (HOSPITAL_COMMUNITY): Payer: Self-pay | Admitting: Emergency Medicine

## 2016-10-09 ENCOUNTER — Emergency Department (HOSPITAL_COMMUNITY)
Admission: EM | Admit: 2016-10-09 | Discharge: 2016-10-09 | Disposition: A | Discharge status: Home / Self Care | Payer: BLUE CROSS/BLUE SHIELD | Attending: Emergency Medicine | Admitting: Emergency Medicine

## 2016-10-09 DIAGNOSIS — F17210 Nicotine dependence, cigarettes, uncomplicated: Secondary | ICD-10-CM | POA: Insufficient documentation

## 2016-10-09 DIAGNOSIS — E119 Type 2 diabetes mellitus without complications: Secondary | ICD-10-CM | POA: Insufficient documentation

## 2016-10-09 DIAGNOSIS — K5641 Fecal impaction: Secondary | ICD-10-CM | POA: Diagnosis not present

## 2016-10-09 DIAGNOSIS — J449 Chronic obstructive pulmonary disease, unspecified: Secondary | ICD-10-CM | POA: Diagnosis not present

## 2016-10-09 DIAGNOSIS — Z79899 Other long term (current) drug therapy: Secondary | ICD-10-CM | POA: Insufficient documentation

## 2016-10-09 DIAGNOSIS — Z7984 Long term (current) use of oral hypoglycemic drugs: Secondary | ICD-10-CM | POA: Insufficient documentation

## 2016-10-09 DIAGNOSIS — K5900 Constipation, unspecified: Secondary | ICD-10-CM | POA: Diagnosis present

## 2016-10-09 LAB — COMPREHENSIVE METABOLIC PANEL
ALBUMIN: 4.1 g/dL (ref 3.5–5.0)
ALK PHOS: 52 U/L (ref 38–126)
ALT: 28 U/L (ref 17–63)
AST: 24 U/L (ref 15–41)
Anion gap: 12 (ref 5–15)
BILIRUBIN TOTAL: 1.1 mg/dL (ref 0.3–1.2)
BUN: 9 mg/dL (ref 6–20)
CALCIUM: 9.8 mg/dL (ref 8.9–10.3)
CO2: 24 mmol/L (ref 22–32)
Chloride: 103 mmol/L (ref 101–111)
Creatinine, Ser: 0.73 mg/dL (ref 0.61–1.24)
GFR calc Af Amer: >60 mL/min (ref >60)
GFR calc non Af Amer: >60 mL/min (ref >60)
GLUCOSE: 132 mg/dL — AB (ref 65–99)
Potassium: 3.7 mmol/L (ref 3.5–5.1)
SODIUM: 139 mmol/L (ref 135–145)
TOTAL PROTEIN: 7 g/dL (ref 6.5–8.1)

## 2016-10-09 LAB — CBC WITH DIFFERENTIAL/PLATELET
BASOS ABS: 0 10*3/uL (ref 0.0–0.1)
BASOS PCT: 0 %
EOS ABS: 0.1 10*3/uL (ref 0.0–0.7)
Eosinophils Relative: 1 %
HEMATOCRIT: 41.6 % (ref 39.0–52.0)
HEMOGLOBIN: 14.5 g/dL (ref 13.0–17.0)
Lymphocytes Relative: 15 %
Lymphs Abs: 2.5 10*3/uL (ref 0.7–4.0)
MCH: 30.7 pg (ref 26.0–34.0)
MCHC: 34.9 g/dL (ref 30.0–36.0)
MCV: 87.9 fL (ref 78.0–100.0)
Monocytes Absolute: 1.1 10*3/uL — ABNORMAL HIGH (ref 0.1–1.0)
Monocytes Relative: 7 %
NEUTROS ABS: 12.6 10*3/uL — AB (ref 1.7–7.7)
NEUTROS PCT: 77 %
Platelets: 239 10*3/uL (ref 150–400)
RBC: 4.73 MIL/uL (ref 4.22–5.81)
RDW: 16 % — ABNORMAL HIGH (ref 11.5–15.5)
WBC: 16.2 10*3/uL — AB (ref 4.0–10.5)

## 2016-10-09 MED ORDER — MINERAL OIL RE ENEM
1.0000 | ENEMA | Freq: Once | RECTAL | Status: DC
  Filled 2016-10-09: qty 1

## 2016-10-09 MED ORDER — DOCUSATE SODIUM 100 MG PO CAPS: 100.0000 mg | ORAL_CAPSULE | Freq: Two times a day (BID) | ORAL | 0 refills | Status: DC

## 2016-10-09 NOTE — Discharge Instructions (Signed)
Take your Miralax daily. Follow-up with your primary care physician for medication adjustment.

## 2016-10-09 NOTE — ED Provider Notes (Signed)
[facility] DEPT Provider Note   CSN: [id number] Arrival date & time: 10/09/16  1901     History   Chief Complaint Chief Complaint  Patient presents with  . Constipation    HPI Bradley [name] is a 55 y.o. male.  HPI  55 year old male presents with constipation. He states she's been constipated for the last 3 days. He had a normal bowel movement the day before that. Before that he's been having normal bowel movements. He is chronically on oxycodone for a recent shoulder surgery. He's been having some abdominal discomfort and rectal discomfort. Today this afternoon it all of a sudden became worse and it feels like he has a lot of pressure in his rectum. There is no nausea or vomiting. Tried 3 Dulcolax and a miralax with no relief. He is not typically on miralax. Wife was going to try an enema but his pain was so severe that they brought him here. Recently had an extended hospital stay for respiratory failure  Past Medical History:  Diagnosis Date  . Anxiety    Citalopram controls "panic attacks"  . Asthma   . COPD (chronic obstructive pulmonary disease) ([place])   . Diabetes mellitus type 2 without retinopathy ([place])   . Dyspnea    with exertion  . History of kidney stones    x3-4 - lithotripsy  . Pneumonia    08/11/16- 3 years ago  . Pulmonary fibrosis [facility])     Patient Active Problem List   Diagnosis Date Noted  . Peripheral edema   . Anxiety state   . Dysphagia   . Hypokalemia   . Generalized anxiety disorder   . Leukocytosis   . Diabetes mellitus type 2 without retinopathy ([place])   . Debility 09/15/2016  . Anoxic encephalopathy ([place])   . Tracheostomy status ([place])   . ARDS (adult respiratory distress syndrome) ([place])   . Acute respiratory failure with hypoxia ([place]) 08/18/2016  . Community acquired pneumonia 08/18/2016  . COPD exacerbation ([place]) 08/18/2016  . Depression 08/18/2016  . Sepsis ([place]) 08/18/2016  . Complete tear of right rotator cuff 08/14/2016  .  Right rotator cuff tear 08/14/2016  . Cigarette smoker 07/23/2015  . Obesity 07/23/2015  . Pulmonary fibrosis ([place]) 07/22/2015    Past Surgical History:  Procedure Laterality Date  . COLONOSCOPY W/ POLYPECTOMY    . LITHOTRIPSY    . MULTIPLE TOOTH EXTRACTIONS     19 teeth extracted at once  . SHOULDER ARTHROSCOPY WITH ROTATOR CUFF REPAIR Right 08/14/2016   Procedure: RIGHT SHOULDER ARTHROSCOPY,DEBRIDEMENT, OPEN ROTATOR CUFF REPAIR;  Surgeon: Marybelle Killings, MD;  Location: [facility];  Service: Orthopedics;  Laterality: Right;  . TONSILLECTOMY         Home Medications    Prior to Admission medications   Medication Sig Start Date End Date Taking? Authorizing Provider  albuterol (PROVENTIL HFA;VENTOLIN HFA) 108 (90 BASE) MCG/ACT inhaler Inhale 1 puff into the lungs every 6 (six) hours as needed for wheezing or shortness of breath.    Historical Provider, MD  ALPRAZolam Duanne Moron) 1 MG tablet Take 1 tablet (1 mg total) by mouth 3 (three) times daily as needed for anxiety. 10/03/16   Ankit Lorie Phenix, MD  blood glucose meter kit and supplies KIT Dispense based on patient and insurance preference. Use up to four times daily as directed. (FOR ICD-9 250.00, 250.01). 09/28/16   Ivan Anchors Love, PA-C  citalopram (CELEXA) 20 MG tablet Take 1 tablet (20 mg total) by mouth daily. 09/28/16  Ivan Anchors Love, PA-C  docusate sodium (COLACE) 100 MG capsule Take 1 capsule (100 mg total) by mouth every 12 (twelve) hours. 10/09/16   Sherwood Gambler, MD  famotidine (PEPCID) 10 MG tablet Take 1 tablet (10 mg total) by mouth 2 (two) times daily. 09/28/16   Ivan Anchors Love, PA-C  fexofenadine-pseudoephedrine (ALLEGRA-D) 60-120 MG 12 hr tablet Take 1 tablet by mouth every morning.     Historical Provider, MD  Fluticasone-Salmeterol (ADVAIR) 250-50 MCG/DOSE AEPB Inhale 1 puff into the lungs 2 (two) times daily.    Historical Provider, MD  furosemide (LASIX) 20 MG tablet Take 1 tablet (20 mg total) by mouth daily. 09/29/16    Bary Leriche, PA-C  gabapentin (NEURONTIN) 100 MG capsule Take 1 capsule (100 mg total) by mouth at bedtime. 09/28/16   Ivan Anchors Love, PA-C  ipratropium (ATROVENT) 0.02 % nebulizer solution Take 2.5 mLs (0.5 mg total) by nebulization 3 (three) times daily. 09/08/16   Nicolette Bang, DO  labetalol (NORMODYNE) 100 MG tablet Take 1 tablet (100 mg total) by mouth 3 (three) times daily. 09/28/16   Bary Leriche, PA-C  levalbuterol (XOPENEX) 1.25 MG/0.5ML nebulizer solution Take 1.25 mg by nebulization 3 (three) times daily. 09/08/16   Nicolette Bang, DO  metFORMIN (GLUCOPHAGE) 500 MG tablet Take 1 tablet (500 mg total) by mouth 2 (two) times daily with a meal. 09/28/16   Ivan Anchors Love, PA-C  methocarbamol (ROBAXIN) 500 MG tablet Take 1 tablet (500 mg total) by mouth every 6 (six) hours as needed for muscle spasms. 09/28/16   Bary Leriche, PA-C  oxyCODONE (OXY IR/ROXICODONE) 5 MG immediate release tablet Take 1 tablet (5 mg total) by mouth every 8 (eight) hours as needed for severe pain. 09/28/16   Ivan Anchors Love, PA-C  polyethylene glycol (MIRALAX / GLYCOLAX) packet Take 17 g by mouth daily. 09/28/16   Bary Leriche, PA-C  potassium chloride (KLOR-CON M15) 15 MEQ tablet Take 2 tablets (30 mEq total) by mouth 2 (two) times daily. 09/28/16   Bary Leriche, PA-C  predniSONE (DELTASONE) 20 MG tablet Take 1 tablet (20 mg total) by mouth daily with breakfast. 09/29/16   Bary Leriche, PA-C    Family History Family History  Problem Relation Age of Onset  . Heart disease Mother   . Colon cancer Mother     Social History Social History  Substance Use Topics  . Smoking status: Current Every Day Smoker    Packs/day: 1.00    Years: 30.00    Types: Cigarettes  . Smokeless tobacco: Never Used  . Alcohol use No     Allergies   Wellbutrin [bupropion]   Review of Systems Review of Systems  Gastrointestinal: Positive for constipation. Negative for abdominal pain, nausea and  vomiting.     Physical Exam Updated Vital Signs BP 131/96 (BP Location: Left Arm)   Pulse 111   Temp 98.4 F (36.9 C) (Oral)   Resp 20   Ht '5\' 9"'$  (1.753 m)   Wt 178 lb (80.7 kg)   SpO2 95%   BMI 26.29 kg/m   Physical Exam  Constitutional: He is oriented to person, place, and time. He appears well-developed and well-nourished.  HENT:  Head: Normocephalic and atraumatic.  Right Ear: External ear normal.  Left Ear: External ear normal.  Nose: Nose normal.  Eyes: Right eye exhibits no discharge. Left eye exhibits no discharge.  Neck: Neck supple.  Cardiovascular: Normal rate, regular rhythm and normal  heart sounds.   Pulmonary/Chest: Effort normal and breath sounds normal.  Abdominal: Soft. He exhibits no mass. There is no tenderness.  Genitourinary:  Genitourinary Comments: Soft but a lot of stool in rectal vault.   Musculoskeletal: He exhibits no edema.  Neurological: He is alert and oriented to person, place, and time.  Skin: Skin is warm and dry.  Nursing note and vitals reviewed.    ED Treatments / Results  Labs (all labs ordered are listed, but only abnormal results are displayed) Labs Reviewed  CBC WITH DIFFERENTIAL/PLATELET - Abnormal; Notable for the following:       Result Value   WBC 16.2 (*)    RDW 16.0 (*)    Neutro Abs 12.6 (*)    Monocytes Absolute 1.1 (*)    All other components within normal limits  COMPREHENSIVE METABOLIC PANEL - Abnormal; Notable for the following:    Glucose, Bld 132 (*)    All other components within normal limits    EKG  EKG Interpretation None       Radiology No results found.  Procedures Procedures (including critical care time)  Medications Ordered in ED Medications - No data to display   Initial Impression / Assessment and Plan / ED Course  I have reviewed the triage vital signs and the nursing notes.  Pertinent labs & imaging results that were available during my care of the patient were reviewed by me  and considered in my medical decision making (see chart for details).  Clinical Course as of Oct 10 202  Mon Oct 09, 2016  2128 Discussed trying manual disimpaction but patient wants to try enema.  [SG]  2221 Very large BM. Completely asymptomatic now.  [SG]    Clinical Course User Index [SG] Sherwood Gambler, MD    Patient had multiple very large BMs and now is completely asymptomatic. Likely the constipation is from narcotic use. Electrolytes unremarkable. WBC is elevated, could be stress response given amount of distress he was in from discomfort. No obvious sign of infection. Bowel regimen, d/c home with pcp f/u and return precautions. On repeat exam his abd remains non-tender  Final Clinical Impressions(s) / ED Diagnoses   Final diagnoses:  Fecal impaction in rectum [facility])    New Prescriptions Discharge Medication List as of 10/09/2016 10:21 PM    START taking these medications   Details  docusate sodium (COLACE) 100 MG capsule Take 1 capsule (100 mg total) by mouth every 12 (twelve) hours., Starting Mon 10/09/2016, Print         Sherwood Gambler, MD 10/10/16 [phone]

## 2016-10-09 NOTE — ED Triage Notes (Signed)
Pt reports constipation post 6 weeks of narcotics treatment for shoulder injury. Distended abdomen per EMS

## 2016-10-09 NOTE — ED Notes (Signed)
Pt's wife stated he had robaxin at 1500.

## 2016-10-09 NOTE — ED Notes (Signed)
Pt requested this RN go physically let a doctor know he is here and his symptoms.  Vladimir FasterMade Kelly, PA aware.

## 2016-10-09 NOTE — ED Notes (Signed)
Pt continues to be uncomfortable.  Wife comes out and voices her worry, EDP notified of situation and new orders received.

## 2016-10-09 NOTE — ED Notes (Signed)
Pt wife is very upset, she is on the phone calling for an ambulance to transport [facility].  Pt notified that we are working on a room, wife is crying and upset with me, she states that she is leaving with him and taking him to [facility].  Notified her that there are 27 people in the waiting area and I worry about him having to be more uncomfortable and wait even longer... This seemed to make wife even more upset.  Pt describes a sensation of hard stool at the rectum and is in discomfort with this

## 2016-10-13 ENCOUNTER — Other Ambulatory Visit: Payer: Self-pay

## 2016-10-13 MED ORDER — METHOCARBAMOL 500 MG PO TABS: 500.0000 mg | ORAL_TABLET | Freq: Four times a day (QID) | ORAL | 0 refills | Status: AC | PRN

## 2016-10-17 ENCOUNTER — Telehealth (INDEPENDENT_AMBULATORY_CARE_PROVIDER_SITE_OTHER): Payer: Self-pay | Admitting: Orthopaedic Surgery

## 2016-10-17 NOTE — Telephone Encounter (Signed)
Ok for note 

## 2016-10-17 NOTE — Telephone Encounter (Signed)
Needs to be seen I have not seen him since he was in ICU [facility]

## 2016-10-17 NOTE — Telephone Encounter (Signed)
[facility] requesting release on behalf of pt as he is ready to go back to work. She asked if we could fax straight to his work.  fax number is [phone] attn: tammy for [employer].   Pt number Is [phone]

## 2016-10-19 ENCOUNTER — Encounter
Payer: BLUE CROSS/BLUE SHIELD | Attending: Physical Medicine & Rehabilitation | Admitting: Physical Medicine & Rehabilitation

## 2016-10-19 ENCOUNTER — Encounter: Payer: Self-pay | Admitting: Physical Medicine & Rehabilitation

## 2016-10-19 VITALS — BP 109/78 | HR 93

## 2016-10-19 DIAGNOSIS — J84112 Idiopathic pulmonary fibrosis: Secondary | ICD-10-CM

## 2016-10-19 DIAGNOSIS — G931 Anoxic brain damage, not elsewhere classified: Secondary | ICD-10-CM | POA: Insufficient documentation

## 2016-10-19 DIAGNOSIS — F411 Generalized anxiety disorder: Secondary | ICD-10-CM | POA: Insufficient documentation

## 2016-10-19 DIAGNOSIS — R5381 Other malaise: Secondary | ICD-10-CM | POA: Diagnosis not present

## 2016-10-19 DIAGNOSIS — G8918 Other acute postprocedural pain: Secondary | ICD-10-CM | POA: Diagnosis not present

## 2016-10-19 DIAGNOSIS — M75121 Complete rotator cuff tear or rupture of right shoulder, not specified as traumatic: Secondary | ICD-10-CM

## 2016-10-19 DIAGNOSIS — M791 Myalgia, unspecified site: Secondary | ICD-10-CM

## 2016-10-19 DIAGNOSIS — J449 Chronic obstructive pulmonary disease, unspecified: Secondary | ICD-10-CM

## 2016-10-19 MED ORDER — CITALOPRAM HYDROBROMIDE 20 MG PO TABS: 20.0000 mg | ORAL_TABLET | Freq: Every day | ORAL | 0 refills | Status: DC

## 2016-10-19 MED ORDER — ALPRAZOLAM 1 MG PO TABS
1.0000 mg | ORAL_TABLET | Freq: Three times a day (TID) | ORAL | 0 refills | Status: AC | PRN
Start: 2016-10-19 — End: ?

## 2016-10-19 NOTE — Progress Notes (Signed)
Subjective:    Patient ID: Bradley Fraiseavid M [name], male    DOB: Jul 19, 1961, 55 y.o.   MRN: [id number]  HPI  55 year old male with h/o of COPD, interstitial pulmonary fibrosis, DOE, anxiety disorder, right shoulder arthroscopy 08/14/16 presents for hospital follow up after acute hypoxic/hypercarbic respiratory failure.   Since discharge, he has been having some anxiety.  He believes it is because he is bored at home.  He is looking to return to work.  He has not seen Dr. Ophelia CharterYates.  He has weaned off of narcotics.  He continues to take Gabapentin and IBU as needed.  He is still taking prednisone.  He sees Pulm in 2 weeks.  His CBGs have been 90-150.  His swelling has resolved and he has d/ced lasix.  He has quit tobacco.    Pain Inventory Average Pain 1 Pain Right Now 1 My pain is tingling  In the last 24 hours, has pain interfered with the following? General activity 4 Relation with others 0 Enjoyment of life 4 What TIME of day is your pain at its worst? evening Sleep (in general) Good  Pain is worse with: . Pain improves with: therapy/exercise and medication Relief from Meds: 6  Mobility walk without assistance ability to climb steps?  yes do you drive?  yes  Function employed # of hrs/week 30  Neuro/Psych tremor depression anxiety  Prior Studies Any changes since last visit?  no  Physicians involved in your care Any changes since last visit?  no   Family History  Problem Relation Age of Onset  . Heart disease Mother   . Colon cancer Mother    Social History   Social History  . Marital status: Married    Spouse name: N/A  . Number of children: N/A  . 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 Smoking status: Current Every Day Smoker    Packs/day: 1.00    Years: 30.00    Types: Cigarettes  . Smokeless tobacco: Never Used  . Alcohol use No  . Drug use: No  . Sexual activity: Not Asked   Other  Topics Concern  . None   Social History Narrative  . None   Past Surgical History:  Procedure Laterality Date  . COLONOSCOPY W/ POLYPECTOMY    . LITHOTRIPSY    . MULTIPLE TOOTH EXTRACTIONS     19 teeth extracted at once  . SHOULDER ARTHROSCOPY WITH ROTATOR CUFF REPAIR Right 08/14/2016   Procedure: RIGHT SHOULDER ARTHROSCOPY,DEBRIDEMENT, OPEN ROTATOR CUFF REPAIR;  Surgeon: Eldred MangesMark C Yates, MD;  Location: [facility] OR;  Service: Orthopedics;  Laterality: Right;  . TONSILLECTOMY     Past Medical History:  Diagnosis Date  . Anxiety    Citalopram controls "panic attacks"  . Asthma   . COPD (chronic obstructive pulmonary disease) (HCC)   . Diabetes mellitus type 2 without retinopathy (HCC)   . Dyspnea    with exertion  . History of kidney stones    x3-4 - lithotripsy  . Pneumonia    08/11/16- 3 years ago  . Pulmonary fibrosis (HCC)    BP 109/78 (BP Location: Left Arm, Patient Position: Sitting, Cuff Size: Large)   Pulse 93   SpO2 94%   Opioid Risk Score:   Fall Risk Score:  `1  Depression screen PHQ 2/9  Depression screen PHQ 2/9 10/19/2016  Decreased Interest 3  Down, Depressed, Hopeless 1  PHQ -  2 Score 4  Altered sleeping 0  Tired, decreased energy 1  Change in appetite 0  Feeling bad or failure about yourself  3  Trouble concentrating 2  Moving slowly or fidgety/restless 0  Suicidal thoughts 0  PHQ-9 Score 10  Difficult doing work/chores Somewhat difficult   Review of Systems  Constitutional: Negative.   HENT: Negative.   Eyes: Negative.   Respiratory: Negative.   Cardiovascular: Negative.   Gastrointestinal: Negative.   Endocrine: Negative.   Genitourinary: Negative.   Musculoskeletal: Positive for arthralgias and joint swelling.  Skin: Negative.   Allergic/Immunologic: Negative.   Neurological: Positive for tremors.  Hematological: Negative.   Psychiatric/Behavioral: Positive for dysphoric mood. The patient is nervous/anxious.   All other systems reviewed  and are negative.     Objective:   Physical Exam  Constitutional: NAD. Vital signs reviewed. Well-developed.  HENT:  [place]. NT.  Eyes: EOMI. No discharge.  Neck: Stoma healed. Cardiovascular: RRR. No JVD. Respiratory: CTA b/l. Unlabored. GI: BS+, NT/ND Musculoskeletal: He exhibits no and tenderness.  Neurological: He is alertand oriented.  Motor: 5/5 throughout, except right shoulder limited but improving Skin: Skin is warmand dry.  Psychiatric: pleasant. cooperative    Assessment & Plan:  55 year old male with h/o of COPD, interstitial pulmonary fibrosis, DOE, anxiety disorder, right shoulder arthroscopy 08/14/16 presents for hospital follow up after acute hypoxic/hypercarbic respiratory failure.    1. Debility after rotator cuff repair and resp complications  Cont therapies - 2/week  Cont follow up with Ortho  Cont follow up with Pulm  Pt plans to return to work, will follow up with Ortho  2. Pain Management  Cont Neurontin  Cont ibuprofen as needed  3. Myalgias   Cont Robaxin  4. COPD with IPF  Cont Prednisone per Pulm  Cont to follow with Pulm  5. Anxiety:   Cont Celexa, reordered  Cont xanax prn, reordered

## 2016-10-19 NOTE — Telephone Encounter (Signed)
I left voicemail for patient advising he needs to make follow up appt in office prior to being released. I did advise they have opened up time in the office for Dr. Ophelia CharterYates tomorrow morning if he would like to call to be put on the schedule.

## 2016-10-20 ENCOUNTER — Ambulatory Visit (INDEPENDENT_AMBULATORY_CARE_PROVIDER_SITE_OTHER): Payer: Self-pay | Admitting: Orthopaedic Surgery

## 2016-10-20 VITALS — BP 110/80 | HR 80 | Temp 97.3°F

## 2016-10-20 DIAGNOSIS — J9601 Acute respiratory failure with hypoxia: Secondary | ICD-10-CM

## 2016-10-20 DIAGNOSIS — M75121 Complete rotator cuff tear or rupture of right shoulder, not specified as traumatic: Secondary | ICD-10-CM

## 2016-10-20 NOTE — Progress Notes (Signed)
Post-Op Visit Note   Patient: Bradley FraiseDavid M Herard           Date of Birth: [birth year]           MRN: [id number] Visit Date: 10/20/2016 PCP: Georgann HousekeeperHUSAIN,KARRAR, MD   Assessment & Plan:  Chief Complaint: Patient is here today for his first post operative appointment. He ended up being admitted to the hospital a day after surgery with double pneumonia and was in the hospital for 7 weeks. He states his shoulder is doing well, he is working on ROM. Chief Complaint  Patient presents with  . Right Shoulder - Routine Post Op   Visit Diagnoses:  1. Complete tear of right rotator cuff     Plan: Patient can get his arm up over his head he has some overall generalized weakness from multiple day stay in the ICU with intubation. He had pneumonia respiratory failure. This occurred postoperatively after shoulder surgery when he is taking pain medication and doing lots smoking. He has since quit smoking we discussed the continue walking program and gradual strengthening he's got a pulley system is working on. I'll recheck him in one month.  Follow-Up Instructions: No Follow-up on file.   Orders:  No orders of the defined types were placed in this encounter.  No orders of the defined types were placed in this encounter.    PMFS History: Patient Active Problem List   Diagnosis Date Noted  . Post-operative pain 10/19/2016  . Peripheral edema   . Anxiety state   . Dysphagia   . Hypokalemia   . Generalized anxiety disorder   . Leukocytosis   . Diabetes mellitus type 2 without retinopathy (HCC)   . Debility 09/15/2016  . Anoxic encephalopathy (HCC)   . Tracheostomy status (HCC)   . ARDS (adult respiratory distress syndrome) (HCC)   . Acute respiratory failure with hypoxia (HCC) 08/18/2016  . Community acquired pneumonia 08/18/2016  . COPD exacerbation (HCC) 08/18/2016  . Depression 08/18/2016  . Sepsis (HCC) 08/18/2016  . Complete tear of right rotator cuff 08/14/2016  . Right rotator cuff tear  08/14/2016  . Cigarette smoker 07/23/2015  . Obesity 07/23/2015  . Pulmonary fibrosis (HCC) 07/22/2015   Past Medical History:  Diagnosis Date  . Anxiety    Citalopram controls "panic attacks"  . Asthma   . COPD (chronic obstructive pulmonary disease) (HCC)   . Diabetes mellitus type 2 without retinopathy (HCC)   . Dyspnea    with exertion  . History of kidney stones    x3-4 - lithotripsy  . Pneumonia    08/11/16- 3 years ago  . Pulmonary fibrosis (HCC)     Family History  Problem Relation Age of Onset  . Heart disease Mother   . Colon cancer Mother     Past Surgical History:  Procedure Laterality Date  . COLONOSCOPY W/ POLYPECTOMY    . LITHOTRIPSY    . MULTIPLE TOOTH EXTRACTIONS     19 teeth extracted at once  . SHOULDER ARTHROSCOPY WITH ROTATOR CUFF REPAIR Right 08/14/2016   Procedure: RIGHT SHOULDER ARTHROSCOPY,DEBRIDEMENT, OPEN ROTATOR CUFF REPAIR;  Surgeon: Eldred MangesMark C Yates, MD;  Location: [facility] OR;  Service: Orthopedics;  Laterality: Right;  . TONSILLECTOMY     Social History   Occupational History  . Maintenence Manager    Social History Main Topics  . Smoking status: Current Every Day Smoker    Packs/day: 1.00    Years: 30.00    Types: Cigarettes  . Smokeless tobacco:  Never Used  . Alcohol use No  . Drug use: No  . Sexual activity: Not on file

## 2016-10-24 ENCOUNTER — Other Ambulatory Visit: Payer: Self-pay | Admitting: Physical Medicine & Rehabilitation

## 2016-10-25 ENCOUNTER — Telehealth: Payer: Self-pay | Admitting: Physical Medicine & Rehabilitation

## 2016-10-25 NOTE — Telephone Encounter (Signed)
He called states he and Allena Katzatel talked a bout 1 mg xanax refill but pharmacy does not have

## 2016-10-26 NOTE — Telephone Encounter (Signed)
Mr Bradley [name] got #45 alprazolam filled by his PCP Dr Eula ListenHussain.  It looks like you ordered # 90 at his last appt but it was on "call in" and doesn't appear to have been ordered according to [facility]. This is just an [place] message.

## 2016-11-21 ENCOUNTER — Ambulatory Visit (INDEPENDENT_AMBULATORY_CARE_PROVIDER_SITE_OTHER): Payer: Self-pay | Admitting: Orthopaedic Surgery

## 2016-12-06 ENCOUNTER — Other Ambulatory Visit (INDEPENDENT_AMBULATORY_CARE_PROVIDER_SITE_OTHER): Payer: BLUE CROSS/BLUE SHIELD

## 2016-12-06 ENCOUNTER — Ambulatory Visit (INDEPENDENT_AMBULATORY_CARE_PROVIDER_SITE_OTHER): Payer: BLUE CROSS/BLUE SHIELD | Admitting: Internal Medicine

## 2016-12-06 ENCOUNTER — Ambulatory Visit (INDEPENDENT_AMBULATORY_CARE_PROVIDER_SITE_OTHER)
Admission: RE | Admit: 2016-12-06 | Discharge: 2016-12-06 | Disposition: A | Discharge status: Home / Self Care | Payer: BLUE CROSS/BLUE SHIELD | Source: Ambulatory Visit | Attending: Internal Medicine | Admitting: Internal Medicine

## 2016-12-06 ENCOUNTER — Encounter: Payer: Self-pay | Admitting: Internal Medicine

## 2016-12-06 VITALS — BP 120/86 | HR 107 | Ht 69.0 in | Wt 196.8 lb

## 2016-12-06 DIAGNOSIS — R0609 Other forms of dyspnea: Secondary | ICD-10-CM | POA: Insufficient documentation

## 2016-12-06 DIAGNOSIS — J8410 Pulmonary fibrosis, unspecified: Secondary | ICD-10-CM | POA: Diagnosis not present

## 2016-12-06 DIAGNOSIS — J449 Chronic obstructive pulmonary disease, unspecified: Secondary | ICD-10-CM

## 2016-12-06 LAB — BASIC METABOLIC PANEL
BUN: 16 mg/dL (ref 6–23)
CALCIUM: 9.6 mg/dL (ref 8.4–10.5)
CHLORIDE: 105 meq/L (ref 96–112)
CO2: 27 meq/L (ref 19–32)
Creatinine, Ser: 0.86 mg/dL (ref 0.40–1.50)
GFR: 97.81 mL/min (ref >60.00)
Glucose, Bld: 143 mg/dL — ABNORMAL HIGH (ref 70–99)
Potassium: 4.9 mEq/L (ref 3.5–5.1)
SODIUM: 141 meq/L (ref 135–145)

## 2016-12-06 LAB — CBC WITH DIFFERENTIAL/PLATELET
BASOS PCT: 0.6 % (ref 0.0–3.0)
Basophils Absolute: 0.1 10*3/uL (ref 0.0–0.1)
EOS ABS: 0.1 10*3/uL (ref 0.0–0.7)
Eosinophils Relative: 1 % (ref 0.0–5.0)
HEMATOCRIT: 47.6 % (ref 39.0–52.0)
HEMOGLOBIN: 15.7 g/dL (ref 13.0–17.0)
LYMPHS PCT: 14.4 % (ref 12.0–46.0)
Lymphs Abs: 1.8 10*3/uL (ref 0.7–4.0)
MCHC: 33 g/dL (ref 30.0–36.0)
MCV: 91 fl (ref 78.0–100.0)
Monocytes Absolute: 0.9 10*3/uL (ref 0.1–1.0)
Monocytes Relative: 7 % (ref 3.0–12.0)
Neutro Abs: 9.5 10*3/uL — ABNORMAL HIGH (ref 1.4–7.7)
Neutrophils Relative %: 77 % (ref 43.0–77.0)
Platelets: 202 10*3/uL (ref 150.0–400.0)
RBC: 5.23 Mil/uL (ref 4.22–5.81)
RDW: 15.4 % (ref 11.5–15.5)
WBC: 12.4 10*3/uL — ABNORMAL HIGH (ref 4.0–10.5)

## 2016-12-06 LAB — BRAIN NATRIURETIC PEPTIDE: PRO B NATRI PEPTIDE: 42 pg/mL (ref 0.0–100.0)

## 2016-12-06 LAB — SEDIMENTATION RATE: SED RATE: 30 mm/h — AB (ref 0–20)

## 2016-12-06 LAB — TSH: TSH: 1.71 u[IU]/mL (ref 0.35–4.50)

## 2016-12-06 MED ORDER — PANTOPRAZOLE SODIUM 40 MG PO TBEC: 40.0000 mg | DELAYED_RELEASE_TABLET | Freq: Every day | ORAL | 2 refills | Status: DC

## 2016-12-06 MED ORDER — FAMOTIDINE 20 MG PO TABS: ORAL_TABLET | ORAL | 11 refills | Status: AC

## 2016-12-06 MED ORDER — BUDESONIDE-FORMOTEROL FUMARATE 160-4.5 MCG/ACT IN AERO: 2.0000 | INHALATION_SPRAY | Freq: Two times a day (BID) | RESPIRATORY_TRACT | 0 refills | Status: AC

## 2016-12-06 MED ORDER — BISOPROLOL FUMARATE 5 MG PO TABS: 5.0000 mg | ORAL_TABLET | Freq: Every day | ORAL | 11 refills | Status: AC

## 2016-12-06 NOTE — Patient Instructions (Addendum)
Stop advair/ stop normodyne and start bisoprolol 5 mg daily as your new blood pressure medication   Pantoprazole (protonix) 40 mg   Take  30-60 min before first meal of the day and Pepcid (famotidine)  20 mg one @  bedtime until return to office - this is the best way to tell whether stomach acid is contributing to your problem.   GERD (REFLUX)  is an extremely common cause of respiratory symptoms just like yours , many times with no obvious heartburn at all.    It can be treated with medication, but also with lifestyle changes including elevation of the head of your bed (ideally with 6 inch  bed blocks),  Smoking cessation, avoidance of late meals, excessive alcohol, and avoid fatty foods, chocolate, peppermint, colas, red wine, and acidic juices such as orange juice.  NO MINT OR MENTHOL PRODUCTS SO NO COUGH DROPS  USE SUGARLESS CANDY INSTEAD (Jolley ranchers or Stover's or Life Savers) or even ice chips will also do - the key is to swallow to prevent all throat clearing. NO OIL BASED VITAMINS - use powdered substitutes.    Plan A = Automatic = Symbicort 160 Take 2 puffs first thing in am and then another 2 puffs about 12 hours later.     Plan B = Backup Only use your albuterol as a rescue medication to be used if you can't catch your breath by resting or doing a relaxed purse lip breathing pattern.  - The less you use it, the better it will work when you need it. - Ok to use the inhaler up to 2 puffs  every 4 hours if you must but call for appointment if use goes up over your usual need - Don't leave home without it !!  (think of it like the spare tire for your car)   Plan C = Crisis - only use your xopenex nebulizer if you first try Plan B and it fails to help > ok to use the nebulizer up to every 4 hours but if start needing it regularly call for immediate appointment    Reduce prednisone to 10 mg daily   Please remember to go to the lab and x-ray department downstairs in the basement   for your tests - we will call you with the results when they are available.  See Tammy NP w/in 2 weeks with all your medications, even over the counter meds, separated in two separate bags, the ones you take no matter what vs the ones you stop once you feel better and take only as needed when you feel you need them.   Tammy  will generate for you a new user friendly medication calendar that will put us all on the same page re: your medication use.     Without this process, it simply isn't possible to assure that we are providing  your outpatient care  with  the attention to detail we feel you deserve.   If we cannot assure that you're getting that kind of care,  then we cannot manage your problem effectively from this clinic.  Once you have seen Tammy and we are sure that we're all on the same page with your medication use she will arrange follow up with me.

## 2016-12-06 NOTE — Progress Notes (Signed)
Subjective:    Patient ID: Bradley [name], male    DOB: June 26, 1961,    MRN: [id number]    Brief patient profile:  55 yowm quit smoking around 1st of Oct 2016    with h/o allergies as child just responeded to freq rx with chlortrimeton and improved until developed pattern of "bad  Colds" onset  in the fall x 2012 initially responded to abx and prednisone and rx with maint advair and rare prn saba with no chronic symptoms in between flares until 2014 with persistent doe/cough x 2014 despite maint advair  referred 07/22/2015  by Dr [name].    History of Present Illness  07/22/2015 1st Bradley [name] office visit/ Bradley [name]   Chief Complaint  Patient presents with  . [name] Consult    Referred by Dr. Tyson [name]. Pt c/o cough x 3 months.  Cough is prod with clear to light yellow sputum and seems esp worse in the am and in the evening. He has noticed cough is triggered by smoking and with exertion.  He c/o DOE for approx 6 months- occurs when doing yard work but does ok on flat surface at normal pace.   the onset / pattern of his symptoms very difficult to pin down but the last 3 months have been the worst and most persistent cough sob since onset of this recurrent problem in 2012 and are assoc with overt HB rec Stop smoking at all costs Pantoprazole (protonix) 40 mg   Take  30-60 min before first meal of the day and Pepcid (famotidine)  20 mg one @  bedtime until return to office - this is the best way to tell whether stomach acid is contributing to your problem.   GERD diet    09/06/2015  f/u ov/Bradley [name] re: ? Pf/ stopped smoking     Chief Complaint  Patient presents with  . Follow-up    PFT done today.  Pt states that his cough and SOB are much improved. He has quit smoking.   Hunting is his is hobby / [place] is ok / no aerobics but Not limited by breathing from desired activities   rec Stay as active as you can and let us know if you are losing any ground with  exercise tolerance Ok to hold off the acid suppression for now but resume if worse breathing, coughing or obvious heartburn     Admit date: 08/17/2016 Discharge date: 09/08/2016 > to rehab > d/c from rehab 09/28/16     Discharge Diagnoses:  -COPD -[name] fibrosis -Tobacco use disorder -Muscle weakness                                                                                                                              DISCHARGE PLAN BY DIAGNOSIS      Discharge Plan: -Respiratory failure status post tracheostomy: wean off trach as tolerated -COPD: continue home medications  -Tobacco use disorder: counsel  on tobacco use  -Muscle weakness: PT                                                                        DISCHARGE SUMMARY   Bradley [name] is a 56 y.o. y/o male with a PMH of COPD, [name] fibrosis, depression/anxiety and tobacco use disorder.  HPI:  56 year old smoker, with PFT nov 2016 showing isolated reduction in dlco to 59% and quite functional and not on home o2. Followed by Dr Sherene SiresWert - last seen Sept/Nov 2016 based on CT chest evidence showing diffuse changes of centrilobular and paraseptal emphysema along with prominent interstitial markings primarily in the upper lobes consistent with [name] fibrosis. This was a non--UIP pattern. Patient recommended quitting smoking because of concerns of DIP. According to the patient and his wife, has continued to smoke. He has been in his baseline status of health and very functional.   Hospital course:  On 08/14/2016, patient had a preoperative chest x-ray for shoulder surgery that only showed chronic changes without change. He had right shoulder arthroscopy, debridement and open rotator cuff repair for right shoulder partial biceps tendon tear and complete rotator cuff tear. The procedure was uneventful. Postoperative course was uneventful. There was no vomiting.   On 08/16/2016, patient started  getting acutely ill with shortness of breath and cough with brown colored sputum. Then admitted to the hospital. Initial white count was 13,000. BNP was normal. CT angiogram chest ruled out [name] embolism but showed diffuse ground glass opacities and enlarged mediastinal node measuring close to 2 cm. At baseline he denies any mold exposure or mildew exposure or working with dust. He denies any birds in the house. He continues to smoke. He was started on community acquired pneumonia antibiotics but has developed progressive acute respiratory failure necessitating Solu-Medrol since 08/18/2016 evening.   On 08/19/2016,  he was started on BiPAP due to significant respiratory distress. However, patient continue to have respiratory distress on BIPAP and transferred to ICU and got intubated.   Respiratory:  In ICU, patient was intubated for acute hypoxemic respiratory failure/hypercapnic respiratory acidosis with significant deterioration 3 days after right shoulder surgery. He was started on ventilation for ARDS net protocol with Nimbex. Nimbex discontinued on 08/22/2016. Although his acidosis improved, he could not come off mechanical ventilation completely which necessitated percutaneous tracheostomy on 09/04/2016.   As of 11/6, patient is on tracheostomy collar during the daytime and is no longer requiring pressure control ventilation at night.   Patient is a chronic smoker. PFT on 11/9/ 2016 with isolated reduced DLCO, very minimal irreversible obstructive airways disease and moderate diffusion defect. He has no known ILD exposures per history.   [facility]:  Patient was started on Levophed for hypotension on 08/19/2016. Levophed was discontinued on 08/21/16. Troponin was mildly elevated to 0.07 suggestive for demand ischemia. Echocardiogram on 08/19/2016 with EF 60-65% and PAPP of 56 mm Hg . Otherwise, he remained hemodynamically stable for the rest of his stay.  ID: From infectious standpoint,  patient with leukocytosis and intermittent fever. Fever and leukocytosis trended down and resolved. Respiratory viral panel negative x2, two sets of blood cultures on 08/18/16 and 09/01/16 and urine culture were negative. Bronchoalveolar lavage on 08/22/2016 unremarkable.  Aoutoimmune and vasculitis labs also negative.  Repeat respiratory culture on 09/01/2016 with Enterobacter Aerogenes, which was thought not to be a pathogen because patient improved clinically treated with Zosyn which the organism was resistant to. Patient has a lumbar puncture on 09/06/2016. Cytology, gram stain and culture were negative so far. Fungal culture pending.  He received the following antibiotics:              -Vancomycin:08/19/16-08/27/16, then 11/3/-09/03/16             -Zosyn: 08/19/16-08/28/16, then 09/01/16-09/05/16             -Diflucan 100 mg twice a day 11/7>  Neuro:  Patient developed unexplained encephalopathy (obtundation and without spontaneous movement) on 08/30/2016 that didn't improve by holding his sedating medications. Patient was started on Solumedrol taper. He is currently on day 20 of 21 at 5 mg daily. Neurology was consulted. MRI brain was obtained and negative for acute intracranial process. EEG read as moderate generalized slowing of brain activity with unexplained etiology. MRI of cervical spine with some degenerative changes and moderate left foraminal narrowing at C5-6. Patient has a fluoroscopy guided lumbar puncture on 09/06/2016. Cytology, gram stain and culture were negative so far. Fungal culture pending.  On 09/07/2016, patient started to move his arms a little bit more. 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 Neurology recommended against further testing and signed off. Physical therapy was started while patient was in the MICU.    SIGNIFICANT DIAGNOSTIC STUDIES -Microbiology data as below -08/17/2016: CTA negative for  PE, with worsened diffuse bilateral regional ground-glass airspace opacification, with underlying emphysema and interstitial prominence and enlarged mediastinal nodes, measuring up to 1.9 cm in short axis  -08/19/2016: Echocardiogram with EF 60-65% and PAPP of 56 mm Hg  -09/01/2016: bilateral ABIs and TBIs are within normal limits at rest.  -08/23/2016: CT head without acute intracranial process.   -08/30/2016: MRI brain without acute process, normal for age.   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 Also moderate left foraminal narrowing at C5-6 .   -08/31/2016: EEG: with evidence of moderate generalized slowing of brain activity.   -09/04/2016: EEG: abnormal demonstrating a mild diffuse slowing of electrocerebral activity  MICRO DATA  Blood Cx 08/18/2016 and 09/01/16: NGTD Urine Cx 11/3: NGTD RVP on 08/18/2016 and 08/20/2016: negative. BAL Cx: 08/22/2016: negative Trach Cx 09/01/2016: Abundant GNR, Abundant Enterobacter Aerogenes  ANTIBIOTICS -Vancomycin:08/19/16-08/27/16, then 11/3/-09/03/16 -Zosyn: 08/19/16-08/28/16, then 09/01/16-09/05/16 -Diflucan 100 mg twice a day 11/7   CONSULTS -Neurology -Vascular out of concern for PAD  TUBES / LINES Rectal tube/pouch:  Tracheostomy Shiley 6 mm Cuffed. 09/04/2016>>  PICC Double Lumen  in Right Brachial 41 cm 0 cm. 09/02/16>>  Nasoenteric Feeding Tube Cortrak - 43 inches 10 Fr. In Left nare.  09/04/2016>>   12/06/2016  Extended f/u ov/Bradley [name] re: re-establish for PF /  On advair 250 / pred  since oct 2017 on 20 mg since Dec 2107 / quit smoking oct 2017  Chief Complaint  Patient presents with  . [name] Consult    Referred by Dr Eula Listen. Pt states he had respiratory infection approx 1 month. He states having increased DOE with short distances such as walking from  lobby to exam room today.   doe = MMRC2 = can't walk  a nl pace on a flat grade s sob but does fine slow and flat eg  [employer] [place] ok but slower going back to house x incline s stopping but sob   Much worse since mid Dec 2017 dx bronchitis and pred increased to 20 mg daily from taper he'd been on since above admit  Not clearly improving on advair or saba      No obvious day to day or daytime variability or assoc chronic cough or cp or chest tightness, subjective wheeze or overt sinus or hb symptoms. No unusual exp hx or h/o childhood pna/ asthma or knowledge of premature birth.  Sleeping ok without nocturnal  or early am exacerbation  of respiratory  c/o's or need for noct saba. Also denies any obvious fluctuation of symptoms with weather or environmental changes or other aggravating or alleviating factors except as outlined above   Current Medications, Allergies, Complete Past Medical History, Past Surgical History, Family History, and Social History were reviewed in [employer] record.  ROS  The following are not active complaints unless bolded sore throat, dysphagia, dental problems, itching, sneezing,  nasal congestion ? Allegra D dep? or excess/ purulent secretions, ear ache,   fever, chills, sweats, unintended wt loss, classically pleuritic or exertional cp, hemoptysis,  orthopnea pnd or leg swelling, presyncope, palpitations, abdominal pain, anorexia, nausea, vomiting, diarrhea  or change in bowel or bladder habits, change in stools or urine, dysuria,hematuria,  rash, arthralgias, visual complaints, headache, numbness, weakness or ataxia or problems with walking or coordination,  change in mood/affect or memory.               Objective:  Physical Exam   amb obese wm nad   Wt Readings from Last 3 Encounters:  12/06/16 196 lb 12.8 oz (89.3 kg)  10/09/16 178 lb (80.7 kg)  09/28/16 165 lb 5.5 oz (75 kg)    Vital signs reviewed  - Note on arrival 02 sats   97% on RA      HEENT: nl   turbinates, and orophanx. Nl external ear canals without cough reflex - upper and lower dentures    NECK :  without JVD/Nodes/TM/ nl carotid upstrokes bilaterally/ trach site well healed    LUNGS: no acc muscle use, clear to A and P bilaterally without cough on insp or exp maneuvers   CV:  RRR  no s3 or murmur or increase in P2, no edema   ABD:  soft and nontender with nl excursion in the supine position. No bruits or organomegaly, bowel sounds nl  MS:  warm without deformities, calf tenderness, cyanosis or clubbing  SKIN: warm and dry without lesions    NEURO:  alert, approp, no deficits      CXR PA and Lateral:   12/06/2016 :    I personally reviewed images and agree with radiology impression as follows:    stable diffuse mild/moderate fibrotic changes / overall aeration better    Labs ordered/ reviewed:      Chemistry      Component Value Date/Time   NA 141 12/06/2016 1713   K 4.9 12/06/2016 1713   CL 105 12/06/2016 1713   CO2 27 12/06/2016 1713   BUN 16 12/06/2016 1713   CREATININE 0.86 12/06/2016 1713      Component Value Date/Time   CALCIUM 9.6 12/06/2016 1713   ALKPHOS 52 10/09/2016 2008   AST 24 10/09/2016 2008   ALT 28 10/09/2016 2008   BILITOT 1.1  10/09/2016 2008        Lab Results  Component Value Date   WBC 12.4 (H) 12/06/2016   HGB 15.7 12/06/2016   HCT 47.6 12/06/2016   MCV 91.0 12/06/2016   PLT 202.0 12/06/2016     Lab Results  Component Value Date   DDIMER 1.13 (H) 08/17/2016   with neg CTa  08/17/16    Lab Results  Component Value Date   TSH 1.71 12/06/2016     Lab Results  Component Value Date   PROBNP 42.0 12/06/2016       Lab Results  Component Value Date   ESRSEDRATE 30 (H) 12/06/2016                Assessment & Plan:

## 2016-12-07 DIAGNOSIS — J449 Chronic obstructive pulmonary disease, unspecified: Secondary | ICD-10-CM | POA: Insufficient documentation

## 2016-12-07 LAB — D-DIMER, QUANTITATIVE (NOT AT ARMC): D DIMER QUANT: 0.52 ug{FEU}/mL — AB (ref <0.50)

## 2016-12-07 NOTE — Progress Notes (Signed)
Spoke with pt and notified of results per Dr. Wert. Pt verbalized understanding and denied any questions. 

## 2016-12-07 NOTE — Assessment & Plan Note (Addendum)
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> stopped smoking 07/2016  - PFT's 09/06/2015 VC 3.93 (8%) s obst and DLCO 59 corrects to 72% and ERV 23%  - 12/06/2016   Walked RA  2 laps @ 185 ft each stopped due to  desat to 84% nl pace >declined 02  - Spirometry 12/06/2016  FVC  3.84 (81%) / esr 30 on 20 mg daily pred since late Oct 2017 > reduce to 10 mg daily   The goal with a chronic steroid dependent illness is always arriving at the lowest effective dose that controls the disease/symptoms and not accepting a set "formula" which is based on statistics or guidelines that don't always take into account patient  variability or the natural hx of the dz in every individual patient, which may well vary over time.  For now therefore I recommend the patient maintain  20 mg per day ceiling and 10 mg daily floor.   In addition, Use of PPI is associated with improved survival time and with decreased radiologic fibrosis per King's study published in [facility] vol 184 p1390.  Dec 2011 and also may have other beneficial effects as per the latest review in [place] vol 193 T0903 Jun 20016.  This may not always be cause and effect, but given how universally unimpressive and expensive  all the other  Drugs developed to day  have been for pf,   rec start  rx ppi / diet/ lifestyle modification and f/u with serial walking sats and lung volumes for now to put more points on the curve / establish firm baseline before considering additional measures.

## 2016-12-07 NOTE — Assessment & Plan Note (Signed)
No evidence of chf/ thryroid dz but clearly has ex assoc hypoxemia that is disproportionate to findings so if d dimer is not back down from admit will need repeat CTa next

## 2016-12-07 NOTE — Assessment & Plan Note (Addendum)
Quit smoking 07/2016 Spirometry 12/06/2016  FEV1 2.37 (65%)  Ratio 62 on advair 250/ normodyne > changed to symb 160/bisoprolol    DDX of  difficult airways management almost all start with A and  include Adherence, Ace Inhibitors, Acid Reflux, Active Sinus Disease, Alpha 1 Antitripsin deficiency, Anxiety masquerading as Airways dz,  ABPA,  Allergy(esp in young), Aspiration (esp in elderly), Adverse effects of meds,  Active smokers, A bunch of PE's (a small clot burden can't cause this syndrome unless there is already severe underlying pulm or vascular dz with poor reserve) plus two Bs  = Bronchiectasis and Beta blocker use..and one C= CHF  Adherence is always the initial "prime suspect" and is a multilayered concern that requires a "trust but verify" approach in every patient - starting with knowing how to use medications, especially inhalers, correctly, keeping up with refills and understanding the fundamental difference between maintenance and prns vs those medications only taken for a very short course and then stopped and not refilled.  - needs to return with all meds for med reconciliation. The principal here is that until we are certain that the  patients are doing what we've asked  Them  to do, it makes no sense to ask them to do more - ie the trust but verify approach will work best here.  - 12/06/2016  After extensive coaching HFA effectiveness =    75%   Active smoking > denies  ? Acid (or non-acid) GERD > always difficult to exclude as up to 75% of pts in some series report no assoc GI/ Heartburn symptoms> rec max (24h)  acid suppression and diet restrictions/ reviewed and instructions given in writing.   Adverse effects of dpi > try hfa instead  ? A bunch of PE's >  Check D dimer vs baseline.  while  A nl valute  may miss small peripheral pe, the clot burden with sob is moderately high and the d dimer has a very high neg pred value in this setting    Beta blocker effects > see hbp/ needs  off normodyne Need to repeat on symb and off non-specific beta blockers   I had an extended discussion with the patient reviewing all relevant studies completed to date and  lasting 25 minutes of a 40  minute post hosp visit re transition of care for multiple   non-specific but potentially very serious pulmonary symptoms/problems of unknown etiology.  Each maintenance medication was reviewed in detail including most importantly the difference between maintenance and prns and under what circumstances the prns are to be triggered using an action plan format that is not reflected in the computer generated alphabetically organized AVS.    Please see AVS for specific instructions unique to this office visit that I personally wrote and verbalized to the the pt in detail and then reviewed with pt  by my nurse highlighting any  changes in therapy recommended at today's visit to their plan of care.

## 2016-12-19 ENCOUNTER — Ambulatory Visit (INDEPENDENT_AMBULATORY_CARE_PROVIDER_SITE_OTHER): Payer: BLUE CROSS/BLUE SHIELD | Admitting: Adult Health

## 2016-12-19 ENCOUNTER — Encounter: Payer: Self-pay | Admitting: Adult Health

## 2016-12-19 DIAGNOSIS — J8410 Pulmonary fibrosis, unspecified: Secondary | ICD-10-CM | POA: Diagnosis not present

## 2016-12-19 DIAGNOSIS — J449 Chronic obstructive pulmonary disease, unspecified: Secondary | ICD-10-CM | POA: Diagnosis not present

## 2016-12-19 NOTE — Patient Instructions (Signed)
Continue on Prednisone 10mg  daily then 12/28/16 decrease 5mg  daily and hold at this dose  Continue on Advair 1 puff Twice daily  , rinse after use.  Follow up Dr. Sherene SiresWert  In 4 weeks and As needed

## 2016-12-19 NOTE — Assessment & Plan Note (Signed)
Will try to slowly taper steroids   Plan  Patient Instructions  Continue on Prednisone 10mg  daily then 12/28/16 decrease 5mg  daily and hold at this dose  Continue on Advair 1 puff Twice daily  , rinse after use.  Follow up Dr. Sherene SiresWert  In 4 weeks and As needed

## 2016-12-19 NOTE — Assessment & Plan Note (Signed)
Improved control   Plan  Patient Instructions  Continue on Prednisone 10mg  daily then 12/28/16 decrease 5mg  daily and hold at this dose  Continue on Advair 1 puff Twice daily  , rinse after use.  Follow up Dr. Sherene SiresWert  In 4 weeks and As needed

## 2016-12-19 NOTE — Progress Notes (Signed)
@Patient  ID: Bradley [name], male    DOB: 11-17-1960, 56 y.o.   MRN: [id number]  Chief Complaint  Patient presents with  . 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 Require prolonged ventilatory support and tracheostomy with later on decannulation.  TEST  Spirometry 12/06/2016  FEV1 2.37 (65%)  Ratio 62   12/19/2016 Follow up : COPD , Pulmonary fibrosis  Patient returns for a two-week follow-up. Patient was seen in office last visit. He's been slowly recovering since his critical illness last fall. With acute hypoxemic and hypercarbic respiratory failure after acute lung injury with ARDS. 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 He has not smoked since 10/2015. Patient was changed from Advair to Symbicort. However, patient says that he did not feel as good on Symbicort and is now back on Advair. He feels that his breathing is improving with decreased cough and shortness of breath. Patient was also decreased on prednisone to 10 mg daily. Patient denies any chest pain, hemoptysis, orthopnea, PND, or increased leg swelling.   Allergies  Allergen Reactions  . Wellbutrin [Bupropion] Cough    Immunization History  Administered Date(s) Administered  . Influenza Split 07/08/2015  . Influenza Whole 08/30/2016  . Pneumococcal-Unspecified 10/31/2011    Past Medical History:  Diagnosis Date  . Anxiety    Citalopram controls "panic attacks"  . Asthma   . COPD (chronic obstructive pulmonary disease) (HCC)   . Diabetes mellitus type 2 without retinopathy (HCC)   . Dyspnea    with exertion  . History of kidney stones    x3-4 - lithotripsy  . Pneumonia    08/11/16- 3 years ago  . Pulmonary fibrosis (HCC)     Tobacco  History: History  Smoking Status  . Former Smoker  . Packs/day: 1.00  . Years: 30.00  . Types: Cigarettes  . Quit date: 08/16/2016  Smokeless Tobacco  . Never Used   Counseling given: Not Answered   Outpatient Encounter Prescriptions as of 12/19/2016  Medication Sig  . albuterol (PROAIR HFA) 108 (90 Base) MCG/ACT inhaler Inhale 2 puffs into the lungs every 6 (six) hours as needed for wheezing or shortness of breath.  . ALPRAZolam (XANAX) 1 MG tablet Take 1 tablet (1 mg total) by mouth 3 (three) times daily as needed for anxiety.  . famotidine (PEPCID) 20 MG tablet One at bedtime  . fexofenadine-pseudoephedrine (ALLEGRA-D) 60-120 MG 12 hr tablet Take 1 tablet by mouth every morning.   . methocarbamol (ROBAXIN) 500 MG tablet Take 1 tablet (500 mg total) by mouth every 6 (six) hours as needed for muscle spasms.  . pantoprazole (PROTONIX) 40 MG tablet Take 1 tablet (40 mg total) by mouth daily. Take 30-60 min before first meal of the day  . polyethylene glycol (MIRALAX / GLYCOLAX) packet Take 17 g by mouth daily.  . predniSONE (DELTASONE) 10 MG tablet Take 10 mg by mouth daily with breakfast.  . sertraline (ZOLOFT) 50 MG tablet Take 1 tablet by mouth daily.  . bisoprolol (ZEBETA) 5 MG tablet Take 1 tablet (5 mg total) by mouth daily. (Patient not taking: Reported on 12/19/2016)  . budesonide-formoterol (SYMBICORT) 160-4.5 MCG/ACT inhaler Inhale 2 puffs into the lungs 2 (two) times daily. (Patient not  taking: Reported on 12/19/2016)  . [DISCONTINUED] albuterol (PROVENTIL HFA;VENTOLIN HFA) 108 (90 BASE) MCG/ACT inhaler Inhale 1 puff into the lungs every 6 (six) hours as needed for wheezing or shortness of breath.  . [DISCONTINUED] docusate sodium (COLACE) 100 MG capsule Take 1 capsule (100 mg total) by mouth every 12 (twelve) hours. (Patient not taking: Reported on 12/19/2016)  . [DISCONTINUED] predniSONE (DELTASONE) 20 MG tablet Take 1 tablet (20 mg total) by mouth daily with breakfast. (Patient  not taking: Reported on 12/19/2016)   No facility-administered encounter medications on file as of 12/19/2016.      Review of Systems  Constitutional:   No  weight loss, night sweats,  Fevers, chills, + fatigue, or  lassitude.  HEENT:   No headaches,  Difficulty swallowing,  Tooth/dental problems, or  Sore throat,                No sneezing, itching, ear ache, nasal congestion, post nasal drip,   CV:  No chest pain,  Orthopnea, PND, swelling in lower extremities, anasarca, dizziness, palpitations, syncope.   GI  No heartburn, indigestion, abdominal pain, nausea, vomiting, diarrhea, change in bowel habits, loss of appetite, bloody stools.   Resp:  No chest wall deformity  Skin: no rash or lesions.  GU: no dysuria, change in color of urine, no urgency or frequency.  No flank pain, no hematuria   MS:  No joint pain or swelling.  No decreased range of motion.  No back pain.    Physical Exam  BP 126/84 (BP Location: Right Arm, Cuff Size: Normal)   Pulse (!) 109   Ht 5\' 9"  (1.753 m)   Wt 196 lb 3.2 oz (89 kg)   SpO2 100%   BMI 28.97 kg/m   GEN: A/Ox3; pleasant , NAD, well nourished    HEENT:  [place]/AT,  EACs-clear, TMs-wnl, NOSE-clear, THROAT-clear, no lesions, no postnasal drip or exudate noted.   NECK:  Supple w/ fair ROM; no JVD; normal carotid impulses w/o bruits; no thyromegaly or nodules palpated; no lymphadenopathy.    RESP  Clear  P & A; w/o, wheezes/ rales/ or rhonchi. no accessory muscle use, no dullness to percussion  CARD:  RRR, no m/r/g, no peripheral edema, pulses intact, no cyanosis or clubbing.  GI:   Soft & nt; nml bowel sounds; no organomegaly or masses detected.   Musco: Warm bil, no deformities or joint swelling noted.   Neuro: alert, no focal deficits noted.    Skin: Warm, no lesions or rashes    Lab Results:  CBC    Component Value Date/Time   WBC 12.4 (H) 12/06/2016 1713   RBC 5.23 12/06/2016 1713   HGB 15.7 12/06/2016 1713   HCT 47.6  12/06/2016 1713   PLT 202.0 12/06/2016 1713   MCV 91.0 12/06/2016 1713   MCH 30.7 10/09/2016 2008   MCHC 33.0 12/06/2016 1713   RDW 15.4 12/06/2016 1713   LYMPHSABS 1.8 12/06/2016 1713   MONOABS 0.9 12/06/2016 1713   EOSABS 0.1 12/06/2016 1713   BASOSABS 0.1 12/06/2016 1713    BMET    Component Value Date/Time   NA 141 12/06/2016 1713   K 4.9 12/06/2016 1713   CL 105 12/06/2016 1713   CO2 27 12/06/2016 1713   GLUCOSE 143 (H) 12/06/2016 1713   BUN 16 12/06/2016 1713   CREATININE 0.86 12/06/2016 1713   CALCIUM 9.6 12/06/2016 1713   GFRNONAA >60 10/09/2016 2008   GFRAA >60 10/09/2016 2008    BNP  Component Value Date/Time   BNP 110.2 (H) 08/17/2016 2217    ProBNP    Component Value Date/Time   PROBNP 42.0 12/06/2016 1713    Imaging: Dg Chest 2 View  Result Date: 12/07/2016 CLINICAL DATA:  Short of breath for 5 weeks, history of pulmonary fibrosis, former smoking history EXAM: CHEST  2 VIEW COMPARISON:  Chest x-ray of 09/09/2016 FINDINGS: Aeration of the lungs has improved. There are still somewhat prominent interstitial markings diffusely which may reflect pulmonary fibrosis. No pneumonia or effusion is seen. Mediastinal and hilar contours are unremarkable. The heart is within normal limits in size. No bony abnormality is seen. IMPRESSION: Improved aeration. Interstitial prominence diffusely is consistent with pulmonary fibrosis. Electronically Signed   By: Dwyane Dee M.D.   On: 12/07/2016 08:11     Assessment & Plan:   No problem-specific Assessment & Plan notes found for this encounter.     Rubye Oaks, NP 12/19/2016

## 2016-12-21 NOTE — Progress Notes (Signed)
Chart and office note reviewed in detail s > agree with a/p as outlined   

## 2016-12-27 ENCOUNTER — Encounter: Attending: Physician Assistant | Primary: Internal Medicine

## 2017-01-01 ENCOUNTER — Ambulatory Visit: Attending: Physician Assistant | Primary: Internal Medicine

## 2017-01-01 DIAGNOSIS — F330 Major depressive disorder, recurrent, mild: Secondary | ICD-10-CM

## 2017-01-01 DIAGNOSIS — Z6825 Body mass index (BMI) 25.0-25.9, adult: Secondary | ICD-10-CM

## 2017-01-01 DIAGNOSIS — F411 Generalized anxiety disorder: Principal | ICD-10-CM

## 2017-01-01 MED ORDER — ALPRAZOLAM 0.5 MG PO TABS
.5 mg | Freq: Three times a day (TID) | ORAL | 2 refills | Status: CP | PRN
Start: 2017-01-01 — End: 2017-08-13

## 2017-01-01 MED ORDER — VORTIOXETINE HBR 20 MG PO TABS
20 mg | Freq: Every day | ORAL | 1 refills | Status: CP
Start: 2017-01-01 — End: 2018-06-05

## 2017-01-01 MED ORDER — ALPRAZOLAM 0.5 MG PO TABS
.5 mg | Freq: Every day | ORAL | 2 refills | Status: CP | PRN
Start: 2017-01-01 — End: 2017-01-01

## 2017-01-01 NOTE — Progress Notes
?   Preventive Wellness Visit  05/13/2016   ? Influenza Vaccine (1) 06/30/2016   ? DTaP,Tdap,and Td Vaccines (2 - Td) 12/07/2019   ? Lipid Profile  08/19/2020   ? Zoster Vaccine (1) 01/12/2021   ? Colon Cancer Screening  04/19/2023   ? USPSTF HIV Risk Assessment  Addressed   ? USPSTF Hepatitis C Screening  Addressed             Discussed plan of care with patient. Patient is also advised to various treatment options and agreed to following: taking medications as directed, keeping follow up visits.  Risks verses benefits of medication and treatment were discussed.  Patient instructed to maintain exercise routine and adopt a healthy diet.  Patient voiced agreement and understanding.

## 2017-01-01 NOTE — Progress Notes
Gastrointestinal: Negative for abdominal distention, abdominal pain, constipation, diarrhea, nausea and vomiting.   Endocrine: Negative for polydipsia, polyphagia and polyuria.   Psychiatric/Behavioral: Positive for decreased concentration and dysphoric mood. Negative for agitation, behavioral problems, confusion, hallucinations, self-injury, sleep disturbance and suicidal ideas. The patient is nervous/anxious. The patient is not hyperactive.          Objective:       Physical Exam   Constitutional: He is oriented to person, place, and time. He appears well-developed and well-nourished.   Neurological: He is alert and oriented to person, place, and time.   Psychiatric: He has a normal mood and affect. His behavior is normal. Judgment and thought content normal.   Nursing note and vitals reviewed.        Assessment:       ICD-10-CM ICD-9-CM    1. Anxiety state F41.1 300.00 ALPRAZolam (XANAX) 0.5 MG PO Tablet      DISCONTINUED: ALPRAZolam (XANAX) 0.5 MG PO Tablet   2. 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 DISCONTD: ALPRAZolam (XANAX) 0.5 MG PO Tablet     Sig: Take 1 tablet by mouth daily as needed for sleep or anxiety.     Dispense:  90 tablet     Refill:  2   ? vortioxetine (TRINTELLIX) 20 MG PO Tablet     Sig: Take 1 tablet by mouth daily.     Dispense:  90 tablet     Refill:  1   ? ALPRAZolam (XANAX) 0.5 MG PO Tablet     Sig: Take 1 tablet by mouth 3 times daily as needed for sleep or anxiety.     Dispense:  90 tablet     Refill:  2     No orders of the following type(s) were placed in this encounter: Procedures      Health Maintenance was reviewed. The patient's HM Topic list was:                                            Health Maintenance   Topic Date Due

## 2017-01-01 NOTE — Progress Notes
No teaching statement needed

## 2017-01-01 NOTE — Progress Notes
Subjective:   Gary Landry is a 56 y.o. male being seen today for Medications Refill and Anxiety      Gary Landry is doing very well right now with depression and anxiety with trintellix and prn xanax that he uses for anxiety and occassionally for sleep  Doesn't use ambien much at all and doesn't use it with xanax       Past Medical History:   Diagnosis Date   ? Anxiety    ? Depression      Past Surgical History:   Procedure Laterality Date   ? KNEE SURGERY      Surgery Description: Knee Surgery;   (Created by Conversion)     Family History   Problem Relation Age of Onset   ? High Blood Pressure Other      Social History     Social History   ? Marital status: Married     Spouse name: N/A   ? Number of children: N/A   ? Years of education: N/A     Occupational History   ? Not on file.     Social History Main Topics   ? Smoking status: Unknown If Ever Smoked   ? Smokeless tobacco: Not on file   ? Alcohol use Yes   ? Drug use: No   ? Sexual activity: Not on file     Other Topics Concern   ? Not on file     Social History Narrative     Current Outpatient Prescriptions on File Prior to Visit   Medication Sig   ? [DISCONTINUED] ALPRAZolam (XANAX) 0.5 MG Tablet Take 1 tablet by mouth daily as needed for sleep or anxiety.   ? [DISCONTINUED] vortioxetine (TRINTELLIX) 20 MG PO Tablet Take 1 tablet by mouth daily.   ? zolpidem (AMBIEN) 10 MG Tablet Take 1 tablet by mouth nightly at bedtime as needed for sleep.     No current facility-administered medications on file prior to visit.      Allergies   Allergen Reactions   ? Penicillins      Penicillins   ? Sulfa Drugs      Sulfa Drugs         Review of Systems  Review of Systems   Constitutional: Negative for activity change, appetite change, chills, fatigue, fever and unexpected weight change.   Respiratory: Negative for apnea, cough, chest tightness, shortness of breath and wheezing.    Cardiovascular: Negative for chest pain, palpitations and leg swelling.

## 2017-01-16 ENCOUNTER — Ambulatory Visit: Payer: BLUE CROSS/BLUE SHIELD | Admitting: Internal Medicine

## 2017-01-18 ENCOUNTER — Encounter
Payer: BLUE CROSS/BLUE SHIELD | Attending: Physical Medicine & Rehabilitation | Admitting: Physical Medicine & Rehabilitation

## 2017-01-18 DIAGNOSIS — F411 Generalized anxiety disorder: Secondary | ICD-10-CM | POA: Insufficient documentation

## 2017-01-18 DIAGNOSIS — G931 Anoxic brain damage, not elsewhere classified: Secondary | ICD-10-CM | POA: Insufficient documentation

## 2017-02-08 ENCOUNTER — Ambulatory Visit: Attending: Internal Medicine | Primary: Internal Medicine

## 2017-02-08 DIAGNOSIS — Z6825 Body mass index (BMI) 25.0-25.9, adult: Principal | ICD-10-CM

## 2017-02-08 DIAGNOSIS — F329 Major depressive disorder, single episode, unspecified: Secondary | ICD-10-CM

## 2017-02-08 DIAGNOSIS — F419 Anxiety disorder, unspecified: Principal | ICD-10-CM

## 2017-02-08 DIAGNOSIS — G5621 Lesion of ulnar nerve, right upper limb: Secondary | ICD-10-CM

## 2017-02-08 NOTE — Progress Notes
Subjective:   Gary Landry is a 56 y.o. male being seen today for Elbow Pain       HPI  Installed new garage door   Lifted , spring undeer force  Now with right elbow , numbness 5th and 4th fingers      Past Medical History:   Diagnosis Date   ? Anxiety    ? Depression      Past Surgical History:   Procedure Laterality Date   ? KNEE SURGERY      Surgery Description: Knee Surgery;   (Created by Conversion)     Family History   Problem Relation Age of Onset   ? High Blood Pressure Other      Social History     Social History   ? Marital status: Married     Spouse name: N/A   ? Number of children: N/A   ? Years of education: N/A     Occupational History   ? Not on file.     Social History Main Topics   ? Smoking status: Unknown If Ever Smoked   ? Smokeless tobacco: Never Used   ? Alcohol use Yes   ? Drug use: No   ? Sexual activity: Not on file     Other Topics Concern   ? Not on file     Social History Narrative     Current Outpatient Prescriptions on File Prior to Visit   Medication Sig   ? ALPRAZolam (XANAX) 0.5 MG PO Tablet Take 1 tablet by mouth 3 times daily as needed for sleep or anxiety.   ? vortioxetine (TRINTELLIX) 20 MG PO Tablet Take 1 tablet by mouth daily.   ? zolpidem (AMBIEN) 10 MG Tablet Take 1 tablet by mouth nightly at bedtime as needed for sleep.     No current facility-administered medications on file prior to visit.      Allergies   Allergen Reactions   ? Penicillins      Penicillins   ? Sulfa Drugs      Sulfa Drugs         Review of Systems  Review of Systems   Respiratory: Negative.    Cardiovascular: Negative.    Neurological: Positive for numbness. Negative for weakness.           Objective:        VITAL SIGNS (all recorded)      Clinic Vitals       02/08/17 1547             Amb Encounter Vitals    Weight 83 kg (183 lb)    -CT at 02/08/17 1547       Height 1.803 m ( )    -CT at 02/08/17 1547       BMI (Calculated) 25.58    -CT at 02/08/17 1547       BSA (Calculated - sq m) 2.04

## 2017-02-08 NOTE — Progress Notes
-  CT at 02/08/17 1547       BP 120/68    -CT at 02/08/17 1547       Pulse 69    -CT at 02/08/17 1547       Resp 18    -CT at 02/08/17 1547       O2 Saturation 98 %    -CT at 02/08/17 1547       Education/Communication Barriers?    Learning/Communication Barriers? No    -CT at 02/08/17 1547       Fall Risk Assessment    Had recent fall / Last 6 months? No recent fall    -CT at 02/08/17 1547       Does patient have a fear of falling? No    -CT at 02/08/17 1547         User Key  (r) = Recorded By, (t) = Taken By, (c) = Cosigned By    Initials Name Effective Dates    CT Gary Landry, [place] 04/26/16 -         Physical Exam   Constitutional: He is oriented to person, place, and time.   Cardiovascular: Normal rate.    Pulmonary/Chest: Effort normal.   Abdominal: Soft. Bowel sounds are normal.   Musculoskeletal: He exhibits tenderness (right olecanon) and deformity. He exhibits no edema.   Neurological: He is alert and oriented to person, place, and time. He exhibits abnormal muscle tone.   Skin: Skin is warm and dry.   Nursing note reviewed.       Assessment:       ICD-10-CM ICD-9-CM    1. 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 BMI 25.0-25.9,adult Z68.25 V85.21      Secondary to trauma  Ref to JOI     Plan:     No orders of the following type(s) were placed in this encounter: Medications.     No orders of the following type(s) were placed in this encounter: Procedures      Health Maintenance was reviewed. The patient's HM Topic list was:                                            Health Maintenance   Topic Date Due   ? Preventive Wellness Visit  05/13/2016   ? Influenza Vaccine (1) 06/30/2016   ? DTaP,Tdap,and Td Vaccines (2 - Td) 12/07/2019   ? Lipid Profile  08/19/2020   ? Zoster Vaccine (1) 01/12/2021   ? Colon Cancer Screening  04/19/2023   ? USPSTF HIV Risk Assessment  Addressed   ? USPSTF Hepatitis C Screening  Addressed

## 2017-03-21 ENCOUNTER — Other Ambulatory Visit: Payer: Self-pay | Admitting: Internal Medicine

## 2017-05-02 IMAGING — MR MR CERVICAL SPINE W/O CM
4 of 6 series · 19 of 48 positions shown · non-contrast
Comparison: None.

CLINICAL DATA: Weakness.

EXAM:
MRI CERVICAL SPINE WITHOUT CONTRAST
TECHNIQUE: Multiplanar, multisequence MR imaging of the cervical spine was
performed. No intravenous contrast was administered.

[Series 2: T2 · sagittal · 3.0mm · 0.43mm/px · 6 of 12 slices shown (1 of 2)]
[im 1/12]
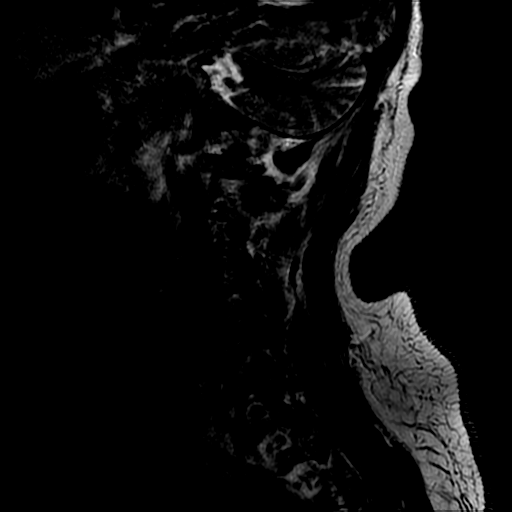
[im 3/12]
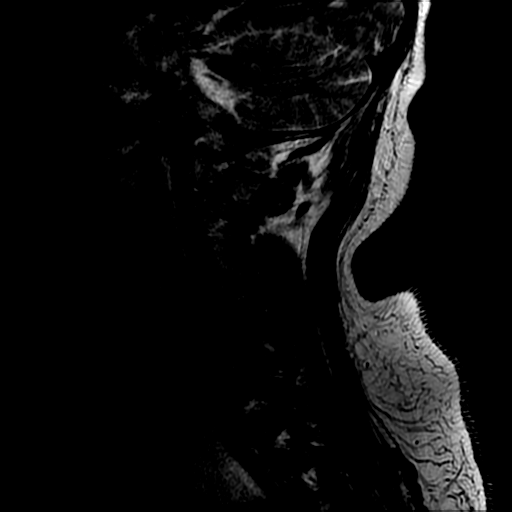
[im 5/12]
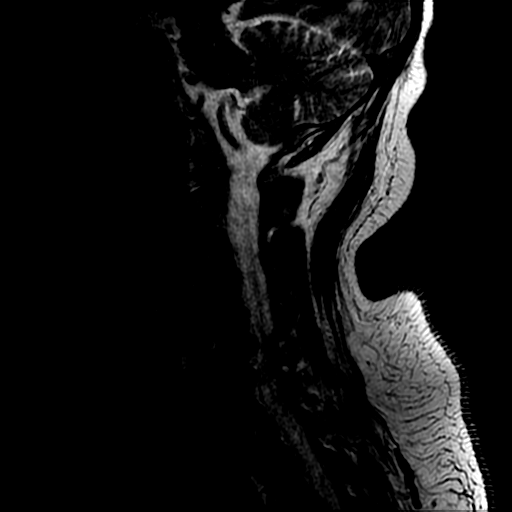
[im 7/12]
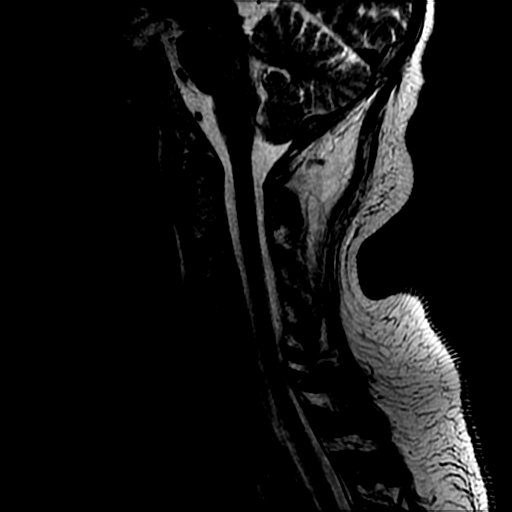
[im 9/12]
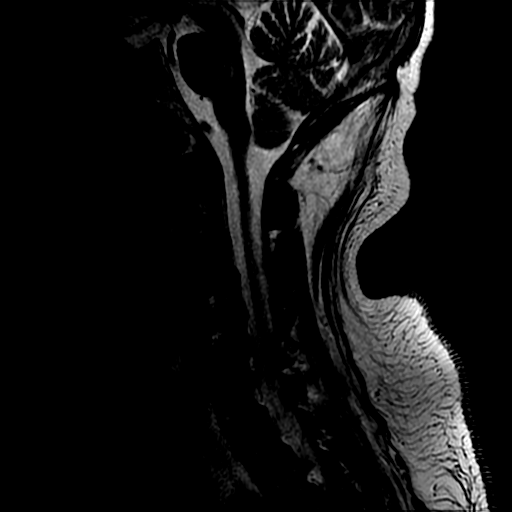
[im 12/12]
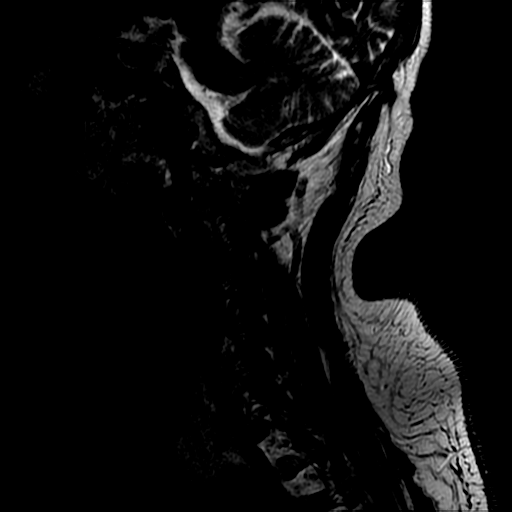

[Series 3: T1 · sagittal · 3.0mm · 0.43mm/px · 3 of 12 slices shown]
[im 3/12]
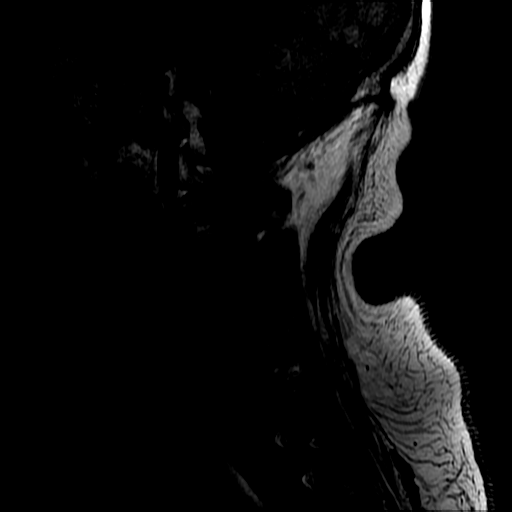
[im 7/12]
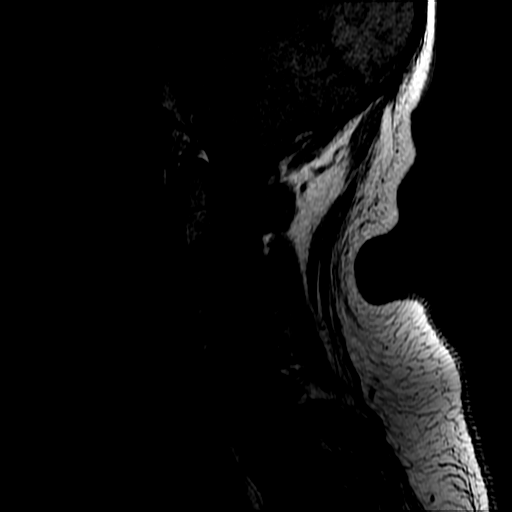
[im 12/12]
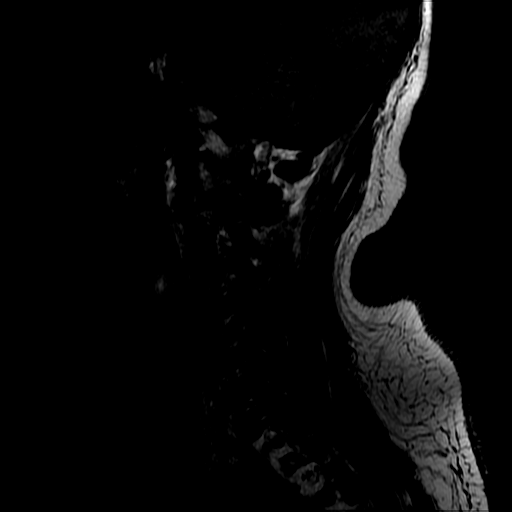

[Series 5: ax 2d merge · axial · 3.0mm · 0.39mm/px · z∈[-13,+51]mm · 3 of 29 slices shown]
[im 5/29]
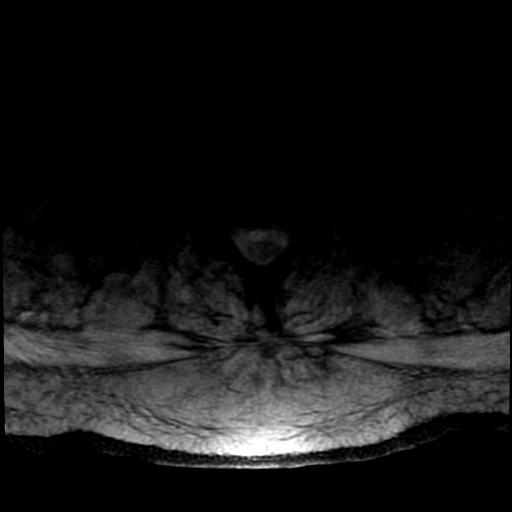
[im 15/29]
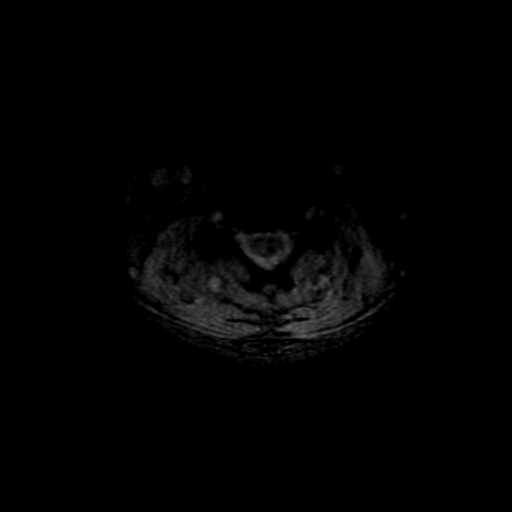
[im 24/29]
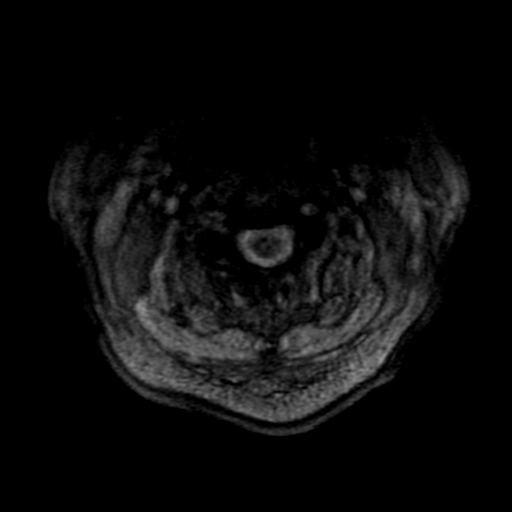

[Series 6: T2 · axial · 3.0mm · 0.39mm/px · z∈[-27,+51]mm · 7 of 29 slices shown (2 of 2)]
[im 1/29]
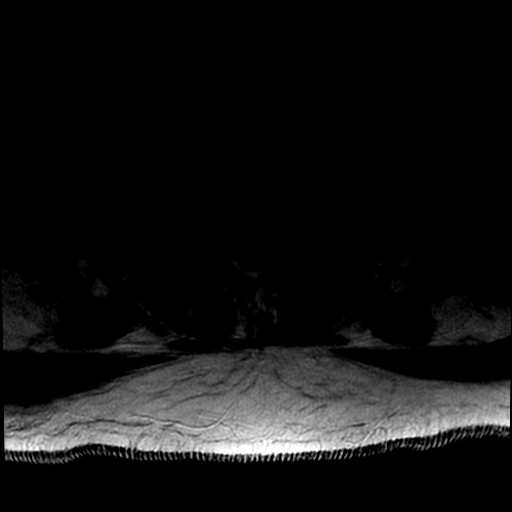
[im 5/29]
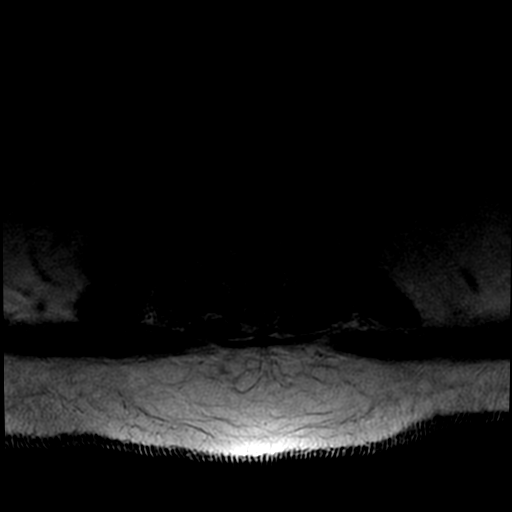
[im 10/29]
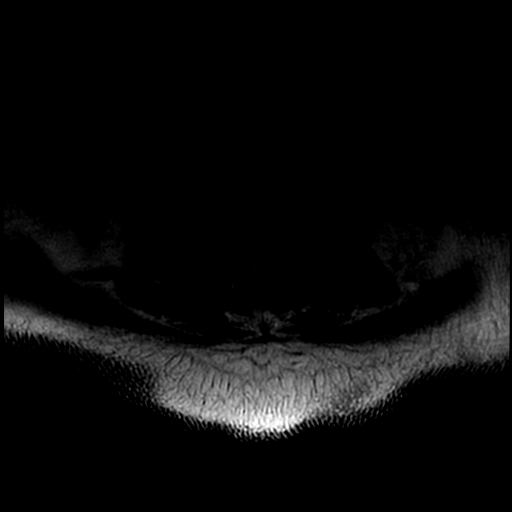
[im 12/29]
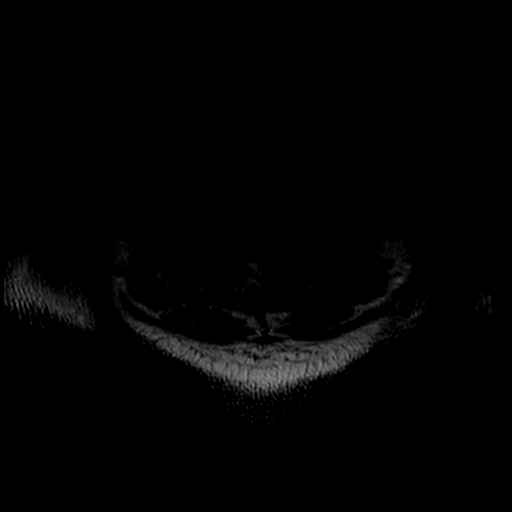
[im 15/29]
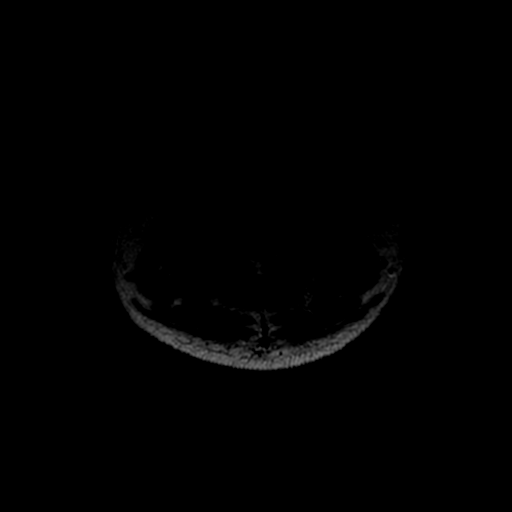
[im 17/29]
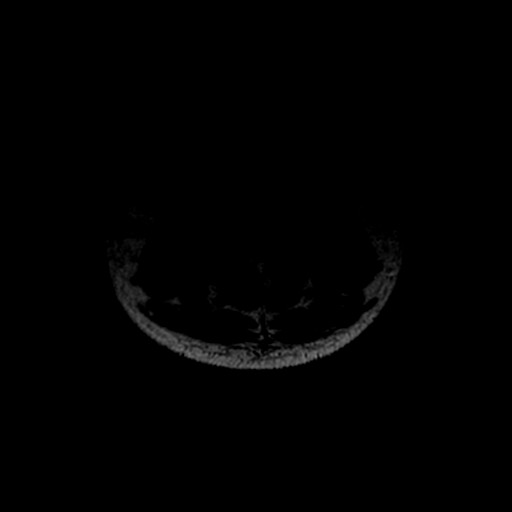
[im 24/29]
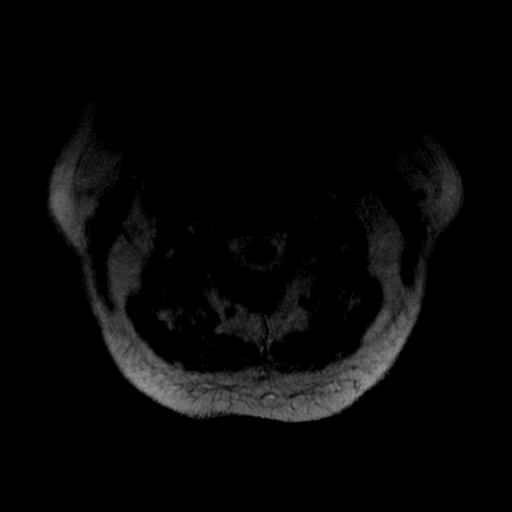

[19 of 48 positions shown; findings below may reference images not displayed]

FINDINGS: Image quality degraded by moderate motion. The patient was moving on
all sequences.

Alignment: Normal alignment. Straightening of the cervical lordosis.

Vertebrae: Negative for fracture or mass.

Cord: Limited evaluation of the cord due to motion. No cord lesion
identified.

Posterior Fossa, vertebral arteries, paraspinal tissues: Negative

Disc levels:

C2-3:  Negative

C3-4:  Negative

C4-5:  Negative

C5-6: Disc degeneration and spondylosis. Diffuse uncinate spurring
with moderate left foraminal narrowing and mild right foraminal
narrowing. No cord deformity

C6-7: Disc degeneration and spondylosis. Mild spinal and foraminal
stenosis bilaterally

C7-T1: Negative
IMPRESSION: Image quality degraded by significant motion

Cervical degenerative changes appear chronic and are most prominent
at C5-6. There is moderate left foraminal narrowing at C5-6 due to
spurring and mild right foraminal narrowing. Mild spondylosis also
at C6-7.

## 2017-05-02 IMAGING — CR DG CHEST 1V PORT
1 series · 1 of 1 positions shown · non-contrast
Comparison: 09/04/2016

CLINICAL DATA: ARDS

EXAM:
PORTABLE CHEST 1 VIEW

[AP]
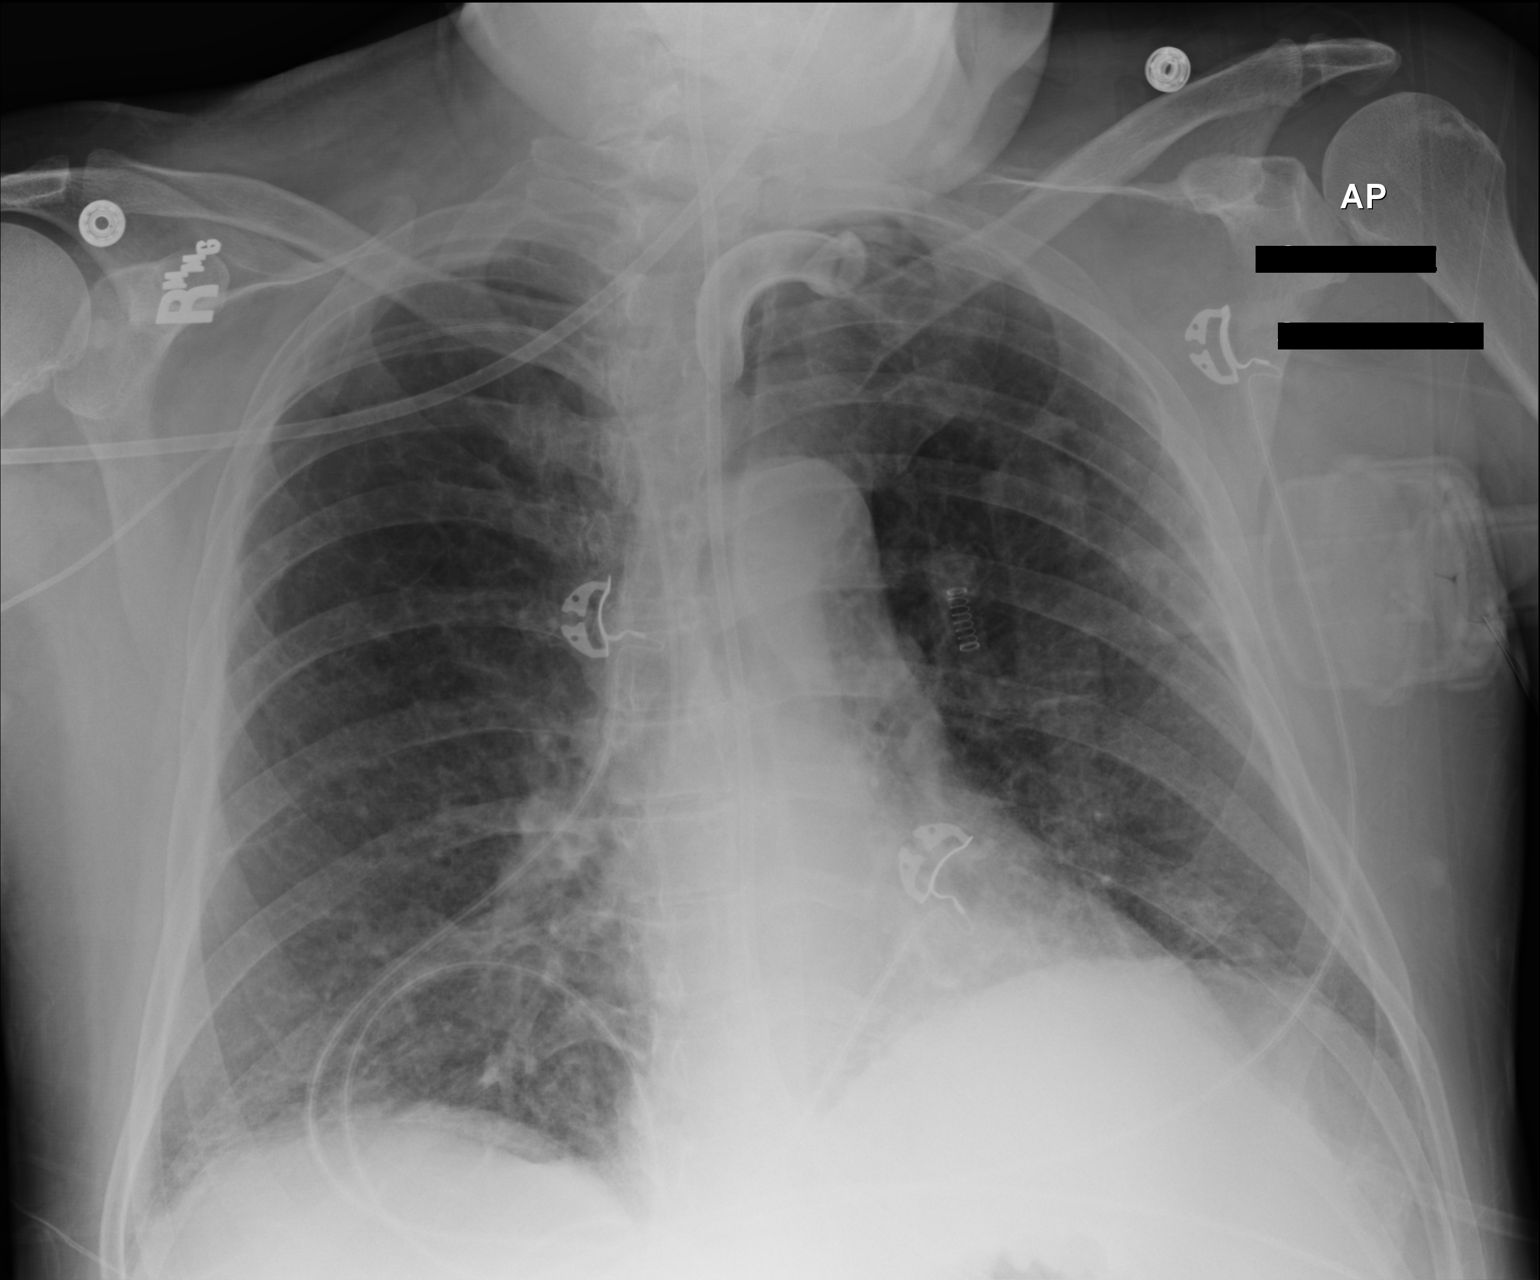

[1 of 1 positions shown; findings below may reference images not displayed]

FINDINGS: Tracheostomy tube, feeding catheter and right-sided PICC line are
again identified and stable. The overall inspiratory effort has
improved with clearing of bilateral infiltrates. Some bibasilar
atelectasis remains. No new focal abnormality is seen.
IMPRESSION: Significant improved aeration with only mild bibasilar atelectasis.

## 2017-08-13 ENCOUNTER — Ambulatory Visit: Attending: Physician Assistant | Primary: Internal Medicine

## 2017-08-13 DIAGNOSIS — G4709 Other insomnia: Secondary | ICD-10-CM

## 2017-08-13 DIAGNOSIS — F411 Generalized anxiety disorder: Principal | ICD-10-CM

## 2017-08-13 DIAGNOSIS — Z6823 Body mass index (BMI) 23.0-23.9, adult: Secondary | ICD-10-CM

## 2017-08-13 DIAGNOSIS — Z23 Encounter for immunization: Secondary | ICD-10-CM

## 2017-08-13 MED ORDER — ALPRAZOLAM 0.5 MG PO TABS
.5 mg | Freq: Three times a day (TID) | ORAL | 2 refills | Status: CP | PRN
Start: 2017-08-13 — End: 2018-03-01

## 2017-08-13 MED ORDER — ZOLPIDEM TARTRATE 10 MG PO TABS
10 mg | Freq: Every evening | ORAL | 2 refills | Status: CP | PRN
Start: 2017-08-13 — End: 2018-06-05

## 2017-08-13 NOTE — Progress Notes
Constitutional: He is oriented to person, place, and time. He appears well-developed and well-nourished.   HENT:   Head: Normocephalic and atraumatic.   Cardiovascular: Normal rate, regular rhythm and normal heart sounds.    Pulmonary/Chest: Effort normal and breath sounds normal.   Neurological: He is alert and oriented to person, place, and time.   Psychiatric: He has a normal mood and affect. His behavior is normal. Judgment and thought content normal.   Nursing note and vitals reviewed.        Assessment:       ICD-10-CM ICD-9-CM    1. Anxiety state F41.1 300.00 ALPRAZolam (XANAX) 0.5 MG PO Tablet      Vitamin B12      TSH      Comprehensive Metabolic Panel      Lipid Panel      PSA (Screening)      Folate      CBC and Differential      Hemoglobin A1c w/EAG      25-HYDROXY VITAMIN D      Urinalysis W Microscopy      Vitamin B12      TSH      Comprehensive Metabolic Panel      Lipid Panel      PSA (Screening)      Folate      CBC and Differential      Hemoglobin A1c w/EAG      25-HYDROXY VITAMIN D      Urinalysis W Microscopy   2. Other insomnia G47.09 780.52 zolpidem (AMBIEN) 10 MG PO Tablet      Vitamin B12      TSH      Comprehensive Metabolic Panel      Lipid Panel      PSA (Screening)      Folate      CBC and Differential      Hemoglobin A1c w/EAG      25-HYDROXY VITAMIN D      Urinalysis W Microscopy      Vitamin B12      TSH      Comprehensive Metabolic Panel      Lipid Panel      PSA (Screening)      Folate      CBC and Differential      Hemoglobin A1c w/EAG      25-HYDROXY VITAMIN D      Urinalysis W Microscopy   3. Body mass index (BMI) of 23.0-23.9 in adult Z68.23 V85.1    4. Need for vaccination Z23 V05.9 Flu vaccine (3 yo and older), FLUZONE 0.5 mL, QUADRIVALENT, Preserv-Free, IM          Plan:     See hpi  EFORCSE checked today    Orders Placed This Encounter   Medications   ? ALPRAZolam (XANAX) 0.5 MG PO Tablet     Sig: Take 1 tablet by mouth 3 times daily as needed for sleep or anxiety.

## 2017-08-13 NOTE — Progress Notes
Subjective:   Gary Landry is a 56 y.o. male being seen today for Medications Refill       Gary Landry is in today for routine med refill  Doing well with current meds  Using ambien and xanax both very prn and never together  Trintellix is working well  Has appt for cpe in 10 days  Needs labs drawn and this was done today  Flu vax given today as well    Medications Refill   This is a chronic problem. The problem has been unchanged. Pertinent negatives include no abdominal pain, chest pain, chills, coughing, fatigue, fever, nausea or vomiting. The treatment provided moderate relief.          Past Medical History:   Diagnosis Date   ? Anxiety    ? Depression      Past Surgical History:   Procedure Laterality Date   ? KNEE SURGERY      Surgery Description: Knee Surgery;   (Created by Conversion)     Family History   Problem Relation Age of Onset   ? High Blood Pressure Other      Social History     Social History   ? Marital status: Married     Spouse name: N/A   ? Number of children: N/A   ? Years of education: N/A     Occupational History   ? Not on file.     Social History Main Topics   ? Smoking status: Unknown If Ever Smoked   ? Smokeless tobacco: Never Used   ? Alcohol use Yes   ? Drug use: No   ? Sexual activity: Not on file     Other Topics Concern   ? Not on file     Social History Narrative   ? No narrative on file     Current Outpatient Prescriptions on File Prior to Visit   Medication Sig   ? [DISCONTINUED] ALPRAZolam (XANAX) 0.5 MG PO Tablet Take 1 tablet by mouth 3 times daily as needed for sleep or anxiety.   ? vortioxetine (TRINTELLIX) 20 MG PO Tablet Take 1 tablet by mouth daily.   ? [DISCONTINUED] zolpidem (AMBIEN) 10 MG Tablet Take 1 tablet by mouth nightly at bedtime as needed for sleep.     No current facility-administered medications on file prior to visit.      Allergies   Allergen Reactions   ? Penicillins      Penicillins   ? Sulfa Drugs      Sulfa Drugs         Review of Systems

## 2017-08-13 NOTE — Progress Notes
Review of Systems   Constitutional: Negative for activity change, appetite change, chills, fatigue, fever and unexpected weight change.   Respiratory: Negative for apnea, cough, chest tightness, shortness of breath and wheezing.    Cardiovascular: Negative for chest pain, palpitations and leg swelling.   Gastrointestinal: Negative for abdominal distention, abdominal pain, constipation, diarrhea, nausea and vomiting.   Endocrine: Negative for polydipsia, polyphagia and polyuria.   Psychiatric/Behavioral: Positive for sleep disturbance. Negative for agitation, behavioral problems, confusion, decreased concentration, dysphoric mood, hallucinations, self-injury and suicidal ideas. The patient is nervous/anxious. The patient is not hyperactive.          Objective:        VITAL SIGNS (all recorded)      Clinic Vitals     Row Name 08/13/17 1313                Amb Encounter Vitals    Weight 84.4 kg (186 lb)    -TW at 08/13/17 1315       Height 1.803 m ( )    -TW at 08/13/17 1320       BMI (Calculated) 26    -TW at 08/13/17 1320       BSA (Calculated - sq m) 2.06    -TW at 08/13/17 1320       BP 138/76    -TW at 08/13/17 1315       BP Location Left upper arm    -TW at 08/13/17 1315       Position Sitting    -TW at 08/13/17 1315       Pulse 76    -TW at 08/13/17 1315       Pulse Source Radial    -TW at 08/13/17 1315       Pulse Quality Normal    -TW at 08/13/17 1315       Resp 15    -TW at 08/13/17 1315       Respiration Quality Normal    -TW at 08/13/17 1315          Education/Communication Barriers?    Learning/Communication Barriers? No    -TW at 08/13/17 1315          Fall Risk Assessment    Had recent fall / Last 6 months? No recent fall    -TW at 08/13/17 1315       Does patient have a fear of falling? No    -TW at 08/13/17 1315         User Key  (r) = Recorded By, (t) = Taken By, (c) = Cosigned By    Initials Name Effective Dates    TW Lorelle Gibbs, [place] 02/01/16 -         Physical Exam

## 2017-08-13 NOTE — Progress Notes
Dispense:  90 tablet     Refill:  2   ? zolpidem (AMBIEN) 10 MG PO Tablet     Sig: Take 1 tablet by mouth nightly at bedtime as needed for sleep.     Dispense:  30 tablet     Refill:  2     Orders Placed This Encounter   Procedures   ? Flu vaccine (56 yo and older), FLUZONE 0.5 mL, QUADRIVALENT, Preserv-Free, IM   ? Vitamin B12   ? TSH   ? Comprehensive Metabolic Panel   ? Lipid Panel   ? PSA (Screening)   ? Folate   ? Hemoglobin A1c w/EAG   ? 25-HYDROXY VITAMIN D   ? Urinalysis W Microscopy       Health Maintenance was reviewed. The patient's HM Topic list was:                                            Health Maintenance   Topic Date Due   ? Zoster Vaccine (1 of 2) 01/13/2011   ? Preventive Wellness Visit  05/13/2016   ? DTaP,Tdap,and Td Vaccines (2 - Td) 12/07/2019   ? Lipid Profile  08/19/2020   ? Colon Cancer Screening  04/19/2023   ? USPSTF HIV Risk Assessment  Addressed   ? USPSTF Hepatitis C Screening  Addressed   ? Influenza Vaccine  Addressed             Discussed plan of care with patient. Patient is also advised to various treatment options and agreed to following: taking medications as directed, keeping follow up visits.  Risks verses benefits of medication and treatment were discussed.  Patient instructed to maintain exercise routine and adopt a healthy diet.  Patient voiced agreement and understanding.       Durant's Estimated body mass index is 25.94 kg/m? as calculated from the following:    Height as of this encounter: 1.803 m ( ).    Weight as of this encounter: 84.4 kg (186 lb).      The health risks associated with an elevated BMI were discussed and education was provided.  See orders for any further follow up plans.

## 2017-08-13 NOTE — Progress Notes
Eforcse within guidelines

## 2017-08-23 ENCOUNTER — Ambulatory Visit: Attending: Internal Medicine | Primary: Internal Medicine

## 2017-08-23 DIAGNOSIS — E559 Vitamin D deficiency, unspecified: Secondary | ICD-10-CM

## 2017-08-23 DIAGNOSIS — Z6825 Body mass index (BMI) 25.0-25.9, adult: Principal | ICD-10-CM

## 2017-08-23 DIAGNOSIS — F419 Anxiety disorder, unspecified: Principal | ICD-10-CM

## 2017-08-23 DIAGNOSIS — N289 Disorder of kidney and ureter, unspecified: Secondary | ICD-10-CM

## 2017-08-23 DIAGNOSIS — Z1211 Encounter for screening for malignant neoplasm of colon: Secondary | ICD-10-CM

## 2017-08-23 DIAGNOSIS — F329 Major depressive disorder, single episode, unspecified: Secondary | ICD-10-CM

## 2017-08-23 DIAGNOSIS — Z0000 Encounter for general adult medical examination without abnormal findings: Secondary | ICD-10-CM

## 2017-08-23 DIAGNOSIS — E538 Deficiency of other specified B group vitamins: Secondary | ICD-10-CM

## 2017-08-23 DIAGNOSIS — K625 Hemorrhage of anus and rectum: Secondary | ICD-10-CM

## 2017-08-23 MED ORDER — CYANOCOBALAMIN 1000 MCG PO TABS
1000 ug | Freq: Every day | ORAL | 3 refills | Status: CP
Start: 2017-08-23 — End: ?

## 2017-08-23 MED ORDER — ERGOCALCIFEROL 1.25 MG (50000 UT) PO CAPS
50000 [IU] | ORAL | 2 refills | Status: CP
Start: 2017-08-23 — End: 2017-11-12

## 2017-08-23 NOTE — Progress Notes
Discussed plan of care with patient. Patient is also advised to various treatment options and agreed to following: taking medications as directed, keeping follow up visits.  Risks verses benefits of medication and treatment were discussed.  Patient instructed to maintain exercise routine and adopt a healthy diet.  Patient voiced agreement and understanding.       Pj's Estimated body mass index is 25.74 kg/m? as calculated from the following:    Height as of this encounter: 1.82 m (5' 11.65").    Weight as of this encounter: 85.3 kg (188 lb).      The health risks associated with an elevated BMI were discussed and education was provided.  See orders for any further follow up plans.

## 2017-08-23 NOTE — Progress Notes
Psychiatric: He has a normal mood and affect. His behavior is normal.   Nursing note and vitals reviewed.        Assessment:       ICD-10-CM ICD-9-CM    1. Annual physical exam Z00.00 V70.0    2. BMI 25.0-25.9,adult Z68.25 V85.21    3. B12 deficiency E53.8 266.2 cyanocobalamin (VITAMIN B-12) 1000 MCG PO Tablet      Vitamin B12   4. Vitamin D deficiency E55.9 268.9 ergocalciferol (VITAMIN D-2) 50000 units PO Capsule      25-HYDROXY VITAMIN D      25-HYDROXY VITAMIN D   5. Hypercalcemia E83.52 275.42 Calcium      Phosphorus      PTH Intact      Calcium      Phosphorus      PTH Intact   6. Renal insufficiency N28.9 593.9 Basic Metabolic Panel   7. Rectal bleeding K62.5 569.3 Refer to Gastroenterology     CREAT 1.35 SECONDARY TO ADVIL  DC ALL NSAID    VIT B12 LOW RANGE OF NORMAL    VITAMIN D LOW  REC VIT D     F/U LABS IN 3 MONTHS    PTH  CA++ AND PHOS NOW    REF TO DR Lytle MichaelsSIEVERT DUE TO RECTAL BLEEDING         Plan:     Orders Placed This Encounter   Medications   ? cyanocobalamin (VITAMIN B-12) 1000 MCG PO Tablet     Sig: Take 1 tablet by mouth daily.     Dispense:  30 tablet     Refill:  3   ? ergocalciferol (VITAMIN D-2) 50000 units PO Capsule     Sig: Take 1 capsule by mouth once a week.     Dispense:  4 capsule     Refill:  2     Orders Placed This Encounter   Procedures   ? Calcium   ? Phosphorus   ? PTH Intact   ? Vitamin B12   ? 25-HYDROXY VITAMIN D   ? Basic Metabolic Panel   ? Refer to Gastroenterology       Health Maintenance was reviewed. The patient's HM Topic list was:                                            Health Maintenance   Topic Date Due   ? Preventive Wellness Visit  08/23/2018   ? DTaP,Tdap,and Td Vaccines (2 - Td) 12/07/2019   ? Lipid Profile  08/13/2022   ? Colon Cancer Screening  04/19/2023   ? USPSTF HIV Risk Assessment  Addressed   ? USPSTF Hepatitis C Screening  Addressed   ? Influenza Vaccine  Addressed   ? Zoster Vaccine  Excluded

## 2017-08-23 NOTE — Progress Notes
?   Color -Ur 08/13/2017 YELLOW    ? APPEARANCE 08/13/2017 CLEAR    ? Specific Gravity -Ur 08/13/2017 1.011    ? pH -Ur 08/13/2017 6.5    ? Glucose -Ur 08/13/2017 NEGATIVE    ? Bilirubin -Ur 08/13/2017 NEGATIVE    ? Ketones UA 08/13/2017 NEGATIVE    ? Hb -Ur 08/13/2017 NEGATIVE    ? Protein-UA 08/13/2017 NEGATIVE    ? NITRITE 08/13/2017 NEGATIVE    ? Leukocyte Esterase -Ur 08/13/2017 NEGATIVE    ? WBC -Ur 08/13/2017 NONE SEEN    ? RBC -Ur 08/13/2017 NONE SEEN    ? Squam Epithel, UA 08/13/2017 NONE SEEN    ? BACTERIA 08/13/2017 NONE SEEN    ? Hyaline Casts, UA 08/13/2017 NONE SEEN             Past Medical History:   Diagnosis Date   ? Anxiety    ? Depression      Past Surgical History:   Procedure Laterality Date   ? KNEE SURGERY      Surgery Description: Knee Surgery;   (Created by Conversion)     Family History   Problem Relation Age of Onset   ? High Blood Pressure Other      Social History     Social History   ? Marital status: Married     Spouse name: N/A   ? Number of children: N/A   ? Years of education: N/A     Occupational History   ? Not on file.     Social History Main Topics   ? Smoking status: Unknown If Ever Smoked   ? Smokeless tobacco: Never Used   ? Alcohol use Yes   ? Drug use: No   ? Sexual activity: Not on file     Other Topics Concern   ? Not on file     Social History Narrative   ? No narrative on file     Current Outpatient Prescriptions on File Prior to Visit   Medication Sig   ? ALPRAZolam (XANAX) 0.5 MG PO Tablet Take 1 tablet by mouth 3 times daily as needed for sleep or anxiety.   ? vortioxetine (TRINTELLIX) 20 MG PO Tablet Take 1 tablet by mouth daily.   ? zolpidem (AMBIEN) 10 MG PO Tablet Take 1 tablet by mouth nightly at bedtime as needed for sleep.     No current facility-administered medications on file prior to visit.      Allergies   Allergen Reactions   ? Penicillins      Penicillins   ? Sulfa Drugs      Sulfa Drugs         Review of Systems  Review of Systems   HENT: Negative.

## 2017-08-23 NOTE — Progress Notes
Subjective:   Gary Landry is a 56 y.o. male being seen today for [place]    [place] INJURY TO [place]    Office Visit on 08/13/2017   Component Date Value   ? Vitamin B-12 08/13/2017 251    ? TSH, 3RD GENERATION 08/13/2017 1.07    ? Glucose 08/13/2017 97    ? Urea Nitrogen 08/13/2017 21    ? Creatinine 08/13/2017 1.35*   ? EGFR 08/13/2017 58*   ? Glom Filt Rate, Est Afri* 08/13/2017 68    ? BUN/Creatinine Ratio 08/13/2017 16    ? Sodium 08/13/2017 138    ? Potassium 08/13/2017 4.3    ? Chloride 08/13/2017 105    ? CARBON DIOXIDE 08/13/2017 25    ? Calcium 08/13/2017 11.0*   ? Protein, Total 08/13/2017 6.7    ? ALBUMIN 08/13/2017 4.5    ? Globulin 08/13/2017 2.2    ? ALBUMIN/GLOBULIN RATIO 08/13/2017 2.0    ? Total Bilirubin 08/13/2017 0.7    ? Alkaline Phosphatase 08/13/2017 42    ? AST 08/13/2017 14    ? ALT 08/13/2017 14    ? Cholesterol, Total 08/13/2017 180    ? HDL 08/13/2017 58    ? Triglycerides 08/13/2017 103    ? LDL Cholesterol 08/13/2017 102*   ? LDl/HDL Ratio 08/13/2017 3.1    ? NON-HDL CHOLESTEROL 08/13/2017 122    ? PSA, TOTAL 08/13/2017 1.2    ? FOLATE, SERUM 08/13/2017 21.9    ? WHITE BLOOD CELL COUNT 08/13/2017 3.9    ? RBC 08/13/2017 4.57    ? Hemoglobin 08/13/2017 14.7    ? Hematocrit 08/13/2017 41.9    ? MCV 08/13/2017 91.7    ? [place] 08/13/2017 32.2    ? MCHC 08/13/2017 35.1    ? RDW 08/13/2017 13.2    ? Platelets 08/13/2017 230    ? MPV 08/13/2017 10.5    ? Neutrophils Absolute 08/13/2017 2753    ? Lymphocytes Absolute 08/13/2017 831*   ? Monocytes Absolute 08/13/2017 218    ? Eosinophils Absolute 08/13/2017 78    ? Basophils Absolute 08/13/2017 20    ? Neutrophils 08/13/2017 70.6    ? LYMPHOCYTES 08/13/2017 21.3    ? MONOCYTES 08/13/2017 5.6    ? EOSINOPHILS 08/13/2017 2.0    ? BASOPHILS 08/13/2017 0.5    ? Hemoglobin A1C 08/13/2017 5.2    ? EAG (MG/DL) 08/13/2017 103    ? EAG (MMOL/L) 08/13/2017 5.7    ? Vit D, 25-Hydroxy 08/13/2017 22*

## 2017-08-23 NOTE — Progress Notes
Respiratory: Negative.    Cardiovascular: Negative.    Gastrointestinal: Positive for anal bleeding. Negative for abdominal distention, abdominal pain, constipation, diarrhea, nausea, rectal pain and vomiting.   Genitourinary: Negative.    Musculoskeletal: Positive for arthralgias.         Objective:        VITAL SIGNS (all recorded)      Clinic Vitals     Row Name 08/23/17 1411                Amb Encounter Vitals    Weight 85.3 kg (188 lb)    -CP at 08/23/17 1412       Height 1.82 m (5' 11.65")    -CP at 08/23/17 1412       BMI (Calculated) 25.8    -CP at 08/23/17 1412       BSA (Calculated - sq m) 2.08    -CP at 08/23/17 1412       BP 142/86    -CP at 08/23/17 1412       Pulse 61    -CP at 08/23/17 1412       Resp 18    -CP at 08/23/17 1412       O2 Saturation 98 %    -CP at 08/23/17 1412          Education/Communication Barriers?    Learning/Communication Barriers? No    -CP at 08/23/17 1412          Fall Risk Assessment    Had recent fall / Last 6 months? No recent fall    -CP at 08/23/17 1412       Does patient have a fear of falling? No    -CP at 08/23/17 1412         User Key  (r) = Recorded By, (t) = Taken By, (c) = Cosigned By    Initials Name Effective Dates    CP Henrine Screwsadro, [place], [place] 06/04/17 -         Physical Exam   Constitutional: He is oriented to person, place, and time. He appears well-developed and well-nourished. No distress.   Eyes: Pupils are equal, round, and reactive to light. Conjunctivae and EOM are normal.   Neck: Normal range of motion. Neck supple.   Cardiovascular: Normal rate, regular rhythm and normal heart sounds.    Pulmonary/Chest: Effort normal and breath sounds normal. No respiratory distress.   Abdominal: Soft. Bowel sounds are normal.   Genitourinary: Prostate normal and penis normal. Rectal exam shows guaiac positive stool.   Neurological: He is alert and oriented to person, place, and time.   Skin: Skin is warm and dry.

## 2017-09-04 DIAGNOSIS — E213 Hyperparathyroidism, unspecified: Principal | ICD-10-CM

## 2017-09-05 ENCOUNTER — Inpatient Hospital Stay: Admit: 2017-09-05 | Discharge: 2017-09-06 | Primary: Internal Medicine

## 2017-09-05 DIAGNOSIS — M47812 Spondylosis without myelopathy or radiculopathy, cervical region: Secondary | ICD-10-CM

## 2017-09-05 DIAGNOSIS — E213 Hyperparathyroidism, unspecified: Principal | ICD-10-CM

## 2017-09-05 DIAGNOSIS — J3489 Other specified disorders of nose and nasal sinuses: Secondary | ICD-10-CM

## 2017-09-05 DIAGNOSIS — M7989 Other specified soft tissue disorders: Secondary | ICD-10-CM

## 2017-09-12 DIAGNOSIS — E213 Hyperparathyroidism, unspecified: Principal | ICD-10-CM

## 2017-09-18 DIAGNOSIS — E210 Primary hyperparathyroidism: Principal | ICD-10-CM

## 2017-10-02 ENCOUNTER — Inpatient Hospital Stay: Admit: 2017-10-02 | Discharge: 2017-10-03 | Primary: Internal Medicine

## 2017-10-02 DIAGNOSIS — M47812 Spondylosis without myelopathy or radiculopathy, cervical region: Secondary | ICD-10-CM

## 2017-10-02 DIAGNOSIS — M7989 Other specified soft tissue disorders: Secondary | ICD-10-CM

## 2017-10-02 DIAGNOSIS — E213 Hyperparathyroidism, unspecified: Principal | ICD-10-CM

## 2017-10-02 DIAGNOSIS — E210 Primary hyperparathyroidism: Secondary | ICD-10-CM

## 2017-10-02 MED ORDER — TECHNETIUM TC 99M SESTAMIBI (CARDIOLITE)
22.5-27.5 | Freq: Once | INTRAVENOUS | Status: CP
Start: 2017-10-02 — End: ?

## 2017-11-07 ENCOUNTER — Ambulatory Visit: Attending: Internal Medicine | Primary: Internal Medicine

## 2017-11-07 DIAGNOSIS — F329 Major depressive disorder, single episode, unspecified: Secondary | ICD-10-CM

## 2017-11-07 DIAGNOSIS — K625 Hemorrhage of anus and rectum: Principal | ICD-10-CM

## 2017-11-07 DIAGNOSIS — E213 Hyperparathyroidism, unspecified: Secondary | ICD-10-CM

## 2017-11-07 DIAGNOSIS — F419 Anxiety disorder, unspecified: Principal | ICD-10-CM

## 2017-11-08 NOTE — Progress Notes
drainage tubes, blood transfusions, disability, deformity, morbidity and mortality was explained. The patient indicates understanding and wishes to proceed.

## 2017-11-08 NOTE — Progress Notes
[facility]    Patient: Gary Landry  MRN: [id number]  DOB: September 13, 1961  PCP: Patient Care Team:  Leanne ChangHoffmann, Susan, MD as PCP - General (Family Medicine)  REFERRING: Leanne ChangHoffmann, Susan, MD    Chief Complaint   Patient presents with   ? Rectal Bleeding   ? New Patient   .    Subjective:       HPI:  Gary Landry is a 57 y.o. male who is technically new patient to clinic. Last seen in 2014 for anemia. EGD and colonosopy were normal.     Has had some intermittent blood per rectum - lasted day or 2 then resolved. In September/october had multiple episodes of blood per rectum that was not resolving. Less than a TBSP of blood. Periodically has some harder stool, but no specific pattern. Went to PCP, DRE with heme+ stool. Since DRE in 07/2017 has not had blood.    No unusual stool. No urgency.     Due to have parathyroid surgery for possible adenoma in 11/2017.    The patient's medical records and laboratory data was reviewed.    Allergies:    He is allergic to penicillins and sulfa drugs.    Active Problem List:  Patient Active Problem List   Diagnosis   ? 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 Anxiety state   ? Insomnia, unspecified   ? Psychosexual dysfunction with inhibited sexual excitement   ? Controlled substance agreement signed   ? Calculus of both kidneys   ? Renal insufficiency   ? Body mass index (BMI) of 24.0-24.9 in adult   ? URI (upper respiratory infection)   ? 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 Annual physical exam   ? Vitamin D deficiency   ? Hypercalcemia   ? B12 deficiency       Past Medical History:   Reviewed and updated in the patient's EMR  Past Medical History:   Diagnosis Date   ? Anxiety    ? Depression    ? Depression    ? Hyperparathyroidism (CMS-HCC code)        Surgical History:   Past Surgical History:   Procedure Laterality Date   ? KNEE SURGERY      Surgery Description: Knee Surgery;   (Created by Conversion)   ? PATELLAR TENDON REPAIR

## 2017-11-08 NOTE — Progress Notes
bedtime as needed for sleep. 30 tablet 2     No current facility-administered medications for this visit.          Objective:     PHYSICAL EXAM:  BP (!) 166/93  - Pulse 93  - Temp 36.5 ?C (97.7 ?F)  - Resp 16  - Wt 85.7 kg (189 lb)  - BMI 25.88 kg/m?   Body mass index is 25.88 kg/m?.    CONSTITUTION(General Appearance) - Healthy appearing, looks stated age.  RESPIRATORY - Lungs clear to auscultation, normal respiratory effort.  CARDIOVASCULAR -  Normal heart sounds, normal abdominal aorta. Peripheral pulses present.   ABDOMEN -  Soft, nontender.  No organomegaly.  Normal bowel sounds. Non-distended.  EXTREMITIES -   No edema. Normal color.  MUSCULOSKELETAL - Normal gait and proximal muscle strength in all four extremities.  NEUROLOGICAL - No obvious deficits. Normal gait.  PSYCHIATRIC - Normal mood and oriented to person place and time, normal thought pattern, no flight of ideas or pressured speech.  RECTAL: Deferred    Lab or Diagnostic Testing:   OSH records reviewed in MEDIA section in EMR and/or patient's old records    Brief review:  Endoscopy:  2010-01 Colon - cecal polypoid lesions      2014 Colon    2016-06 EGD      Labs:    Imaging:    Assessment & Plan:       ICD-10-CM ICD-9-CM   1. Rectal bleeding K62.5 569.3   - Screening colonoscopy not due until 2026  - Repeat scope now to see if missed lesion  - Possible just hemorrhoids  Plan:  - Colon - PlenVu  - patient will wait until after parathyroid surgery  - High fiber diet  - RTC prn      Above plan explained in detail to the patient who understands and agrees. All questions answered.    Colonoscopy:  The risks, benefits and alternatives were explained in detail to the patient. Risks including but not limited to bleeding, perforation, adverse reaction to medications, cardiopulmonary compromise and their attendant sequalae explained. Sequelae including but not limited to need for surgery, colostomy, prolonged hospital stay, placement of

## 2017-11-08 NOTE — Progress Notes
Social History:   He  has an unknown smoking status. He has never used smokeless tobacco. He reports that he drinks alcohol. He reports that he does not use drugs.     Family History:   His family history includes Colon Cancer in his paternal aunt; High Blood Pressure in an other family member.     REVIEW OF SYSTEMS:  Positive ROS per HPI    General:    Denies Fever, Chills, Weight loss, Weight gain, Fatigue, Poor appetite, Headache  Mouth/Throat: Denies Sore mouth, sore throat, hoarseness  Respiratory: Denies Cough, sputum, shortness of breath  Cardiovascular: Denies Chest pain, palpitations, racing heart, unable to sleep flat  Gastrointestinal: Denies Difficulty swallowing, heartburn, nausea, vomiting, diarrhea, constipation, black stool, anal itching, abdominal pain  Genitourinary: Denies burning on urination, erectile problems, decreased libido  Blood/Lymphatics: Denies Easy bruising, Excess bleeding, Bleeding gums, Swollen lymph nodes  Endocrine: Denies Heat intolerance, Cold intolerance, Excess thirst, Excess urination  Musculoskeletal: Denies Joint pain, Joint swelling, Back pain, Muscle pain, Decreased mobility, Weakness  Neurological: Denies Lightheaded, Dizzy, Numbness, Tingling, Tremors, Seizures  Psychiatric: Denies Depression, nervous, Irritable, Excess sleep, Insomnia        Medications:    Objective:   Reviewed and updated in the patient's EMR.  Current Outpatient Prescriptions   Medication Sig Dispense Refill   ? ALPRAZolam (XANAX) 0.5 MG PO Tablet Take 1 tablet by mouth 3 times daily as needed for sleep or anxiety. 90 tablet 2   ? cyanocobalamin (VITAMIN B-12) 1000 MCG PO Tablet Take 1 tablet by mouth daily. 30 tablet 3   ? ergocalciferol (VITAMIN D-2) 50000 units PO Capsule Take 1 capsule by mouth once a week. 4 capsule 2   ? vortioxetine (TRINTELLIX) 20 MG PO Tablet Take 1 tablet by mouth daily. 90 tablet 1   ? zolpidem (AMBIEN) 10 MG PO Tablet Take 1 tablet by mouth nightly at

## 2017-11-12 DIAGNOSIS — E559 Vitamin D deficiency, unspecified: Principal | ICD-10-CM

## 2017-11-12 MED ORDER — VITAMIN D (ERGOCALCIFEROL) 1.25 MG (50000 UT) PO CAPS
2 refills | Status: CP
Start: 2017-11-12 — End: 2018-02-02

## 2017-11-14 ENCOUNTER — Encounter: Primary: Internal Medicine

## 2017-12-18 DIAGNOSIS — F330 Major depressive disorder, recurrent, mild: Principal | ICD-10-CM

## 2017-12-18 MED ORDER — TRINTELLIX 20 MG PO TABS
20 mg | Freq: Every day | ORAL | 1 refills | Status: CP
Start: 2017-12-18 — End: 2018-03-01

## 2018-02-02 DIAGNOSIS — E559 Vitamin D deficiency, unspecified: Principal | ICD-10-CM

## 2018-02-02 MED ORDER — VITAMIN D (ERGOCALCIFEROL) 1.25 MG (50000 UT) PO CAPS
2 refills | Status: CP
Start: 2018-02-02 — End: ?

## 2018-02-27 ENCOUNTER — Encounter: Attending: Internal Medicine | Primary: Internal Medicine

## 2018-03-01 ENCOUNTER — Ambulatory Visit: Attending: Physician Assistant | Primary: Internal Medicine

## 2018-03-01 DIAGNOSIS — E213 Hyperparathyroidism, unspecified: Secondary | ICD-10-CM

## 2018-03-01 DIAGNOSIS — F330 Major depressive disorder, recurrent, mild: Principal | ICD-10-CM

## 2018-03-01 DIAGNOSIS — F329 Major depressive disorder, single episode, unspecified: Secondary | ICD-10-CM

## 2018-03-01 DIAGNOSIS — F419 Anxiety disorder, unspecified: Principal | ICD-10-CM

## 2018-03-01 DIAGNOSIS — F411 Generalized anxiety disorder: Secondary | ICD-10-CM

## 2018-03-01 DIAGNOSIS — Z6825 Body mass index (BMI) 25.0-25.9, adult: Secondary | ICD-10-CM

## 2018-03-01 MED ORDER — ALPRAZOLAM 0.5 MG PO TABS
.5 mg | Freq: Three times a day (TID) | ORAL | 2 refills | Status: CP | PRN
Start: 2018-03-01 — End: 2018-06-05

## 2018-03-01 MED ORDER — VORTIOXETINE HBR 20 MG PO TABS
20 mg | Freq: Every day | ORAL | 3 refills | Status: CP
Start: 2018-03-01 — End: 2018-06-05

## 2018-05-30 DIAGNOSIS — E213 Hyperparathyroidism, unspecified: Secondary | ICD-10-CM

## 2018-05-30 DIAGNOSIS — N289 Disorder of kidney and ureter, unspecified: Secondary | ICD-10-CM

## 2018-05-30 DIAGNOSIS — E538 Deficiency of other specified B group vitamins: Principal | ICD-10-CM

## 2018-05-30 DIAGNOSIS — E559 Vitamin D deficiency, unspecified: Secondary | ICD-10-CM

## 2018-06-03 ENCOUNTER — Ambulatory Visit: Attending: Internal Medicine | Primary: Internal Medicine

## 2018-06-03 DIAGNOSIS — N289 Disorder of kidney and ureter, unspecified: Secondary | ICD-10-CM

## 2018-06-03 DIAGNOSIS — E559 Vitamin D deficiency, unspecified: Secondary | ICD-10-CM

## 2018-06-03 DIAGNOSIS — E538 Deficiency of other specified B group vitamins: Secondary | ICD-10-CM

## 2018-06-03 DIAGNOSIS — E213 Hyperparathyroidism, unspecified: Principal | ICD-10-CM

## 2018-06-05 ENCOUNTER — Ambulatory Visit: Attending: Internal Medicine | Primary: Internal Medicine

## 2018-06-05 DIAGNOSIS — Z6823 Body mass index (BMI) 23.0-23.9, adult: Secondary | ICD-10-CM

## 2018-06-05 DIAGNOSIS — E213 Hyperparathyroidism, unspecified: Secondary | ICD-10-CM

## 2018-06-05 DIAGNOSIS — F330 Major depressive disorder, recurrent, mild: Secondary | ICD-10-CM

## 2018-06-05 DIAGNOSIS — F329 Major depressive disorder, single episode, unspecified: Secondary | ICD-10-CM

## 2018-06-05 DIAGNOSIS — E538 Deficiency of other specified B group vitamins: Secondary | ICD-10-CM

## 2018-06-05 DIAGNOSIS — F411 Generalized anxiety disorder: Principal | ICD-10-CM

## 2018-06-05 DIAGNOSIS — F419 Anxiety disorder, unspecified: Secondary | ICD-10-CM

## 2018-06-05 MED ORDER — ALPRAZOLAM 1 MG PO TABS
1 mg | Freq: Three times a day (TID) | ORAL | 2 refills | Status: CP | PRN
Start: 2018-06-05 — End: ?

## 2018-06-05 MED ORDER — KETOCONAZOLE 2 % EX CREA
6 refills
Start: 2018-06-05 — End: ?

## 2018-06-05 MED ORDER — ALPRAZOLAM 0.5 MG PO TABS
.5 mg | Freq: Three times a day (TID) | ORAL | 2 refills | Status: CP | PRN
Start: 2018-06-05 — End: 2018-06-05

## 2018-06-05 MED ORDER — HYDROCODONE-ACETAMINOPHEN 5-325 MG PO TABS
ORAL_TABLET
Start: 2018-06-05 — End: ?

## 2018-06-05 MED ORDER — ESCITALOPRAM OXALATE 20 MG PO TABS
20 mg | Freq: Every day | ORAL | 3 refills | Status: CP
Start: 2018-06-05 — End: 2018-10-24

## 2018-10-23 DIAGNOSIS — F330 Major depressive disorder, recurrent, mild: Principal | ICD-10-CM

## 2018-10-24 MED ORDER — ESCITALOPRAM OXALATE 20 MG PO TABS
1 refills | Status: CP
Start: 2018-10-24 — End: ?

## 2018-12-04 ENCOUNTER — Ambulatory Visit: Attending: Internal Medicine | Primary: Internal Medicine

## 2018-12-04 DIAGNOSIS — Z23 Encounter for immunization: Principal | ICD-10-CM

## 2019-01-13 DIAGNOSIS — F411 Generalized anxiety disorder: Principal | ICD-10-CM

## 2019-01-15 ENCOUNTER — Ambulatory Visit: Attending: Internal Medicine | Primary: Internal Medicine

## 2019-01-15 DIAGNOSIS — Z6826 Body mass index (BMI) 26.0-26.9, adult: Secondary | ICD-10-CM

## 2019-01-15 DIAGNOSIS — F411 Generalized anxiety disorder: Secondary | ICD-10-CM

## 2019-01-15 DIAGNOSIS — F329 Major depressive disorder, single episode, unspecified: Secondary | ICD-10-CM

## 2019-01-15 DIAGNOSIS — E559 Vitamin D deficiency, unspecified: Secondary | ICD-10-CM

## 2019-01-15 DIAGNOSIS — F419 Anxiety disorder, unspecified: Principal | ICD-10-CM

## 2019-01-15 DIAGNOSIS — E213 Hyperparathyroidism, unspecified: Secondary | ICD-10-CM

## 2019-01-15 DIAGNOSIS — F330 Major depressive disorder, recurrent, mild: Principal | ICD-10-CM

## 2019-01-15 MED ORDER — ALPRAZOLAM 0.5 MG PO TABS
.5 mg | Freq: Every evening | ORAL | 3 refills | Status: CP | PRN
Start: 2019-01-15 — End: ?

## 2019-01-15 MED ORDER — ALPRAZOLAM 0.5 MG PO TABS: Start: 2019-01-15 — End: 2019-01-15

## 2019-01-15 MED ORDER — ALPRAZOLAM 1 MG PO TABS
1 mg | Freq: Three times a day (TID) | ORAL | 2 refills | PRN
Start: 2019-01-15 — End: ?

## 2019-01-15 MED ORDER — PROPRANOLOL HCL ER 60 MG PO CP24
60 mg | Freq: Every day | ORAL
Start: 2019-01-15 — End: ?

## 2019-01-15 MED ORDER — ESCITALOPRAM OXALATE 20 MG PO TABS
20 mg | Freq: Every day | ORAL | 3 refills | Status: CN
Start: 2019-01-15 — End: ?

## 2019-05-01 DIAGNOSIS — F330 Major depressive disorder, recurrent, mild: Principal | ICD-10-CM

## 2019-05-01 MED ORDER — ESCITALOPRAM OXALATE 20 MG PO TABS
1 refills | Status: CP
Start: 2019-05-01 — End: ?

## 2020-04-15 DIAGNOSIS — F330 Major depressive disorder, recurrent, mild: Principal | ICD-10-CM

## 2020-04-16 MED ORDER — ESCITALOPRAM OXALATE 20 MG PO TABS
1 refills
Start: 2020-04-16 — End: ?

## 2020-07-28 ENCOUNTER — Inpatient Hospital Stay (HOSPITAL_COMMUNITY): Payer: Self-pay

## 2020-07-28 ENCOUNTER — Emergency Department (HOSPITAL_COMMUNITY): Payer: Self-pay

## 2020-07-28 ENCOUNTER — Other Ambulatory Visit: Payer: Self-pay

## 2020-07-28 ENCOUNTER — Inpatient Hospital Stay (HOSPITAL_COMMUNITY)
Admission: EM | Admit: 2020-07-28 | Discharge: 2020-08-30 | DRG: 870 | Disposition: E | Payer: Self-pay | Attending: Internal Medicine | Admitting: Internal Medicine

## 2020-07-28 ENCOUNTER — Encounter (HOSPITAL_COMMUNITY): Payer: Self-pay

## 2020-07-28 DIAGNOSIS — J9601 Acute respiratory failure with hypoxia: Secondary | ICD-10-CM | POA: Diagnosis present

## 2020-07-28 DIAGNOSIS — Z8249 Family history of ischemic heart disease and other diseases of the circulatory system: Secondary | ICD-10-CM

## 2020-07-28 DIAGNOSIS — J9600 Acute respiratory failure, unspecified whether with hypoxia or hypercapnia: Secondary | ICD-10-CM

## 2020-07-28 DIAGNOSIS — F329 Major depressive disorder, single episode, unspecified: Secondary | ICD-10-CM | POA: Diagnosis present

## 2020-07-28 DIAGNOSIS — J9602 Acute respiratory failure with hypercapnia: Secondary | ICD-10-CM | POA: Diagnosis present

## 2020-07-28 DIAGNOSIS — R0902 Hypoxemia: Secondary | ICD-10-CM

## 2020-07-28 DIAGNOSIS — J438 Other emphysema: Secondary | ICD-10-CM | POA: Diagnosis present

## 2020-07-28 DIAGNOSIS — J441 Chronic obstructive pulmonary disease with (acute) exacerbation: Secondary | ICD-10-CM

## 2020-07-28 DIAGNOSIS — Z01818 Encounter for other preprocedural examination: Secondary | ICD-10-CM

## 2020-07-28 DIAGNOSIS — Z79899 Other long term (current) drug therapy: Secondary | ICD-10-CM

## 2020-07-28 DIAGNOSIS — K219 Gastro-esophageal reflux disease without esophagitis: Secondary | ICD-10-CM | POA: Diagnosis present

## 2020-07-28 DIAGNOSIS — J8410 Pulmonary fibrosis, unspecified: Secondary | ICD-10-CM | POA: Diagnosis present

## 2020-07-28 DIAGNOSIS — Z8709 Personal history of other diseases of the respiratory system: Secondary | ICD-10-CM

## 2020-07-28 DIAGNOSIS — R799 Abnormal finding of blood chemistry, unspecified: Secondary | ICD-10-CM | POA: Diagnosis present

## 2020-07-28 DIAGNOSIS — Z978 Presence of other specified devices: Secondary | ICD-10-CM

## 2020-07-28 DIAGNOSIS — I2721 Secondary pulmonary arterial hypertension: Secondary | ICD-10-CM | POA: Diagnosis present

## 2020-07-28 DIAGNOSIS — J432 Centrilobular emphysema: Secondary | ICD-10-CM | POA: Diagnosis present

## 2020-07-28 DIAGNOSIS — E870 Hyperosmolality and hypernatremia: Secondary | ICD-10-CM | POA: Diagnosis present

## 2020-07-28 DIAGNOSIS — Z87442 Personal history of urinary calculi: Secondary | ICD-10-CM

## 2020-07-28 DIAGNOSIS — R6520 Severe sepsis without septic shock: Secondary | ICD-10-CM | POA: Diagnosis present

## 2020-07-28 DIAGNOSIS — Z888 Allergy status to other drugs, medicaments and biological substances status: Secondary | ICD-10-CM

## 2020-07-28 DIAGNOSIS — I2699 Other pulmonary embolism without acute cor pulmonale: Secondary | ICD-10-CM

## 2020-07-28 DIAGNOSIS — Z781 Physical restraint status: Secondary | ICD-10-CM

## 2020-07-28 DIAGNOSIS — E872 Acidosis: Secondary | ICD-10-CM | POA: Diagnosis not present

## 2020-07-28 DIAGNOSIS — R570 Cardiogenic shock: Secondary | ICD-10-CM | POA: Diagnosis not present

## 2020-07-28 DIAGNOSIS — E119 Type 2 diabetes mellitus without complications: Secondary | ICD-10-CM

## 2020-07-28 DIAGNOSIS — J449 Chronic obstructive pulmonary disease, unspecified: Secondary | ICD-10-CM | POA: Diagnosis present

## 2020-07-28 DIAGNOSIS — Z7951 Long term (current) use of inhaled steroids: Secondary | ICD-10-CM

## 2020-07-28 DIAGNOSIS — J189 Pneumonia, unspecified organism: Secondary | ICD-10-CM | POA: Diagnosis present

## 2020-07-28 DIAGNOSIS — I50810 Right heart failure, unspecified: Secondary | ICD-10-CM | POA: Diagnosis present

## 2020-07-28 DIAGNOSIS — Z87891 Personal history of nicotine dependence: Secondary | ICD-10-CM

## 2020-07-28 DIAGNOSIS — Z515 Encounter for palliative care: Secondary | ICD-10-CM

## 2020-07-28 DIAGNOSIS — Z20822 Contact with and (suspected) exposure to covid-19: Secondary | ICD-10-CM | POA: Diagnosis present

## 2020-07-28 DIAGNOSIS — R6521 Severe sepsis with septic shock: Secondary | ICD-10-CM | POA: Diagnosis not present

## 2020-07-28 DIAGNOSIS — Z800 Family history of malignant neoplasm of digestive organs: Secondary | ICD-10-CM

## 2020-07-28 DIAGNOSIS — J9690 Respiratory failure, unspecified, unspecified whether with hypoxia or hypercapnia: Secondary | ICD-10-CM

## 2020-07-28 DIAGNOSIS — A419 Sepsis, unspecified organism: Principal | ICD-10-CM | POA: Diagnosis present

## 2020-07-28 DIAGNOSIS — E1165 Type 2 diabetes mellitus with hyperglycemia: Secondary | ICD-10-CM | POA: Diagnosis present

## 2020-07-28 DIAGNOSIS — Z7952 Long term (current) use of systemic steroids: Secondary | ICD-10-CM

## 2020-07-28 DIAGNOSIS — F411 Generalized anxiety disorder: Secondary | ICD-10-CM | POA: Diagnosis present

## 2020-07-28 DIAGNOSIS — Z66 Do not resuscitate: Secondary | ICD-10-CM | POA: Diagnosis not present

## 2020-07-28 DIAGNOSIS — G4700 Insomnia, unspecified: Secondary | ICD-10-CM | POA: Diagnosis present

## 2020-07-28 LAB — CBC WITH DIFFERENTIAL/PLATELET
Abs Immature Granulocytes: 0.05 10*3/uL (ref 0.00–0.07)
Basophils Absolute: 0 10*3/uL (ref 0.0–0.1)
Basophils Relative: 0 %
Eosinophils Absolute: 0.1 10*3/uL (ref 0.0–0.5)
Eosinophils Relative: 1 %
HCT: 45.6 % (ref 39.0–52.0)
Hemoglobin: 14.9 g/dL (ref 13.0–17.0)
Immature Granulocytes: 0 %
Lymphocytes Relative: 5 %
Lymphs Abs: 0.6 10*3/uL — ABNORMAL LOW (ref 0.7–4.0)
MCH: 29.6 pg (ref 26.0–34.0)
MCHC: 32.7 g/dL (ref 30.0–36.0)
MCV: 90.5 fL (ref 80.0–100.0)
Monocytes Absolute: 0.4 10*3/uL (ref 0.1–1.0)
Monocytes Relative: 4 %
Neutro Abs: 10.1 10*3/uL — ABNORMAL HIGH (ref 1.7–7.7)
Neutrophils Relative %: 90 %
Platelets: 225 10*3/uL (ref 150–400)
RBC: 5.04 MIL/uL (ref 4.22–5.81)
RDW: 14 % (ref 11.5–15.5)
WBC: 11.3 10*3/uL — ABNORMAL HIGH (ref 4.0–10.5)
nRBC: 0 % (ref 0.0–0.2)

## 2020-07-28 LAB — COMPREHENSIVE METABOLIC PANEL
ALT: 15 U/L (ref 0–44)
AST: 20 U/L (ref 15–41)
Albumin: 3.3 g/dL — ABNORMAL LOW (ref 3.5–5.0)
Alkaline Phosphatase: 53 U/L (ref 38–126)
Anion gap: 13 (ref 5–15)
BUN: 10 mg/dL (ref 6–20)
CO2: 22 mmol/L (ref 22–32)
Calcium: 8.7 mg/dL — ABNORMAL LOW (ref 8.9–10.3)
Chloride: 101 mmol/L (ref 98–111)
Creatinine, Ser: 0.86 mg/dL (ref 0.61–1.24)
GFR calc Af Amer: >60 mL/min (ref >60)
GFR calc non Af Amer: >60 mL/min (ref >60)
Glucose, Bld: 159 mg/dL — ABNORMAL HIGH (ref 70–99)
Potassium: 3.3 mmol/L — ABNORMAL LOW (ref 3.5–5.1)
Sodium: 136 mmol/L (ref 135–145)
Total Bilirubin: 1 mg/dL (ref 0.3–1.2)
Total Protein: 7.4 g/dL (ref 6.5–8.1)

## 2020-07-28 LAB — RESPIRATORY PANEL BY RT PCR (FLU A&B, COVID)
Influenza A by PCR: NEGATIVE
Influenza B by PCR: NEGATIVE
SARS Coronavirus 2 by RT PCR: NEGATIVE

## 2020-07-28 LAB — CBG MONITORING, ED
Glucose-Capillary: 165 mg/dL — ABNORMAL HIGH (ref 70–99)
Glucose-Capillary: 163 mg/dL — ABNORMAL HIGH (ref 70–99)
Glucose-Capillary: 144 mg/dL — ABNORMAL HIGH (ref 70–99)

## 2020-07-28 LAB — TROPONIN I (HIGH SENSITIVITY)
Troponin I (High Sensitivity): 15 ng/L (ref <18)
Troponin I (High Sensitivity): 16 ng/L (ref <18)

## 2020-07-28 LAB — BRAIN NATRIURETIC PEPTIDE: B Natriuretic Peptide: 157 pg/mL — ABNORMAL HIGH (ref 0.0–100.0)

## 2020-07-28 LAB — SARS CORONAVIRUS 2 BY RT PCR (HOSPITAL ORDER, PERFORMED IN ~~LOC~~ HOSPITAL LAB): SARS Coronavirus 2: NEGATIVE

## 2020-07-28 LAB — LACTIC ACID, PLASMA
Lactic Acid, Venous: 2.6 mmol/L (ref 0.5–1.9)
Lactic Acid, Venous: 2.6 mmol/L (ref 0.5–1.9)

## 2020-07-28 MED ORDER — ONDANSETRON HCL 4 MG PO TABS: 4.0000 mg | ORAL_TABLET | Freq: Four times a day (QID) | ORAL | Status: DC | PRN

## 2020-07-28 MED ORDER — SODIUM CHLORIDE 0.9 % IV SOLN
250.0000 mL | INTRAVENOUS | Status: DC | PRN
  Administered 2020-07-29: 250 mL via INTRAVENOUS

## 2020-07-28 MED ORDER — SODIUM CHLORIDE 0.9 % IV SOLN
500.0000 mg | Freq: Once | INTRAVENOUS | Status: AC
  Administered 2020-07-28: 500 mg via INTRAVENOUS
  Filled 2020-07-28: qty 500

## 2020-07-28 MED ORDER — IOHEXOL 350 MG/ML SOLN
100.0000 mL | Freq: Once | INTRAVENOUS | Status: AC | PRN
  Administered 2020-07-28: 100 mL via INTRAVENOUS

## 2020-07-28 MED ORDER — METHYLPREDNISOLONE SODIUM SUCC 125 MG IJ SOLR
125.0000 mg | Freq: Once | INTRAMUSCULAR | Status: AC
  Administered 2020-07-28: 125 mg via INTRAVENOUS
  Filled 2020-07-28: qty 2

## 2020-07-28 MED ORDER — METHYLPREDNISOLONE SODIUM SUCC 40 MG IJ SOLR
40.0000 mg | Freq: Three times a day (TID) | INTRAMUSCULAR | Status: DC
  Administered 2020-07-28 – 2020-08-02 (×14): 40 mg via INTRAVENOUS
  Filled 2020-07-28 (×16): qty 1

## 2020-07-28 MED ORDER — POLYETHYLENE GLYCOL 3350 17 G PO PACK
17.0000 g | PACK | Freq: Every day | ORAL | Status: DC
  Administered 2020-07-29: 17 g via ORAL
  Filled 2020-07-28 (×2): qty 1

## 2020-07-28 MED ORDER — ENOXAPARIN SODIUM 40 MG/0.4ML ~~LOC~~ SOLN: 40.0000 mg | SUBCUTANEOUS | Status: DC

## 2020-07-28 MED ORDER — SODIUM CHLORIDE 0.9 % IV SOLN
2.0000 g | Freq: Three times a day (TID) | INTRAVENOUS | Status: AC
  Administered 2020-07-28 – 2020-08-04 (×21): 2 g via INTRAVENOUS
  Filled 2020-07-28 (×21): qty 2

## 2020-07-28 MED ORDER — ALBUTEROL SULFATE (2.5 MG/3ML) 0.083% IN NEBU
2.5000 mg | INHALATION_SOLUTION | RESPIRATORY_TRACT | Status: DC | PRN
  Administered 2020-07-31: 2.5 mg via RESPIRATORY_TRACT
  Administered 2020-08-06: 5 mg via RESPIRATORY_TRACT
  Filled 2020-07-28 (×3): qty 3

## 2020-07-28 MED ORDER — ALPRAZOLAM 0.5 MG PO TABS
0.5000 mg | ORAL_TABLET | Freq: Two times a day (BID) | ORAL | Status: DC | PRN
  Administered 2020-07-28 – 2020-07-29 (×2): 0.5 mg via ORAL
  Filled 2020-07-28 (×2): qty 1

## 2020-07-28 MED ORDER — MAGNESIUM SULFATE 2 GM/50ML IV SOLN
2.0000 g | Freq: Once | INTRAVENOUS | Status: AC
  Administered 2020-07-28: 2 g via INTRAVENOUS
  Filled 2020-07-28: qty 50

## 2020-07-28 MED ORDER — PANTOPRAZOLE SODIUM 40 MG PO TBEC
40.0000 mg | DELAYED_RELEASE_TABLET | Freq: Every day | ORAL | Status: DC
  Administered 2020-07-28 – 2020-07-29 (×2): 40 mg via ORAL
  Filled 2020-07-28 (×2): qty 1

## 2020-07-28 MED ORDER — ACETAMINOPHEN 325 MG PO TABS
650.0000 mg | ORAL_TABLET | Freq: Four times a day (QID) | ORAL | Status: DC | PRN
  Administered 2020-07-28 – 2020-07-29 (×2): 650 mg via ORAL
  Filled 2020-07-28 (×2): qty 2

## 2020-07-28 MED ORDER — VANCOMYCIN HCL IN DEXTROSE 1-5 GM/200ML-% IV SOLN
1000.0000 mg | Freq: Three times a day (TID) | INTRAVENOUS | Status: DC
  Administered 2020-07-29 – 2020-07-30 (×4): 1000 mg via INTRAVENOUS
  Filled 2020-07-28 (×5): qty 200

## 2020-07-28 MED ORDER — ONDANSETRON HCL 4 MG/2ML IJ SOLN: 4.0000 mg | Freq: Four times a day (QID) | INTRAMUSCULAR | Status: DC | PRN

## 2020-07-28 MED ORDER — BISOPROLOL FUMARATE 5 MG PO TABS
5.0000 mg | ORAL_TABLET | Freq: Every day | ORAL | Status: DC
  Administered 2020-07-28 – 2020-07-29 (×2): 5 mg via ORAL
  Filled 2020-07-28 (×3): qty 1

## 2020-07-28 MED ORDER — IPRATROPIUM-ALBUTEROL 0.5-2.5 (3) MG/3ML IN SOLN
3.0000 mL | Freq: Once | RESPIRATORY_TRACT | Status: AC
  Administered 2020-07-28: 3 mL via RESPIRATORY_TRACT
  Filled 2020-07-28: qty 3

## 2020-07-28 MED ORDER — INSULIN ASPART 100 UNIT/ML ~~LOC~~ SOLN
0.0000 [IU] | Freq: Three times a day (TID) | SUBCUTANEOUS | Status: DC
  Administered 2020-07-28 – 2020-07-29 (×3): 1 [IU] via SUBCUTANEOUS
  Filled 2020-07-28 (×3): qty 1

## 2020-07-28 MED ORDER — SODIUM CHLORIDE 0.9 % IV SOLN
1.0000 g | Freq: Once | INTRAVENOUS | Status: AC
  Administered 2020-07-28: 1 g via INTRAVENOUS
  Filled 2020-07-28: qty 10

## 2020-07-28 MED ORDER — DULOXETINE HCL 30 MG PO CPEP
60.0000 mg | ORAL_CAPSULE | Freq: Every day | ORAL | Status: DC
  Administered 2020-07-28 – 2020-07-30 (×3): 60 mg via ORAL
  Filled 2020-07-28 (×6): qty 2

## 2020-07-28 MED ORDER — IPRATROPIUM-ALBUTEROL 0.5-2.5 (3) MG/3ML IN SOLN
3.0000 mL | Freq: Four times a day (QID) | RESPIRATORY_TRACT | Status: DC
  Administered 2020-07-28 – 2020-07-31 (×12): 3 mL via RESPIRATORY_TRACT
  Filled 2020-07-28 (×12): qty 3

## 2020-07-28 MED ORDER — IBUPROFEN 400 MG PO TABS: 400.0000 mg | ORAL_TABLET | Freq: Once | ORAL | Status: DC

## 2020-07-28 MED ORDER — SODIUM CHLORIDE 0.9 % IV SOLN: 1.0000 g | INTRAVENOUS | Status: DC

## 2020-07-28 MED ORDER — ENOXAPARIN SODIUM 100 MG/ML ~~LOC~~ SOLN
1.0000 mg/kg | Freq: Two times a day (BID) | SUBCUTANEOUS | Status: DC
  Administered 2020-07-28 – 2020-07-31 (×6): 90 mg via SUBCUTANEOUS
  Filled 2020-07-28: qty 1
  Filled 2020-07-28 (×3): qty 0.9
  Filled 2020-07-28 (×3): qty 1

## 2020-07-28 MED ORDER — SODIUM CHLORIDE 0.9% FLUSH: 3.0000 mL | INTRAVENOUS | Status: DC | PRN

## 2020-07-28 MED ORDER — IPRATROPIUM-ALBUTEROL 0.5-2.5 (3) MG/3ML IN SOLN
3.0000 mL | Freq: Once | RESPIRATORY_TRACT | Status: AC
  Administered 2020-07-28: 3 mL via RESPIRATORY_TRACT

## 2020-07-28 MED ORDER — GUAIFENESIN ER 600 MG PO TB12
600.0000 mg | ORAL_TABLET | Freq: Two times a day (BID) | ORAL | Status: DC
  Administered 2020-07-28 – 2020-07-29 (×3): 600 mg via ORAL
  Filled 2020-07-28 (×4): qty 1

## 2020-07-28 MED ORDER — TRAZODONE HCL 50 MG PO TABS: 50.0000 mg | ORAL_TABLET | Freq: Every evening | ORAL | Status: DC | PRN

## 2020-07-28 MED ORDER — VANCOMYCIN HCL 2000 MG/400ML IV SOLN
2000.0000 mg | Freq: Once | INTRAVENOUS | Status: AC
  Administered 2020-07-28: 2000 mg via INTRAVENOUS
  Filled 2020-07-28: qty 400

## 2020-07-28 MED ORDER — SODIUM CHLORIDE 0.9 % IV SOLN
500.0000 mg | INTRAVENOUS | Status: DC
  Administered 2020-07-29 – 2020-08-01 (×4): 500 mg via INTRAVENOUS
  Filled 2020-07-28 (×5): qty 500

## 2020-07-28 MED ORDER — MOMETASONE FURO-FORMOTEROL FUM 200-5 MCG/ACT IN AERO
2.0000 | INHALATION_SPRAY | Freq: Two times a day (BID) | RESPIRATORY_TRACT | Status: DC
  Administered 2020-07-28: 2 via RESPIRATORY_TRACT
  Filled 2020-07-28: qty 8.8

## 2020-07-28 MED ORDER — SODIUM CHLORIDE 0.9% FLUSH
3.0000 mL | Freq: Two times a day (BID) | INTRAVENOUS | Status: DC
  Administered 2020-07-28 – 2020-07-30 (×3): 3 mL via INTRAVENOUS

## 2020-07-28 MED ORDER — INSULIN ASPART 100 UNIT/ML ~~LOC~~ SOLN: 0.0000 [IU] | Freq: Every day | SUBCUTANEOUS | Status: DC

## 2020-07-28 NOTE — ED Triage Notes (Signed)
EMS reports pt has had a productive cough x 2 weeks.  Reports pcp put him on doxycycline.  Reports finished antibiotics 1 week ago but didn't improve.   This morning pt woke up in resp distress and o2 sat at home was 59%.  EMS arrived and o2 sat was 70's.  Pt not on home o2.  Has history of coped and pulmonary fibrosis.  EMS put pt on nrb, o2 sat increased to 90's.  EMS gave 5mg  albuterol and 0.5 mg atrovent neb and 125mg  solumedrol.  Reports sob improved, 02 sat 90% on NRB.  Reports o2 sat dropped ot 80's off of the NRB.  PT alert and oriented.  Pt says feels better.   Reports tested negative for covid 2 weeks ago.

## 2020-07-28 NOTE — ED Provider Notes (Addendum)
[facility] EMERGENCY DEPARTMENT Provider Note   CSN: [id number] Arrival date & time: 07/14/2020  1700     History Chief Complaint  Patient presents with  . Shortness of Breath    Bradley [name] is a 59 y.o. male.  Patient is a 59 year old male with past medical history of COPD, pulmonary fibrosis, diabetes.  He presents today for evaluation of shortness of breath.  This has been worsening over the past 2 weeks, but became much worse last night.  His oxygen saturations at home were in the upper 50s.  EMS was called and patient was transported here.  He was given Solu-Medrol and a breathing treatment in route.  Patient denies fevers, chills, or productive cough.  He denies any exposures to known Covid individuals.  He did have a Covid test 2 weeks ago that was negative.  He was prescribed antibiotics at that time by his primary doctor, but did not help.  He arrives here by EMS in moderate respiratory distress.  He is receiving oxygen by nonrebreather.  The history is provided by the patient.  Shortness of Breath Severity:  Severe Onset quality:  Gradual Duration:  2 weeks Timing:  Constant Progression:  Worsening Chronicity:  Recurrent Relieved by:  Nothing Worsened by:  Nothing Ineffective treatments: Antibiotics. Associated symptoms: no chest pain, no cough and no fever        Past Medical History:  Diagnosis Date  . Anxiety    Citalopram controls "panic attacks"  . Asthma   . COPD (chronic obstructive pulmonary disease) (HCC)   . Diabetes mellitus type 2 without retinopathy (HCC)   . Dyspnea    with exertion  . History of kidney stones    x3-4 - lithotripsy  . Pneumonia    08/11/16- 3 years ago  . Pulmonary fibrosis [facility])     Patient Active Problem List   Diagnosis Date Noted  . COPD GOLD II 12/07/2016  . Dyspnea on exertion 12/06/2016  . Post-operative pain 10/19/2016  . Peripheral edema   . Anxiety state   . Dysphagia   . Hypokalemia   . Generalized  anxiety disorder   . Leukocytosis   . Diabetes mellitus type 2 without retinopathy (HCC)   . Debility 09/15/2016  . Anoxic encephalopathy (HCC)   . Tracheostomy status (HCC)   . ARDS (adult respiratory distress syndrome) (HCC)   . Acute respiratory failure with hypoxia (HCC) 08/18/2016  . Community acquired pneumonia 08/18/2016  . COPD exacerbation (HCC) 08/18/2016  . Depression 08/18/2016  . Sepsis (HCC) 08/18/2016  . Complete tear of right rotator cuff 08/14/2016  . Right rotator cuff tear 08/14/2016  . Cigarette smoker 07/23/2015  . Obesity 07/23/2015  . Pulmonary fibrosis (HCC) 07/22/2015    Past Surgical History:  Procedure Laterality Date  . COLONOSCOPY W/ POLYPECTOMY    . LITHOTRIPSY    . MULTIPLE TOOTH EXTRACTIONS     19 teeth extracted at once  . SHOULDER ARTHROSCOPY WITH ROTATOR CUFF REPAIR Right 08/14/2016   Procedure: RIGHT SHOULDER ARTHROSCOPY,DEBRIDEMENT, OPEN ROTATOR CUFF REPAIR;  Surgeon: Eldred Manges, MD;  Location: [facility] OR;  Service: Orthopedics;  Laterality: Right;  . TONSILLECTOMY         Family History  Problem Relation Age of Onset  . Heart disease Mother   . Colon cancer Mother     Social History   Tobacco Use  . Smoking status: Former Smoker    Packs/day: 1.00    Years: 30.00    Pack  years: 30.00    Types: Cigarettes    Quit date: 08/16/2016    Years since quitting: 3.9  . Smokeless tobacco: Never Used  Substance Use Topics  . Alcohol use: No    Alcohol/week: 0.0 standard drinks  . Drug use: No    Home Medications Prior to Admission medications   Medication Sig Start Date End Date Taking? Authorizing Provider  albuterol (PROAIR HFA) 108 (90 Base) MCG/ACT inhaler Inhale 2 puffs into the lungs every 6 (six) hours as needed for wheezing or shortness of breath.    [provider]  ALPRAZolam Prudy Feeler) 1 MG tablet Take 1 tablet (1 mg total) by mouth 3 (three) times daily as needed for anxiety. 10/19/16   Marcello Fennel, MD    bisoprolol (ZEBETA) 5 MG tablet Take 1 tablet (5 mg total) by mouth daily. Patient not taking: Reported on 12/19/2016 12/06/16   Nyoka Cowden, MD  budesonide-formoterol [facility]) 160-4.5 MCG/ACT inhaler Inhale 2 puffs into the lungs 2 (two) times daily. Patient not taking: Reported on 12/19/2016 12/06/16   Nyoka Cowden, MD  famotidine (PEPCID) 20 MG tablet One at bedtime 12/06/16   Nyoka Cowden, MD  fexofenadine-pseudoephedrine (ALLEGRA-D) 60-120 MG 12 hr tablet Take 1 tablet by mouth every morning.     [provider]  methocarbamol (ROBAXIN) 500 MG tablet Take 1 tablet (500 mg total) by mouth every 6 (six) hours as needed for muscle spasms. 10/13/16   Marcello Fennel, MD  pantoprazole (PROTONIX) 40 MG tablet TAKE 1 TABLET (40 MG TOTAL) BY MOUTH DAILY. TAKE 30-60 MIN BEFORE FIRST MEAL OF THE DAY 03/21/17   Nyoka Cowden, MD  polyethylene glycol [facility] / GLYCOLAX) packet Take 17 g by mouth daily. 09/28/16   Love, Evlyn Kanner, PA-C  predniSONE (DELTASONE) 10 MG tablet Take 10 mg by mouth daily with breakfast.    [provider]  sertraline (ZOLOFT) 50 MG tablet Take 1 tablet by mouth daily. 11/22/16   [provider]    Allergies    Wellbutrin [bupropion]  Review of Systems   Review of Systems  Constitutional: Negative for fever.  Respiratory: Positive for shortness of breath. Negative for cough.   Cardiovascular: Negative for chest pain.  All other systems reviewed and are negative.   Physical Exam Updated Vital Signs BP (!) 135/98 (BP Location: Right Arm)   Pulse (!) 118   Temp 98.8 F (37.1 C) (Oral)   Resp (!) 24   Ht 5\' 9"  (1.753 m)   Wt 88.9 kg   SpO2 98%   BMI 28.94 kg/m   Physical Exam Vitals and nursing note reviewed.  Constitutional:      General: He is not in acute distress.    Appearance: He is well-developed. He is not diaphoretic.  HENT:     Head: Normocephalic and atraumatic.  Cardiovascular:     Rate and Rhythm: Normal rate  and regular rhythm.     Heart sounds: No murmur heard.  No friction rub.  Pulmonary:     Effort: Tachypnea, accessory muscle usage and respiratory distress present.     Breath sounds: Examination of the right-middle field reveals rhonchi. Examination of the left-middle field reveals rhonchi. Rhonchi present. No wheezing or rales.  Abdominal:     General: Bowel sounds are normal. There is no distension.     Palpations: Abdomen is soft.     Tenderness: There is no abdominal tenderness.  Musculoskeletal:  General: Normal range of motion.     Cervical back: Normal range of motion and neck supple.  Skin:    General: Skin is warm and dry.  Neurological:     Mental Status: He is alert and oriented to person, place, and time.     Coordination: Coordination normal.     ED Results / Procedures / Treatments   Labs (all labs ordered are listed, but only abnormal results are displayed) Labs Reviewed  RESPIRATORY PANEL BY RT PCR (FLU A&B, COVID)  COMPREHENSIVE METABOLIC PANEL  BRAIN NATRIURETIC PEPTIDE  CBC WITH DIFFERENTIAL/PLATELET  TROPONIN I (HIGH SENSITIVITY)    EKG EKG Interpretation  Date/Time:  Wednesday 20-Aug-2020 08:21:08 EDT Ventricular Rate:  115 PR Interval:    QRS Duration: 87 QT Interval:  336 QTC Calculation: 465 R Axis:   112 Text Interpretation: Sinus tachycardia Probable left atrial enlargement Right axis deviation RSR' in V1 or V2, probably normal variant No significant change since 08/30/2016 Confirmed by Geoffery Lyons ([id number]) on Aug 20, 2020 8:46:14 AM   Radiology No results found.  Procedures Procedures (including critical care time)  Medications Ordered in ED Medications  magnesium sulfate IVPB 2 g 50 mL (has no administration in time range)    ED Course  I have reviewed the triage vital signs and the nursing notes.  Pertinent labs & imaging results that were available during my care of the patient were reviewed by me and considered in my  medical decision making (see chart for details).    MDM Rules/Calculators/A&P  Patient with history of COPD and pulmonary fibrosis presenting with complaints of shortness of breath.  This began 2 weeks ago, however significantly worsened last night.  He was found to have oxygen saturations in the upper 50s by EMS.  He was placed on oxygen by nonrebreather, then transported here.  Patient arrives here with a significant oxygen requirement.  This was continued and unchanged after receiving steroids, magnesium, and albuterol nebulizer treatments.  His x-ray shows what appears to be multifocal pneumonia, however his Covid test is negative.  Patient treated for community-acquired pneumonia with Rocephin and Zithromax.  Patient to be admitted to the hospitalist service under the care of Dr. Mariea Clonts.  A CTA of the chest will be obtained to rule out pulmonary embolism as the cause of his acute hypoxia.  CRITICAL CARE Performed by: Geoffery Lyons Total critical care time: 35 minutes Critical care time was exclusive of separately billable procedures and treating other patients. Critical care was necessary to treat or prevent imminent or life-threatening deterioration. Critical care was time spent personally by me on the following activities: development of treatment plan with patient and/or surrogate as well as nursing, discussions with consultants, evaluation of patient's response to treatment, examination of patient, obtaining history from patient or surrogate, ordering and performing treatments and interventions, ordering and review of laboratory studies, ordering and review of radiographic studies, pulse oximetry and re-evaluation of patient's condition.  Final Clinical Impression(s) / ED Diagnoses Final diagnoses:  None    Rx / DC Orders ED Discharge Orders    None       Geoffery Lyons, MD Aug 20, 2020 1334    Geoffery Lyons, MD 08/18/2020 1113

## 2020-07-28 NOTE — ED Notes (Signed)
trialed pt on 6liters [place] but pt's sat decreased to 88%.  EDP at bedside and was instructed to put on NRB.  O2 sat 96%.

## 2020-07-28 NOTE — ED Notes (Addendum)
CT tech reported to this RN that after transferring pt to CT machine and laying pt flat on his back that he became extremely SOB with increased WOB and had to sit straight up to catch his breath. Pt was unable to complete CT scan due to inadequate positioning. CT tech also notified Dr. Mariea Clonts.

## 2020-07-28 NOTE — ED Notes (Signed)
RT at bedside and reported to this RN that as pt was using the urinal in bed, without moving his legs to the side, his O2 sat dropped to 73% on 15L [facility].

## 2020-07-28 NOTE — H&P (Signed)
Patient Demographics:    Bradley [name], is a 59 y.o. 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   Community acquired pneumonia   Diabetes mellitus type 2 without retinopathy (HCC)   COPD GOLD II/centrilobular and paraseptal emphysema    1)Sepsis secondary to multifocal pneumonia--superimposed on underlying pulmonary fibrosis and COPD--- -patient meets sepsis criteria with leukocytosis, tachypnea, tachycardia and new onset hypoxia in the setting of multifocal pneumonia --Blood cultures and Legionella antigen pending -Patient received Rocephin and azithromycin in the ED -Given severity of symptoms we will switch to Vanco and cefepime, given chest x-ray findings suggestive of possible atypical infection we will leave azithromycin on board --Lactic acid ordered and pending -Patient was vaccinated against COVID-19 infection (first dose 04/20/2020 secondary 05/11/2020) -COVID-19 test x2 - today  2) COPD/centrilobular and paraseptal emphysema--- flareup of COPD secondary to pneumonia as above #1 --Steroids and bronchodilators along with mucolytics-  3)H/o pulmonary fibrosis-- Not UIP pattern, previous work-up Aoutoimmune and vasculitis were  Negative --Steroids and bronchodilators as ordered  4) acute hypoxic respiratory failure--- secondary to #1, #2 #3 above --Patient requiring high flow nasal cannula at 15 L/min to keep O2 sats close to 90 -Treat as above -Given severity of hypoxia there was concern for possible PE--- patient unable to lay  flat for PE study -Lower extremity venous Dopplers negative for DVT -Empiric treatment with therapeutic Lovenox for now --Troponin not elevated, BNP 157 -Echo requested  5) social/ethics--plan of care discussed with patient and patient's stepdaughter Ms. 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  Disposition/Need for in-Hospital Stay- patient unable to be discharged at this time due to --sepsis secondary to pneumonia with severe acute hypoxic respiratory failure requiring IV antibiotics, supplemental oxygen and further work-up  Status is: Inpatient  Remains inpatient appropriate because:sepsis secondary to pneumonia with severe acute hypoxic respiratory failure requiring IV antibiotics, supplemental oxygen and further work-up   Dispo: The patient is from: Home              Anticipated d/c is to: Home              Anticipated d/c date is: > 3 days              Patient currently is not medically stable to d/c. Barriers: Not Clinically Stable- sepsis secondary to pneumonia with severe acute hypoxic respiratory failure requiring IV antibiotics, supplemental oxygen and further work-up  With History of - Reviewed by me  Past Medical History:  Diagnosis Date  . Anxiety  Citalopram controls "panic attacks"  . Asthma   . COPD (chronic obstructive pulmonary disease) (HCC)   . Diabetes mellitus type 2 without retinopathy (HCC)   . Dyspnea    with exertion  . History of kidney stones    x3-4 - lithotripsy  . Pneumonia    08/11/16- 3 years ago  . Pulmonary fibrosis (HCC)       Past Surgical History:  Procedure Laterality Date  . COLONOSCOPY W/ POLYPECTOMY    . LITHOTRIPSY    . MULTIPLE TOOTH EXTRACTIONS     19 teeth extracted at once  . SHOULDER ARTHROSCOPY WITH ROTATOR CUFF REPAIR Right 08/14/2016   Procedure: RIGHT SHOULDER  ARTHROSCOPY,DEBRIDEMENT, OPEN ROTATOR CUFF REPAIR;  Surgeon: Eldred MangesMark C Yates, MD;  Location: [facility] OR;  Service: Orthopedics;  Laterality: Right;  . TONSILLECTOMY      Chief Complaint  Patient presents with  . Shortness of Breath      HPI:    Bradley [name]  is a 59 y.o. male reformed smoker with past medical history relevant for COPD/emphysema, pulmonary fibrosis and anxiety disorder who presents to the ED with worsening shortness of breath for the last several days --- Productive cough and worsening respiratory status apparently started a couple weeks ago -He was treated empirically as outpatient with antibiotics by his PCP -Had a negative Covid test couple weeks ago -Symptoms worsened -EMS reports O2 sats was down to 70% when they arrived-  --Here in the ED -- Pt got up to side of bed with stand by assist to change out of wet boxers/pants. Pt had no difficulty with moving. His WOB increased tremendously and O2 sats dropped down into the 70's with HFNC at 15L. Pt was switched to NRB at 15L and after 30 minutes his WOB decreased back to his baseline since being in the ED with O2 sat 91%. Pt was switched back to HFNC at 15L per his request and O2 sat decreased back down to 83% and his WOB started increasing slightly. Pt then transitioned back to NRB at 15L with O2 sat increasing to 91% and WOB back to baseline.  -Chest x-ray suggestive of multifocal pneumonia superimposed on underlying COPD and pulmonary fibrosis -Patient received Rocephin and azithromycin in the ED ---Lactic acid ordered and pending -Patient was vaccinated against COVID-19 infection (first dose 04/20/2020 secondary 05/11/2020) -COVID-19 test x2 - today -Troponin not elevated, BNP 157  I called and obtain additional history from patient's stepdaughter Ms. 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    Social History:  Reviewed by me    Social History   Tobacco Use  . Smoking status: Former Smoker    Packs/day: 1.00    Years: 30.00    Pack years: 30.00    Types: Cigarettes    Quit date: 08/16/2016    Years since quitting: 3.9  . Smokeless tobacco: Never Used  Substance Use Topics  . Alcohol use: No    Alcohol/week: 0.0 standard drinks       Family History :  Reviewed by me    Family History  Problem Relation Age of Onset  . Heart disease Mother   .  Colon cancer Mother      Home Medications:   Prior to Admission medications   Medication Sig Start Date End Date Taking? Authorizing Provider  albuterol (PROAIR HFA) 108 (90 Base) MCG/ACT inhaler Inhale 2 puffs into the lungs every 6 (six) hours as needed for wheezing or shortness of breath.   Yes [provider]  DULoxetine (CYMBALTA) 60 MG capsule Take 60 mg by mouth daily.   Yes [provider]  ALPRAZolam Prudy Feeler) 1 MG tablet Take 1 tablet (1 mg total) by mouth 3 (three) times daily as needed for anxiety. Patient not taking: Reported on 07/15/2020 10/19/16   Marcello Fennel, MD  bisoprolol (ZEBETA) 5 MG tablet Take 1 tablet (5 mg total) by mouth daily. Patient not taking: Reported on 12/19/2016 12/06/16   Nyoka Cowden, MD  budesonide-formoterol [facility]) 160-4.5 MCG/ACT inhaler Inhale 2 puffs into the lungs 2 (two) times daily. Patient not taking: Reported on 12/19/2016 12/06/16   Nyoka Cowden, MD  famotidine (PEPCID) 20 MG tablet One at bedtime Patient not taking: Reported on 07/14/2020 12/06/16   Nyoka Cowden, MD  methocarbamol (ROBAXIN) 500 MG tablet Take 1 tablet (500 mg total) by mouth every 6 (six) hours as needed for muscle spasms. Patient not taking: Reported on 07/02/2020 10/13/16   Marcello Fennel, MD  pantoprazole (PROTONIX) 40 MG  tablet TAKE 1 TABLET (40 MG TOTAL) BY MOUTH DAILY. TAKE 30-60 MIN BEFORE FIRST MEAL OF THE DAY Patient not taking: Reported on 07/07/2020 03/21/17   Nyoka Cowden, MD  polyethylene glycol [facility] / GLYCOLAX) packet Take 17 g by mouth daily. Patient not taking: Reported on 07/05/2020 09/28/16   Jacquelynn Cree, PA-C     Allergies:     Allergies  Allergen Reactions  . Wellbutrin [Bupropion] Cough     Physical Exam:   Vitals  Blood pressure 121/78, pulse 94, temperature 98.8 F (37.1 C), temperature source Oral, resp. rate (!) 33, height  (1.753 m), weight 88.9 kg, SpO2 91 %.  Temp:  [98.8 F (37.1 C)] 98.8 F (37.1 C) (09/29 0821) Pulse Rate:  [93-121] 94 (09/29 1630) Resp:  [23-38] 33 (09/29 1630) BP: (99-135)/(71-98) 121/78 (09/29 1630) SpO2:  [78 %-100 %] 91 % (09/29 1630) FiO2 (%):  [100 %] 100 % (09/29 1110) Weight:  [88.9 kg] 88.9 kg (09/29 0816)   Physical Examination: General appearance - alert, well appearing, and in respiratory distress with significant conversational dyspnea  mental status - alert, oriented to person, place, and time,  Eyes - sclera anicteric Nose- HFNC 15L/min Neck - supple, no JVD elevation , Chest -Velcro type crackles consistent with pulmonary fibrosis, some superimposed rhonchi  heart - S1 and S2 normal, regular  Abdomen - soft, nontender, nondistended, no masses or organomegaly Neurological - screening mental status exam normal, neck supple without rigidity, cranial nerves II through XII intact, DTR's normal and symmetric Extremities - no pedal edema noted, intact peripheral pulses  Skin - warm, dry     Data Review:    CBC Recent Labs  Lab 07/06/2020 0840  WBC 11.3*  HGB 14.9  HCT 45.6  PLT 225  MCV 90.5  MCH 29.6  MCHC 32.7  RDW 14.0  LYMPHSABS 0.6*  MONOABS 0.4  EOSABS 0.1  BASOSABS 0.0   ------------------------------------------------------------------------------------------------------------------  Chemistries   Recent Labs  Lab 07/19/2020 0840  NA 136  K 3.3*  CL 101  CO2 22  GLUCOSE 159*  BUN 10  CREATININE 0.86  CALCIUM 8.7*  AST 20  ALT 15  ALKPHOS 53  BILITOT 1.0   ------------------------------------------------------------------------------------------------------------------ estimated creatinine clearance is 102 mL/min (by C-G formula based on SCr of 0.86 mg/dL). ------------------------------------------------------------------------------------------------------------------ No results for input(s): TSH, T4TOTAL, T3FREE, THYROIDAB in the last 72 hours.  Invalid input(s): FREET3   Coagulation profile No results for input(s): INR, PROTIME in the last 168 hours. ------------------------------------------------------------------------------------------------------------------- No results for input(s): DDIMER in the last 72 hours. -------------------------------------------------------------------------------------------------------------------  Cardiac Enzymes No results for input(s): CKMB, TROPONINI, MYOGLOBIN in the last 168 hours.  Invalid input(s): CK ------------------------------------------------------------------------------------------------------------------    Component Value Date/Time   BNP 157.0 (H) 2020-08-25 0840     ---------------------------------------------------------------------------------------------------------------  Urinalysis No results found for: COLORURINE, APPEARANCEUR, LABSPEC, PHURINE, GLUCOSEU, HGBUR, BILIRUBINUR, KETONESUR, PROTEINUR, UROBILINOGEN, NITRITE, LEUKOCYTESUR  ----------------------------------------------------------------------------------------------------------------   Imaging Results:    US Venous Img Lower Bilateral (DVT)  Result Date: Aug 25, 2020 CLINICAL DATA:  Pulmonary emboli EXAM: BILATERAL LOWER EXTREMITY VENOUS DOPPLER ULTRASOUND TECHNIQUE: Gray-scale sonography with graded compression, as well as color Doppler  and duplex ultrasound were performed to evaluate the lower extremity deep venous systems from the level of the common femoral vein and including the common femoral, femoral, profunda femoral, popliteal and calf veins including the posterior tibial, peroneal and gastrocnemius veins when visible. The superficial great saphenous vein was also interrogated. Spectral Doppler was utilized to evaluate flow at rest and with distal augmentation maneuvers in the common femoral, femoral and popliteal veins. COMPARISON:  None. FINDINGS: RIGHT LOWER EXTREMITY Common Femoral Vein: No evidence of thrombus. Normal compressibility, respiratory phasicity and response to augmentation. Saphenofemoral Junction: No evidence of thrombus. Normal compressibility and flow on color Doppler imaging. Profunda Femoral Vein: No evidence of thrombus. Normal compressibility and flow on color Doppler imaging. Femoral Vein: No evidence of thrombus. Normal compressibility, respiratory phasicity and response to augmentation. Popliteal Vein: No evidence of thrombus. Normal compressibility, respiratory phasicity and response to augmentation. Calf Veins: No evidence of thrombus. Normal compressibility and flow on color Doppler imaging. LEFT LOWER EXTREMITY Common Femoral Vein: No evidence of thrombus. Normal compressibility, respiratory phasicity and response to augmentation. Saphenofemoral Junction: No evidence of thrombus. Normal compressibility and flow on color Doppler imaging. Profunda Femoral Vein: No evidence of thrombus. Normal compressibility and flow on color Doppler imaging. Femoral Vein: No evidence of thrombus. Normal compressibility, respiratory phasicity and response to augmentation. Popliteal Vein: No evidence of thrombus. Normal compressibility, respiratory phasicity and response to augmentation. Calf Veins: No evidence of thrombus. Normal compressibility and flow on color Doppler imaging. IMPRESSION: No evidence of deep venous thrombosis  in either lower extremity. Electronically Signed   By: Judie Petit.  Shick M.D.   On: 2020/08/25 16:57   DG Chest Port 1 View  Result Date: 2020/08/25 CLINICAL DATA:  Shortness of breath and cough EXAM: PORTABLE CHEST 1 VIEW COMPARISON:  December 06, 2016 FINDINGS: There is underlying scarring and fibrotic type change. There is ill-defined airspace opacity in each lower lung region as well as to a lesser degree in the upper lobe regions. The heart size and pulmonary vascularity are normal. No adenopathy. No bone lesions IMPRESSION: Suspect multifocal pneumonia superimposed on fibrosis. Atypical organism pneumonia may well be present in this circumstance. Correlation with COVID-19 status advised. Heart size normal.  No adenopathy appreciable. Electronically Signed   By: Bretta Bang III M.D.   On: 2020/08/25 09:01    Radiological Exams on Admission: US Venous Img Lower Bilateral (DVT)  Result Date: August 25, 2020 CLINICAL DATA:  Pulmonary emboli EXAM: BILATERAL LOWER EXTREMITY  VENOUS DOPPLER ULTRASOUND TECHNIQUE: Gray-scale sonography with graded compression, as well as color Doppler and duplex ultrasound were performed to evaluate the lower extremity deep venous systems from the level of the common femoral vein and including the common femoral, femoral, profunda femoral, popliteal and calf veins including the posterior tibial, peroneal and gastrocnemius veins when visible. The superficial great saphenous vein was also interrogated. Spectral Doppler was utilized to evaluate flow at rest and with distal augmentation maneuvers in the common femoral, femoral and popliteal veins. COMPARISON:  None. FINDINGS: RIGHT LOWER EXTREMITY Common Femoral Vein: No evidence of thrombus. Normal compressibility, respiratory phasicity and response to augmentation. Saphenofemoral Junction: No evidence of thrombus. Normal compressibility and flow on color Doppler imaging. Profunda Femoral Vein: No evidence of thrombus. Normal  compressibility and flow on color Doppler imaging. Femoral Vein: No evidence of thrombus. Normal compressibility, respiratory phasicity and response to augmentation. Popliteal Vein: No evidence of thrombus. Normal compressibility, respiratory phasicity and response to augmentation. Calf Veins: No evidence of thrombus. Normal compressibility and flow on color Doppler imaging. LEFT LOWER EXTREMITY Common Femoral Vein: No evidence of thrombus. Normal compressibility, respiratory phasicity and response to augmentation. Saphenofemoral Junction: No evidence of thrombus. Normal compressibility and flow on color Doppler imaging. Profunda Femoral Vein: No evidence of thrombus. Normal compressibility and flow on color Doppler imaging. Femoral Vein: No evidence of thrombus. Normal compressibility, respiratory phasicity and response to augmentation. Popliteal Vein: No evidence of thrombus. Normal compressibility, respiratory phasicity and response to augmentation. Calf Veins: No evidence of thrombus. Normal compressibility and flow on color Doppler imaging. IMPRESSION: No evidence of deep venous thrombosis in either lower extremity. Electronically Signed   By: Judie Petit.  Shick M.D.   On: 07/21/2020 16:57   DG Chest Port 1 View  Result Date: 07/29/2020 CLINICAL DATA:  Shortness of breath and cough EXAM: PORTABLE CHEST 1 VIEW COMPARISON:  December 06, 2016 FINDINGS: There is underlying scarring and fibrotic type change. There is ill-defined airspace opacity in each lower lung region as well as to a lesser degree in the upper lobe regions. The heart size and pulmonary vascularity are normal. No adenopathy. No bone lesions IMPRESSION: Suspect multifocal pneumonia superimposed on fibrosis. Atypical organism pneumonia may well be present in this circumstance. Correlation with COVID-19 status advised. Heart size normal.  No adenopathy appreciable. Electronically Signed   By: Bretta Bang III M.D.   On: 07/16/2020 09:01    DVT  Prophylaxis -SCD  /lovenox AM Labs Ordered, also please review Full Orders  Family Communication: Admission, patients condition and plan of care including tests being ordered have been discussed with the patient and step-daughter* who indicate understanding and agree with the plan   Code Status - Full Code  Likely DC to home when hypoxia and respiratory distress improves  Condition   stable  Shon Hale M.D on 06/30/2020 at 5:58 PM Go to [email or web address] -  for contact info  [facility] - Office  [phone]

## 2020-07-28 NOTE — Progress Notes (Signed)
Pharmacy Antibiotic Note  Bradley [name] is a 59 y.o. male admitted on August 24, 2020 with sepsis.  Pharmacy has been consulted for cefepime and vancomycin dosing.  Received ceftriaxone/azithromycin in ED for CAP coverage.    Plan: Vancomycin 2000 mg IV x 1, then 1000 mg IV every 8 hours Cefepime 2g IV every 8 hours Add MRSA PCR Monitor renal function, Cx/PCR and clinical progression to narrow Vancomycin trough at steady state  Height: 5\' 9"  (175.3 cm) Weight: 88.9 kg (196 lb) IBW/kg (Calculated) : 70.7  Temp (24hrs), Avg:98.8 F (37.1 C), Min:98.8 F (37.1 C), Max:98.8 F (37.1 C)  Recent Labs  Lab 08-24-20 0840  WBC 11.3*  CREATININE 0.86    Estimated Creatinine Clearance: 102 mL/min (by C-G formula based on SCr of 0.86 mg/dL).    Allergies  Allergen Reactions  . Wellbutrin [Bupropion] Cough    07/30/20, PharmD Clinical Pharmacist ED Pharmacist Phone # [phone] 08-24-20 5:52 PM

## 2020-07-28 NOTE — ED Notes (Signed)
Date and time results received: Aug 03, 2020 1935  Test: lactic Critical Value: 2.6  Name of Provider Notified: Jayme Cloud, MD  Orders Received? Or Actions Taken?: acknowledged

## 2020-07-28 NOTE — ED Notes (Signed)
Pt got up to side of bed with stand by assist to change out of wet boxers/pants. Pt had no difficulty with moving. His WOB increased tremendously and O2 sats dropped down into the 70's with HFNC at 15L. Pt was switched to NRB at 15L and after 30 minutes his WOB decreased back to his baseline since being in the ED with O2 sat 91%. Pt was switched back to HFNC at 15L per his request and O2 sat decreased back down to 83% and his WOB started increasing slightly. Pt then transitioned back to NRB at 15L with O2 sat increasing to 91% and WOB back to baseline.

## 2020-07-28 NOTE — ED Notes (Signed)
Pt's o2 sats dropped to 80-85% on nonrebreather at 15lpm. Respiratory called and hospitalist called. Pt placed on bipap and is tolerating well.

## 2020-07-29 ENCOUNTER — Inpatient Hospital Stay (HOSPITAL_COMMUNITY): Payer: Self-pay

## 2020-07-29 DIAGNOSIS — A419 Sepsis, unspecified organism: Secondary | ICD-10-CM | POA: Diagnosis present

## 2020-07-29 DIAGNOSIS — Z01818 Encounter for other preprocedural examination: Secondary | ICD-10-CM

## 2020-07-29 DIAGNOSIS — R0600 Dyspnea, unspecified: Secondary | ICD-10-CM

## 2020-07-29 DIAGNOSIS — I361 Nonrheumatic tricuspid (valve) insufficiency: Secondary | ICD-10-CM

## 2020-07-29 DIAGNOSIS — J189 Pneumonia, unspecified organism: Secondary | ICD-10-CM

## 2020-07-29 DIAGNOSIS — J441 Chronic obstructive pulmonary disease with (acute) exacerbation: Secondary | ICD-10-CM

## 2020-07-29 DIAGNOSIS — J9601 Acute respiratory failure with hypoxia: Secondary | ICD-10-CM

## 2020-07-29 DIAGNOSIS — I371 Nonrheumatic pulmonary valve insufficiency: Secondary | ICD-10-CM

## 2020-07-29 LAB — BLOOD GAS, ARTERIAL
Acid-base deficit: 1.9 mmol/L (ref 0.0–2.0)
Acid-base deficit: 3.8 mmol/L — ABNORMAL HIGH (ref 0.0–2.0)
Bicarbonate: 22.9 mmol/L (ref 20.0–28.0)
Bicarbonate: 18.8 mmol/L — ABNORMAL LOW (ref 20.0–28.0)
FIO2: 100
FIO2: 100
O2 Saturation: 91.3 %
O2 Saturation: 92.5 %
Patient temperature: 36.4
Patient temperature: 37
pCO2 arterial: 35.7 mmHg (ref 32.0–48.0)
pCO2 arterial: 71.1 mmHg (ref 32.0–48.0)
pH, Arterial: 7.407 (ref 7.350–7.450)
pH, Arterial: 7.154 — CL (ref 7.350–7.450)
pO2, Arterial: 61.3 mmHg — ABNORMAL LOW (ref 83.0–108.0)
pO2, Arterial: 85.6 mmHg (ref 83.0–108.0)

## 2020-07-29 LAB — POCT I-STAT 7, (LYTES, BLD GAS, ICA,H+H)
Acid-base deficit: 4 mmol/L — ABNORMAL HIGH (ref 0.0–2.0)
Bicarbonate: 27.3 mmol/L (ref 20.0–28.0)
Calcium, Ion: 1.2 mmol/L (ref 1.15–1.40)
HCT: 43 % (ref 39.0–52.0)
Hemoglobin: 14.6 g/dL (ref 13.0–17.0)
O2 Saturation: 96 %
Patient temperature: 98.6
Potassium: 4.4 mmol/L (ref 3.5–5.1)
Sodium: 142 mmol/L (ref 135–145)
TCO2: 30 mmol/L (ref 22–32)
pCO2 arterial: 82.8 mmHg (ref 32.0–48.0)
pH, Arterial: 7.126 — CL (ref 7.350–7.450)
pO2, Arterial: 111 mmHg — ABNORMAL HIGH (ref 83.0–108.0)

## 2020-07-29 LAB — BASIC METABOLIC PANEL
Anion gap: 10 (ref 5–15)
BUN: 19 mg/dL (ref 6–20)
CO2: 25 mmol/L (ref 22–32)
Calcium: 8.7 mg/dL — ABNORMAL LOW (ref 8.9–10.3)
Chloride: 104 mmol/L (ref 98–111)
Creatinine, Ser: 0.85 mg/dL (ref 0.61–1.24)
GFR calc Af Amer: >60 mL/min (ref >60)
GFR calc non Af Amer: >60 mL/min (ref >60)
Glucose, Bld: 154 mg/dL — ABNORMAL HIGH (ref 70–99)
Potassium: 4.2 mmol/L (ref 3.5–5.1)
Sodium: 139 mmol/L (ref 135–145)

## 2020-07-29 LAB — CBG MONITORING, ED
Glucose-Capillary: 133 mg/dL — ABNORMAL HIGH (ref 70–99)
Glucose-Capillary: 160 mg/dL — ABNORMAL HIGH (ref 70–99)
Glucose-Capillary: 147 mg/dL — ABNORMAL HIGH (ref 70–99)
Glucose-Capillary: 187 mg/dL — ABNORMAL HIGH (ref 70–99)

## 2020-07-29 LAB — ECHOCARDIOGRAM COMPLETE
Area-P 1/2: 6.65 cm2
Height: 69 in
S' Lateral: 2.91 cm
Weight: 3136 oz

## 2020-07-29 LAB — CBC
HCT: 48.4 % (ref 39.0–52.0)
Hemoglobin: 15.6 g/dL (ref 13.0–17.0)
MCH: 29.3 pg (ref 26.0–34.0)
MCHC: 32.2 g/dL (ref 30.0–36.0)
MCV: 90.8 fL (ref 80.0–100.0)
Platelets: 301 10*3/uL (ref 150–400)
RBC: 5.33 MIL/uL (ref 4.22–5.81)
RDW: 14.2 % (ref 11.5–15.5)
WBC: 20 10*3/uL — ABNORMAL HIGH (ref 4.0–10.5)
nRBC: 0 % (ref 0.0–0.2)

## 2020-07-29 LAB — GLUCOSE, CAPILLARY
Glucose-Capillary: 199 mg/dL — ABNORMAL HIGH (ref 70–99)
Glucose-Capillary: 199 mg/dL — ABNORMAL HIGH (ref 70–99)

## 2020-07-29 LAB — HIV ANTIBODY (ROUTINE TESTING W REFLEX): HIV Screen 4th Generation wRfx: NONREACTIVE

## 2020-07-29 MED ORDER — CHLORHEXIDINE GLUCONATE CLOTH 2 % EX PADS
6.0000 | MEDICATED_PAD | Freq: Every day | CUTANEOUS | Status: DC
  Administered 2020-07-30 – 2020-08-08 (×10): 6 via TOPICAL

## 2020-07-29 MED ORDER — CHLORHEXIDINE GLUCONATE 0.12% ORAL RINSE (MEDLINE KIT)
15.0000 mL | Freq: Two times a day (BID) | OROMUCOSAL | Status: DC
  Administered 2020-07-29 – 2020-08-09 (×22): 15 mL via OROMUCOSAL

## 2020-07-29 MED ORDER — DOCUSATE SODIUM 50 MG/5ML PO LIQD
100.0000 mg | Freq: Two times a day (BID) | ORAL | Status: DC
  Administered 2020-07-29 – 2020-08-08 (×13): 100 mg
  Filled 2020-07-29 (×17): qty 10

## 2020-07-29 MED ORDER — ETOMIDATE 2 MG/ML IV SOLN
20.0000 mg | Freq: Once | INTRAVENOUS | Status: AC
  Administered 2020-07-29: 20 mg via INTRAVENOUS
  Filled 2020-07-29: qty 10

## 2020-07-29 MED ORDER — FENTANYL BOLUS VIA INFUSION
50.0000 ug | INTRAVENOUS | Status: DC | PRN
  Administered 2020-07-29 – 2020-07-31 (×9): 50 ug via INTRAVENOUS
  Administered 2020-07-31: 100 ug via INTRAVENOUS
  Administered 2020-07-31 – 2020-08-08 (×45): 50 ug via INTRAVENOUS
  Filled 2020-07-29: qty 50

## 2020-07-29 MED ORDER — ARFORMOTEROL TARTRATE 15 MCG/2ML IN NEBU
15.0000 ug | INHALATION_SOLUTION | Freq: Two times a day (BID) | RESPIRATORY_TRACT | Status: DC
  Administered 2020-07-29 – 2020-08-08 (×21): 15 ug via RESPIRATORY_TRACT
  Filled 2020-07-29 (×23): qty 2

## 2020-07-29 MED ORDER — SODIUM CHLORIDE 0.9 % IV BOLUS
500.0000 mL | Freq: Once | INTRAVENOUS | Status: AC
  Administered 2020-07-29: 500 mL via INTRAVENOUS

## 2020-07-29 MED ORDER — FENTANYL CITRATE (PF) 100 MCG/2ML IJ SOLN
INTRAMUSCULAR | Status: AC
Start: 2020-07-29 — End: 2020-07-30
  Filled 2020-07-29: qty 2

## 2020-07-29 MED ORDER — CISATRACURIUM BESYLATE 20 MG/10ML IV SOLN
0.1000 mg/kg | Freq: Once | INTRAVENOUS | Status: AC
  Administered 2020-07-29: 8.8 mg via INTRAVENOUS
  Filled 2020-07-29: qty 10

## 2020-07-29 MED ORDER — PROPOFOL 1000 MG/100ML IV EMUL
0.0000 ug/kg/min | INTRAVENOUS | Status: DC
  Administered 2020-07-29: 45 ug/kg/min via INTRAVENOUS
  Administered 2020-07-29: 5 ug/kg/min via INTRAVENOUS
  Administered 2020-07-30: 40 ug/kg/min via INTRAVENOUS
  Administered 2020-07-30: 35 ug/kg/min via INTRAVENOUS
  Administered 2020-07-30 (×2): 40 ug/kg/min via INTRAVENOUS
  Administered 2020-07-30: 30 ug/kg/min via INTRAVENOUS
  Administered 2020-07-30: 40 ug/kg/min via INTRAVENOUS
  Administered 2020-07-31: 50 ug/kg/min via INTRAVENOUS
  Administered 2020-07-31: 40 ug/kg/min via INTRAVENOUS
  Filled 2020-07-29 (×11): qty 100

## 2020-07-29 MED ORDER — HYDROCOD POLST-CPM POLST ER 10-8 MG/5ML PO SUER: 5.0000 mL | Freq: Two times a day (BID) | ORAL | Status: DC | PRN

## 2020-07-29 MED ORDER — VECURONIUM BROMIDE 10 MG IV SOLR
10.0000 mg | Freq: Once | INTRAVENOUS | Status: AC
  Administered 2020-07-29: 10 mg via INTRAVENOUS
  Filled 2020-07-29: qty 10

## 2020-07-29 MED ORDER — FENTANYL 2500MCG IN NS 250ML (10MCG/ML) PREMIX INFUSION
50.0000 ug/h | INTRAVENOUS | Status: DC
  Administered 2020-07-30: 100 ug/h via INTRAVENOUS
  Administered 2020-07-30: 50 ug/h via INTRAVENOUS
  Administered 2020-07-31 – 2020-08-01 (×3): 200 ug/h via INTRAVENOUS
  Administered 2020-08-02: 75 ug/h via INTRAVENOUS
  Administered 2020-08-03: 100 ug/h via INTRAVENOUS
  Administered 2020-08-03: 75 ug/h via INTRAVENOUS
  Administered 2020-08-04 – 2020-08-05 (×2): 100 ug/h via INTRAVENOUS
  Administered 2020-08-06: 300 ug/h via INTRAVENOUS
  Administered 2020-08-06: 200 ug/h via INTRAVENOUS
  Administered 2020-08-06: 225 ug/h via INTRAVENOUS
  Administered 2020-08-07 – 2020-08-09 (×6): 300 ug/h via INTRAVENOUS
  Filled 2020-07-29 (×21): qty 250

## 2020-07-29 MED ORDER — STERILE WATER FOR INJECTION IJ SOLN
INTRAMUSCULAR | Status: AC
  Filled 2020-07-29: qty 10

## 2020-07-29 MED ORDER — BUDESONIDE 0.5 MG/2ML IN SUSP
0.5000 mg | Freq: Two times a day (BID) | RESPIRATORY_TRACT | Status: DC
  Administered 2020-07-29 – 2020-08-08 (×22): 0.5 mg via RESPIRATORY_TRACT
  Filled 2020-07-29 (×22): qty 2

## 2020-07-29 MED ORDER — FENTANYL CITRATE (PF) 100 MCG/2ML IJ SOLN
50.0000 ug | Freq: Once | INTRAMUSCULAR | Status: DC
  Filled 2020-07-29 (×2): qty 2

## 2020-07-29 MED ORDER — ORAL CARE MOUTH RINSE
15.0000 mL | OROMUCOSAL | Status: DC
  Administered 2020-07-30 – 2020-08-09 (×104): 15 mL via OROMUCOSAL

## 2020-07-29 MED ORDER — LORAZEPAM 2 MG/ML IJ SOLN: 0.5000 mg | Freq: Three times a day (TID) | INTRAMUSCULAR | Status: DC | PRN

## 2020-07-29 MED ORDER — SUCCINYLCHOLINE CHLORIDE 20 MG/ML IJ SOLN
1.5000 mg/kg | Freq: Once | INTRAMUSCULAR | Status: AC
  Administered 2020-07-29: 133.4 mg via INTRAVENOUS
  Filled 2020-07-29: qty 1

## 2020-07-29 NOTE — ED Provider Notes (Signed)
5:15 PM-the patient was hospitalized yesterday for Covid infection.  He has been on BiPAP this morning and has been decompensating, with marginal PO2, and high-volume oxygen treatment.  I have discussed the case with the critical care doctor, Dr. Vassie Loll.  He requests that I proceed with intubation to support the airway.  He will be involved with ongoing management including vent orders and sedation orders.  At this time the patient is talking on the phone with his family member, prior to intubation.  Medical record reviewed.  Patient has chronic respiratory disorder, with history of acute respiratory failure.  Only drug allergy is Wellbutrin.  Date/Time: 07/29/2020 6:18 PM Performed by: Mancel Bale, MD Pre-anesthesia Checklist: Patient identified, Timeout performed, Emergency Drugs available, Suction available and Patient being monitored Preoxygenation: Pre-oxygenation with 100% oxygen Induction Type: Rapid sequence Grade View: Grade III Tube type: Subglottic suction tube Number of attempts: 1 Placement Confirmation: ETT inserted through vocal cords under direct vision and Positive ETCO2 Secured at: 24 cm Tube secured with: ETT holder Dental Injury: Teeth and Oropharynx as per pre-operative assessment  Comments: Patient intubated, from BiPAP oxygenation device.  Brief period of time off BiPAP, required for single attempt to intubate.  Initial oxygen saturation, 49%, following intubation.  Oxygen saturation gradually improved to greater than 80% over about 4 minutes time.  Ventilator adjustment, PEEP, 18, respiratory rate 28.  Propofol drip begun.  Postintubation chest x-ray ordered to assess for tube placement location.      Post intubation chest x-ray shows ET tube appropriately placed in the trachea, with worsening airspace disease left greater than right.  Patient is slated for transfer for higher level of care.  He is currently being managed by the pulmonary critical care service.  He is  getting sedation with propofol and intermittent boluses of fentanyl.  He is apparently not a candidate for proning, while in the emergency department.   Mancel Bale, MD 07/29/20 [phone]

## 2020-07-29 NOTE — Progress Notes (Addendum)
PROGRESS NOTE    Bradley [name]  GEZ:[id number] DOB: 1961-08-17 DOA: 08-07-2020 PCP: Georgann Housekeeper, MD    Chief Complaint  Patient presents with  . Shortness of Breath    Brief Narrative:   Bradley [name]  is a 59 y.o. male reformed smoker with past medical history relevant for COPD/emphysema, pulmonary fibrosis and anxiety disorder who presents to the ED with worsening shortness of breath for the last several days --- Productive cough and worsening respiratory status apparently started a couple weeks ago -He was treated empirically as outpatient with antibiotics by his PCP -Had a negative Covid test couple weeks ago -Symptoms worsened -EMS reports O2 sats was down to 70% when they arrived-  --Here in the ED -- Pt got up to side of bed with stand by assist to change out of wet boxers/pants. Pt had no difficulty with moving. His WOB increased tremendously and O2 sats dropped down into the 70's with HFNC at 15L. Pt was switched to NRB at 15L and after 30 minutes his WOB decreased back to his baseline since being in the ED with O2 sat 91%. Pt was switched back to HFNC at 15L per his request and O2 sat decreased back down to 83% and his WOB started increasing slightly. Pt then transitioned back to NRB at 15L with O2 sat increasing to 91% and WOB back to baseline.  -Chest x-ray suggestive of multifocal pneumonia superimposed on underlying COPD and pulmonary fibrosis -Patient received Rocephin and azithromycin in the ED ---Lactic acid ordered and pending -Patient was vaccinated against COVID-19 infection (first dose 04/20/2020 secondary 05/11/2020) -COVID-19 test x2 - today -Troponin not elevated, BNP 157  Assessment & Plan: 1-severe sepsis --due to multifocal pneumonia: present on admission. -Will follow blood culture and respiratory culture results -Continue current IV antibiotic therapy -Continue bronchodilators and nebulizer management -Wean oxygen supplementation as  tolerated -Continue flutter valve and the steroids at this time -Pulmonology service has been consulted. -Patient remains at high risk for requiring intubation.  2-Pulmonary fibrosis/Not UIP pattern -Continue steroids, bronchodilators and nebulizer management. -Pulmonology service has been consulted -Follow recommendations.  3-Acute respiratory failure with hypoxia (HCC) -Secondary to multifocal pneumonia and underlying exacerbation of COPD/pulmonary fibrosis -Continue treatment with the steroids and antibiotics -Wean oxygen supplementation as tolerated -Patient no use oxygen at baseline.  4-Diabetes mellitus type 2 without retinopathy (HCC) -Continue sliding scale insulin  5-GERD/GI prophylaxis -continue PPI  6-COPD GOLD II/centrilobular and paraseptal emphysema -continue steroids, bronchodilators and nebulizer management -not using oxygen at baseline   7-depression/insomnia -mood stable -continue cymbalta and PRN trazodone    DVT prophylaxis: Lovenox. Code Status: Full code. Family Communication: No family at bedside.  Patient's daughter updated over the phone. Disposition:   Status is: Inpatient  Dispo: The patient is from: Home              Anticipated d/c is to: To be determined              Anticipated d/c date is: To be determined              Patient currently is not medically ready for discharge; still with ongoing acute respiratory distress, experiencing significant hypoxia and tachypnea currently on BiPAP.  Continue IV antibiotics for underlying multifocal pneumonia and follow pulmonology service for further assistance and management recommendations for his underlying COPD and pulmonary fibrosis.    Consultants:   Pulmonology service   Procedures:  See below for x-ray reports.   Antimicrobials:  Vancomycin,  cefepime and Zithromax.   Subjective: In mild respiratory distress; with ongoing hypoxia and feeling short of breath.  Patient currently using  BiPAP in order to maintain O2 sats.  Denies chest pain, no nausea, no vomiting.  Currently afebrile.  Objective: Vitals:   07/29/20 0825 07/29/20 0828 07/29/20 0829 07/29/20 0832  BP:      Pulse:   (!) 109   Resp:      Temp:      TempSrc:      SpO2: 92% 93% 95% 95%  Weight:      Height:        Intake/Output Summary (Last 24 hours) at 07/29/2020 1325 Last data filed at 07/29/2020 0058 Gross per 24 hour  Intake --  Output 600 ml  Net -600 ml   Filed Weights   07/07/2020 0816  Weight: 88.9 kg    Examination:  General exam: In mild respiratory distress while wearing BiPAP; still tachypneic and with mild use of accessory muscles.  Expressed feeling short of breath with minimal exertion.  Currently afebrile.  No chest pain Respiratory system: Diffuse rhonchi bilaterally; mild expiratory wheezing. Cardiovascular system: S1 & S2 heard, RRR. No JVD, murmurs, rubs, gallops or clicks. No pedal edema. Gastrointestinal system: Abdomen is nondistended, soft and nontender. No organomegaly or masses felt. Normal bowel sounds heard. Central nervous system: Alert and oriented. No focal neurological deficits. Extremities: No cyanosis or clubbing. Skin: No rashes, no petechiae. Psychiatry: Judgement and insight appear normal. Mood & affect appropriate.     Data Reviewed: I have personally reviewed following labs and imaging studies  CBC: Recent Labs  Lab 07/14/2020 0840 07/29/20 0801  WBC 11.3* 20.0*  NEUTROABS 10.1*  --   HGB 14.9 15.6  HCT 45.6 48.4  MCV 90.5 90.8  PLT 225 301    Basic Metabolic Panel: Recent Labs  Lab 07/23/2020 0840 07/29/20 0801  NA 136 139  K 3.3* 4.2  CL 101 104  CO2 22 25  GLUCOSE 159* 154*  BUN 10 19  CREATININE 0.86 0.85  CALCIUM 8.7* 8.7*    GFR: Estimated Creatinine Clearance: 103.2 mL/min (by C-G formula based on SCr of 0.85 mg/dL).  Liver Function Tests: Recent Labs  Lab 07/14/2020 0840  AST 20  ALT 15  ALKPHOS 53  BILITOT 1.0  PROT  7.4  ALBUMIN 3.3*    CBG: Recent Labs  Lab 07/12/2020 1232 07/29/2020 1736 07/08/2020 2106 07/29/20 0833 07/29/20 1153  GLUCAP 165* 163* 144* 133* 160*     Recent Results (from the past 240 hour(s))  Respiratory Panel by RT PCR (Flu A&B, Covid) - Nasopharyngeal Swab     Status: None   Collection Time: 07/25/2020  8:40 AM   Specimen: Nasopharyngeal Swab  Result Value Ref Range Status   SARS Coronavirus 2 by RT PCR NEGATIVE NEGATIVE Final    Comment: (NOTE) SARS-CoV-2 target nucleic acids are NOT DETECTED.  The SARS-CoV-2 RNA is generally detectable in upper respiratoy specimens during the acute phase of infection. The lowest concentration of SARS-CoV-2 viral copies this assay can detect is 131 copies/mL. A negative result does not preclude SARS-Cov-2 infection and should not be used as the sole basis for treatment or other patient management decisions. A negative result may occur with  improper specimen collection/handling, submission of specimen other than nasopharyngeal swab, presence of viral mutation(s) within the areas targeted by this assay, and inadequate number of viral copies (<131 copies/mL). A negative result must be combined with clinical observations, patient history,  and epidemiological information. The expected result is Negative.  Fact Sheet for Patients:  https://www.moore.com/  Fact Sheet for Healthcare Providers:  https://www.young.biz/  This test is no t yet approved or cleared by the [place] FDA and  has been authorized for detection and/or diagnosis of SARS-CoV-2 by FDA under an Emergency Use Authorization (EUA). This EUA will remain  in effect (meaning this test can be used) for the duration of the COVID-19 declaration under Section 564(b)(1) of the Act, 21 U.S.C. section 360bbb-3(b)(1), unless the authorization is terminated or revoked sooner.     Influenza A by PCR NEGATIVE NEGATIVE Final   Influenza B  by PCR NEGATIVE NEGATIVE Final    Comment: (NOTE) The Xpert Xpress SARS-CoV-2/FLU/RSV assay is intended as an aid in  the diagnosis of influenza from Nasopharyngeal swab specimens and  should not be used as a sole basis for treatment. Nasal washings and  aspirates are unacceptable for Xpert Xpress SARS-CoV-2/FLU/RSV  testing.  Fact Sheet for Patients: https://www.moore.com/  Fact Sheet for Healthcare Providers: https://www.young.biz/  This test is not yet approved or cleared by the [place] FDA and  has been authorized for detection and/or diagnosis of SARS-CoV-2 by  FDA under an Emergency Use Authorization (EUA). This EUA will remain  in effect (meaning this test can be used) for the duration of the  Covid-19 declaration under Section 564(b)(1) of the Act, 21  U.S.C. section 360bbb-3(b)(1), unless the authorization is  terminated or revoked. Performed at [facility], [address], [place], [place] [address]   SARS Coronavirus 2 by RT PCR (hospital order, performed in [facility] hospital lab) Nasopharyngeal Nasopharyngeal Swab     Status: None   Collection Time: Aug 23, 2020  2:40 PM   Specimen: Nasopharyngeal Swab  Result Value Ref Range Status   SARS Coronavirus 2 NEGATIVE NEGATIVE Final    Comment: (NOTE) SARS-CoV-2 target nucleic acids are NOT DETECTED.  The SARS-CoV-2 RNA is generally detectable in upper and lower respiratory specimens during the acute phase of infection. The lowest concentration of SARS-CoV-2 viral copies this assay can detect is 250 copies / mL. A negative result does not preclude SARS-CoV-2 infection and should not be used as the sole basis for treatment or other patient management decisions.  A negative result may occur with improper specimen collection / handling, submission of specimen other than nasopharyngeal swab, presence of viral mutation(s) within the areas targeted by this assay, and inadequate number  of viral copies (<250 copies / mL). A negative result must be combined with clinical observations, patient history, and epidemiological information.  Fact Sheet for Patients:   BoilerBrush.com.cy  Fact Sheet for Healthcare Providers: https://pope.com/  This test is not yet approved or  cleared by the [place] FDA and has been authorized for detection and/or diagnosis of SARS-CoV-2 by FDA under an Emergency Use Authorization (EUA).  This EUA will remain in effect (meaning this test can be used) for the duration of the COVID-19 declaration under Section 564(b)(1) of the Act, 21 U.S.C. section 360bbb-3(b)(1), unless the authorization is terminated or revoked sooner.  Performed at [facility], [address], [place], [place] [address]   Culture, blood (Routine X 2) w Reflex to ID Panel     Status: None (Preliminary result)   Collection Time: 2020-08-23  6:20 PM   Specimen: BLOOD  Result Value Ref Range Status   Specimen Description BLOOD LEFT ANTECUBITAL  Final   Special Requests   Final    BOTTLES DRAWN AEROBIC  AND ANAEROBIC Blood Culture results may not be optimal due to an inadequate volume of blood received in culture bottles   Culture   Final    NO GROWTH < 12 HOURS Performed at [facility], [address], [place], [place] [address]    Report Status PENDING  Incomplete  Culture, blood (Routine X 2) w Reflex to ID Panel     Status: None (Preliminary result)   Collection Time: 07/08/2020  6:20 PM   Specimen: BLOOD LEFT HAND  Result Value Ref Range Status   Specimen Description BLOOD LEFT HAND  Final   Special Requests   Final    BOTTLES DRAWN AEROBIC AND ANAEROBIC Blood Culture results may not be optimal due to an inadequate volume of blood received in culture bottles   Culture   Final    NO GROWTH < 12 HOURS Performed at [facility], [address] [place], [place] [address]    Report Status PENDING  Incomplete      Radiology Studies: US Venous Img Lower Bilateral (DVT)  Result Date: 07/10/2020 CLINICAL DATA:  Pulmonary emboli EXAM: BILATERAL LOWER EXTREMITY VENOUS DOPPLER ULTRASOUND TECHNIQUE: Gray-scale sonography with graded compression, as well as color Doppler and duplex ultrasound were performed to evaluate the lower extremity deep venous systems from the level of the common femoral vein and including the common femoral, femoral, profunda femoral, popliteal and calf veins including the posterior tibial, peroneal and gastrocnemius veins when visible. The superficial great saphenous vein was also interrogated. Spectral Doppler was utilized to evaluate flow at rest and with distal augmentation maneuvers in the common femoral, femoral and popliteal veins. COMPARISON:  None. FINDINGS: RIGHT LOWER EXTREMITY Common Femoral Vein: No evidence of thrombus. Normal compressibility, respiratory phasicity and response to augmentation. Saphenofemoral Junction: No evidence of thrombus. Normal compressibility and flow on color Doppler imaging. Profunda Femoral Vein: No evidence of thrombus. Normal compressibility and flow on color Doppler imaging. Femoral Vein: No evidence of thrombus. Normal compressibility, respiratory phasicity and response to augmentation. Popliteal Vein: No evidence of thrombus. Normal compressibility, respiratory phasicity and response to augmentation. Calf Veins: No evidence of thrombus. Normal compressibility and flow on color Doppler imaging. LEFT LOWER EXTREMITY Common Femoral Vein: No evidence of thrombus. Normal compressibility, respiratory phasicity and response to augmentation. Saphenofemoral Junction: No evidence of thrombus. Normal compressibility and flow on color Doppler imaging. Profunda Femoral Vein: No evidence of thrombus. Normal compressibility and flow on color Doppler imaging. Femoral Vein: No evidence of thrombus. Normal compressibility, respiratory phasicity and response to augmentation.  Popliteal Vein: No evidence of thrombus. Normal compressibility, respiratory phasicity and response to augmentation. Calf Veins: No evidence of thrombus. Normal compressibility and flow on color Doppler imaging. IMPRESSION: No evidence of deep venous thrombosis in either lower extremity. Electronically Signed   By: Judie Petit.  Shick M.D.   On: 07/03/2020 16:57   DG Chest Port 1 View  Result Date: 07/08/2020 CLINICAL DATA:  Shortness of breath and cough EXAM: PORTABLE CHEST 1 VIEW COMPARISON:  December 06, 2016 FINDINGS: There is underlying scarring and fibrotic type change. There is ill-defined airspace opacity in each lower lung region as well as to a lesser degree in the upper lobe regions. The heart size and pulmonary vascularity are normal. No adenopathy. No bone lesions IMPRESSION: Suspect multifocal pneumonia superimposed on fibrosis. Atypical organism pneumonia may well be present in this circumstance. Correlation with COVID-19 status advised. Heart size normal.  No adenopathy appreciable. Electronically Signed   By: Bretta Bang III M.D.  On: 07/21/2020 09:01   ECHOCARDIOGRAM COMPLETE  Result Date: 07/29/2020    ECHOCARDIOGRAM REPORT   Patient Name:   Bradley [name] Date of Exam: 07/29/2020 Medical Rec #:  [id number]      Height:       69.0 in Accession #:    [id number]     Weight:       196.0 lb Date of Birth:  10-31-1960      BSA:          2.048 m Patient Age:    59 years       BP:           115/70 mmHg Patient Gender: M              HR:           110 bpm. [facility] Procedure: 2D Echo, Cardiac Doppler and Color Doppler Indications:    Dyspnea  History:        Patient has prior history of Echocardiogram examinations, most                 recent 08/19/2016. COPD; Risk Factors:Diabetes and Obesity.                 Sepsis, pneumonia, pul.fibrosis, sinus tachycardia.  Sonographer:    Lavenia Atlas RDCS Referring Phys: [phone] [facility]  Sonographer Comments: Technically difficult  study due to poor echo windows and patient is morbidly obese. Image acquisition challenging due to COPD, Image acquisition challenging due to respiratory motion and patient on Bipap and sitting up. IMPRESSIONS  1. Left ventricular ejection fraction, by estimation, is approximately 55%. The left ventricle has normal function. Left ventricular endocardial border not optimally defined to evaluate regional wall motion. There is mild left ventricular hypertrophy. Left ventricular diastolic parameters are indeterminate. There is the interventricular septum is flattened in systole and diastole, consistent with right ventricular pressure and volume overload.  2. RV-RA gradient is severely elevated at 68 mmHg consistent with pulmonary hypertension. Right ventricular systolic function is moderately to severely reduced. The right ventricular size is mildly enlarged.  3. The mitral valve is grossly normal. No evidence of mitral valve regurgitation.  4. The aortic valve is tricuspid. Aortic valve regurgitation is not visualized.  5. Unable to estimate CVP. FINDINGS  Left Ventricle: Left ventricular ejection fraction, by estimation, is 55%. The left ventricle has normal function. Left ventricular endocardial border not optimally defined to evaluate regional wall motion. The left ventricular internal cavity size was normal in size. There is mild left ventricular hypertrophy. The interventricular septum is flattened in systole and diastole, consistent with right ventricular pressure and volume overload. Left ventricular diastolic parameters are indeterminate. Right Ventricle: RV-RA gradient is severely elevated at 68 mmHg consistent with pulmonary hypertension. The right ventricular size is mildly enlarged. No increase in right ventricular wall thickness. Right ventricular systolic function is severely reduced. Left Atrium: Left atrial size was normal in size. Right Atrium: Right atrial size was normal in size. Pericardium: The  pericardium was not assessed. Mitral Valve: The mitral valve is grossly normal. There is mild calcification of the mitral valve leaflet(s). No evidence of mitral valve regurgitation. Tricuspid Valve: The tricuspid valve is grossly normal. Tricuspid valve regurgitation is mild. Aortic Valve: The aortic valve is tricuspid. Aortic valve regurgitation is not visualized. Pulmonic Valve: The pulmonic valve was not well visualized. Pulmonic valve regurgitation is mild. Aorta: The aortic root is normal in size and structure. Venous:  Unable to estimate CVP. The inferior vena cava was not well visualized. IAS/Shunts: The interatrial septum was not well visualized.  LEFT VENTRICLE PLAX 2D LVIDd:         3.83 cm  Diastology LVIDs:         2.91 cm  LV e' medial:    9.68 cm/s LV PW:         1.23 cm  LV E/e' medial:  6.6 LV IVS:        1.12 cm  LV e' lateral:   10.30 cm/s LVOT diam:     2.30 cm  LV E/e' lateral: 6.2 LV SV:         41 LV SV Index:   20 LVOT Area:     4.15 cm  RIGHT VENTRICLE RV Basal diam:  3.91 cm RV S prime:     6.74 cm/s TAPSE (M-mode): 2.0 cm LEFT ATRIUM           Index       RIGHT ATRIUM           Index LA diam:      3.40 cm 1.66 cm/m  RA Area:     17.40 cm LA Vol (A4C): 29.4 ml 14.35 ml/m RA Volume:   49.10 ml  23.97 ml/m  AORTIC VALVE LVOT Vmax:   60.70 cm/s LVOT Vmean:  32.800 cm/s LVOT VTI:    0.099 m  AORTA Ao Root diam: 3.20 cm MITRAL VALVE               TRICUSPID VALVE MV Area (PHT): 6.65 cm    TR Peak grad:   68.9 mmHg MV Decel Time: 114 msec    TR Vmax:        415.00 cm/s MV E velocity: 64.30 cm/s MV A velocity: 39.00 cm/s  SHUNTS MV E/A ratio:  1.65        Systemic VTI:  0.10 m                            Systemic Diam: 2.30 cm Nona DellSamuel Mcdowell MD Electronically signed by Nona DellSamuel Mcdowell MD Signature Date/Time: 07/29/2020/11:17:11 AM    Final     Scheduled Meds: . arformoterol  15 mcg Nebulization BID  . bisoprolol  5 mg Oral Daily  . budesonide (PULMICORT) nebulizer solution  0.5 mg  Nebulization BID  . DULoxetine  60 mg Oral Daily  . enoxaparin (LOVENOX) injection  1 mg/kg Subcutaneous Q12H  . guaiFENesin  600 mg Oral BID  . insulin aspart  0-5 Units Subcutaneous QHS  . insulin aspart  0-6 Units Subcutaneous TID WC  . ipratropium-albuterol  3 mL Nebulization Q6H  . methylPREDNISolone (SOLU-MEDROL) injection  40 mg Intravenous Q8H  . pantoprazole  40 mg Oral QAC breakfast  . polyethylene glycol  17 g Oral Daily  . sodium chloride flush  3 mL Intravenous Q12H   Continuous Infusions: . sodium chloride    . azithromycin 500 mg (07/29/20 0859)  . ceFEPime (MAXIPIME) IV 2 g (07/29/20 [id number])  . vancomycin 1,000 mg (07/29/20 1135)     LOS: 1 day    Time spent: 35 minutes.    Vassie Lollarlos Ishani Goldwasser, MD [facility]   To contact the attending provider between 7A-7P or the covering provider during after hours 7P-7A, please log into the web site [email or web address] and access using universal [place] password for that web site. If you do not have the password,  please call the hospital operator.  07/29/2020, 1:25 PM

## 2020-07-29 NOTE — ED Notes (Signed)
Daughter at bedside.

## 2020-07-29 NOTE — Progress Notes (Addendum)
Patient respiratory status continued to be actively declining, using accessory muscles, appearing to get entire and despite 100% oxygen saturation on BiPAP his ABG demonstrated hypoxia with O2 in the 60% range.  After discussion with pulmonology service decision was made to intubate patient and provide ventilatory support.  Patient will be admitted to ICU and further decisions will be made based on clinical response. Appreciate assistance by EDP (Dr. Effie Shy) who helped [place] with intubation process.  Patient requiring high PEEP and with anticipated long intubation needs (base on prior hx and underlying lung disease); will transfer to ICU PCCM service at [facility] as per Dr. Vassie Loll request.  Vassie Loll MD [phone]

## 2020-07-29 NOTE — ED Notes (Signed)
ED Provider at bedside. 

## 2020-07-29 NOTE — ED Notes (Signed)
1345 Pt began to decompensate O2 70-80's on Bipap Pulmonology, Hospitalist and respiratory aware.  Pt breathing 25-35 breaths per minute.  1345 Pt request additional medication (Mucinex DM for cough and as a expectorant).  Provider aware.    1613 Pt decompensating further.  Respiratory and Hospitalist notified,  Dr. Gwenlyn Perking ordering anxiety medication and something for cough.  Pt breathing 30-40 times a minute sats 70-80.  Accessory muscles being used pt very tired and showing more signs of distress.  Provider giving orders.

## 2020-07-29 NOTE — Progress Notes (Signed)
AT 1405 RT attempted to placed patient on Heated High Flow nasal cannula 100% FIO2 and 60L. Patient unable to tolerate Sats dropped to 74% and HR 115. RT placed patient back on BIPAP 12/8 and 100% FIO2. RT will continue to monitor and assess.Patient HR 106, RR 32 and SATs 92%

## 2020-07-29 NOTE — ED Notes (Signed)
Assumed care of pt at 0700.  Pt assisted to bedside commode.

## 2020-07-29 NOTE — ED Notes (Signed)
Pt moved to heated high flow O2.

## 2020-07-29 NOTE — Progress Notes (Signed)
  Echocardiogram 2D Echocardiogram has been performed.  Bradley [name] 07/29/2020, 10:04 AM

## 2020-07-29 NOTE — ED Notes (Signed)
Pt desat to 60's upon exertion.  Respiratory notified.

## 2020-07-29 NOTE — Consult Note (Signed)
Name: Bradley [name] MRN: [id number] DOB: 22-Dec-1960    ADMISSION DATE:  07/16/2020 CONSULTATION DATE:  07/29/2020   REFERRING MD :  Gwenlyn Perking, triad MD  CHIEF COMPLAINT:  resp distress , hypoxiq  BRIEF PATIENT DESCRIPTION: 59 year old ex-smoker admitted with severe acute hypoxic respiratory failure and bilateral infiltrates/community-acquired pneumonia Covid, flu negative  SIGNIFICANT EVENTS  07/2016 ARDS , tstomy  STUDIES:  PFT 11/ 2016 showing isolated reduction in dlco to 59% Spirometry 12/06/2016  FEV1 2.37 (65%)  Ratio 62 ,FVC  3.84 (81%)  CT chest 07/14/15 c/w RBILD or DIP, not UIP CT angio chest 07/2016 >>Diffuse bilateral regional ground-glass airspace opacification, with underlying emphysema and interstitial prominence. This is markedly worsened from 2016, reflecting acute airspace disease superimposed on the patient's fibrotic change.  Enlarged mediastinal nodes, measuring up to 1.9 cm   Venous duplex 9/29 >> negative  Micro Covid negative Flu a and B >> negative   HISTORY OF PRESENT ILLNESS: 59 year old heavy ex-smoker who is seen my partner Dr. Sherene Sires in the past, last 2017 PFTs showing isolated decrease in DLCO-felt to have a combination of emphysema noted on CT and RB ILD/fibrosis.  He was hospitalized 07/2016 for ARDS, no infectious etiology identified, autoimmune serologies were negative as except for isolated increase in RA factor, CCP negative.  He required a tracheostomy but recovered well from this illness and did not need long-term oxygen.  He has been lost to follow-up from the pulmonary clinic since then.  I note a chest x-ray in February 2018 which shows clearing of bilateral infiltrates. He now presents with 3 weeks of coughing up yellow-green sputum, no fevers, preceding URI symptoms, no sick contacts.  PCP gave him antibiotics but his dyspnea worsen until he arrived to the emergency room .  Chest x-ray independently reviewed by me shows multifocal bilateral  infiltrates upper and lower lung zones.  Covid and flu testing was negative, labs showed WBC count of 11.3 with 90% neutrophils, BNP was low, troponins negative He was placed on high flow nasal cannula and then had desaturation on movement and was placed on a BiPAP overnight. PCCM asked to consult  PAST MEDICAL HISTORY :   has a past medical history of Anxiety, Asthma, COPD (chronic obstructive pulmonary disease) (HCC), Diabetes mellitus type 2 without retinopathy (HCC), Dyspnea, History of kidney stones, Pneumonia, and Pulmonary fibrosis (HCC).  has a past surgical history that includes Multiple tooth extractions; Tonsillectomy; Lithotripsy; Colonoscopy w/ polypectomy; and Shoulder arthroscopy with rotator cuff repair (Right, 08/14/2016). Prior to Admission medications   Medication Sig Start Date End Date Taking? Authorizing Provider  albuterol (PROAIR HFA) 108 (90 Base) MCG/ACT inhaler Inhale 2 puffs into the lungs every 6 (six) hours as needed for wheezing or shortness of breath.   Yes [provider]  DULoxetine (CYMBALTA) 60 MG capsule Take 60 mg by mouth daily.   Yes [provider]  ALPRAZolam Prudy Feeler) 1 MG tablet Take 1 tablet (1 mg total) by mouth 3 (three) times daily as needed for anxiety. Patient not taking: Reported on 07/23/2020 10/19/16   Marcello Fennel, MD  bisoprolol (ZEBETA) 5 MG tablet Take 1 tablet (5 mg total) by mouth daily. Patient not taking: Reported on 12/19/2016 12/06/16   Nyoka Cowden, MD  budesonide-formoterol [facility]) 160-4.5 MCG/ACT inhaler Inhale 2 puffs into the lungs 2 (two) times daily. Patient not taking: Reported on 12/19/2016 12/06/16   Nyoka Cowden, MD  famotidine (PEPCID) 20 MG tablet One at bedtime Patient not taking:  Reported on 07/25/2020 12/06/16   Nyoka Cowden, MD  methocarbamol (ROBAXIN) 500 MG tablet Take 1 tablet (500 mg total) by mouth every 6 (six) hours as needed for muscle spasms. Patient not taking: Reported on 07/02/2020  10/13/16   Marcello Fennel, MD  pantoprazole (PROTONIX) 40 MG tablet TAKE 1 TABLET (40 MG TOTAL) BY MOUTH DAILY. TAKE 30-60 MIN BEFORE FIRST MEAL OF THE DAY Patient not taking: Reported on 07/15/2020 03/21/17   Nyoka Cowden, MD  polyethylene glycol [facility] / GLYCOLAX) packet Take 17 g by mouth daily. Patient not taking: Reported on 07/25/2020 09/28/16   Jacquelynn Cree, PA-C   Allergies  Allergen Reactions  . Wellbutrin [Bupropion] Cough    FAMILY HISTORY:  family history includes Colon cancer in his mother; Heart disease in his mother. SOCIAL HISTORY:  reports that he quit smoking about 3 years ago. His smoking use included cigarettes. He has a 30.00 pack-year smoking history. He has never used smokeless tobacco. He reports that he does not drink alcohol and does not use drugs.  REVIEW OF SYSTEMS:   Shortness of breath, cough, green sputum -no hemoptysis or wheezing. No skin, joint issues    Constitutional: Negative for fever, chills, weight loss, malaise/fatigue and diaphoresis.  HENT: Negative for hearing loss, ear pain, nosebleeds, congestion, sore throat, neck pain, tinnitus and ear discharge.   Eyes: Negative for blurred vision, double vision, photophobia, pain, discharge and redness.   Cardiovascular: Negative for chest pain, palpitations, orthopnea, claudication, leg swelling and PND.  Gastrointestinal: Negative for heartburn, nausea, vomiting, abdominal pain, diarrhea, constipation, blood in stool and melena.  Genitourinary: Negative for dysuria, urgency, frequency, hematuria and flank pain.  Musculoskeletal: Negative for myalgias, back pain, joint pain and falls.  Skin: Negative for itching and rash.  Neurological: Negative for dizziness, tingling, tremors, sensory change, speech change, focal weakness, seizures, loss of consciousness, weakness and headaches.  Endo/Heme/Allergies: Negative for environmental allergies and polydipsia. Does not bruise/bleed  easily.  SUBJECTIVE:   VITAL SIGNS: Temp:  [98 F (36.7 C)] 98 F (36.7 C) (09/30 0700) Pulse Rate:  [86-121] 109 (09/30 0829) Resp:  [14-38] 22 (09/30 0700) BP: (99-152)/(69-102) 115/70 (09/30 0700) SpO2:  [78 %-100 %] 95 % (09/30 0832) FiO2 (%):  [90 %-100 %] 90 % (09/30 0832)  PHYSICAL EXAMINATION: Gen:      elderly man, mild distress on BiPAP HEENT:  EOMI, sclera anicteric, mild pallor Neck:     No JVD; no thyromegaly Lungs:    Bilateral scattered  crackles , no rhonchi, accessory muscle use+ CV:         Regular rate and rhythm; no murmurs Abd:      + bowel sounds; soft, non-tender; no palpable masses, no distension Ext:    No edema; adequate peripheral perfusion Skin:      Warm and dry; no rash Neuro: alert and oriented x 3   Recent Labs  Lab 07/19/2020 0840 07/29/20 0801  NA 136 139  K 3.3* 4.2  CL 101 104  CO2 22 25  BUN 10 19  CREATININE 0.86 0.85  GLUCOSE 159* 154*   Recent Labs  Lab 07/17/2020 0840 07/29/20 0801  HGB 14.9 15.6  HCT 45.6 48.4  WBC 11.3* 20.0*  PLT 225 301   US Venous Img Lower Bilateral (DVT)  Result Date: 07/06/2020 CLINICAL DATA:  Pulmonary emboli EXAM: BILATERAL LOWER EXTREMITY VENOUS DOPPLER ULTRASOUND TECHNIQUE: Gray-scale sonography with graded compression, as well as color Doppler and duplex ultrasound were  performed to evaluate the lower extremity deep venous systems from the level of the common femoral vein and including the common femoral, femoral, profunda femoral, popliteal and calf veins including the posterior tibial, peroneal and gastrocnemius veins when visible. The superficial great saphenous vein was also interrogated. Spectral Doppler was utilized to evaluate flow at rest and with distal augmentation maneuvers in the common femoral, femoral and popliteal veins. COMPARISON:  None. FINDINGS: RIGHT LOWER EXTREMITY Common Femoral Vein: No evidence of thrombus. Normal compressibility, respiratory phasicity and response to  augmentation. Saphenofemoral Junction: No evidence of thrombus. Normal compressibility and flow on color Doppler imaging. Profunda Femoral Vein: No evidence of thrombus. Normal compressibility and flow on color Doppler imaging. Femoral Vein: No evidence of thrombus. Normal compressibility, respiratory phasicity and response to augmentation. Popliteal Vein: No evidence of thrombus. Normal compressibility, respiratory phasicity and response to augmentation. Calf Veins: No evidence of thrombus. Normal compressibility and flow on color Doppler imaging. LEFT LOWER EXTREMITY Common Femoral Vein: No evidence of thrombus. Normal compressibility, respiratory phasicity and response to augmentation. Saphenofemoral Junction: No evidence of thrombus. Normal compressibility and flow on color Doppler imaging. Profunda Femoral Vein: No evidence of thrombus. Normal compressibility and flow on color Doppler imaging. Femoral Vein: No evidence of thrombus. Normal compressibility, respiratory phasicity and response to augmentation. Popliteal Vein: No evidence of thrombus. Normal compressibility, respiratory phasicity and response to augmentation. Calf Veins: No evidence of thrombus. Normal compressibility and flow on color Doppler imaging. IMPRESSION: No evidence of deep venous thrombosis in either lower extremity. Electronically Signed   By: Judie Petit.  Shick M.D.   On: 08-05-20 16:57   DG Chest Port 1 View  Result Date: 08-05-20 CLINICAL DATA:  Shortness of breath and cough EXAM: PORTABLE CHEST 1 VIEW COMPARISON:  December 06, 2016 FINDINGS: There is underlying scarring and fibrotic type change. There is ill-defined airspace opacity in each lower lung region as well as to a lesser degree in the upper lobe regions. The heart size and pulmonary vascularity are normal. No adenopathy. No bone lesions IMPRESSION: Suspect multifocal pneumonia superimposed on fibrosis. Atypical organism pneumonia may well be present in this circumstance.  Correlation with COVID-19 status advised. Heart size normal.  No adenopathy appreciable. Electronically Signed   By: Bretta Bang III M.D.   On: August 05, 2020 09:01   ECHOCARDIOGRAM COMPLETE  Result Date: 07/29/2020    ECHOCARDIOGRAM REPORT   Patient Name:   Bradley [name] Date of Exam: 07/29/2020 Medical Rec #:  [id number]      Height:       69.0 in Accession #:    [id number]     Weight:       196.0 lb Date of Birth:  1961/01/27      BSA:          2.048 m Patient Age:    59 years       BP:           115/70 mmHg Patient Gender: M              HR:           110 bpm. [facility] Procedure: 2D Echo, Cardiac Doppler and Color Doppler Indications:    Dyspnea  History:        Patient has prior history of Echocardiogram examinations, most                 recent 08/19/2016. COPD; Risk Factors:Diabetes and Obesity.  Sepsis, pneumonia, pul.fibrosis, sinus tachycardia.  Sonographer:    Lavenia AtlasBrooke Strickland RDCS Referring Phys: [phone] [facility]  Sonographer Comments: Technically difficult study due to poor echo windows and patient is morbidly obese. Image acquisition challenging due to COPD, Image acquisition challenging due to respiratory motion and patient on Bipap and sitting up. IMPRESSIONS  1. Left ventricular ejection fraction, by estimation, is approximately 55%. The left ventricle has normal function. Left ventricular endocardial border not optimally defined to evaluate regional wall motion. There is mild left ventricular hypertrophy. Left ventricular diastolic parameters are indeterminate. There is the interventricular septum is flattened in systole and diastole, consistent with right ventricular pressure and volume overload.  2. RV-RA gradient is severely elevated at 68 mmHg consistent with pulmonary hypertension. Right ventricular systolic function is moderately to severely reduced. The right ventricular size is mildly enlarged.  3. The mitral valve is grossly normal. No evidence  of mitral valve regurgitation.  4. The aortic valve is tricuspid. Aortic valve regurgitation is not visualized.  5. Unable to estimate CVP. FINDINGS  Left Ventricle: Left ventricular ejection fraction, by estimation, is 55%. The left ventricle has normal function. Left ventricular endocardial border not optimally defined to evaluate regional wall motion. The left ventricular internal cavity size was normal in size. There is mild left ventricular hypertrophy. The interventricular septum is flattened in systole and diastole, consistent with right ventricular pressure and volume overload. Left ventricular diastolic parameters are indeterminate. Right Ventricle: RV-RA gradient is severely elevated at 68 mmHg consistent with pulmonary hypertension. The right ventricular size is mildly enlarged. No increase in right ventricular wall thickness. Right ventricular systolic function is severely reduced. Left Atrium: Left atrial size was normal in size. Right Atrium: Right atrial size was normal in size. Pericardium: The pericardium was not assessed. Mitral Valve: The mitral valve is grossly normal. There is mild calcification of the mitral valve leaflet(s). No evidence of mitral valve regurgitation. Tricuspid Valve: The tricuspid valve is grossly normal. Tricuspid valve regurgitation is mild. Aortic Valve: The aortic valve is tricuspid. Aortic valve regurgitation is not visualized. Pulmonic Valve: The pulmonic valve was not well visualized. Pulmonic valve regurgitation is mild. Aorta: The aortic root is normal in size and structure. Venous: Unable to estimate CVP. The inferior vena cava was not well visualized. IAS/Shunts: The interatrial septum was not well visualized.  LEFT VENTRICLE PLAX 2D LVIDd:         3.83 cm  Diastology LVIDs:         2.91 cm  LV e' medial:    9.68 cm/s LV PW:         1.23 cm  LV E/e' medial:  6.6 LV IVS:        1.12 cm  LV e' lateral:   10.30 cm/s LVOT diam:     2.30 cm  LV E/e' lateral: 6.2 LV SV:          41 LV SV Index:   20 LVOT Area:     4.15 cm  RIGHT VENTRICLE RV Basal diam:  3.91 cm RV S prime:     6.74 cm/s TAPSE (M-mode): 2.0 cm LEFT ATRIUM           Index       RIGHT ATRIUM           Index LA diam:      3.40 cm 1.66 cm/m  RA Area:     17.40 cm LA Vol (A4C): 29.4 ml 14.35 ml/m RA Volume:  49.10 ml  23.97 ml/m  AORTIC VALVE LVOT Vmax:   60.70 cm/s LVOT Vmean:  32.800 cm/s LVOT VTI:    0.099 m  AORTA Ao Root diam: 3.20 cm MITRAL VALVE               TRICUSPID VALVE MV Area (PHT): 6.65 cm    TR Peak grad:   68.9 mmHg MV Decel Time: 114 msec    TR Vmax:        415.00 cm/s MV E velocity: 64.30 cm/s MV A velocity: 39.00 cm/s  SHUNTS MV E/A ratio:  1.65        Systemic VTI:  0.10 m                            Systemic Diam: 2.30 cm Nona Dell MD Electronically signed by Nona Dell MD Signature Date/Time: 07/29/2020/11:17:11 AM    Final     ASSESSMENT / PLAN:  Acute hypoxic respiratory failure Community acquired pneumonia -atypical organisms possible including other viruses Given slight leukocytosis and left shift, favor infectious etiology. ARDS survivor 07/2016  Recommend -Agree with ceftriaxone as a throw, obtain urine strep antigen and Legionella for completion -Await blood cultures , obtain respiratory viral panel -Attempt heated high flow to give him breaks on the BiPAP, on BiPAP would increase PEEP to 8 -High risk for mechanical ventilation -He has been placed on empiric steroids by primary team  Updated wife at bedside  Cyril Mourning MD. Tonny Bollman. [facility] See Amion for pager  If no response to pager , please call [phone]  After 7:00 pm call Elink  [phone]    07/29/2020, 1:10 PM

## 2020-07-29 NOTE — Progress Notes (Signed)
eLink Physician-Brief Progress Note Patient Name: Bradley [name] DOB: [birth year] MRN: [id number]   Date of Service  07/29/2020  HPI/Events of Note  Patient was transferred from [facility] with severe acute on chronic hypoxemic / hypercapnic respiratory failure due to a presumed community acquired pathogen, Covid and Flu testing were negative. He has a history of smoking related COPD. At the moment he is on the ventilator with 100 % FiO2, 18 of PEEP.  eICU Interventions  New Patient Evaluation completed, PCCM ground crew asked to see patient.        Bradley [name] Bradley [name] 07/29/2020, 10:59 PM

## 2020-07-29 NOTE — ED Notes (Signed)
Pt intubated with 7.5 ETT by Dr. Effie Shy. Positive color change on CO2 detector. Secured at 24.5cm at lip by RT.

## 2020-07-30 ENCOUNTER — Inpatient Hospital Stay (HOSPITAL_COMMUNITY): Payer: Self-pay

## 2020-07-30 ENCOUNTER — Inpatient Hospital Stay: Payer: Self-pay

## 2020-07-30 LAB — CBC
HCT: 50.4 % (ref 39.0–52.0)
Hemoglobin: 15.3 g/dL (ref 13.0–17.0)
MCH: 28.2 pg (ref 26.0–34.0)
MCHC: 30.4 g/dL (ref 30.0–36.0)
MCV: 93 fL (ref 80.0–100.0)
Platelets: 290 10*3/uL (ref 150–400)
RBC: 5.42 MIL/uL (ref 4.22–5.81)
RDW: 14.4 % (ref 11.5–15.5)
WBC: 13.3 10*3/uL — ABNORMAL HIGH (ref 4.0–10.5)
nRBC: 0 % (ref 0.0–0.2)

## 2020-07-30 LAB — POCT I-STAT 7, (LYTES, BLD GAS, ICA,H+H)
Acid-base deficit: 2 mmol/L (ref 0.0–2.0)
Acid-base deficit: 4 mmol/L — ABNORMAL HIGH (ref 0.0–2.0)
Bicarbonate: 26.8 mmol/L (ref 20.0–28.0)
Bicarbonate: 24.7 mmol/L (ref 20.0–28.0)
Calcium, Ion: 1.18 mmol/L (ref 1.15–1.40)
Calcium, Ion: 1.18 mmol/L (ref 1.15–1.40)
HCT: 41 % (ref 39.0–52.0)
HCT: 42 % (ref 39.0–52.0)
Hemoglobin: 13.9 g/dL (ref 13.0–17.0)
Hemoglobin: 14.3 g/dL (ref 13.0–17.0)
O2 Saturation: 100 %
O2 Saturation: 95 %
Patient temperature: 98.6
Patient temperature: 98.7
Potassium: 4.5 mmol/L (ref 3.5–5.1)
Potassium: 4.3 mmol/L (ref 3.5–5.1)
Sodium: 139 mmol/L (ref 135–145)
Sodium: 142 mmol/L (ref 135–145)
TCO2: 29 mmol/L (ref 22–32)
TCO2: 26 mmol/L (ref 22–32)
pCO2 arterial: 63.4 mmHg — ABNORMAL HIGH (ref 32.0–48.0)
pCO2 arterial: 57 mmHg — ABNORMAL HIGH (ref 32.0–48.0)
pH, Arterial: 7.235 — ABNORMAL LOW (ref 7.350–7.450)
pH, Arterial: 7.244 — ABNORMAL LOW (ref 7.350–7.450)
pO2, Arterial: 232 mmHg — ABNORMAL HIGH (ref 83.0–108.0)
pO2, Arterial: 87 mmHg (ref 83.0–108.0)

## 2020-07-30 LAB — BASIC METABOLIC PANEL
Anion gap: 15 (ref 5–15)
BUN: 33 mg/dL — ABNORMAL HIGH (ref 6–20)
CO2: 20 mmol/L — ABNORMAL LOW (ref 22–32)
Calcium: 8.6 mg/dL — ABNORMAL LOW (ref 8.9–10.3)
Chloride: 105 mmol/L (ref 98–111)
Creatinine, Ser: 1.55 mg/dL — ABNORMAL HIGH (ref 0.61–1.24)
GFR calc Af Amer: 56 mL/min — ABNORMAL LOW (ref >60)
GFR calc non Af Amer: 48 mL/min — ABNORMAL LOW (ref >60)
Glucose, Bld: 180 mg/dL — ABNORMAL HIGH (ref 70–99)
Potassium: 5 mmol/L (ref 3.5–5.1)
Sodium: 140 mmol/L (ref 135–145)

## 2020-07-30 LAB — RESPIRATORY PANEL BY PCR

## 2020-07-30 LAB — GLUCOSE, CAPILLARY
Glucose-Capillary: 177 mg/dL — ABNORMAL HIGH (ref 70–99)
Glucose-Capillary: 160 mg/dL — ABNORMAL HIGH (ref 70–99)
Glucose-Capillary: 180 mg/dL — ABNORMAL HIGH (ref 70–99)
Glucose-Capillary: 167 mg/dL — ABNORMAL HIGH (ref 70–99)
Glucose-Capillary: 173 mg/dL — ABNORMAL HIGH (ref 70–99)
Glucose-Capillary: 184 mg/dL — ABNORMAL HIGH (ref 70–99)

## 2020-07-30 LAB — TSH: TSH: 0.351 u[IU]/mL (ref 0.350–4.500)

## 2020-07-30 LAB — BRAIN NATRIURETIC PEPTIDE: B Natriuretic Peptide: 634.4 pg/mL — ABNORMAL HIGH (ref 0.0–100.0)

## 2020-07-30 LAB — PROCALCITONIN: Procalcitonin: 1.92 ng/mL

## 2020-07-30 LAB — HEMOGLOBIN A1C
Hgb A1c MFr Bld: 6.9 % — ABNORMAL HIGH (ref 4.8–5.6)
Mean Plasma Glucose: 151.33 mg/dL

## 2020-07-30 LAB — LACTATE DEHYDROGENASE: LDH: 341 U/L — ABNORMAL HIGH (ref 98–192)

## 2020-07-30 LAB — STREP PNEUMONIAE URINARY ANTIGEN: Strep Pneumo Urinary Antigen: NEGATIVE

## 2020-07-30 LAB — MAGNESIUM: Magnesium: 2.9 mg/dL — ABNORMAL HIGH (ref 1.7–2.4)

## 2020-07-30 LAB — PHOSPHORUS: Phosphorus: 5.9 mg/dL — ABNORMAL HIGH (ref 2.5–4.6)

## 2020-07-30 LAB — MRSA PCR SCREENING: MRSA by PCR: NEGATIVE

## 2020-07-30 MED ORDER — GUAIFENESIN 100 MG/5ML PO SOLN: 5.0000 mL | ORAL | Status: DC | PRN

## 2020-07-30 MED ORDER — NOREPINEPHRINE 4 MG/250ML-% IV SOLN
INTRAVENOUS | Status: AC
  Administered 2020-07-30: 2 ug/min via INTRAVENOUS
  Filled 2020-07-30: qty 250

## 2020-07-30 MED ORDER — SODIUM CHLORIDE 0.9 % IV SOLN: 250.0000 mL | INTRAVENOUS | Status: DC

## 2020-07-30 MED ORDER — PANTOPRAZOLE SODIUM 40 MG PO PACK
40.0000 mg | PACK | Freq: Every day | ORAL | Status: DC
  Administered 2020-07-30 – 2020-08-08 (×10): 40 mg
  Filled 2020-07-30 (×11): qty 20

## 2020-07-30 MED ORDER — ACETAMINOPHEN 160 MG/5ML PO SOLN
650.0000 mg | Freq: Four times a day (QID) | ORAL | Status: DC | PRN
  Administered 2020-08-01 – 2020-08-03 (×6): 650 mg
  Filled 2020-07-30 (×6): qty 20.3

## 2020-07-30 MED ORDER — HYDROCOD POLST-CPM POLST ER 10-8 MG/5ML PO SUER: 5.0000 mL | Freq: Two times a day (BID) | ORAL | Status: DC | PRN

## 2020-07-30 MED ORDER — IOHEXOL 350 MG/ML SOLN
100.0000 mL | Freq: Once | INTRAVENOUS | Status: AC | PRN
  Administered 2020-07-30: 100 mL via INTRAVENOUS

## 2020-07-30 MED ORDER — SODIUM CHLORIDE 0.9 % IV SOLN
250.0000 mL | INTRAVENOUS | Status: DC
  Administered 2020-07-30 – 2020-08-04 (×4): 250 mL via INTRAVENOUS

## 2020-07-30 MED ORDER — VITAL HIGH PROTEIN PO LIQD
1000.0000 mL | ORAL | Status: DC
  Administered 2020-07-30 – 2020-08-08 (×11): 1000 mL
  Filled 2020-07-30 (×7): qty 1000

## 2020-07-30 MED ORDER — NOREPINEPHRINE 4 MG/250ML-% IV SOLN: 0.0000 ug/min | INTRAVENOUS | Status: DC

## 2020-07-30 MED ORDER — NOREPINEPHRINE 4 MG/250ML-% IV SOLN: 2.0000 ug/min | INTRAVENOUS | Status: DC

## 2020-07-30 MED ORDER — SODIUM CHLORIDE 0.9% FLUSH
10.0000 mL | INTRAVENOUS | Status: DC | PRN
  Administered 2020-08-02: 10 mL

## 2020-07-30 MED ORDER — INSULIN ASPART 100 UNIT/ML ~~LOC~~ SOLN
0.0000 [IU] | SUBCUTANEOUS | Status: DC
  Administered 2020-07-30 (×3): 1 [IU] via SUBCUTANEOUS

## 2020-07-30 MED ORDER — FUROSEMIDE 10 MG/ML IJ SOLN
40.0000 mg | Freq: Once | INTRAMUSCULAR | Status: AC
  Administered 2020-07-30: 40 mg via INTRAVENOUS
  Filled 2020-07-30: qty 4

## 2020-07-30 MED ORDER — ONDANSETRON HCL 4 MG/5ML PO SOLN: 4.0000 mg | Freq: Four times a day (QID) | ORAL | Status: DC | PRN

## 2020-07-30 MED ORDER — SODIUM CHLORIDE 0.9% FLUSH
10.0000 mL | Freq: Two times a day (BID) | INTRAVENOUS | Status: DC
  Administered 2020-07-30: 10 mL
  Administered 2020-07-31: 20 mL
  Administered 2020-07-31 – 2020-08-03 (×6): 10 mL
  Administered 2020-08-03: 20 mL
  Administered 2020-08-04 – 2020-08-06 (×5): 10 mL
  Administered 2020-08-07: 20 mL
  Administered 2020-08-07: 10 mL
  Administered 2020-08-08: 20 mL
  Administered 2020-08-08: 10 mL

## 2020-07-30 MED ORDER — NOREPINEPHRINE 4 MG/250ML-% IV SOLN
2.0000 ug/min | INTRAVENOUS | Status: DC
  Administered 2020-07-30: 5 ug/min via INTRAVENOUS
  Administered 2020-07-30: 7 ug/min via INTRAVENOUS
  Administered 2020-07-31: 4 ug/min via INTRAVENOUS
  Administered 2020-08-01: 3 ug/min via INTRAVENOUS
  Administered 2020-08-03: 2 ug/min via INTRAVENOUS
  Filled 2020-07-30 (×7): qty 250

## 2020-07-30 MED ORDER — PHENYLEPHRINE HCL-NACL 10-0.9 MG/250ML-% IV SOLN
25.0000 ug/min | INTRAVENOUS | Status: DC
  Administered 2020-07-30: 25 ug/min via INTRAVENOUS

## 2020-07-30 MED ORDER — POLYETHYLENE GLYCOL 3350 17 G PO PACK
17.0000 g | PACK | Freq: Every day | ORAL | Status: DC
  Administered 2020-07-30 – 2020-08-01 (×3): 17 g
  Filled 2020-07-30 (×3): qty 1

## 2020-07-30 MED ORDER — PHENYLEPHRINE HCL-NACL 10-0.9 MG/250ML-% IV SOLN
INTRAVENOUS | Status: AC
  Filled 2020-07-30: qty 250

## 2020-07-30 MED ORDER — VITAL HIGH PROTEIN PO LIQD: 1000.0000 mL | ORAL | Status: DC

## 2020-07-30 MED ORDER — ONDANSETRON HCL 4 MG/2ML IJ SOLN: 4.0000 mg | Freq: Four times a day (QID) | INTRAMUSCULAR | Status: DC | PRN

## 2020-07-30 MED ORDER — TRAZODONE HCL 50 MG PO TABS: 50.0000 mg | ORAL_TABLET | Freq: Every evening | ORAL | Status: DC | PRN

## 2020-07-30 MED ORDER — INSULIN ASPART 100 UNIT/ML ~~LOC~~ SOLN
0.0000 [IU] | SUBCUTANEOUS | Status: DC
  Administered 2020-07-30 (×4): 3 [IU] via SUBCUTANEOUS
  Administered 2020-07-31 (×2): 5 [IU] via SUBCUTANEOUS

## 2020-07-30 MED ORDER — BISOPROLOL FUMARATE 5 MG PO TABS
5.0000 mg | ORAL_TABLET | Freq: Every day | ORAL | Status: DC
  Filled 2020-07-30: qty 1

## 2020-07-30 MED ORDER — PROSOURCE TF PO LIQD
45.0000 mL | Freq: Two times a day (BID) | ORAL | Status: DC
  Administered 2020-07-30: 45 mL
  Filled 2020-07-30: qty 45

## 2020-07-30 MED ORDER — PROSOURCE TF PO LIQD
45.0000 mL | Freq: Three times a day (TID) | ORAL | Status: DC
  Administered 2020-07-30 – 2020-08-08 (×27): 45 mL
  Filled 2020-07-30 (×27): qty 45

## 2020-07-30 NOTE — Progress Notes (Signed)
Initial Nutrition Assessment  DOCUMENTATION CODES:   Not applicable  INTERVENTION:   Tube feeding:  -Vital High Protein @ 50 ml/hr via OG (1200 ml) -45 ml ProSource TID  Provides: 1320 kcals (1742 kcal with propofol), 138 grams protein, 1003 ml free water. Meets 100% if needs.   NUTRITION DIAGNOSIS:   Increased nutrient needs related to acute illness as evidenced by estimated needs.  GOAL:   Patient will meet greater than or equal to 90% of their needs  MONITOR:   PO intake, Supplement acceptance, Weight trends, TF tolerance, Vent status, Diet advancement, I & O's, Labs  REASON FOR ASSESSMENT:   Consult, Ventilator Enteral/tube feeding initiation and management  ASSESSMENT:   Patient with PMH significant for COPD, DM, and ILD/pulmonary fibrosis. Presents this admission with CAP and bilateral infiltrates.   Pt discussed during ICU rounds and with RN.   On levophed and propofol. OG confirmed in stomach per xray.   Unable to obtain nutrition history. Of note, pt has history of multiple dental extractions.   Records lack weight history over the last year. Utilize 87.6 kg as EDW for now.   Patient is currently intubated on ventilator support MV: 17.6 L/min Temp (24hrs), Avg:98.3 F (36.8 C), Min:97.6 F (36.4 C), Max:98.7 F (37.1 C)  Propofol: 16 ml/hr- provides 422 kcal from lipids daily  Drips: levophed, propofol Medications: colace, SS novolog, solumedrol, miralax Labs: CBG 160-180 Cr 1.55-trending up  NUTRITION - FOCUSED PHYSICAL EXAM:    Most Recent Value  Orbital Region No depletion  Upper Arm Region No depletion  Thoracic and Lumbar Region Unable to assess  Buccal Region No depletion  Temple Region No depletion  Clavicle Bone Region No depletion  Clavicle and Acromion Bone Region No depletion  Scapular Bone Region No depletion  Dorsal Hand Unable to assess  Patellar Region Mild depletion  Anterior Thigh Region Mild depletion  Posterior Calf  Region Mild depletion  Edema (RD Assessment) None  Hair Reviewed  Eyes Reviewed  Mouth Reviewed  Skin Reviewed  Nails Reviewed     Diet Order:   Diet Order            Diet NPO time specified  Diet effective now                 EDUCATION NEEDS:   Education needs have been addressed  Skin:  Skin Assessment: Reviewed RN Assessment  Last BM:  PTA  Height:   Ht Readings from Last 1 Encounters:  07/29/20 5\' 9"  (1.753 m)    Weight:   Wt Readings from Last 1 Encounters:  07/29/20 87.6 kg    BMI:  Body mass index is 28.52 kg/m.  Estimated Nutritional Needs:   Kcal:  1685 kcal  Protein:  125-150 grams  Fluid:  >/= 1.6 L/day   07/31/20 RD, LDN Clinical Nutrition Pager listed in AMION

## 2020-07-30 NOTE — Progress Notes (Signed)
eLink Physician-Brief Progress Note Patient Name: Bradley [name] DOB: [birth year] MRN: [id number]   Date of Service  07/30/2020  HPI/Events of Note  Patient with hypotension and respiratory acidosis.  eICU Interventions  Peripheral Phenylephrine infusion ordered, respiratory rate now 35, bilateral soft wrist restraints ordered to prevent self-extubation.        [name] 07/30/2020, 12:20 AM

## 2020-07-30 NOTE — Progress Notes (Signed)
Pt transported from 2M01 to CT2 and back on the ventilator with RT, RN and transport. Pt tolerated well during, VSS throughout. Upon arrival back to pt room pt began to cough and fight the vent. Pt desated to 64%. Pt taken off the ventilator and bagged for 5 minutes with a peep valve/ 100%. SpO2 began to rise above 92% and pt placed back on ventilator. RT will continue to monitor.

## 2020-07-30 NOTE — Progress Notes (Signed)
NAME:  Bradley [name], MRN:  [id number], DOB:  [birth year], LOS: 2 ADMISSION DATE:  07/27/2020, CONSULTATION DATE:  07/29/20 REFERRING MD:  Jeni Salles CHIEF COMPLAINT:  Resp Distress   Brief History   59 year old ex-smoker admitted with severe acute hypoxic respiratory failure and bilateral infiltrates/community-acquired pneumonia Covid, flu negative  History of present illness   59 year old heavy ex-smoker who was seen by my partner Dr. Sherene Sires in the past, last 2017 PFTs showing isolated decrease in DLCO-felt to have a combination of emphysema noted on CT and RB ILD/fibrosis.  He was hospitalized 07/2016 for ARDS, no infectious etiology identified, autoimmune serologies were negative as except for isolated increase in RA factor, CCP negative.  He required a tracheostomy but recovered well from this illness and did not need long-term oxygen.  He has been lost to follow-up from the pulmonary clinic since then.  I note a chest x-ray in February 2018 which shows clearing of bilateral infiltrates. He now presents with 3 weeks of coughing up yellow-green sputum, no fevers, preceding URI symptoms, no sick contacts.  PCP gave him antibiotics but his dyspnea worsen until he arrived to the emergency room .  Chest x-ray independently reviewed by me shows multifocal bilateral infiltrates upper and lower lung zones.  Covid and flu testing was negative, labs showed WBC count of 11.3 with 90% neutrophils, BNP was low, troponins negative He was placed on high flow nasal cannula and then had desaturation on movement and was placed on a BiPAP overnight. PCCM asked to consult  Past Medical History  Emphysema/Pulmonary Fibrosis Anxiety Kidney Stone Pneumonia Diabetes Mellitus Type II  Significant Hospital Events   Intubated 07/29/20  Consults:  PCCM  Procedures:  Intubated 07/29/20  Significant Diagnostic Tests:  PFT 11/ 2016 showing isolated reduction in dlco to 59% Spirometry 12/06/2016 FEV1 2.37 (65%)  Ratio 62 ,FVC 3.84 (81%)  CT chest 07/14/15 c/w RBILD or DIP, not UIP CT angio chest 07/2016 >>Diffuse bilateral regional ground-glass airspace opacification, with underlying emphysema and interstitial prominence. This is markedly worsened from 2016, reflecting acute airspace disease superimposed on the patient's fibrotic change.  Enlarged mediastinal nodes, measuring up to 1.9 cm   Venous duplex 9/29 >> negative  Echo 9/30: LV EF 55%. Interventricular septal flattening in systole and diastole consistent with RV pressure and volume overload. RV-RA gradient is severely elevated at consistent with pulmonary hypertension. RV systolic function is moderately to severely reduced. RV size mildly enlarged. No valvular issues.  Micro Data:  Covid Negative Influenza Negative MRSA PCR Negative  Antimicrobials:  Azithromycin 06/30/2020>> Cefepime 07/13/2020>> Vancomycin 9/29/>>10/1  Interim history/subjective:  Patient intubated yesterday.   Objective   Blood pressure (!) 89/66, pulse 84, temperature 98.7 F (37.1 C), temperature source Oral, resp. rate (!) 35, height 5\' 9"  (1.753 m), weight 87.6 kg, SpO2 97 %.    Vent Mode: PRVC FiO2 (%):  [70 %-100 %] 70 % Set Rate:  [30 bmp-35 bmp] 35 bmp Vt Set:  [490 mL] 490 mL PEEP:  [14 cmH20-18 cmH20] 14 cmH20 Plateau Pressure:  [32 cmH20-40 cmH20] 34 cmH20   Intake/Output Summary (Last 24 hours) at 07/30/2020 0745 Last data filed at 07/30/2020 0700 Gross per 24 hour  Intake 1861.14 ml  Output 285 ml  Net 1576.14 ml   Filed Weights   07/04/2020 0816 07/29/20 2228  Weight: 88.9 kg 87.6 kg   Examination: General: intubated, sedated, no acute distress HENT: PERRL, Sclera anicteric, ET Tube in place Lungs: course respirations. No wheezing  or rhonchi. Cardiovascular: RRR, no murmurs, S1S2 Abdomen: soft, non-distended with bowel sounds present Extremities: no edema. Lower extremities cool to touch Neuro: sedated GU: Foley in  place  Resolved Hospital Problem list     Assessment & Plan:  Acute Hypoxemic and Hypercapnic Respiratory Failure In setting of underlying pulmonary emphysema with possible fibrosis and pulmonary hypertension presenting with pneumonia - Continue ventilator support - P/F ratio 145 this morning, has been weaned to 60% FiO2 Shock - Concern for shunt physiology regarding the pneumonia vs. Pulmonary emboli  - Remains on therapeutic lovenox - Obtain CT Chest PE protocol - If CT chest does not indicate PE we will stop therapeutic lovenox and work on diuresis plus consideration of inhaled nitric oxide for underlying pulm hypertension - Cefepime + azithromycin for pneumonia coverage - Send Respiratory Cultures - Nebs + steroids for COPD - Propofol + fentanyl for sedation  Shock In setting of pneumonia, respiratory failure and right heart failure - Continue levophed - Will start diuresis and/or inhaled NO for the right heart failure - Check TSH  Diabetes Mellitus - SSI  Depression - Cont home cymbalta  Best practice:  Diet: TF Pain/Anxiety/Delirium protocol (if indicated): PAD Protocol Ordered VAP protocol (if indicated): Ordered DVT prophylaxis: Lovenox GI prophylaxis: PPI daily Glucose control: SSI Mobility: Bed Rest Code Status: Full Family Communication:  Disposition: ICU  Labs   CBC: Recent Labs  Lab 07/05/2020 0840 07/29/20 0801 07/29/20 2300 07/30/20 0439 07/30/20 0703  WBC 11.3* 20.0*  --   --  13.3*  NEUTROABS 10.1*  --   --   --   --   HGB 14.9 15.6 14.6 13.9 15.3  HCT 45.6 48.4 43.0 41.0 50.4  MCV 90.5 90.8  --   --  93.0  PLT 225 301  --   --  290    Basic Metabolic Panel: Recent Labs  Lab 07/14/2020 0840 07/29/20 0801 07/29/20 2300 07/30/20 0439  NA 136 139 142 139  K 3.3* 4.2 4.4 4.5  CL 101 104  --   --   CO2 22 25  --   --   GLUCOSE 159* 154*  --   --   BUN 10 19  --   --   CREATININE 0.86 0.85  --   --   CALCIUM 8.7* 8.7*  --   --     GFR: Estimated Creatinine Clearance: 102.6 mL/min (by C-G formula based on SCr of 0.85 mg/dL). Recent Labs  Lab 06/30/2020 0840 07/07/2020 1820 06/30/2020 1953 07/29/20 0801 07/30/20 0117 07/30/20 0703  PROCALCITON  --   --   --   --  1.92  --   WBC 11.3*  --   --  20.0*  --  13.3*  LATICACIDVEN  --  2.6* 2.6*  --   --   --     Liver Function Tests: Recent Labs  Lab 07/17/2020 0840  AST 20  ALT 15  ALKPHOS 53  BILITOT 1.0  PROT 7.4  ALBUMIN 3.3*   No results for input(s): LIPASE, AMYLASE in the last 168 hours. No results for input(s): AMMONIA in the last 168 hours.  ABG    Component Value Date/Time   PHART 7.235 (L) 07/30/2020 0439   PCO2ART 63.4 (H) 07/30/2020 0439   PO2ART 232 (H) 07/30/2020 0439   HCO3 26.8 07/30/2020 0439   TCO2 29 07/30/2020 0439   ACIDBASEDEF 2.0 07/30/2020 0439   O2SAT 100.0 07/30/2020 0439     Coagulation Profile: No results  for input(s): INR, PROTIME in the last 168 hours.  Cardiac Enzymes: No results for input(s): CKTOTAL, CKMB, CKMBINDEX, TROPONINI in the last 168 hours.  HbA1C: Hgb A1c MFr Bld  Date/Time Value Ref Range Status  09/16/2016 04:40 AM 6.9 (H) 4.8 - 5.6 % Final    Comment:    (NOTE)         Pre-diabetes: 5.7 - 6.4         Diabetes: >6.4         Glycemic control for adults with diabetes: <7.0     CBG: Recent Labs  Lab 07/29/20 1908 07/29/20 2226 07/29/20 2345 07/30/20 0308 07/30/20 0706  GLUCAP 187* 199* 199* 177* 160*    Critical care time: 28     Melody Comas, MD [facility] Office: [phone]   See Amion for Pager Details

## 2020-07-30 NOTE — Progress Notes (Signed)
ABG results given to Dr. Francine Graven. No new orders received at this time. RT will continue to monitor.

## 2020-07-30 NOTE — Progress Notes (Signed)
Peripherally Inserted Central Catheter Placement  The IV Nurse has discussed with the patient and/or persons authorized to consent for the patient, the purpose of this procedure and the potential benefits and risks involved with this procedure.  The benefits include less needle sticks, lab draws from the catheter, and the patient may be discharged home with the catheter. Risks include, but not limited to, infection, bleeding, blood clot (thrombus formation), and puncture of an artery; nerve damage and irregular heartbeat and possibility to perform a PICC exchange if needed/ordered by physician.  Alternatives to this procedure were also discussed.  Bard Power PICC patient education guide, fact sheet on infection prevention and patient information card has been provided to patient /or left at bedside.    PICC Placement Documentation  PICC Double Lumen 07/30/20 PICC Right Brachial 40 cm 0 cm (Active)  Indication for Insertion or Continuance of Line Vasoactive infusions 07/30/20 1136  Exposed Catheter (cm) 0 cm 07/30/20 1136  Site Assessment Clean;Dry;Intact 07/30/20 1136  Lumen #1 Status Flushed;Blood return noted;Saline locked 07/30/20 1136  Lumen #2 Status Flushed;Blood return noted;Saline locked 07/30/20 1136  Dressing Type Transparent;Securing device 07/30/20 1136  Dressing Status Clean;Dry;Intact 07/30/20 1136  Antimicrobial disc in place? Yes 07/30/20 1136  Safety Lock Not Applicable 07/30/20 1136  Dressing Change Due 08/06/20 07/30/20 1136       Romie Jumper 07/30/2020, 11:38 AM

## 2020-07-30 DEATH — deceased

## 2020-07-31 LAB — BASIC METABOLIC PANEL
Anion gap: 11 (ref 5–15)
Anion gap: 9 (ref 5–15)
BUN: 51 mg/dL — ABNORMAL HIGH (ref 6–20)
BUN: 62 mg/dL — ABNORMAL HIGH (ref 6–20)
CO2: 21 mmol/L — ABNORMAL LOW (ref 22–32)
CO2: 27 mmol/L (ref 22–32)
Calcium: 8.3 mg/dL — ABNORMAL LOW (ref 8.9–10.3)
Calcium: 8.4 mg/dL — ABNORMAL LOW (ref 8.9–10.3)
Chloride: 109 mmol/L (ref 98–111)
Chloride: 112 mmol/L — ABNORMAL HIGH (ref 98–111)
Creatinine, Ser: 1.46 mg/dL — ABNORMAL HIGH (ref 0.61–1.24)
Creatinine, Ser: 1.47 mg/dL — ABNORMAL HIGH (ref 0.61–1.24)
GFR calc Af Amer: >60 mL/min (ref >60)
GFR calc Af Amer: 60 mL/min — ABNORMAL LOW (ref >60)
GFR calc non Af Amer: 52 mL/min — ABNORMAL LOW (ref >60)
GFR calc non Af Amer: 51 mL/min — ABNORMAL LOW (ref >60)
Glucose, Bld: 224 mg/dL — ABNORMAL HIGH (ref 70–99)
Glucose, Bld: 174 mg/dL — ABNORMAL HIGH (ref 70–99)
Potassium: 4.1 mmol/L (ref 3.5–5.1)
Potassium: 4.6 mmol/L (ref 3.5–5.1)
Sodium: 141 mmol/L (ref 135–145)
Sodium: 148 mmol/L — ABNORMAL HIGH (ref 135–145)

## 2020-07-31 LAB — TRIGLYCERIDES: Triglycerides: 774 mg/dL — ABNORMAL HIGH (ref <150)

## 2020-07-31 LAB — GLUCOSE, CAPILLARY
Glucose-Capillary: 230 mg/dL — ABNORMAL HIGH (ref 70–99)
Glucose-Capillary: 214 mg/dL — ABNORMAL HIGH (ref 70–99)
Glucose-Capillary: 328 mg/dL — ABNORMAL HIGH (ref 70–99)
Glucose-Capillary: 156 mg/dL — ABNORMAL HIGH (ref 70–99)
Glucose-Capillary: 169 mg/dL — ABNORMAL HIGH (ref 70–99)
Glucose-Capillary: 177 mg/dL — ABNORMAL HIGH (ref 70–99)

## 2020-07-31 LAB — CBC
HCT: 40.7 % (ref 39.0–52.0)
Hemoglobin: 13 g/dL (ref 13.0–17.0)
MCH: 29.5 pg (ref 26.0–34.0)
MCHC: 31.9 g/dL (ref 30.0–36.0)
MCV: 92.3 fL (ref 80.0–100.0)
Platelets: 229 10*3/uL (ref 150–400)
RBC: 4.41 MIL/uL (ref 4.22–5.81)
RDW: 14.4 % (ref 11.5–15.5)
WBC: 9.3 10*3/uL (ref 4.0–10.5)
nRBC: 0 % (ref 0.0–0.2)

## 2020-07-31 LAB — POCT I-STAT 7, (LYTES, BLD GAS, ICA,H+H)
Acid-Base Excess: 1 mmol/L (ref 0.0–2.0)
Bicarbonate: 28.9 mmol/L — ABNORMAL HIGH (ref 20.0–28.0)
Calcium, Ion: 1.17 mmol/L (ref 1.15–1.40)
HCT: 38 % — ABNORMAL LOW (ref 39.0–52.0)
Hemoglobin: 12.9 g/dL — ABNORMAL LOW (ref 13.0–17.0)
O2 Saturation: 100 %
Patient temperature: 98.6
Potassium: 3.9 mmol/L (ref 3.5–5.1)
Sodium: 141 mmol/L (ref 135–145)
TCO2: 31 mmol/L (ref 22–32)
pCO2 arterial: 57.8 mmHg — ABNORMAL HIGH (ref 32.0–48.0)
pH, Arterial: 7.307 — ABNORMAL LOW (ref 7.350–7.450)
pO2, Arterial: 236 mmHg — ABNORMAL HIGH (ref 83.0–108.0)

## 2020-07-31 LAB — PHOSPHORUS
Phosphorus: 4.3 mg/dL (ref 2.5–4.6)
Phosphorus: 3.7 mg/dL (ref 2.5–4.6)

## 2020-07-31 LAB — PROCALCITONIN: Procalcitonin: 1.94 ng/mL

## 2020-07-31 LAB — MAGNESIUM: Magnesium: 3.3 mg/dL — ABNORMAL HIGH (ref 1.7–2.4)

## 2020-07-31 MED ORDER — INSULIN GLARGINE 100 UNIT/ML ~~LOC~~ SOLN
10.0000 [IU] | Freq: Every day | SUBCUTANEOUS | Status: DC
  Administered 2020-07-31 – 2020-08-01 (×2): 10 [IU] via SUBCUTANEOUS
  Filled 2020-07-31 (×3): qty 0.1

## 2020-07-31 MED ORDER — INSULIN ASPART 100 UNIT/ML ~~LOC~~ SOLN
3.0000 [IU] | SUBCUTANEOUS | Status: DC
  Administered 2020-07-31 – 2020-08-02 (×12): 3 [IU] via SUBCUTANEOUS

## 2020-07-31 MED ORDER — VECURONIUM BROMIDE 10 MG IV SOLR
0.1000 mg/kg | INTRAVENOUS | Status: DC | PRN
  Administered 2020-07-31 – 2020-08-03 (×14): 8.9 mg via INTRAVENOUS
  Filled 2020-07-31 (×15): qty 10

## 2020-07-31 MED ORDER — FENTANYL CITRATE (PF) 100 MCG/2ML IJ SOLN
100.0000 ug | Freq: Once | INTRAMUSCULAR | Status: AC
  Administered 2020-07-31: 100 ug via INTRAVENOUS

## 2020-07-31 MED ORDER — DEXMEDETOMIDINE HCL IN NACL 400 MCG/100ML IV SOLN
0.4000 ug/kg/h | INTRAVENOUS | Status: DC
  Administered 2020-07-31: 0.4 ug/kg/h via INTRAVENOUS
  Administered 2020-07-31: 1 ug/kg/h via INTRAVENOUS
  Administered 2020-07-31: 1.2 ug/kg/h via INTRAVENOUS
  Administered 2020-07-31: 1 ug/kg/h via INTRAVENOUS
  Administered 2020-08-01 – 2020-08-03 (×15): 1.2 ug/kg/h via INTRAVENOUS
  Administered 2020-08-03: 0.8 ug/kg/h via INTRAVENOUS
  Filled 2020-07-31 (×21): qty 100

## 2020-07-31 MED ORDER — REVEFENACIN 175 MCG/3ML IN SOLN
175.0000 ug | Freq: Every day | RESPIRATORY_TRACT | Status: DC
  Administered 2020-08-01 – 2020-08-08 (×7): 175 ug via RESPIRATORY_TRACT
  Filled 2020-07-31 (×9): qty 3

## 2020-07-31 MED ORDER — FREE WATER
200.0000 mL | Status: DC
  Administered 2020-07-31 – 2020-08-01 (×4): 200 mL

## 2020-07-31 MED ORDER — ENOXAPARIN SODIUM 40 MG/0.4ML ~~LOC~~ SOLN
40.0000 mg | SUBCUTANEOUS | Status: DC
  Administered 2020-07-31 – 2020-08-08 (×9): 40 mg via SUBCUTANEOUS
  Filled 2020-07-31 (×9): qty 0.4

## 2020-07-31 MED ORDER — POTASSIUM CHLORIDE 20 MEQ PO PACK
40.0000 meq | PACK | Freq: Once | ORAL | Status: AC
  Administered 2020-07-31: 40 meq via NASOGASTRIC
  Filled 2020-07-31: qty 2

## 2020-07-31 MED ORDER — MIDAZOLAM HCL 2 MG/2ML IJ SOLN
2.0000 mg | INTRAMUSCULAR | Status: DC | PRN
  Administered 2020-07-31 – 2020-08-05 (×19): 2 mg via INTRAVENOUS
  Filled 2020-07-31 (×19): qty 2

## 2020-07-31 MED ORDER — INSULIN ASPART 100 UNIT/ML ~~LOC~~ SOLN
0.0000 [IU] | SUBCUTANEOUS | Status: DC
  Administered 2020-07-31 (×3): 4 [IU] via SUBCUTANEOUS
  Administered 2020-07-31: 15 [IU] via SUBCUTANEOUS
  Administered 2020-08-01 (×2): 7 [IU] via SUBCUTANEOUS
  Administered 2020-08-01: 3 [IU] via SUBCUTANEOUS
  Administered 2020-08-01: 7 [IU] via SUBCUTANEOUS
  Administered 2020-08-01: 4 [IU] via SUBCUTANEOUS
  Administered 2020-08-01 – 2020-08-02 (×2): 7 [IU] via SUBCUTANEOUS
  Administered 2020-08-02: 4 [IU] via SUBCUTANEOUS
  Administered 2020-08-02: 3 [IU] via SUBCUTANEOUS
  Administered 2020-08-02: 7 [IU] via SUBCUTANEOUS
  Administered 2020-08-02 – 2020-08-03 (×5): 4 [IU] via SUBCUTANEOUS
  Administered 2020-08-03: 7 [IU] via SUBCUTANEOUS
  Administered 2020-08-03: 3 [IU] via SUBCUTANEOUS
  Administered 2020-08-04 (×2): 4 [IU] via SUBCUTANEOUS
  Administered 2020-08-04: 3 [IU] via SUBCUTANEOUS
  Administered 2020-08-04: 4 [IU] via SUBCUTANEOUS
  Administered 2020-08-04 (×2): 3 [IU] via SUBCUTANEOUS
  Administered 2020-08-05 (×2): 4 [IU] via SUBCUTANEOUS
  Administered 2020-08-05: 3 [IU] via SUBCUTANEOUS
  Administered 2020-08-05 (×2): 4 [IU] via SUBCUTANEOUS
  Administered 2020-08-06: 7 [IU] via SUBCUTANEOUS
  Administered 2020-08-06: 4 [IU] via SUBCUTANEOUS
  Administered 2020-08-06 (×2): 3 [IU] via SUBCUTANEOUS
  Administered 2020-08-07: 7 [IU] via SUBCUTANEOUS
  Administered 2020-08-07 (×2): 4 [IU] via SUBCUTANEOUS
  Administered 2020-08-07 (×2): 3 [IU] via SUBCUTANEOUS
  Administered 2020-08-07: 4 [IU] via SUBCUTANEOUS
  Administered 2020-08-07: 3 [IU] via SUBCUTANEOUS
  Administered 2020-08-08 (×3): 7 [IU] via SUBCUTANEOUS

## 2020-07-31 MED ORDER — MIDAZOLAM HCL 2 MG/2ML IJ SOLN
INTRAMUSCULAR | Status: AC
  Filled 2020-07-31: qty 2

## 2020-07-31 MED ORDER — ARTIFICIAL TEARS OPHTHALMIC OINT
1.0000 "application " | TOPICAL_OINTMENT | Freq: Three times a day (TID) | OPHTHALMIC | Status: DC
  Administered 2020-07-31 – 2020-08-05 (×15): 1 via OPHTHALMIC
  Filled 2020-07-31: qty 3.5

## 2020-07-31 MED ORDER — FUROSEMIDE 10 MG/ML IJ SOLN
40.0000 mg | Freq: Two times a day (BID) | INTRAMUSCULAR | Status: AC
  Administered 2020-07-31 (×2): 40 mg via INTRAVENOUS
  Filled 2020-07-31 (×2): qty 4

## 2020-07-31 MED ORDER — BISACODYL 10 MG RE SUPP
10.0000 mg | Freq: Once | RECTAL | Status: AC
  Administered 2020-07-31: 10 mg via RECTAL
  Filled 2020-07-31: qty 1

## 2020-07-31 NOTE — Progress Notes (Signed)
NAME:  Bradley [name], MRN:  [id number], DOB:  August 11, 1961, LOS: 3 ADMISSION DATE:  Aug 16, 2020, CONSULTATION DATE:  07/29/20 REFERRING MD:  Jeni Salles CHIEF COMPLAINT:  Resp Distress   Brief History   59 year old ex-smoker admitted with severe acute hypoxic respiratory failure and bilateral infiltrates/community-acquired pneumonia Covid, flu negative  History of present illness   59 year old heavy ex-smoker who was seen by my partner Dr. Sherene Sires in the past, last 2017 PFTs showing isolated decrease in DLCO-felt to have a combination of emphysema noted on CT and RB ILD/fibrosis.  He was hospitalized 07/2016 for ARDS, no infectious etiology identified, autoimmune serologies were negative as except for isolated increase in RA factor, CCP negative.  He required a tracheostomy but recovered well from this illness and did not need long-term oxygen.  He has been lost to follow-up from the pulmonary clinic since then.  I note a chest x-ray in February 2018 which shows clearing of bilateral infiltrates. He now presents with 3 weeks of coughing up yellow-green sputum, no fevers, preceding URI symptoms, no sick contacts.  PCP gave him antibiotics but his dyspnea worsen until he arrived to the emergency room .  Chest x-ray independently reviewed by me shows multifocal bilateral infiltrates upper and lower lung zones.  Covid and flu testing was negative, labs showed WBC count of 11.3 with 90% neutrophils, BNP was low, troponins negative He was placed on high flow nasal cannula and then had desaturation on movement and was placed on a BiPAP overnight. PCCM asked to consult  Past Medical History  Emphysema/Pulmonary Fibrosis Anxiety Kidney Stone Pneumonia Diabetes Mellitus Type II  Significant Hospital Events   Intubated 07/29/20  Consults:  PCCM  Procedures:  Intubated 07/29/20  Significant Diagnostic Tests:  PFT 11/ 2016 showing isolated reduction in dlco to 59% Spirometry 12/06/2016 FEV1 2.37 (65%)  Ratio 62 ,FVC 3.84 (81%)  CT chest 07/14/15 c/w RBILD or DIP, not UIP CT angio chest 07/2016 >>Diffuse bilateral regional ground-glass airspace opacification, with underlying emphysema and interstitial prominence. This is markedly worsened from 2016, reflecting acute airspace disease superimposed on the patient's fibrotic change.  Enlarged mediastinal nodes, measuring up to 1.9 cm   Venous duplex 9/29 >> negative  Echo 9/30: LV EF 55%. Interventricular septal flattening in systole and diastole consistent with RV pressure and volume overload. RV-RA gradient is severely elevated at consistent with pulmonary hypertension. RV systolic function is moderately to severely reduced. RV size mildly enlarged. No valvular issues.  Micro Data:  Covid Negative Influenza Negative MRSA PCR Negative  Antimicrobials:  Azithromycin 16-Aug-2020>> Cefepime 08/16/2020>> Vancomycin 9/29/>>10/1  Interim history/subjective:  Patient had episodes of desaturations due to coughing overnight.   Remains on same vent settings as yesterday. Levophed requirement trending down.   Objective   Blood pressure 102/68, pulse (!) 103, temperature 98.2 F (36.8 C), temperature source Axillary, resp. rate (!) 35, height 5\' 9"  (1.753 m), weight 88.8 kg, SpO2 96 %.    Vent Mode: PRVC FiO2 (%):  [60 %-100 %] 70 % Set Rate:  [35 bmp] 35 bmp Vt Set:  [490 mL] 490 mL PEEP:  [14 cmH20] 14 cmH20 Plateau Pressure:  [14 cmH20-34 cmH20] 14 cmH20   Intake/Output Summary (Last 24 hours) at 07/31/2020 0733 Last data filed at 07/31/2020 0733 Gross per 24 hour  Intake 2792.71 ml  Output 2155 ml  Net 637.71 ml   Filed Weights   16-Aug-2020 0816 07/29/20 2228 07/31/20 0335  Weight: 88.9 kg 87.6 kg 88.8 kg  Examination: General: intubated, sedated, no acute distress HENT: PERRL, Sclera anicteric, ET Tube in place Lungs: course respirations. No wheezing or rhonchi. Cardiovascular: tachycardic, no murmurs, S1S2 Abdomen: soft,  non-distended with bowel sounds present Extremities: trace edema. Lower extremities cool to touch Neuro: sedated GU: Foley in place  Resolved Hospital Problem list     Assessment & Plan:  Acute Hypoxemic and Hypercapnic Respiratory Failure In setting of underlying pulmonary emphysema with possible fibrosis and pulmonary hypertension presenting with pneumonia - Continue ventilator support - Concern for shunt physiology regarding the pneumonia and underlying lung disease - CTA Chest 10/2 negative for PE - Cefepime + azithromycin for pneumonia coverage - Send Respiratory Cultures - Nebs + steroids for COPD - Precedex + fentanyl for sedation. Propfofol stopped 10/2 - TGs 774  Shock In setting of pneumonia, respiratory failure and right heart failure - Continue levophed - Continue spot lasix - Will trial inhaled NO for the right heart failure if not improving with diuresis alone - TSH within normal limits  Diabetes Mellitus - SSI - Will add TF coverage or basal insulin if blood sugars continue to climb  Depression - Cont home cymbalta  Best practice:  Diet: TF Pain/Anxiety/Delirium protocol (if indicated): PAD Protocol Ordered VAP protocol (if indicated): Ordered DVT prophylaxis: Lovenox GI prophylaxis: PPI daily Glucose control: SSI Mobility: Bed Rest Code Status: Full Family Communication:  Disposition: ICU  Labs   CBC: Recent Labs  Lab 07/24/2020 0840 07/04/2020 0840 07/29/20 0801 07/29/20 2300 07/30/20 0439 07/30/20 0703 07/30/20 1005 07/31/20 0340 07/31/20 0535  WBC 11.3*  --  20.0*  --   --  13.3*  --  9.3  --   NEUTROABS 10.1*  --   --   --   --   --   --   --   --   HGB 14.9   < > 15.6   < > 13.9 15.3 14.3 13.0 12.9*  HCT 45.6   < > 48.4   < > 41.0 50.4 42.0 40.7 38.0*  MCV 90.5  --  90.8  --   --  93.0  --  92.3  --   PLT 225  --  301  --   --  290  --  229  --    < > = values in this interval not displayed.    Basic Metabolic Panel: Recent Labs    Lab 07/14/2020 0840 07/13/2020 0840 07/29/20 0801 07/29/20 2300 07/30/20 0439 07/30/20 0703 07/30/20 1005 07/30/20 1151 07/31/20 0340 07/31/20 0535  NA 136   < > 139   < > 139 140 142  --  141 141  K 3.3*   < > 4.2   < > 4.5 5.0 4.3  --  4.1 3.9  CL 101  --  104  --   --  105  --   --  109  --   CO2 22  --  25  --   --  20*  --   --  21*  --   GLUCOSE 159*  --  154*  --   --  180*  --   --  224*  --   BUN 10  --  19  --   --  33*  --   --  51*  --   CREATININE 0.86  --  0.85  --   --  1.55*  --   --  1.46*  --   CALCIUM 8.7*  --  8.7*  --   --  8.6*  --   --  8.3*  --   MG  --   --   --   --   --   --   --  2.9* 3.3*  --   PHOS  --   --   --   --   --   --   --  5.9* 4.3  --    < > = values in this interval not displayed.   GFR: Estimated Creatinine Clearance: 60 mL/min (A) (by C-G formula based on SCr of 1.46 mg/dL (H)). Recent Labs  Lab 2020/08/02 0840 August 02, 2020 1820 08/02/2020 1953 07/29/20 0801 07/30/20 0117 07/30/20 0703 07/31/20 0340  PROCALCITON  --   --   --   --  1.92  --  1.94  WBC 11.3*  --   --  20.0*  --  13.3* 9.3  LATICACIDVEN  --  2.6* 2.6*  --   --   --   --     Liver Function Tests: Recent Labs  Lab 08/02/20 0840  AST 20  ALT 15  ALKPHOS 53  BILITOT 1.0  PROT 7.4  ALBUMIN 3.3*   No results for input(s): LIPASE, AMYLASE in the last 168 hours. No results for input(s): AMMONIA in the last 168 hours.  ABG    Component Value Date/Time   PHART 7.307 (L) 07/31/2020 0535   PCO2ART 57.8 (H) 07/31/2020 0535   PO2ART 236 (H) 07/31/2020 0535   HCO3 28.9 (H) 07/31/2020 0535   TCO2 31 07/31/2020 0535   ACIDBASEDEF 4.0 (H) 07/30/2020 1005   O2SAT 100.0 07/31/2020 0535     Coagulation Profile: No results for input(s): INR, PROTIME in the last 168 hours.  Cardiac Enzymes: No results for input(s): CKTOTAL, CKMB, CKMBINDEX, TROPONINI in the last 168 hours.  HbA1C: Hgb A1c MFr Bld  Date/Time Value Ref Range Status  07/30/2020 11:51 AM 6.9 (H) 4.8 - 5.6  % Final    Comment:    (NOTE) Pre diabetes:          5.7%-6.4%  Diabetes:              >6.4%  Glycemic control for   <7.0% adults with diabetes   09/16/2016 04:40 AM 6.9 (H) 4.8 - 5.6 % Final    Comment:    (NOTE)         Pre-diabetes: 5.7 - 6.4         Diabetes: >6.4         Glycemic control for adults with diabetes: <7.0     CBG: Recent Labs  Lab 07/30/20 1520 07/30/20 1902 07/30/20 2307 07/31/20 0306 07/31/20 0710  GLUCAP 167* 173* 184* 230* 214*    Critical care time: 67     Melody Comas, MD [facility] Office: [phone]   See Amion for Pager Details

## 2020-07-31 NOTE — Progress Notes (Signed)
Called to bedside re: persistent desaturation.   Pt became highly agitated during cares, suddenly requiring 100% FiO2 and larger quantities of sedation as documented.  Despite this pt still had higher FiO2 requirement (from 60% to 100%).  Pt was given vecuronium push with near immediate resolution of desaturation.  PIP also went from 41-43 to 36-38.  Will obtain ABG

## 2020-07-31 NOTE — Progress Notes (Signed)
Pt initiated on 20PPM iNO per physician order. RT will continue to monitor.

## 2020-07-31 NOTE — Progress Notes (Signed)
Patient at maximum dose range for Fentanyl and Propofol and coughing/desaturations continuing. Elink notified. One time Fentanyl 100 mcg bolus ordered/administered for airway protection. RT at bedside as well.

## 2020-07-31 NOTE — Progress Notes (Addendum)
eLink Physician-Brief Progress Note Patient Name: ATLEY NEUBERT DOB: [birth year] MRN: [id number]   Date of Service  07/31/2020  HPI/Events of Note  Coughing spells on the vent resulting in desaturations. Nothing returned from in-line suctioning.   eICU Interventions  Fent 100 mcg IV push. RT to give additional bronchodilator treatment. CTM.   ADDENDUM: - Fentanyl ineffective. Reassessed patient with RT and RN. Desatting again to 80-83%. Tensing abdomen with respirations. - Will give push of vecuronium. Obtain ABG a short time later. Check for auto-PEEP once paralyzed.  Intervention Category Major Interventions: Other:  Janae Bridgeman 07/31/2020, 4:35 AM

## 2020-08-01 ENCOUNTER — Inpatient Hospital Stay (HOSPITAL_COMMUNITY): Payer: Self-pay

## 2020-08-01 DIAGNOSIS — J449 Chronic obstructive pulmonary disease, unspecified: Secondary | ICD-10-CM

## 2020-08-01 DIAGNOSIS — J8410 Pulmonary fibrosis, unspecified: Secondary | ICD-10-CM

## 2020-08-01 LAB — BASIC METABOLIC PANEL
Anion gap: 9 (ref 5–15)
BUN: 59 mg/dL — ABNORMAL HIGH (ref 6–20)
CO2: 29 mmol/L (ref 22–32)
Calcium: 8 mg/dL — ABNORMAL LOW (ref 8.9–10.3)
Chloride: 111 mmol/L (ref 98–111)
Creatinine, Ser: 1.21 mg/dL (ref 0.61–1.24)
GFR calc Af Amer: >60 mL/min (ref >60)
GFR calc non Af Amer: >60 mL/min (ref >60)
Glucose, Bld: 189 mg/dL — ABNORMAL HIGH (ref 70–99)
Potassium: 4.8 mmol/L (ref 3.5–5.1)
Sodium: 149 mmol/L — ABNORMAL HIGH (ref 135–145)

## 2020-08-01 LAB — TRIGLYCERIDES
Triglycerides: 142 mg/dL (ref <150)
Triglycerides: 187 mg/dL — ABNORMAL HIGH (ref <150)

## 2020-08-01 LAB — POCT I-STAT 7, (LYTES, BLD GAS, ICA,H+H)
Acid-Base Excess: 4 mmol/L — ABNORMAL HIGH (ref 0.0–2.0)
Bicarbonate: 32.3 mmol/L — ABNORMAL HIGH (ref 20.0–28.0)
Calcium, Ion: 1.29 mmol/L (ref 1.15–1.40)
HCT: 36 % — ABNORMAL LOW (ref 39.0–52.0)
Hemoglobin: 12.2 g/dL — ABNORMAL LOW (ref 13.0–17.0)
O2 Saturation: 96 %
Patient temperature: 100.4
Potassium: 5.2 mmol/L — ABNORMAL HIGH (ref 3.5–5.1)
Sodium: 147 mmol/L — ABNORMAL HIGH (ref 135–145)
TCO2: 34 mmol/L — ABNORMAL HIGH (ref 22–32)
pCO2 arterial: 66.8 mmHg (ref 32.0–48.0)
pH, Arterial: 7.297 — ABNORMAL LOW (ref 7.350–7.450)
pO2, Arterial: 97 mmHg (ref 83.0–108.0)

## 2020-08-01 LAB — GLUCOSE, CAPILLARY
Glucose-Capillary: 192 mg/dL — ABNORMAL HIGH (ref 70–99)
Glucose-Capillary: 204 mg/dL — ABNORMAL HIGH (ref 70–99)
Glucose-Capillary: 219 mg/dL — ABNORMAL HIGH (ref 70–99)
Glucose-Capillary: 125 mg/dL — ABNORMAL HIGH (ref 70–99)
Glucose-Capillary: 216 mg/dL — ABNORMAL HIGH (ref 70–99)
Glucose-Capillary: 210 mg/dL — ABNORMAL HIGH (ref 70–99)

## 2020-08-01 LAB — CBC
HCT: 37.6 % — ABNORMAL LOW (ref 39.0–52.0)
Hemoglobin: 11.4 g/dL — ABNORMAL LOW (ref 13.0–17.0)
MCH: 28.4 pg (ref 26.0–34.0)
MCHC: 30.3 g/dL (ref 30.0–36.0)
MCV: 93.8 fL (ref 80.0–100.0)
Platelets: 166 10*3/uL (ref 150–400)
RBC: 4.01 MIL/uL — ABNORMAL LOW (ref 4.22–5.81)
RDW: 14.5 % (ref 11.5–15.5)
WBC: 8 10*3/uL (ref 4.0–10.5)
nRBC: 0 % (ref 0.0–0.2)

## 2020-08-01 LAB — PROCALCITONIN: Procalcitonin: 0.88 ng/mL

## 2020-08-01 LAB — MAGNESIUM: Magnesium: 2.7 mg/dL — ABNORMAL HIGH (ref 1.7–2.4)

## 2020-08-01 LAB — LEGIONELLA PNEUMOPHILA SEROGP 1 UR AG: L. pneumophila Serogp 1 Ur Ag: NEGATIVE

## 2020-08-01 MED ORDER — POLYETHYLENE GLYCOL 3350 17 G PO PACK
17.0000 g | PACK | Freq: Two times a day (BID) | ORAL | Status: DC
  Administered 2020-08-01 – 2020-08-08 (×7): 17 g
  Filled 2020-08-01 (×9): qty 1

## 2020-08-01 MED ORDER — PROPOFOL 1000 MG/100ML IV EMUL
50.0000 ug/kg/min | INTRAVENOUS | Status: DC
  Administered 2020-08-01: 10 ug/kg/min via INTRAVENOUS
  Administered 2020-08-01: 30 ug/kg/min via INTRAVENOUS
  Administered 2020-08-02 – 2020-08-03 (×4): 20 ug/kg/min via INTRAVENOUS
  Administered 2020-08-03: 50 ug/kg/min via INTRAVENOUS
  Administered 2020-08-03: 30 ug/kg/min via INTRAVENOUS
  Administered 2020-08-03: 20 ug/kg/min via INTRAVENOUS
  Administered 2020-08-03: 50 ug/kg/min via INTRAVENOUS
  Administered 2020-08-04: 55 ug/kg/min via INTRAVENOUS
  Administered 2020-08-04 (×5): 50 ug/kg/min via INTRAVENOUS
  Administered 2020-08-04: 55 ug/kg/min via INTRAVENOUS
  Administered 2020-08-05 (×3): 50 ug/kg/min via INTRAVENOUS
  Administered 2020-08-05: 55 ug/kg/min via INTRAVENOUS
  Administered 2020-08-05 (×2): 50 ug/kg/min via INTRAVENOUS
  Administered 2020-08-05: 55 ug/kg/min via INTRAVENOUS
  Administered 2020-08-06 – 2020-08-08 (×19): 50 ug/kg/min via INTRAVENOUS
  Administered 2020-08-09: 60 ug/kg/min via INTRAVENOUS
  Administered 2020-08-09 (×2): 50 ug/kg/min via INTRAVENOUS
  Filled 2020-08-01 (×4): qty 100
  Filled 2020-08-01: qty 200
  Filled 2020-08-01: qty 100
  Filled 2020-08-01: qty 200
  Filled 2020-08-01 (×28): qty 100
  Filled 2020-08-01: qty 200
  Filled 2020-08-01 (×9): qty 100

## 2020-08-01 MED ORDER — DEXTROSE 5 % IV SOLN
250.0000 mg | Freq: Once | INTRAVENOUS | Status: AC
  Administered 2020-08-01: 250 mg via INTRAVENOUS
  Filled 2020-08-01 (×2): qty 250

## 2020-08-01 MED ORDER — FUROSEMIDE 10 MG/ML IJ SOLN
40.0000 mg | Freq: Once | INTRAMUSCULAR | Status: AC
  Administered 2020-08-01: 40 mg via INTRAVENOUS
  Filled 2020-08-01: qty 4

## 2020-08-01 MED ORDER — BISACODYL 10 MG RE SUPP
10.0000 mg | Freq: Once | RECTAL | Status: AC
  Administered 2020-08-01: 10 mg via RECTAL
  Filled 2020-08-01: qty 1

## 2020-08-01 MED ORDER — FREE WATER
300.0000 mL | Status: DC
  Administered 2020-08-01 – 2020-08-03 (×12): 300 mL

## 2020-08-01 NOTE — Progress Notes (Signed)
Pharmacy Antibiotic Note  Bradley [name] is a 59 y.o. male admitted on 07/13/2020 with sepsis.  Pharmacy has been consulted for cefepime dosing. Also on Azithromycin.   Currently day #5 of therapy - down trending wbc and PCT. Tmax 100.3.  SCr bumped but is now down trending at 1.21 with estimated CrCl ~72 mL/min.   Plan: Continue Cefepime 2g IV every 8 hours Monitor renal funciton and LOT  Height: 5\' 9"  (175.3 cm) Weight: 88.9 kg (195 lb 15.8 oz) IBW/kg (Calculated) : 70.7  Temp (24hrs), Avg:99.9 F (37.7 C), Min:99.4 F (37.4 C), Max:100.4 F (38 C)  Recent Labs  Lab 07/07/2020 0840 07/27/2020 0840 07/08/2020 1820 07/25/2020 1953 07/29/20 0801 07/30/20 0703 07/31/20 0340 07/31/20 1518 08/01/20 0321  WBC 11.3*  --   --   --  20.0* 13.3* 9.3  --  8.0  CREATININE 0.86   < >  --   --  0.85 1.55* 1.46* 1.47* 1.21  LATICACIDVEN  --   --  2.6* 2.6*  --   --   --   --   --    < > = values in this interval not displayed.    Estimated Creatinine Clearance: 72.5 mL/min (by C-G formula based on SCr of 1.21 mg/dL).    Allergies  Allergen Reactions  . Wellbutrin [Bupropion] Cough    10/01/20, PharmD, BCPS, BCCCP Clinical Pharmacist Please refer to [facility] for [facility] numbers 08/01/2020 3:52 PM

## 2020-08-01 NOTE — Progress Notes (Signed)
NAME:  Bradley [name], MRN:  [id number], DOB:  07-03-1961, LOS: 4 ADMISSION DATE:  08-08-2020, CONSULTATION DATE:  07/29/20 REFERRING MD:  Jeni Salles CHIEF COMPLAINT:  Resp Distress   Brief History   59 year old ex-smoker admitted with severe acute hypoxic respiratory failure and bilateral infiltrates/community-acquired pneumonia Covid, flu negative  History of present illness   59 year old heavy ex-smoker who was seen by my partner Dr. Sherene Sires in the past, last 2017 PFTs showing isolated decrease in DLCO-felt to have a combination of emphysema noted on CT and RB ILD/fibrosis.  He was hospitalized 07/2016 for ARDS, no infectious etiology identified, autoimmune serologies were negative as except for isolated increase in RA factor, CCP negative.  He required a tracheostomy but recovered well from this illness and did not need long-term oxygen.  He has been lost to follow-up from the pulmonary clinic since then.  I note a chest x-ray in February 2018 which shows clearing of bilateral infiltrates. He now presents with 3 weeks of coughing up yellow-green sputum, no fevers, preceding URI symptoms, no sick contacts.  PCP gave him antibiotics but his dyspnea worsen until he arrived to the emergency room .  Chest x-ray independently reviewed by me shows multifocal bilateral infiltrates upper and lower lung zones.  Covid and flu testing was negative, labs showed WBC count of 11.3 with 90% neutrophils, BNP was low, troponins negative He was placed on high flow nasal cannula and then had desaturation on movement and was placed on a BiPAP overnight. 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 This is markedly worsened from 2016, reflecting acute airspace disease superimposed on the patient's fibrotic change.  Enlarged mediastinal nodes, measuring up to 1.9 cm   Venous duplex 9/29 >> negative  Echo 9/30: LV EF 55%. Interventricular septal flattening in systole and diastole consistent with RV pressure and volume overload. RV-RA gradient is severely elevated at consistent with pulmonary hypertension. RV systolic function is moderately to severely reduced. RV size mildly enlarged. No valvular issues.  CTA Chest 10/1: Negative for PE. Diffuse parenchymal ground-glass opacities bilaterally. Emphysema. Enlarged pulmonary trunk concerning for PH.   Micro Data:  Covid Negative Influenza Negative MRSA PCR Negative  Antimicrobials:  Azithromycin 2020-08-08>> Cefepime 08/08/2020>> Vancomycin 9/29/>>10/1  Interim history/subjective:  Started on inhaled NO yesterday, FiO2 decreased to 60%. Levophed weaned down slightly.   Episodes of vent dyssynchrony related to agitation requiring intermittent paralytics.   Objective   Blood pressure 115/74, pulse 89, temperature (!) 100.4 F (38 C), temperature source Axillary, resp. rate (!) 30, height 5\' 9"  (1.753 m), weight 88.9 kg, SpO2 96 %.    Vent Mode: PRVC FiO2 (%):  [60 %-70 %] 60 % Set Rate:  [30 bmp] 30 bmp Vt Set:  [490 mL] 490 mL PEEP:  [14 cmH20] 14 cmH20 Plateau Pressure:  [26 cmH20-30 cmH20] 30 cmH20   Intake/Output Summary (Last 24 hours) at 08/01/2020 0833 Last data filed at 08/01/2020 0800 Gross per 24 hour  Intake 4268.97 ml  Output  5376 ml  Net -1107.03 ml   Filed Weights   07/29/20 2228 07/31/20 0335 08/01/20 0326  Weight: 87.6 kg 88.8 kg 88.9 kg   Examination: General:  intubated, sedated, no acute distress HENT: PERRL, Sclera anicteric, ET Tube in place Lungs: course respirations. No wheezing or rhonchi. Cardiovascular: RRR, no murmurs, S1S2 Abdomen: soft, non-distended with bowel sounds present Extremities: trace edema. Lower extremities cool to touch Neuro: awakes to verbal stimuli, not following commands GU: Foley in place  Resolved Hospital Problem list     Assessment & Plan:  Acute Hypoxemic and Hypercapnic Respiratory Failure In setting of underlying pulmonary emphysema with fibrosis and pulmonary hypertension presenting with pneumonia - Continue ventilator support, P/F ratio 153 this morning - Concern for shunt physiology regarding the pneumonia and underlying lung disease - Inhaled NO started 10/2 - CTA Chest 10/2 negative for PE - Cefepime + azithromycin for pneumonia coverage - Send Respiratory Cultures - Nebs + steroids for COPD - Precedex + fentanyl for sedation. Will add propofol back on today as TGs are in the 100s. PRN versed and vecuronium. Will optimize sedation today to avoid further paralytic use.  Shock In setting of pneumonia, respiratory failure and right heart failure - Continue levophed - Continue spot lasix - Inhaled NO started 10/2 - TSH within normal limits  Diabetes Mellitus - SSI - TF coverage 3 units q4hrs - Lantus 10 units daily  Hypernatremia - FW q4hrs  Depression - hold home cymbalta  Best practice:  Diet: TF Pain/Anxiety/Delirium protocol (if indicated): PAD Protocol Ordered VAP protocol (if indicated): Ordered DVT prophylaxis: Lovenox GI prophylaxis: PPI daily Glucose control: SSI Mobility: Bed Rest Code Status: Full Family Communication:  Disposition: ICU  Labs   CBC: Recent Labs  Lab 07/01/2020 0840 07/23/2020 0840 07/29/20 0801 07/29/20 2300 07/30/20 0703 07/30/20 1005 07/31/20 0340 07/31/20 0535 08/01/20 0321  WBC 11.3*  --  20.0*  --  13.3*  --  9.3  --  8.0  NEUTROABS  10.1*  --   --   --   --   --   --   --   --   HGB 14.9   < > 15.6   < > 15.3 14.3 13.0 12.9* 11.4*  HCT 45.6   < > 48.4   < > 50.4 42.0 40.7 38.0* 37.6*  MCV 90.5  --  90.8  --  93.0  --  92.3  --  93.8  PLT 225  --  301  --  290  --  229  --  166   < > = values in this interval not displayed.    Basic Metabolic Panel: Recent Labs  Lab 07/29/20 0801 07/29/20 2300 07/30/20 0703 07/30/20 0703 07/30/20 1005 07/30/20 1151 07/31/20 0340 07/31/20 0535 07/31/20 1518 08/01/20 0321  NA 139   < > 140   < > 142  --  141 141 148* 149*  K 4.2   < > 5.0   < > 4.3  --  4.1 3.9 4.6 4.8  CL 104  --  105  --   --   --  109  --  112* 111  CO2 25  --  20*  --   --   --  21*  --  27 29  GLUCOSE 154*  --  180*  --   --   --  224*  --  174* 189*  BUN 19  --  33*  --   --   --  51*  --  62* 59*  CREATININE 0.85  --  1.55*  --   --   --  1.46*  --  1.47* 1.21  CALCIUM 8.7*  --  8.6*  --   --   --  8.3*  --  8.4* 8.0*  MG  --   --   --   --   --  2.9* 3.3*  --   --  2.7*  PHOS  --   --   --   --   --  5.9* 4.3  --  3.7  --    < > = values in this interval not displayed.   GFR: Estimated Creatinine Clearance: 72.5 mL/min (by C-G formula based on SCr of 1.21 mg/dL). Recent Labs  Lab 08/19/2020 0840 2020/08/19 1820 19-Aug-2020 1953 07/29/20 0801 07/30/20 0117 07/30/20 0703 07/31/20 0340 08/01/20 0321 08/01/20 0740  PROCALCITON  --   --   --   --  1.92  --  1.94  --  0.88  WBC   < >  --   --  20.0*  --  13.3* 9.3 8.0  --   LATICACIDVEN  --  2.6* 2.6*  --   --   --   --   --   --    < > = values in this interval not displayed.    Liver Function Tests: Recent Labs  Lab 08/19/20 0840  AST 20  ALT 15  ALKPHOS 53  BILITOT 1.0  PROT 7.4  ALBUMIN 3.3*   No results for input(s): LIPASE, AMYLASE in the last 168 hours. No results for input(s): AMMONIA in the last 168 hours.  ABG    Component Value Date/Time   PHART 7.307 (L) 07/31/2020 0535   PCO2ART 57.8 (H) 07/31/2020 0535   PO2ART 236 (H)  07/31/2020 0535   HCO3 28.9 (H) 07/31/2020 0535   TCO2 31 07/31/2020 0535   ACIDBASEDEF 4.0 (H) 07/30/2020 1005   O2SAT 100.0 07/31/2020 0535     Coagulation Profile: No results for input(s): INR, PROTIME in the last 168 hours.  Cardiac Enzymes: No results for input(s): CKTOTAL, CKMB, CKMBINDEX, TROPONINI in the last 168 hours.  HbA1C: Hgb A1c MFr Bld  Date/Time Value Ref Range Status  07/30/2020 11:51 AM 6.9 (H) 4.8 - 5.6 % Final    Comment:    (NOTE) Pre diabetes:          5.7%-6.4%  Diabetes:              >6.4%  Glycemic control for   <7.0% adults with diabetes   09/16/2016 04:40 AM 6.9 (H) 4.8 - 5.6 % Final    Comment:    (NOTE)         Pre-diabetes: 5.7 - 6.4         Diabetes: >6.4         Glycemic control for adults with diabetes: <7.0     CBG: Recent Labs  Lab 07/31/20 1538 07/31/20 1929 07/31/20 2327 08/01/20 0338 08/01/20 0709  GLUCAP 156* 169* 177* 192* 204*    Critical care time: 49     Melody Comas, MD [facility] Office: [phone]   See Amion for Pager Details

## 2020-08-02 DIAGNOSIS — A419 Sepsis, unspecified organism: Principal | ICD-10-CM

## 2020-08-02 DIAGNOSIS — R6520 Severe sepsis without septic shock: Secondary | ICD-10-CM

## 2020-08-02 LAB — CULTURE, BLOOD (ROUTINE X 2)
Culture: NO GROWTH
Culture: NO GROWTH

## 2020-08-02 LAB — POCT I-STAT 7, (LYTES, BLD GAS, ICA,H+H)
Acid-Base Excess: 12 mmol/L — ABNORMAL HIGH (ref 0.0–2.0)
Bicarbonate: 39 mmol/L — ABNORMAL HIGH (ref 20.0–28.0)
Calcium, Ion: 1.29 mmol/L (ref 1.15–1.40)
HCT: 40 % (ref 39.0–52.0)
Hemoglobin: 13.6 g/dL (ref 13.0–17.0)
O2 Saturation: 95 %
Patient temperature: 98.7
Potassium: 5.1 mmol/L (ref 3.5–5.1)
Sodium: 147 mmol/L — ABNORMAL HIGH (ref 135–145)
TCO2: 41 mmol/L — ABNORMAL HIGH (ref 22–32)
pCO2 arterial: 60.1 mmHg — ABNORMAL HIGH (ref 32.0–48.0)
pH, Arterial: 7.42 (ref 7.350–7.450)
pO2, Arterial: 79 mmHg — ABNORMAL LOW (ref 83.0–108.0)

## 2020-08-02 LAB — GLUCOSE, CAPILLARY
Glucose-Capillary: 201 mg/dL — ABNORMAL HIGH (ref 70–99)
Glucose-Capillary: 185 mg/dL — ABNORMAL HIGH (ref 70–99)
Glucose-Capillary: 239 mg/dL — ABNORMAL HIGH (ref 70–99)
Glucose-Capillary: 152 mg/dL — ABNORMAL HIGH (ref 70–99)
Glucose-Capillary: 141 mg/dL — ABNORMAL HIGH (ref 70–99)
Glucose-Capillary: 185 mg/dL — ABNORMAL HIGH (ref 70–99)

## 2020-08-02 LAB — BASIC METABOLIC PANEL
Anion gap: 8 (ref 5–15)
BUN: 56 mg/dL — ABNORMAL HIGH (ref 6–20)
CO2: 33 mmol/L — ABNORMAL HIGH (ref 22–32)
Calcium: 8.5 mg/dL — ABNORMAL LOW (ref 8.9–10.3)
Chloride: 105 mmol/L (ref 98–111)
Creatinine, Ser: 1.05 mg/dL (ref 0.61–1.24)
GFR calc Af Amer: >60 mL/min (ref >60)
GFR calc non Af Amer: >60 mL/min (ref >60)
Glucose, Bld: 224 mg/dL — ABNORMAL HIGH (ref 70–99)
Potassium: 4.7 mmol/L (ref 3.5–5.1)
Sodium: 146 mmol/L — ABNORMAL HIGH (ref 135–145)

## 2020-08-02 LAB — CULTURE, RESPIRATORY W GRAM STAIN

## 2020-08-02 LAB — CBC
HCT: 39.1 % (ref 39.0–52.0)
Hemoglobin: 12 g/dL — ABNORMAL LOW (ref 13.0–17.0)
MCH: 28.6 pg (ref 26.0–34.0)
MCHC: 30.7 g/dL (ref 30.0–36.0)
MCV: 93.1 fL (ref 80.0–100.0)
Platelets: 172 10*3/uL (ref 150–400)
RBC: 4.2 MIL/uL — ABNORMAL LOW (ref 4.22–5.81)
RDW: 14.2 % (ref 11.5–15.5)
WBC: 9.2 10*3/uL (ref 4.0–10.5)
nRBC: 0.2 % (ref 0.0–0.2)

## 2020-08-02 LAB — MAGNESIUM: Magnesium: 2.7 mg/dL — ABNORMAL HIGH (ref 1.7–2.4)

## 2020-08-02 MED ORDER — FUROSEMIDE 10 MG/ML IJ SOLN
40.0000 mg | Freq: Once | INTRAMUSCULAR | Status: AC
  Administered 2020-08-02: 40 mg via INTRAVENOUS
  Filled 2020-08-02: qty 4

## 2020-08-02 MED ORDER — INSULIN GLARGINE 100 UNIT/ML ~~LOC~~ SOLN
15.0000 [IU] | Freq: Every day | SUBCUTANEOUS | Status: DC
  Administered 2020-08-02 – 2020-08-03 (×2): 15 [IU] via SUBCUTANEOUS
  Filled 2020-08-02 (×3): qty 0.15

## 2020-08-02 MED ORDER — METHYLPREDNISOLONE SODIUM SUCC 40 MG IJ SOLR
40.0000 mg | Freq: Two times a day (BID) | INTRAMUSCULAR | Status: DC
  Administered 2020-08-02 – 2020-08-05 (×6): 40 mg via INTRAVENOUS
  Filled 2020-08-02 (×7): qty 1

## 2020-08-02 MED ORDER — INSULIN ASPART 100 UNIT/ML ~~LOC~~ SOLN
4.0000 [IU] | SUBCUTANEOUS | Status: DC
  Administered 2020-08-02 – 2020-08-05 (×18): 4 [IU] via SUBCUTANEOUS

## 2020-08-02 NOTE — Progress Notes (Signed)
Pt weaned to iNO 4PPM. RT will continue to monitor.

## 2020-08-02 NOTE — Progress Notes (Signed)
Pt weaned to iNO 10PPM. RT will continue to monitor.

## 2020-08-02 NOTE — Progress Notes (Signed)
Patients INO weaned to 2PPM

## 2020-08-02 NOTE — Progress Notes (Signed)
NAME:  Bradley [name], MRN:  [id number], DOB:  1961-03-22, LOS: 5 ADMISSION DATE:  07/21/2020, CONSULTATION DATE:  07/29/20 REFERRING MD:  Jeni Salles CHIEF COMPLAINT:  Resp Distress   Brief History   59 year old ex-smoker admitted with severe acute hypoxic respiratory failure and bilateral infiltrates/community-acquired pneumonia Covid and flu negative.  History of present illness   59 year old heavy ex-smoker who was seen by my partner Dr. Sherene Sires in the past, last 2017 PFTs showing isolated decrease in DLCO-felt to have a combination of emphysema noted on CT and RB ILD/fibrosis.  He was hospitalized 07/2016 for ARDS, no infectious etiology identified, autoimmune serologies were negative as except for isolated increase in RA factor, CCP negative.  He required a tracheostomy but recovered well from this illness and did not need long-term oxygen.  He has been lost to follow-up from the pulmonary clinic since then.  I note a chest x-ray in February 2018 which shows clearing of bilateral infiltrates. He now presents with 3 weeks of coughing up yellow-green sputum, no fevers, preceding URI symptoms, no sick contacts.  PCP gave him antibiotics but his dyspnea worsen until he arrived to the emergency room .  Chest x-ray independently reviewed by me shows multifocal bilateral infiltrates upper and lower lung zones.  Covid and flu testing was negative, labs showed WBC count of 11.3 with 90% neutrophils, BNP was low, troponins negative He was placed on high flow nasal cannula and then had desaturation on movement and was placed on a BiPAP overnight. 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 This is markedly worsened from 2016, reflecting acute airspace disease superimposed on the patient's fibrotic change.  Enlarged mediastinal nodes, measuring up to 1.9 cm   Venous duplex 9/29 >> negative  Echo 9/30: LV EF 55%. Interventricular septal flattening in systole and diastole consistent with RV pressure and volume overload. RV-RA gradient is severely elevated at consistent with pulmonary hypertension. RV systolic function is moderately to severely reduced. RV size mildly enlarged. No valvular issues.  CTA Chest 10/1: Negative for PE. Diffuse parenchymal ground-glass opacities bilaterally. Emphysema. Enlarged pulmonary trunk concerning for PH.   Micro Data:  Covid Negative Influenza Negative MRSA PCR Negative  Antimicrobials:  Azithromycin 07/08/2020>> Cefepime 07/22/2020>> Vancomycin 9/29/>>10/1  Interim history/subjective:  Remains on inhaled NO since 10/2. FiO2 slowly weaning down to 50% now. No acute events overnight. Does frequently become dyssynchronous requiring sedative and sometimes paralytic boluses.    Objective   Blood pressure (!) 143/98, pulse 98, temperature 98.9 F (37.2 C), temperature source Axillary, resp. rate (!) 23, height 5\' 9"  (1.753 m), weight 86.8 kg, SpO2 94 %.    Vent Mode: PRVC FiO2 (%):  [50 %-60 %] 50 % Set Rate:  [30 bmp] 30 bmp Vt Set:  [460 mL-490 mL] 460 mL PEEP:  [14 cmH20] 14 cmH20 Plateau Pressure:  [33 cmH20] 33 cmH20   Intake/Output Summary (Last 24 hours) at 08/02/2020 0741 Last data filed at 08/02/2020 0711 Gross per 24 hour  Intake  5897.43 ml  Output 6150 ml  Net -252.57 ml   Filed Weights   07/31/20 0335 08/01/20 0326 08/02/20 0345  Weight: 88.8 kg 88.9 kg 86.8 kg   Examination:    General: Middle aged male on vent, diaphoretic.  HENT: PERRL, no JVD. [place]/AT Lungs:Coarse bilaterally. No wheeze.  Cardiovascular: RRR, no MRG Abdomen: Soft, non-distended.  Extremities: No acute deformity Neuro: sedated GU: Foley in place  Resolved Hospital Problem list     Assessment & Plan:   Acute Hypoxemic and Hypercapnic Respiratory Failure: In setting of underlying pulmonary emphysema with fibrosis and pulmonary hypertension presenting with pneumonia. Concern for shunt physiology regarding the pneumonia and underlying lung disease - Full vent support.  - Inhaled NO 20ppm, if he tolerates FiO2 wean to 50%, may be reasonable to wean this to 10ppm.  - Cefepime + azithromycin for pneumonia coverage - Send Respiratory Cultures - Nebs COPD - Wean solu-medrol from 40mg  q8 to 40mg  BID - Precedex, precedex, propofol per PAD protocol with RASS goal -4 to -5. PRN paralytics have been needed occasionally.  - Diuresis as tolerated (40mg  today)  Shock In setting of pneumonia, respiratory failure and right heart failure - Continue levophed for MAP goal 65mmHg - Continue spot lasix  Diabetes Mellitus - SSI - TF coverage to 4 units q4hrs - Lantus 10 units daily  Hypernatremia - FW 300mL q4hrs  Depression - hold home cymbalta  Best practice:  Diet: TF Pain/Anxiety/Delirium protocol (if indicated): PAD Protocol Ordered VAP protocol (if indicated): Ordered DVT prophylaxis: Lovenox GI prophylaxis: PPI daily Glucose control: SSI Mobility: Bed Rest Code Status: Full Family Communication: Message left for wife.  Disposition: ICU  Labs   CBC: Recent Labs  Lab 07/11/2020 0840 07/12/2020 0840 07/29/20 0801 07/29/20 2300 07/30/20 0703 07/30/20 1005 07/31/20 0340 07/31/20 0340 07/31/20 0535 08/01/20 0321 08/01/20 0835 08/02/20 0340 08/02/20 0503  WBC 11.3*   < > 20.0*  --  13.3*  --  9.3  --   --  8.0  --  9.2  --   NEUTROABS 10.1*  --   --   --   --   --   --   --   --    --   --   --   --   HGB 14.9   < > 15.6   < > 15.3   < > 13.0   < > 12.9* 11.4* 12.2* 12.0* 13.6  HCT 45.6   < > 48.4   < > 50.4   < > 40.7   < > 38.0* 37.6* 36.0* 39.1 40.0  MCV 90.5   < > 90.8  --  93.0  --  92.3  --   --  93.8  --  93.1  --   PLT 225   < > 301  --  290  --  229  --   --  166  --  172  --    < > = values in this interval not displayed.    Basic Metabolic Panel: Recent Labs  Lab 07/30/20 0703 07/30/20 1005 07/30/20 1151 07/31/20 0340 07/31/20 0535 07/31/20 1518 08/01/20 0321 08/01/20 0835 08/02/20 0340 08/02/20 0503  NA 140   < >  --  141   < > 148* 149* 147* 146* 147*  K 5.0   < >  --  4.1   < > 4.6 4.8 5.2* 4.7 5.1  CL 105  --   --  109  --  112*  111  --  105  --   CO2 20*  --   --  21*  --  27 29  --  33*  --   GLUCOSE 180*  --   --  224*  --  174* 189*  --  224*  --   BUN 33*  --   --  51*  --  62* 59*  --  56*  --   CREATININE 1.55*  --   --  1.46*  --  1.47* 1.21  --  1.05  --   CALCIUM 8.6*  --   --  8.3*  --  8.4* 8.0*  --  8.5*  --   MG  --   --  2.9* 3.3*  --   --  2.7*  --  2.7*  --   PHOS  --   --  5.9* 4.3  --  3.7  --   --   --   --    < > = values in this interval not displayed.   GFR: Estimated Creatinine Clearance: 82.6 mL/min (by C-G formula based on SCr of 1.05 mg/dL). Recent Labs  Lab 17-Aug-2020 1820 Aug 17, 2020 1953 07/29/20 0801 07/30/20 0117 07/30/20 0703 07/31/20 0340 08/01/20 0321 08/01/20 0740 08/02/20 0340  PROCALCITON  --   --   --  1.92  --  1.94  --  0.88  --   WBC  --   --    < >  --  13.3* 9.3 8.0  --  9.2  LATICACIDVEN 2.6* 2.6*  --   --   --   --   --   --   --    < > = values in this interval not displayed.    Liver Function Tests: Recent Labs  Lab 17-Aug-2020 0840  AST 20  ALT 15  ALKPHOS 53  BILITOT 1.0  PROT 7.4  ALBUMIN 3.3*   No results for input(s): LIPASE, AMYLASE in the last 168 hours. No results for input(s): AMMONIA in the last 168 hours.  ABG    Component Value Date/Time   PHART 7.420  08/02/2020 0503   PCO2ART 60.1 (H) 08/02/2020 0503   PO2ART 79 (L) 08/02/2020 0503   HCO3 39.0 (H) 08/02/2020 0503   TCO2 41 (H) 08/02/2020 0503   ACIDBASEDEF 4.0 (H) 07/30/2020 1005   O2SAT 95.0 08/02/2020 0503     Coagulation Profile: No results for input(s): INR, PROTIME in the last 168 hours.  Cardiac Enzymes: No results for input(s): CKTOTAL, CKMB, CKMBINDEX, TROPONINI in the last 168 hours.  HbA1C: Hgb A1c MFr Bld  Date/Time Value Ref Range Status  07/30/2020 11:51 AM 6.9 (H) 4.8 - 5.6 % Final    Comment:    (NOTE) Pre diabetes:          5.7%-6.4%  Diabetes:              >6.4%  Glycemic control for   <7.0% adults with diabetes   09/16/2016 04:40 AM 6.9 (H) 4.8 - 5.6 % Final    Comment:    (NOTE)         Pre-diabetes: 5.7 - 6.4         Diabetes: >6.4         Glycemic control for adults with diabetes: <7.0     CBG: Recent Labs  Lab 08/01/20 1518 08/01/20 1925 08/01/20 2328 08/02/20 0334 08/02/20 0712  GLUCAP 125* 216* 210* 201* 185*    Critical care time: 45  minutes     Joneen Roach, AGACNP-BC [facility]  See Amion for personal pager PCCM on call pager [phone]  08/02/2020 7:47 AM

## 2020-08-02 NOTE — Progress Notes (Signed)
Pt weaned to iNO 5PPM. RT will continue to monitor.

## 2020-08-02 NOTE — Progress Notes (Signed)
iNO weaned per verbal order from Dr. Kendrick Fries. Pt on 15ppm.  RT will continue to monitor.

## 2020-08-02 NOTE — Progress Notes (Signed)
Weaned patients INO to 3PPM. RT will continue to wean.

## 2020-08-03 DIAGNOSIS — E119 Type 2 diabetes mellitus without complications: Secondary | ICD-10-CM

## 2020-08-03 LAB — POCT I-STAT 7, (LYTES, BLD GAS, ICA,H+H)
Acid-Base Excess: 12 mmol/L — ABNORMAL HIGH (ref 0.0–2.0)
Bicarbonate: 41.3 mmol/L — ABNORMAL HIGH (ref 20.0–28.0)
Calcium, Ion: 1.34 mmol/L (ref 1.15–1.40)
HCT: 43 % (ref 39.0–52.0)
Hemoglobin: 14.6 g/dL (ref 13.0–17.0)
O2 Saturation: 94 %
Patient temperature: 98
Potassium: 4.6 mmol/L (ref 3.5–5.1)
Sodium: 147 mmol/L — ABNORMAL HIGH (ref 135–145)
TCO2: 43 mmol/L — ABNORMAL HIGH (ref 22–32)
pCO2 arterial: 72.2 mmHg (ref 32.0–48.0)
pH, Arterial: 7.363 (ref 7.350–7.450)
pO2, Arterial: 75 mmHg — ABNORMAL LOW (ref 83.0–108.0)

## 2020-08-03 LAB — BASIC METABOLIC PANEL
Anion gap: 10 (ref 5–15)
BUN: 52 mg/dL — ABNORMAL HIGH (ref 6–20)
CO2: 34 mmol/L — ABNORMAL HIGH (ref 22–32)
Calcium: 9.1 mg/dL (ref 8.9–10.3)
Chloride: 105 mmol/L (ref 98–111)
Creatinine, Ser: 0.94 mg/dL (ref 0.61–1.24)
GFR calc Af Amer: >60 mL/min (ref >60)
GFR calc non Af Amer: >60 mL/min (ref >60)
Glucose, Bld: 157 mg/dL — ABNORMAL HIGH (ref 70–99)
Potassium: 4.4 mmol/L (ref 3.5–5.1)
Sodium: 149 mmol/L — ABNORMAL HIGH (ref 135–145)

## 2020-08-03 LAB — CBC
HCT: 41.2 % (ref 39.0–52.0)
Hemoglobin: 13.1 g/dL (ref 13.0–17.0)
MCH: 29.6 pg (ref 26.0–34.0)
MCHC: 31.8 g/dL (ref 30.0–36.0)
MCV: 93 fL (ref 80.0–100.0)
Platelets: 214 10*3/uL (ref 150–400)
RBC: 4.43 MIL/uL (ref 4.22–5.81)
RDW: 14 % (ref 11.5–15.5)
WBC: 12.5 10*3/uL — ABNORMAL HIGH (ref 4.0–10.5)
nRBC: 0 % (ref 0.0–0.2)

## 2020-08-03 LAB — GLUCOSE, CAPILLARY
Glucose-Capillary: 118 mg/dL — ABNORMAL HIGH (ref 70–99)
Glucose-Capillary: 128 mg/dL — ABNORMAL HIGH (ref 70–99)
Glucose-Capillary: 168 mg/dL — ABNORMAL HIGH (ref 70–99)
Glucose-Capillary: 175 mg/dL — ABNORMAL HIGH (ref 70–99)
Glucose-Capillary: 196 mg/dL — ABNORMAL HIGH (ref 70–99)
Glucose-Capillary: 203 mg/dL — ABNORMAL HIGH (ref 70–99)

## 2020-08-03 LAB — MAGNESIUM: Magnesium: 2.6 mg/dL — ABNORMAL HIGH (ref 1.7–2.4)

## 2020-08-03 MED ORDER — FUROSEMIDE 10 MG/ML IJ SOLN
40.0000 mg | Freq: Once | INTRAMUSCULAR | Status: AC
  Administered 2020-08-03: 40 mg via INTRAVENOUS
  Filled 2020-08-03: qty 4

## 2020-08-03 MED ORDER — FREE WATER
300.0000 mL | Status: DC
  Administered 2020-08-03 – 2020-08-06 (×27): 300 mL

## 2020-08-03 NOTE — Progress Notes (Signed)
Patient completely weaned from INO.

## 2020-08-03 NOTE — Progress Notes (Signed)
CSW made a referral to [place] in financial counseling for this patient as he is uninsured.   Edwin Dada, MSW, LCSW-A Transitions of Care  Clinical Social Worker  [facility] Emergency Departments  Medical ICU [phone]

## 2020-08-03 NOTE — Progress Notes (Signed)
Patients INO weaned to 1PPM.

## 2020-08-03 NOTE — Progress Notes (Addendum)
NAME:  Bradley [name], MRN:  [id number], DOB:  1961/10/16, LOS: 6 ADMISSION DATE:  07/10/2020, CONSULTATION DATE:  07/29/20 REFERRING MD:  Jeni SallesMedra, TRH CHIEF COMPLAINT:  Resp Distress   Brief History   59 year old ex-smoker admitted with severe acute hypoxic respiratory failure and bilateral infiltrates/community-acquired pneumonia Covid and flu negative.  History of present illness   59 year old heavy ex-smoker who was seen by my partner Dr. Sherene SiresWert in the past, last 2017 PFTs showing isolated decrease in DLCO-felt to have a combination of emphysema noted on CT and RB ILD/fibrosis.  He was hospitalized 07/2016 for ARDS, no infectious etiology identified, autoimmune serologies were negative as except for isolated increase in RA factor, CCP negative.  He required a tracheostomy but recovered well from this illness and did not need long-term oxygen.  He has been lost to follow-up from the pulmonary clinic since then.  I note a chest x-ray in February 2018 which shows clearing of bilateral infiltrates. He now presents with 3 weeks of coughing up yellow-green sputum, no fevers, preceding URI symptoms, no sick contacts.  PCP gave him antibiotics but his dyspnea worsen until he arrived to the emergency room .  Chest x-ray independently reviewed by me shows multifocal bilateral infiltrates upper and lower lung zones.  Covid and flu testing was negative, labs showed WBC count of 11.3 with 90% neutrophils, BNP was low, troponins negative He was placed on high flow nasal cannula and then had desaturation on movement and was placed on a BiPAP overnight. 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 This is markedly worsened from 2016, reflecting acute airspace disease superimposed on the patient's fibrotic change.  Enlarged mediastinal nodes, measuring up to 1.9 cm   Venous duplex 9/29 >> negative  Echo 9/30: LV EF 55%. Interventricular septal flattening in systole and diastole consistent with RV pressure and volume overload. RV-RA gradient is severely elevated at 68mmHg consistent with pulmonary hypertension. RV systolic function is moderately to severely reduced. RV size mildly enlarged. No valvular issues.  CTA Chest 10/1: Negative for PE. Diffuse parenchymal ground-glass opacities bilaterally. Emphysema. Enlarged pulmonary trunk concerning for PH.   Micro Data:  Covid Negative Influenza Negative MRSA PCR Negative Sputume 10/1 > few candida tropicalis  Antimicrobials:  Azithromycin 07/12/2020> 10/4 Cefepime 07/12/2020> 10/6 Vancomycin 9/29/>>10/1  Interim history/subjective:  Weaned off NO overnight. Still requiring multiple sedatives and frequently becomes dyssynchronous and desaturates, has required paralytic pushes overnight.   Objective   Blood pressure (!) 166/110, pulse (!) 114, temperature 99.8 F (37.7 C), temperature source Oral, resp. rate 18, height 5\' 9"  (1.753 m), weight 84.2 kg, SpO2 99 %.    Vent Mode: PRVC FiO2 (%):  [50 %] 50 % Set Rate:  [30 bmp] 30 bmp Vt Set:  [490 mL] 490 mL PEEP:  [12 cmH20-14 cmH20] 12 cmH20 Plateau Pressure:  [13 cmH20-37 cmH20] 20 cmH20   Intake/Output Summary (Last 24 hours) at 08/03/2020 0812 Last data filed at 08/03/2020 0723 Gross per 24 hour  Intake 4733.64  ml  Output 4235 ml  Net 498.64 ml   Filed Weights   08/01/20 0326 08/02/20 0345 08/03/20 0331  Weight: 88.9 kg 86.8 kg 84.2 kg    Examination:  General: Middle aged male on vent HENT: PERRL, no JVD, [place]/AT Lungs Coarse bilateral breath sounds Cardiovascular: RRR, no MRG Abdomen: Soft, non-distended, non-tender.  Extremities: No acute deformity Neuro: sedated GU: Foley in place  Resolved Hospital Problem list     Assessment & Plan:   Acute Hypoxemic and Hypercapnic Respiratory Failure: In setting of underlying pulmonary emphysema with fibrosis and pulmonary hypertension presenting with pneumonia. Concern for shunt physiology regarding the pneumonia and underlying lung disease - Full vent support, no changes to vent made this morning based on ABG - Inhaled NO weaned to off.  - Narrowed to cefepime. Stop date 10/6 - Brovana, budesonide, yupelri - Solu-medrol  40mg  BID - Precedex, propofol, fentanyl per PAD protocol with RASS goal -4 to -5. PRN paralytics have been needed occasionally. Will attempt to wean off Precedex in favor of propofol.  - Diuresis as tolerated (40mg  again today) Diuresing well and tolerating  Shock In setting of pneumonia, respiratory failure and right heart failure - Continue levophed for MAP goal (currently requiring only ) - Continue spot lasix  Diabetes Mellitus - SSI - TF coverage 4 units q4hrs - Lantus 15 units daily  Hypernatremia - Increase FW q3hrs  Depression - hold home cymbalta  Best practice:  Diet: TF Pain/Anxiety/Delirium protocol (if indicated): PAD Protocol Ordered VAP protocol (if indicated): Ordered DVT prophylaxis: Lovenox GI prophylaxis: PPI daily Glucose control: SSI Mobility: Bed Rest Code Status: Full Family Communication:  Message left for wife 10/5, spoke to daughter. Wife is quite ill so please call daughter for updates. Disposition: ICU  Labs   CBC: Recent Labs  Lab 08-17-20 0840 07/29/20 0801 07/30/20 0703 07/30/20 1005 07/31/20 0340 07/31/20 0535 08/01/20 0321 08/01/20 0321 08/01/20 10/01/20 08/02/20 0340  08/02/20 0503 08/03/20 0333 08/03/20 0434  WBC 11.3*   < > 13.3*  --  9.3  --  8.0  --   --  9.2  --  12.5*  --   NEUTROABS 10.1*  --   --   --   --   --   --   --   --   --   --   --   --   HGB 14.9   < > 15.3   < > 13.0   < > 11.4*   < > 12.2* 12.0* 13.6 13.1 14.6  HCT 45.6   < > 50.4   < > 40.7   < > 37.6*   < > 36.0* 39.1 40.0 41.2 43.0  MCV 90.5   < > 93.0  --  92.3  --  93.8  --   --  93.1  --  93.0  --   PLT 225   < > 290  --  229  --  166  --   --  172  --  214  --    < > = values in this interval not displayed.    Basic Metabolic Panel: Recent Labs  Lab 07/30/20 0703 07/30/20 1151 07/31/20 0340 07/31/20 0535 07/31/20 1518 07/31/20 1518 08/01/20 0321 08/01/20 0321 08/01/20 10/01/20 08/02/20 0340 08/02/20 0503 08/03/20 0333 08/03/20 0434  NA   < >  --  141   < > 148*   < > 149*   < > 147* 146* 147* 149* 147*  K   < >  --  4.1   < > 4.6   < > 4.8   < > 5.2* 4.7 5.1 4.4 4.6  CL   < >  --  109  --  112*  --  111  --   --  105  --  105  --   CO2   < >  --  21*  --  27  --  29  --   --  33*  --  34*  --   GLUCOSE   < >  --  224*  --  174*  --  189*  --   --  224*  --  157*  --   BUN   < >  --  51*  --  62*  --  59*  --   --  56*  --  52*  --   CREATININE   < >  --  1.46*  --  1.47*  --  1.21  --   --  1.05  --  0.94  --   CALCIUM   < >  --  8.3*  --  8.4*  --  8.0*  --   --  8.5*  --  9.1  --   MG  --  2.9* 3.3*  --   --   --  2.7*  --   --  2.7*  --  2.6*  --   PHOS  --  5.9* 4.3  --  3.7  --   --   --   --   --   --   --   --    < > = values in this interval not displayed.   GFR: Estimated Creatinine Clearance: 84.6 mL/min (by C-G formula based on SCr of 0.94 mg/dL). Recent Labs  Lab 08-12-20 1820 08/12/20 1953 07/29/20 0801 07/30/20 0117 07/30/20 0703 07/31/20 0340 08/01/20 0321 08/01/20 0740 08/02/20 0340 08/03/20 0333  PROCALCITON  --   --   --  1.92  --  1.94  --  0.88  --   --   WBC  --   --    < >  --    < > 9.3 8.0  --  9.2 12.5*  LATICACIDVEN 2.6* 2.6*   --   --   --   --   --   --   --   --    < > = values in this interval not displayed.    Liver Function Tests: Recent Labs  Lab 2020/08/12 0840  AST 20  ALT 15  ALKPHOS 53  BILITOT 1.0  PROT 7.4  ALBUMIN 3.3*   No results for input(s): LIPASE, AMYLASE in the last 168 hours. No results for input(s): AMMONIA in the last 168 hours.  ABG    Component Value Date/Time   PHART 7.363 08/03/2020 0434   PCO2ART 72.2 (HH) 08/03/2020 0434   PO2ART 75 (L) 08/03/2020 0434   HCO3 41.3 (H) 08/03/2020 0434   TCO2 43 (H) 08/03/2020 0434   ACIDBASEDEF 4.0 (H) 07/30/2020 1005   O2SAT 94.0 08/03/2020 0434     Coagulation Profile: No results for input(s): INR, PROTIME in the last 168 hours.  Cardiac Enzymes: No results for input(s): CKTOTAL, CKMB, CKMBINDEX, TROPONINI in the last 168 hours.  HbA1C: Hgb A1c MFr Bld  Date/Time Value Ref Range Status  07/30/2020 11:51 AM 6.9 (H) 4.8 - 5.6 % Final    Comment:    (  NOTE) Pre diabetes:          5.7%-6.4%  Diabetes:              >6.4%  Glycemic control for   <7.0% adults with diabetes   09/16/2016 04:40 AM 6.9 (H) 4.8 - 5.6 % Final    Comment:    (NOTE)         Pre-diabetes: 5.7 - 6.4         Diabetes: >6.4         Glycemic control for adults with diabetes: <7.0     CBG: Recent Labs  Lab 08/02/20 1458 08/02/20 1911 08/02/20 2305 08/03/20 0305 08/03/20 0705  GLUCAP 152* 141* 185* 128* 168*    Critical care time: 45 minutes     Joneen Roach, AGACNP-BC [facility]  See Amion for personal pager PCCM on call pager [phone]  08/03/2020 8:12 AM

## 2020-08-03 NOTE — Progress Notes (Signed)
eLink Physician-Brief Progress Note Patient Name: Bradley [name] DOB: May 29, 1961 MRN: [id number]   Date of Service  08/03/2020  HPI/Events of Note  ABG on 50%/PRVC 30/TV 490/P 12 = 7.363/72.2/75.  eICU Interventions  Continue present ventilator management.      Intervention Category Major Interventions: Respiratory failure - evaluation and management  Merel Santoli [place] 08/03/2020, 4:45 AM

## 2020-08-04 ENCOUNTER — Inpatient Hospital Stay (HOSPITAL_COMMUNITY): Payer: Self-pay

## 2020-08-04 DIAGNOSIS — Z8709 Personal history of other diseases of the respiratory system: Secondary | ICD-10-CM

## 2020-08-04 LAB — GLUCOSE, CAPILLARY
Glucose-Capillary: 147 mg/dL — ABNORMAL HIGH (ref 70–99)
Glucose-Capillary: 148 mg/dL — ABNORMAL HIGH (ref 70–99)
Glucose-Capillary: 164 mg/dL — ABNORMAL HIGH (ref 70–99)
Glucose-Capillary: 147 mg/dL — ABNORMAL HIGH (ref 70–99)
Glucose-Capillary: 146 mg/dL — ABNORMAL HIGH (ref 70–99)
Glucose-Capillary: 169 mg/dL — ABNORMAL HIGH (ref 70–99)

## 2020-08-04 LAB — BASIC METABOLIC PANEL
Anion gap: 9 (ref 5–15)
BUN: 54 mg/dL — ABNORMAL HIGH (ref 6–20)
CO2: 34 mmol/L — ABNORMAL HIGH (ref 22–32)
Calcium: 9.2 mg/dL (ref 8.9–10.3)
Chloride: 100 mmol/L (ref 98–111)
Creatinine, Ser: 0.8 mg/dL (ref 0.61–1.24)
GFR calc non Af Amer: >60 mL/min (ref >60)
Glucose, Bld: 152 mg/dL — ABNORMAL HIGH (ref 70–99)
Potassium: 4.5 mmol/L (ref 3.5–5.1)
Sodium: 143 mmol/L (ref 135–145)

## 2020-08-04 LAB — TRIGLYCERIDES: Triglycerides: 625 mg/dL — ABNORMAL HIGH (ref <150)

## 2020-08-04 LAB — CBC
HCT: 43.1 % (ref 39.0–52.0)
Hemoglobin: 13.4 g/dL (ref 13.0–17.0)
MCH: 28.9 pg (ref 26.0–34.0)
MCHC: 31.1 g/dL (ref 30.0–36.0)
MCV: 92.9 fL (ref 80.0–100.0)
Platelets: 220 10*3/uL (ref 150–400)
RBC: 4.64 MIL/uL (ref 4.22–5.81)
RDW: 14.2 % (ref 11.5–15.5)
WBC: 16.3 10*3/uL — ABNORMAL HIGH (ref 4.0–10.5)
nRBC: 0 % (ref 0.0–0.2)

## 2020-08-04 LAB — POCT I-STAT 7, (LYTES, BLD GAS, ICA,H+H)
Acid-Base Excess: 15 mmol/L — ABNORMAL HIGH (ref 0.0–2.0)
Bicarbonate: 43.6 mmol/L — ABNORMAL HIGH (ref 20.0–28.0)
Calcium, Ion: 1.31 mmol/L (ref 1.15–1.40)
HCT: 42 % (ref 39.0–52.0)
Hemoglobin: 14.3 g/dL (ref 13.0–17.0)
O2 Saturation: 95 %
Patient temperature: 98
Potassium: 4.3 mmol/L (ref 3.5–5.1)
Sodium: 145 mmol/L (ref 135–145)
TCO2: 46 mmol/L — ABNORMAL HIGH (ref 22–32)
pCO2 arterial: 69.4 mmHg (ref 32.0–48.0)
pH, Arterial: 7.404 (ref 7.350–7.450)
pO2, Arterial: 80 mmHg — ABNORMAL LOW (ref 83.0–108.0)

## 2020-08-04 LAB — MAGNESIUM: Magnesium: 2.6 mg/dL — ABNORMAL HIGH (ref 1.7–2.4)

## 2020-08-04 LAB — PHOSPHORUS: Phosphorus: 4.6 mg/dL (ref 2.5–4.6)

## 2020-08-04 MED ORDER — INSULIN GLARGINE 100 UNIT/ML ~~LOC~~ SOLN
17.0000 [IU] | Freq: Every day | SUBCUTANEOUS | Status: DC
  Administered 2020-08-04 – 2020-08-07 (×4): 17 [IU] via SUBCUTANEOUS
  Filled 2020-08-04 (×6): qty 0.17

## 2020-08-04 NOTE — Progress Notes (Addendum)
In and out cath X1 850cc's of clear, yellow, urine obtained.

## 2020-08-04 NOTE — Progress Notes (Signed)
NAME:  Bradley [name], MRN:  [id number], DOB:  [birth year], LOS: 7 ADMISSION DATE:  07/13/2020, CONSULTATION DATE:  07/29/20 REFERRING MD:  Jeni Salles CHIEF COMPLAINT:  Resp Distress   Brief History   59 year old, ex-smoker, admitted 9/29 with severe acute hypoxic respiratory failure and bilateral infiltrates/community-acquired pneumonia. Covid and flu negative.  History of present illness   59 year old heavy ex-smoker who was seen by my partner Dr. Sherene Sires in the past, last 2017 PFTs showing isolated decrease in DLCO-felt to have a combination of emphysema noted on CT and RB ILD/fibrosis.  He was hospitalized 07/2016 for ARDS, no infectious etiology identified, autoimmune serologies were negative as except for isolated increase in RA factor, CCP negative. He required a tracheostomy but recovered well from this illness and did not need long-term oxygen.  He has been lost to follow-up from the pulmonary clinic since then.  I note a chest x-ray in February 2018 which shows clearing of bilateral infiltrates. He now presents with 3 weeks of coughing up yellow-green sputum, no fevers, preceding URI symptoms, no sick contacts.  PCP gave him antibiotics but his dyspnea worsen until he arrived to the emergency room .  Chest x-ray independently reviewed by me shows multifocal bilateral infiltrates upper and lower lung zones.  Covid and flu testing was negative, labs showed WBC count of 11.3 with 90% neutrophils, BNP was low, troponins negative He was placed on high flow nasal cannula and then had desaturation on movement and was placed on a BiPAP overnight. PCCM asked to consult  Past Medical History  Emphysema/Pulmonary Fibrosis Anxiety Kidney Stone Pneumonia Diabetes Mellitus Type II  Significant Hospital Events   9/29 Admit  9/30 Intubated   10/02 Inhaled NO initiated 10/05 Weaned off NO overnight. 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 This is markedly worsened from 2016, reflecting acute airspace disease superimposed on the patient's fibrotic change. Enlarged mediastinal nodes, measuring up to 1.9 cm   LE Venous duplex 9/29 >> negative Echo 9/30 >> LV EF 55%. Interventricular septal flattening in systole and diastole consistent with RV pressure and volume overload. RV-RA gradient is severely elevated at consistent with pulmonary hypertension. RV systolic function is moderately to severely reduced. RV size mildly enlarged. No valvular issues. CTA Chest 10/1 >> Negative for PE. Diffuse parenchymal ground-glass opacities bilaterally. Emphysema. Enlarged pulmonary trunk concerning for PH.   Micro Data:  Covid 9/29 >> Negative Influenza 9/29 >> Negative MRSA PCR 9/30 >> Negative Sputume 10/1 > few candida tropicalis RVP 10/1 >> negative BCx2 9/29 >> negative  Tracheal aspirate 10/1 >> few candida tropicalis   Antimicrobials:  Azithromycin 9/29 >> 10/4 Cefepime 9/29 >> 10/6 Vancomycin 9/29 >> 10/1  Interim history/subjective:  Afebrile / WBC increased to 16.3 (from 8 > 9.2 > 12.5) On vent - PEEP 8, 50% FiO2 Glucose range 141-224 I/O 3.3L UOP, 1.8L+ in last 24 hours  Objective   Blood pressure 111/74, pulse 87, temperature 97.6 F (36.4 C), temperature source Axillary, resp. rate (!) 30, height 5\' 9"  (1.753 m), weight 82.9 kg, SpO2 100 %.    Vent Mode:  PRVC FiO2 (%):  [50 %] 50 % Set Rate:  [30 bmp] 30 bmp Vt Set:  [490 mL] 490 mL PEEP:  [8 cmH20-10 cmH20] 8 cmH20 Plateau Pressure:  [19 cmH20-25 cmH20] 23 cmH20   Intake/Output Summary  (Last 24 hours) at 08/04/2020 0758 Last data filed at 08/04/2020 0700 Gross per 24 hour  Intake 4965.61 ml  Output 3260 ml  Net 1705.61 ml   Filed Weights   08/02/20 0345 08/03/20 0331 08/04/20 0235  Weight: 86.8 kg 84.2 kg 82.9 kg   Examination:  General: adult male lying in bed in NAD on vent, critically ill appearing  HEENT: MM pink/moist, ETT, anicteric  Neuro: sedate CV: s1s2 rrr, no m/r/g PULM: non-labored on vent, lungs bilaterally clear GI: soft, bsx4 active  Extremities: warm/dry, no edema  Skin: no rashes or lesions  CXR 10/6 >> images personally reviewed, improved LLL airspace disease, diffuse bilateral interstitial opacities / fibrosis   Resolved Hospital Problem list     Assessment & Plan:   Acute Hypoxemic and Hypercapnic Respiratory Failure Pulmonary Fibrosis, Pulmonary Hypertension In setting of underlying pulmonary emphysema with fibrosis and pulmonary hypertension presenting with pneumonia. Concern for shunt physiology regarding the pneumonia and underlying lung disease.  Required  -PRVC 7cc/kg, rate 30 lung protective ventilation measures -wean PEEP / FiO2 for sats >90% -abx to stop 10/6 -note candida tropicalis in tracheal aspirate, clinically improving overall but note rise in WBC. Likely colonization but he has been on high dose steroids for RBILD.  If new fever, consider antifungal coverage (would need eraxis) -continue brovana, budesonide, yupelri -solu-medrol 40 mg BID > hold taper until further progression of O2 weaning  -RASS goal -3 to -4 with PAD protocol + PRN paralytics -hold lasix 10/6, euvolemic on exam.  Reassess daily for need.  Septic Shock In setting of pneumonia, respiratory failure and right heart failure -wean levophed for MAP >65 -intermittent lasix as above   Diabetes Mellitus -SSI, resistant scale  -TF coverage, 4 units Q4 -increase lantus to 17 units QD  Hypernatremia -free water 300 ml Q3   Depression -hold home  cymbalta  Best practice:  Diet: TF Pain/Anxiety/Delirium protocol (if indicated): PAD Protocol   VAP protocol (if indicated): in place  DVT prophylaxis: Lovenox GI prophylaxis: PPI   Glucose control: SSI Mobility: Bed Rest Code Status: Full Code  Family Communication: Wife is quite ill so please call daughter for updates.  Daughter Carollee Herter(Shannon) called for update Disposition: ICU  Labs    CBC: Recent Labs  Lab 2020-05-30 0840 07/29/20 0801 07/31/20 0340 07/31/20 0535 08/01/20 0321 08/01/20 [id number] 08/02/20 0340 08/02/20 0340 08/02/20 0503 08/03/20 0333 08/03/20 0434 08/04/20 0312 08/04/20 0332  WBC 11.3*   < > 9.3  --  8.0  --  9.2  --   --  12.5*  --  16.3*  --   NEUTROABS 10.1*  --   --   --   --   --   --   --   --   --   --   --   --   HGB 14.9   < > 13.0   < > 11.4*   < > 12.0*   < > 13.6 13.1 14.6 13.4 14.3  HCT 45.6   < > 40.7   < > 37.6*   < > 39.1   < > 40.0 41.2 43.0 43.1 42.0  MCV 90.5   < > 92.3  --  93.8  --  93.1  --   --  93.0  --  92.9  --   PLT 225   < > 229  --  166  --  172  --   --  214  --  220  --    < > = values in this interval not displayed.    Basic Metabolic Panel: Recent Labs  Lab 07/30/20 0703 07/30/20 1151 07/31/20 0340 07/31/20 0535 07/31/20 1518 07/31/20 1518 08/01/20 0321 08/01/20 0865 08/02/20 0340 08/02/20 0340 08/02/20 0503 08/03/20 7846 08/03/20 0434 08/04/20 0312 08/04/20 0332  NA   < >  --  141   < > 148*   < > 149*   < > 146*   < > 147* 149* 147* 143 145  K   < >  --  4.1   < > 4.6   < > 4.8   < > 4.7   < > 5.1 4.4 4.6 4.5 4.3  CL   < >  --  109   < > 112*  --  111  --  105  --   --  105  --  100  --   CO2   < >  --  21*   < > 27  --  29  --  33*  --   --  34*  --  34*  --   GLUCOSE   < >  --  224*   < > 174*  --  189*  --  224*  --   --  157*  --  152*  --   BUN   < >  --  51*   < > 62*  --  59*  --  56*  --   --  52*  --  54*  --   CREATININE   < >  --  1.46*   < > 1.47*  --  1.21  --  1.05  --   --  0.94  --  0.80  --    CALCIUM   < >  --  8.3*   < > 8.4*  --  8.0*  --  8.5*  --   --  9.1  --  9.2  --   MG   < > 2.9* 3.3*  --   --   --  2.7*  --  2.7*  --   --  2.6*  --  2.6*  --   PHOS  --  5.9* 4.3  --  3.7  --   --   --   --   --   --   --   --  4.6  --    < > = values in this interval not displayed.   GFR: Estimated Creatinine Clearance: 99.4 mL/min (by C-G formula based on SCr of 0.8 mg/dL). Recent Labs  Lab 08-03-20 1820 08/03/20 1953 07/29/20 0801 07/30/20 0117 07/30/20 0703 07/31/20 0340 07/31/20 0340 08/01/20 0321 08/01/20 0740 08/02/20 0340 08/03/20 0333 08/04/20 0312  PROCALCITON  --   --   --  1.92  --  1.94  --   --  0.88  --   --   --   WBC  --   --    < >  --    < > 9.3   < > 8.0  --  9.2 12.5* 16.3*  LATICACIDVEN 2.6* 2.6*  --   --   --   --   --   --   --   --   --   --    < > =  values in this interval not displayed.    Liver Function Tests: Recent Labs  Lab 07/10/2020 0840  AST 20  ALT 15  ALKPHOS 53  BILITOT 1.0  PROT 7.4  ALBUMIN 3.3*   No results for input(s): LIPASE, AMYLASE in the last 168 hours. No results for input(s): AMMONIA in the last 168 hours.  ABG    Component Value Date/Time   PHART 7.404 08/04/2020 0332   PCO2ART 69.4 (HH) 08/04/2020 0332   PO2ART 80 (L) 08/04/2020 0332   HCO3 43.6 (H) 08/04/2020 0332   TCO2 46 (H) 08/04/2020 0332   ACIDBASEDEF 4.0 (H) 07/30/2020 1005   O2SAT 95.0 08/04/2020 0332     Coagulation Profile: No results for input(s): INR, PROTIME in the last 168 hours.  Cardiac Enzymes: No results for input(s): CKTOTAL, CKMB, CKMBINDEX, TROPONINI in the last 168 hours.  HbA1C: Hgb A1c MFr Bld  Date/Time Value Ref Range Status  07/30/2020 11:51 AM 6.9 (H) 4.8 - 5.6 % Final    Comment:    (NOTE) Pre diabetes:          5.7%-6.4%  Diabetes:              >6.4%  Glycemic control for   <7.0% adults with diabetes   09/16/2016 04:40 AM 6.9 (H) 4.8 - 5.6 % Final    Comment:    (NOTE)         Pre-diabetes: 5.7 - 6.4          Diabetes: >6.4         Glycemic control for adults with diabetes: <7.0     CBG: Recent Labs  Lab 08/03/20 1528 08/03/20 1931 08/03/20 2333 08/04/20 0312 08/04/20 0730  GLUCAP 118* 196* 175* 147* 148*    Critical care time: 36 minutes    Canary Brim, MSN, NP-C, AGACNP-BC [facility] 08/04/2020, 7:58 AM   Please see [email or web address] for pager details.

## 2020-08-05 ENCOUNTER — Inpatient Hospital Stay (HOSPITAL_COMMUNITY): Payer: Self-pay

## 2020-08-05 DIAGNOSIS — Z8709 Personal history of other diseases of the respiratory system: Secondary | ICD-10-CM

## 2020-08-05 LAB — CBC
HCT: 40.8 % (ref 39.0–52.0)
Hemoglobin: 12.6 g/dL — ABNORMAL LOW (ref 13.0–17.0)
MCH: 28.9 pg (ref 26.0–34.0)
MCHC: 30.9 g/dL (ref 30.0–36.0)
MCV: 93.6 fL (ref 80.0–100.0)
Platelets: 179 10*3/uL (ref 150–400)
RBC: 4.36 MIL/uL (ref 4.22–5.81)
RDW: 14.1 % (ref 11.5–15.5)
WBC: 13 10*3/uL — ABNORMAL HIGH (ref 4.0–10.5)
nRBC: 0 % (ref 0.0–0.2)

## 2020-08-05 LAB — GLUCOSE, CAPILLARY
Glucose-Capillary: 103 mg/dL — ABNORMAL HIGH (ref 70–99)
Glucose-Capillary: 128 mg/dL — ABNORMAL HIGH (ref 70–99)
Glucose-Capillary: 152 mg/dL — ABNORMAL HIGH (ref 70–99)
Glucose-Capillary: 158 mg/dL — ABNORMAL HIGH (ref 70–99)
Glucose-Capillary: 162 mg/dL — ABNORMAL HIGH (ref 70–99)
Glucose-Capillary: 184 mg/dL — ABNORMAL HIGH (ref 70–99)

## 2020-08-05 LAB — BASIC METABOLIC PANEL
Anion gap: 8 (ref 5–15)
BUN: 48 mg/dL — ABNORMAL HIGH (ref 6–20)
CO2: 36 mmol/L — ABNORMAL HIGH (ref 22–32)
Calcium: 9.2 mg/dL (ref 8.9–10.3)
Chloride: 101 mmol/L (ref 98–111)
Creatinine, Ser: 0.77 mg/dL (ref 0.61–1.24)
GFR calc non Af Amer: >60 mL/min (ref >60)
Glucose, Bld: 164 mg/dL — ABNORMAL HIGH (ref 70–99)
Potassium: 4.3 mmol/L (ref 3.5–5.1)
Sodium: 145 mmol/L (ref 135–145)

## 2020-08-05 LAB — TRIGLYCERIDES
Triglycerides: 399 mg/dL — ABNORMAL HIGH (ref <150)
Triglycerides: 181 mg/dL — ABNORMAL HIGH (ref <150)

## 2020-08-05 MED ORDER — FUROSEMIDE 10 MG/ML IJ SOLN
40.0000 mg | Freq: Once | INTRAMUSCULAR | Status: AC
  Administered 2020-08-05: 40 mg via INTRAVENOUS
  Filled 2020-08-05: qty 4

## 2020-08-05 MED ORDER — INSULIN ASPART 100 UNIT/ML ~~LOC~~ SOLN
5.0000 [IU] | SUBCUTANEOUS | Status: DC
  Administered 2020-08-05 – 2020-08-08 (×18): 5 [IU] via SUBCUTANEOUS

## 2020-08-05 MED ORDER — METHYLPREDNISOLONE SODIUM SUCC 40 MG IJ SOLR
40.0000 mg | Freq: Every day | INTRAMUSCULAR | Status: DC
  Administered 2020-08-06: 40 mg via INTRAVENOUS
  Filled 2020-08-05: qty 1

## 2020-08-05 NOTE — Plan of Care (Signed)

## 2020-08-05 NOTE — Progress Notes (Signed)
Patient in & out cath'ed X2.  450cc's of clear, yellow, urine obtained.

## 2020-08-05 NOTE — Progress Notes (Signed)
eLink Physician-Brief Progress Note Patient Name: Bradley [name] DOB: [birth year] MRN: [id number]   Date of Service  08/05/2020  HPI/Events of Note  CXR without significant change from baseline.  eICU Interventions  No intervention.        Thomasene Lot Jye Fariss 08/05/2020, 10:14 PM

## 2020-08-05 NOTE — Progress Notes (Signed)
eLink Physician-Brief Progress Note Patient Name: Bradley [name] DOB: 1961-05-28 MRN: [id number]   Date of Service  08/05/2020  HPI/Events of Note  Patient with significant increase in  FIO2  In past 24 hours.  eICU Interventions  CXR to evaluate lungs.        Bradley [name] 08/05/2020, 9:22 PM

## 2020-08-05 NOTE — Progress Notes (Addendum)
NAME:  Bradley [name], MRN:  [id number], DOB:  [birth year], LOS: 8 ADMISSION DATE:  07/25/2020, CONSULTATION DATE:  07/29/20 REFERRING MD:  Jeni Salles CHIEF COMPLAINT:  Resp Distress   Brief History   59 year old, ex-smoker, admitted 9/29 with severe acute hypoxic respiratory failure and bilateral infiltrates/community-acquired pneumonia that failed outpatient therapy. Covid and flu negative.  PFT in 2017 PFTs showing isolated decrease in DLCO-felt to have a combination of emphysema noted on CT and RB ILD/fibrosis.  He was hospitalized 07/2016 for ARDS, no infectious etiology identified, autoimmune serologies were negative as except for isolated increase in RA factor, CCP negative. He required a tracheostomy but recovered well from this illness and did not need long-term oxygen.  He has been lost to follow-up from the pulmonary clinic since then.  CXR in February 2018 which shows clearing of bilateral infiltrates.  Past Medical History  Emphysema/Pulmonary Fibrosis Anxiety Kidney Stone Pneumonia Diabetes Mellitus Type II  Significant Hospital Events   9/29 Admit  9/30 Intubated   10/02 Inhaled NO initiated 10/05 Weaned off NO overnight. Needing sedation + PRN paralytics for frequent dyssynchrony and desaturation  10/06 FiO2 weaned to 40%, PEEP 5. Failed SBT with tachypnea, desaturation. Off levophed.   Consults:  PCCM  Procedures:  ETT 9/30 >>   Significant Diagnostic Tests:  PFT 11/ 2016 >> isolated reduction in dlco to 59% Spirometry 12/06/2016 >> FEV1 2.37 (65%) Ratio 62 ,FVC 3.84 (81%) CT chest 07/14/15 >> c/w RBILD or DIP, not UIP CT angio chest 07/2016 >>Diffuse bilateral regional ground-glass airspace opacification, with underlying emphysema and interstitial prominence. This is markedly worsened from 2016, reflecting acute airspace disease superimposed on the patient's fibrotic change. Enlarged mediastinal nodes, measuring up to 1.9 cm   LE Venous duplex 9/29 >>  negative Echo 9/30 >> LV EF 55%. Interventricular septal flattening in systole and diastole consistent with RV pressure and volume overload. RV-RA gradient is severely elevated at consistent with pulmonary hypertension. RV systolic function is moderately to severely reduced. RV size mildly enlarged. No valvular issues. CTA Chest 10/1 >> Negative for PE. Diffuse parenchymal ground-glass opacities bilaterally. Emphysema. Enlarged pulmonary trunk concerning for PH.   Micro Data:  Covid 9/29 >> Negative Influenza 9/29 >> Negative MRSA PCR 9/30 >> Negative Sputume 10/1 > few candida tropicalis RVP 10/1 >> negative BCx2 9/29 >> negative  Tracheal aspirate 10/1 >> few candida tropicalis   Antimicrobials:  Azithromycin 9/29 >> 10/4 Cefepime 9/29 >> 10/6 Vancomycin 9/29 >> 10/1  Interim history/subjective:  Afebrile / WBC 13 Glucose range 128-203 I/O 1.7L UOP, +2.9L in last 24 hours Elevated triglycerides, ? Error / drawn from PICC line Pt required I/O cath x2 overnight  RT reports pt failed SBT with tachypnea and desaturation Off levophed   Objective   Blood pressure 99/69, pulse 83, temperature 98.7 F (37.1 C), temperature source Oral, resp. rate (!) 28, height 5\' 9"  (1.753 m), weight 83.8 kg, SpO2 98 %.    Vent Mode: PRVC FiO2 (%):  [40 %] 40 % Set Rate:  [30 bmp] 30 bmp Vt Set:  [490 mL] 490 mL PEEP:  [8 cmH20] 8 cmH20 Plateau Pressure:  [21 cmH20-23 cmH20] 22 cmH20   Intake/Output Summary (Last 24 hours) at 08/05/2020 0812 Last data filed at 08/05/2020 0700 Gross per 24 hour  Intake 4627.32 ml  Output 1765 ml  Net 2862.32 ml   Filed Weights   08/03/20 0331 08/04/20 0235 08/05/20 0400  Weight: 84.2 kg 82.9 kg 83.8 kg  Examination:  General: adult male lying in bed in NAD on vent HEENT: MM pink/moist, anicteric  Neuro: sedate CV: s1s2 RRR, no m/r/g PULM: non-labored on vent, good air entry, clear anterior  GI: soft, bsx4 active  Extremities: warm/dry, trace  dependent edema  Skin: no rashes or lesions  Resolved Hospital Problem list     Assessment & Plan:   Acute Hypoxemic and Hypercapnic Respiratory Failure Pulmonary Fibrosis, Pulmonary Hypertension In setting of underlying pulmonary emphysema with fibrosis and pulmonary hypertension presenting with pneumonia. Concern for shunt physiology regarding the pneumonia and underlying lung disease.     -PRVC 7cc/kg, rate 30.  Lung protective ventilation measures -wean PEEP / FiO2 for sats >90% -monitor off abx  -assess ABG -wean solumedrol to 40 mg IV QD -RASS Goal -2 to -3 with ventilator synchrony  -lasix 40 mg IV x1 -continue brovna, budesonide, yupelri  -repeat peripheral stick for triglycerides.  Suspect this is falsely elevated with draw from PICC line   Septic Shock In setting of pneumonia, respiratory failure and right heart failure -monitor off levophed -lasix as above   Diabetes Mellitus -SSI, resistant scale  -increase TF coverage to 5 units Q4 -lantus 17 units QD   Hypernatremia -free water Q3   Depression -hold home cymbalta   Best practice:  Diet: TF Pain/Anxiety/Delirium protocol (if indicated): PAD Protocol   VAP protocol (if indicated): in place  DVT prophylaxis: Lovenox GI prophylaxis: PPI   Glucose control: SSI Mobility: Bed Rest Code Status: Full Code  Family Communication: Wife is quite ill so please call daughter for updates.  Daughter Carollee Herter) called for update 10/7, no answer. Message left for return call.  Disposition: ICU  Labs    CBC: Recent Labs  Lab 08/01/20 0321 08/01/20 0835 08/02/20 0340 08/02/20 0503 08/03/20 9379 08/03/20 0434 08/04/20 0312 08/04/20 0332 08/05/20 0400  WBC 8.0  --  9.2  --  12.5*  --  16.3*  --  13.0*  HGB 11.4*   < > 12.0*   < > 13.1 14.6 13.4 14.3 12.6*  HCT 37.6*   < > 39.1   < > 41.2 43.0 43.1 42.0 40.8  MCV 93.8  --  93.1  --  93.0  --  92.9  --  93.6  PLT 166  --  172  --  214  --  220  --  179    < > = values in this interval not displayed.    Basic Metabolic Panel: Recent Labs  Lab 07/30/20 0703 07/30/20 1151 07/31/20 0340 07/31/20 0535 07/31/20 1518 07/31/20 1518 08/01/20 0321 08/01/20 0240 08/02/20 0340 08/02/20 0503 08/03/20 9735 08/03/20 0434 08/04/20 0312 08/04/20 0332 08/05/20 0400  NA   < >  --  141   < > 148*   < > 149*   < > 146*   < > 149* 147* 143 145 145  K   < >  --  4.1   < > 4.6   < > 4.8   < > 4.7   < > 4.4 4.6 4.5 4.3 4.3  CL   < >  --  109   < > 112*   < > 111  --  105  --  105  --  100  --  101  CO2   < >  --  21*   < > 27   < > 29  --  33*  --  34*  --  34*  --  36*  GLUCOSE   < >  --  224*   < > 174*   < > 189*  --  224*  --  157*  --  152*  --  164*  BUN   < >  --  51*   < > 62*   < > 59*  --  56*  --  52*  --  54*  --  48*  CREATININE   < >  --  1.46*   < > 1.47*   < > 1.21  --  1.05  --  0.94  --  0.80  --  0.77  CALCIUM   < >  --  8.3*   < > 8.4*   < > 8.0*  --  8.5*  --  9.1  --  9.2  --  9.2  MG   < > 2.9* 3.3*  --   --   --  2.7*  --  2.7*  --  2.6*  --  2.6*  --   --   PHOS  --  5.9* 4.3  --  3.7  --   --   --   --   --   --   --  4.6  --   --    < > = values in this interval not displayed.   GFR: Estimated Creatinine Clearance: 99.4 mL/min (by C-G formula based on SCr of 0.77 mg/dL). Recent Labs  Lab 07/30/20 0117 07/30/20 0703 07/31/20 0340 08/01/20 0321 08/01/20 0740 08/02/20 0340 08/03/20 0333 08/04/20 0312 08/05/20 0400  PROCALCITON 1.92  --  1.94  --  0.88  --   --   --   --   WBC  --    < > 9.3   < >  --  9.2 12.5* 16.3* 13.0*   < > = values in this interval not displayed.    Liver Function Tests: No results for input(s): AST, ALT, ALKPHOS, BILITOT, PROT, ALBUMIN in the last 168 hours. No results for input(s): LIPASE, AMYLASE in the last 168 hours. No results for input(s): AMMONIA in the last 168 hours.  ABG    Component Value Date/Time   PHART 7.404 08/04/2020 0332   PCO2ART 69.4 (HH) 08/04/2020 0332   PO2ART  80 (L) 08/04/2020 0332   HCO3 43.6 (H) 08/04/2020 0332   TCO2 46 (H) 08/04/2020 0332   ACIDBASEDEF 4.0 (H) 07/30/2020 1005   O2SAT 95.0 08/04/2020 0332     Coagulation Profile: No results for input(s): INR, PROTIME in the last 168 hours.  Cardiac Enzymes: No results for input(s): CKTOTAL, CKMB, CKMBINDEX, TROPONINI in the last 168 hours.  HbA1C: Hgb A1c MFr Bld  Date/Time Value Ref Range Status  07/30/2020 11:51 AM 6.9 (H) 4.8 - 5.6 % Final    Comment:    (NOTE) Pre diabetes:          5.7%-6.4%  Diabetes:              >6.4%  Glycemic control for   <7.0% adults with diabetes   09/16/2016 04:40 AM 6.9 (H) 4.8 - 5.6 % Final    Comment:    (NOTE)         Pre-diabetes: 5.7 - 6.4         Diabetes: >6.4         Glycemic control for adults with diabetes: <7.0     CBG: Recent Labs  Lab 08/04/20 1524 08/04/20 1911 08/04/20 2309 08/05/20 0301 08/05/20  0723  GLUCAP 147* 146* 169* 158* 152*    Critical care time: 33 minutes    Canary Brim, MSN, NP-C, AGACNP-BC [facility] 08/05/2020, 8:12 AM   Please see [email or web address] for pager details.

## 2020-08-05 NOTE — Progress Notes (Signed)
Nutrition Follow Up  DOCUMENTATION CODES:   Not applicable  INTERVENTION:   Tube feeding:  -Vital High Protein @ 50 ml/hr via OG (1200 ml) -45 ml ProSource TID -Free water flushes 300 ml Q3  Provides: 1320 kcals (2025 kcal with propofol), 138 grams protein, 1003 ml free water (3403 ml with flushes)  NUTRITION DIAGNOSIS:   Increased nutrient needs related to acute illness as evidenced by estimated needs.  Ongoing  GOAL:   Patient will meet greater than or equal to 90% of their needs   Addressed via TF  MONITOR:   PO intake, Supplement acceptance, Weight trends, TF tolerance, Vent status, Diet advancement, I & O's, Labs  REASON FOR ASSESSMENT:   Consult, Ventilator Enteral/tube feeding initiation and management  ASSESSMENT:   Patient with PMH significant for COPD, DM, and ILD/pulmonary fibrosis. Presents this admission with CAP and bilateral infiltrates.   Failed SBT yesterday. Off pressors. Continues on propofol. TG slightly elevated this am. Hypernatremia improved with free water flushes. Tolerating tube feeding at goal rate.   Admission weight: 87.6 kg  Current weight: 83.8 kg   Patient remains intubated on ventilator support MV: 15.3 L/min Temp (24hrs), Avg:98.3 F (36.8 C), Min:96.8 F (36 C), Max:98.9 F (37.2 C)  Propofol: 26.7 ml/hr- provides 705 kcal from lipids daily  UOP: 1765 ml x 24 hrs   Drips: propofol  Medications: colace, SS novolog, lantus, solumedrol, miralax Labs: CBG  128-203 TGs 181  Diet Order:   Diet Order            Diet NPO time specified  Diet effective now                 EDUCATION NEEDS:   Education needs have been addressed  Skin:  Skin Assessment: Reviewed RN Assessment  Last BM:  10/7  Height:   Ht Readings from Last 1 Encounters:  07/29/20 5\' 9"  (1.753 m)    Weight:   Wt Readings from Last 1 Encounters:  08/05/20 83.8 kg    BMI:  Body mass index is 27.28 kg/m.  Estimated Nutritional Needs:    Kcal:  1990 kcal  Protein:  125-150 grams  Fluid:  >/= 1.9 L/day   10/05/20 RD, LDN Clinical Nutrition Pager listed in AMION

## 2020-08-06 ENCOUNTER — Inpatient Hospital Stay (HOSPITAL_COMMUNITY): Payer: Self-pay

## 2020-08-06 LAB — POCT I-STAT 7, (LYTES, BLD GAS, ICA,H+H)
Acid-Base Excess: 13 mmol/L — ABNORMAL HIGH (ref 0.0–2.0)
Bicarbonate: 40.8 mmol/L — ABNORMAL HIGH (ref 20.0–28.0)
Calcium, Ion: 1.14 mmol/L — ABNORMAL LOW (ref 1.15–1.40)
HCT: 42 % (ref 39.0–52.0)
Hemoglobin: 14.3 g/dL (ref 13.0–17.0)
O2 Saturation: 89 %
Potassium: 4.4 mmol/L (ref 3.5–5.1)
Sodium: 136 mmol/L (ref 135–145)
TCO2: 43 mmol/L — ABNORMAL HIGH (ref 22–32)
pCO2 arterial: 64.1 mmHg — ABNORMAL HIGH (ref 32.0–48.0)
pH, Arterial: 7.412 (ref 7.350–7.450)
pO2, Arterial: 59 mmHg — ABNORMAL LOW (ref 83.0–108.0)

## 2020-08-06 LAB — GLUCOSE, CAPILLARY
Glucose-Capillary: 107 mg/dL — ABNORMAL HIGH (ref 70–99)
Glucose-Capillary: 126 mg/dL — ABNORMAL HIGH (ref 70–99)
Glucose-Capillary: 148 mg/dL — ABNORMAL HIGH (ref 70–99)
Glucose-Capillary: 170 mg/dL — ABNORMAL HIGH (ref 70–99)
Glucose-Capillary: 178 mg/dL — ABNORMAL HIGH (ref 70–99)
Glucose-Capillary: 220 mg/dL — ABNORMAL HIGH (ref 70–99)

## 2020-08-06 LAB — BASIC METABOLIC PANEL
Anion gap: 9 (ref 5–15)
BUN: 43 mg/dL — ABNORMAL HIGH (ref 6–20)
CO2: 35 mmol/L — ABNORMAL HIGH (ref 22–32)
Calcium: 8.8 mg/dL — ABNORMAL LOW (ref 8.9–10.3)
Chloride: 97 mmol/L — ABNORMAL LOW (ref 98–111)
Creatinine, Ser: 0.71 mg/dL (ref 0.61–1.24)
GFR calc non Af Amer: >60 mL/min (ref >60)
Glucose, Bld: 117 mg/dL — ABNORMAL HIGH (ref 70–99)
Potassium: 3.8 mmol/L (ref 3.5–5.1)
Sodium: 141 mmol/L (ref 135–145)

## 2020-08-06 LAB — CBC
HCT: 41.8 % (ref 39.0–52.0)
Hemoglobin: 13 g/dL (ref 13.0–17.0)
MCH: 29.1 pg (ref 26.0–34.0)
MCHC: 31.1 g/dL (ref 30.0–36.0)
MCV: 93.5 fL (ref 80.0–100.0)
Platelets: 153 10*3/uL (ref 150–400)
RBC: 4.47 MIL/uL (ref 4.22–5.81)
RDW: 14.5 % (ref 11.5–15.5)
WBC: 14.9 10*3/uL — ABNORMAL HIGH (ref 4.0–10.5)
nRBC: 0 % (ref 0.0–0.2)

## 2020-08-06 MED ORDER — NOREPINEPHRINE 4 MG/250ML-% IV SOLN
INTRAVENOUS | Status: AC
  Filled 2020-08-06: qty 250

## 2020-08-06 MED ORDER — POTASSIUM CHLORIDE 20 MEQ/15ML (10%) PO SOLN
40.0000 meq | Freq: Once | ORAL | Status: AC
  Administered 2020-08-06: 40 meq
  Filled 2020-08-06: qty 30

## 2020-08-06 MED ORDER — FUROSEMIDE 10 MG/ML IJ SOLN
40.0000 mg | Freq: Once | INTRAMUSCULAR | Status: AC
  Administered 2020-08-07: 40 mg via INTRAVENOUS
  Filled 2020-08-06: qty 4

## 2020-08-06 MED ORDER — FUROSEMIDE 10 MG/ML IJ SOLN
40.0000 mg | Freq: Once | INTRAMUSCULAR | Status: AC
  Administered 2020-08-06: 40 mg via INTRAVENOUS
  Filled 2020-08-06: qty 4

## 2020-08-06 MED ORDER — METHYLPREDNISOLONE SODIUM SUCC 125 MG IJ SOLR
60.0000 mg | Freq: Every day | INTRAMUSCULAR | Status: DC
  Administered 2020-08-07 – 2020-08-08 (×2): 60 mg via INTRAVENOUS
  Filled 2020-08-06 (×2): qty 2

## 2020-08-06 MED ORDER — METOLAZONE 2.5 MG PO TABS
5.0000 mg | ORAL_TABLET | Freq: Once | ORAL | Status: AC
  Administered 2020-08-06: 5 mg
  Filled 2020-08-06: qty 2

## 2020-08-06 MED ORDER — METOLAZONE 2.5 MG PO TABS
5.0000 mg | ORAL_TABLET | Freq: Once | ORAL | Status: AC
  Administered 2020-08-07: 5 mg via ORAL
  Filled 2020-08-06: qty 2

## 2020-08-06 MED ORDER — MIDAZOLAM HCL 2 MG/2ML IJ SOLN
INTRAMUSCULAR | Status: AC
  Filled 2020-08-06: qty 2

## 2020-08-06 MED ORDER — OXYCODONE HCL 5 MG PO TABS
5.0000 mg | ORAL_TABLET | Freq: Three times a day (TID) | ORAL | Status: DC
  Administered 2020-08-06 – 2020-08-08 (×7): 5 mg
  Filled 2020-08-06 (×7): qty 1

## 2020-08-06 MED ORDER — SODIUM CHLORIDE 0.9 % IV BOLUS
250.0000 mL | Freq: Once | INTRAVENOUS | Status: AC
  Administered 2020-08-06: 250 mL via INTRAVENOUS

## 2020-08-06 MED ORDER — METHYLPREDNISOLONE SODIUM SUCC 40 MG IJ SOLR
20.0000 mg | Freq: Once | INTRAMUSCULAR | Status: AC
  Administered 2020-08-06: 20 mg via INTRAVENOUS
  Filled 2020-08-06: qty 0.5

## 2020-08-06 MED ORDER — MIDAZOLAM HCL 2 MG/2ML IJ SOLN
2.0000 mg | INTRAMUSCULAR | Status: DC | PRN
  Administered 2020-08-06 – 2020-08-08 (×11): 2 mg via INTRAVENOUS
  Filled 2020-08-06 (×11): qty 2

## 2020-08-06 MED ORDER — NOREPINEPHRINE 4 MG/250ML-% IV SOLN
0.0000 ug/min | INTRAVENOUS | Status: AC
  Administered 2020-08-06: 2 ug/min via INTRAVENOUS
  Administered 2020-08-07: 10 ug/min via INTRAVENOUS
  Administered 2020-08-07: 5 ug/min via INTRAVENOUS
  Administered 2020-08-07: 14 ug/min via INTRAVENOUS
  Administered 2020-08-08: 5 ug/min via INTRAVENOUS
  Administered 2020-08-08: 6 ug/min via INTRAVENOUS
  Filled 2020-08-06 (×5): qty 250

## 2020-08-06 MED ORDER — DEXMEDETOMIDINE HCL IN NACL 400 MCG/100ML IV SOLN
0.4000 ug/kg/h | INTRAVENOUS | Status: DC
  Administered 2020-08-06: 0.4 ug/kg/h via INTRAVENOUS
  Administered 2020-08-07 (×3): 0.6 ug/kg/h via INTRAVENOUS
  Administered 2020-08-08 (×2): 0.7 ug/kg/h via INTRAVENOUS
  Administered 2020-08-08 (×2): 0.6 ug/kg/h via INTRAVENOUS
  Administered 2020-08-09 (×2): 0.7 ug/kg/h via INTRAVENOUS
  Filled 2020-08-06 (×3): qty 100
  Filled 2020-08-06: qty 200
  Filled 2020-08-06 (×5): qty 100

## 2020-08-06 NOTE — Progress Notes (Signed)
eLink Physician-Brief Progress Note Patient Name: Bradley [name] DOB: 1961/07/23 MRN: [id number]   Date of Service  08/06/2020  HPI/Events of Note  Hypotension which followed increasing sedation due to ventilator dyssynchrony.  eICU Interventions  Reduce sedation temporarily, NS 250 ml iv bolus, Norepinephrine infusion ordered, arterial line ordered for closer BP monitoring.        [name] 08/06/2020, 8:17 PM

## 2020-08-06 NOTE — TOC Initial Note (Signed)
Transition of Care [facility]) - Initial/Assessment Note    Patient Details  Name: Bradley [name] MRN: [id number] Date of Birth: Oct 04, 1961  [facility] CM/SW Contact:    Lockie Pares, RN Phone Number: 08/06/2020, 12:34 PM  Clinical Narrative:                 Patient admitted with  Hypoxic respiratory failure . Previous hospitalization in 2017 for ARDS. Had a tracheostomy but recovered from that , did not follow up with pulmonology. Has emphysema, pulmonary fibrosis.  Will consult FC to assess him for Medicaid. Has spouse, was on Nitrous, now weaned off, on 50%  Ventilated   CM will continue to follow for needs.   Expected Discharge Plan: Home w Home Health Services Barriers to Discharge: Inadequate or no insurance   Patient Goals and CMS Choice        Expected Discharge Plan and Services Expected Discharge Plan: Home w Home Health Services   Discharge Planning Services: CM Consult   Living arrangements for the past 2 months: Single Family Home                                      Prior Living Arrangements/Services Living arrangements for the past 2 months: Single Family Home Lives with:: Spouse Patient language and need for interpreter reviewed:: Yes        Need for Family Participation in Patient Care: Yes (Comment) Care giver support system in place?: Yes (comment)   Criminal Activity/Legal Involvement Pertinent to Current Situation/Hospitalization: No - Comment as needed  Activities of Daily Living      Permission Sought/Granted                  Emotional Assessment   Attitude/Demeanor/Rapport: Intubated (Following Commands or Not Following Commands) Affect (typically observed): Unable to Assess Orientation: : Fluctuating Orientation (Suspected and/or reported Sundowners) Alcohol / Substance Use: Not Applicable Psych Involvement: No (comment)  Admission diagnosis:  Hypoxia [R09.02] COPD exacerbation (HCC) [J44.1] Encounter for  intubation [Z01.818] Acute respiratory failure with hypoxia (HCC) [J96.01] History of pulmonary fibrosis [Z87.09] PNA (pneumonia) [J18.9] Pulmonary emboli (HCC) [I26.99] Severe sepsis (HCC) [A41.9, R65.20] Endotracheal tube present [Z97.8] Community acquired pneumonia, unspecified laterality [J18.9] Patient Active Problem List   Diagnosis Date Noted  . History of pulmonary fibrosis   . Severe sepsis (HCC) 07/29/2020  . PNA (pneumonia) 2020-07-31  . COPD GOLD II/centrilobular and paraseptal emphysema 12/07/2016  . Dyspnea on exertion 12/06/2016  . Post-operative pain 10/19/2016  . Peripheral edema   . Anxiety state   . Dysphagia   . Hypokalemia   . Generalized anxiety disorder   . Leukocytosis   . Diabetes mellitus type 2 without retinopathy (HCC)   . Debility 09/15/2016  . Anoxic encephalopathy (HCC)   . Tracheostomy status (HCC)   . ARDS (adult respiratory distress syndrome) (HCC)   . Acute hypoxemic respiratory failure (HCC) 08/18/2016  . Community acquired pneumonia 08/18/2016  . COPD exacerbation (HCC) 08/18/2016  . Depression 08/18/2016  . Sepsis --due to pneumonia 08/18/2016  . Complete tear of right rotator cuff 08/14/2016  . Right rotator cuff tear 08/14/2016  . Cigarette smoker 07/23/2015  . Obesity 07/23/2015  . Pulmonary fibrosis/Not UIP pattern 07/22/2015   PCP:  Georgann Housekeeper, MD Pharmacy:   [facility] [phone] - [place], [place] - [address]  [place] [address] Phone: [phone] Fax: [phone]     Social Determinants of Health (SDOH) Interventions    Readmission Risk Interventions No flowsheet data found.

## 2020-08-06 NOTE — Plan of Care (Signed)

## 2020-08-06 NOTE — Progress Notes (Addendum)
NAME:  Bradley [name], MRN:  [id number], DOB:  05-24-1961, LOS: 9 ADMISSION DATE:  07/27/2020, CONSULTATION DATE:  07/29/20 REFERRING MD:  Jeni Salles CHIEF COMPLAINT:  Resp Distress   Brief History   60 year old, ex-smoker, admitted 9/29 with severe acute hypoxic respiratory failure and bilateral infiltrates/community-acquired pneumonia that failed outpatient therapy. Covid and flu negative.  PFT in 2017 PFTs showing isolated decrease in DLCO-felt to have a combination of emphysema noted on CT and RB ILD/fibrosis.  He was hospitalized 07/2016 for ARDS, no infectious etiology identified, autoimmune serologies were negative as except for isolated increase in RA factor, CCP negative. He required a tracheostomy but recovered well from this illness and did not need long-term oxygen.  He has been lost to follow-up from the pulmonary clinic since then.  CXR in February 2018 which shows clearing of bilateral infiltrates.  Past Medical History  Emphysema/Pulmonary Fibrosis Anxiety Kidney Stone Pneumonia Diabetes Mellitus Type II  Significant Hospital Events   9/29 Admit  9/30 Intubated   10/02 Inhaled NO initiated 10/05 Weaned off NO overnight. Needing sedation + PRN paralytics for frequent dyssynchrony and desaturation  10/06 FiO2 weaned to 40%, PEEP 5. Failed SBT with tachypnea, desaturation. Off levophed.  10/07 Ventilator dyssynschrony overnight, CXR unchanged, sedation increased  Consults:  PCCM  Procedures:  ETT 9/30 >>   Significant Diagnostic Tests:  PFT 11/ 2016 >> isolated reduction in dlco to 59% Spirometry 12/06/2016 >> FEV1 2.37 (65%) Ratio 62 ,FVC 3.84 (81%) CT chest 07/14/15 >> c/w RBILD or DIP, not UIP CT angio chest 07/2016 >>Diffuse bilateral regional ground-glass airspace opacification, with underlying emphysema and interstitial prominence. This is markedly worsened from 2016, reflecting acute airspace disease superimposed on the patient's fibrotic change. Enlarged  mediastinal nodes, measuring up to 1.9 cm   LE Venous duplex 9/29 >> negative Echo 9/30 >> LV EF 55%. Interventricular septal flattening in systole and diastole consistent with RV pressure and volume overload. RV-RA gradient is severely elevated at consistent with pulmonary hypertension. RV systolic function is moderately to severely reduced. RV size mildly enlarged. No valvular issues. CTA Chest 10/1 >> Negative for PE. Diffuse parenchymal ground-glass opacities bilaterally. Emphysema. Enlarged pulmonary trunk concerning for PH.   Micro Data:  Covid 9/29 >> Negative Influenza 9/29 >> Negative MRSA PCR 9/30 >> Negative Sputume 10/1 > few candida tropicalis RVP 10/1 >> negative BCx2 9/29 >> negative  Tracheal aspirate 10/1 >> few candida tropicalis   Antimicrobials:  Azithromycin 9/29 >> 10/4 Cefepime 9/29 >> 10/6 Vancomycin 9/29 >> 10/1  Interim history/subjective:  Afebrile  RN reports pt weaned on PSV for 1 hour, then desaturated with increased work of breathing  Sedation increased overnight for ventilator dyssynchrony Vent - 50% FiO2, PEEP 8 Glucose range 117-184 I/O 1.6L UOP, +1.7L in last 24 hours   Objective   Blood pressure 90/68, pulse 94, temperature 98.8 F (37.1 C), resp. rate (!) 24, height 5\' 9"  (1.753 m), weight 85.2 kg, SpO2 100 %.    Vent Mode: PRVC FiO2 (%):  [40 %-60 %] 50 % Set Rate:  [24 bmp] 24 bmp Vt Set:  [490 mL-560 mL] 560 mL PEEP:  [8 cmH20] 8 cmH20 Pressure Support:  [10 cmH20] 10 cmH20 Plateau Pressure:  [17 cmH20-23 cmH20] 23 cmH20   Intake/Output Summary (Last 24 hours) at 08/06/2020 0951 Last data filed at 08/06/2020 0900 Gross per 24 hour  Intake 3111.53 ml  Output 1100 ml  Net 2011.53 ml   Filed Weights   08/04/20  0235 08/05/20 0400 08/06/20 0348  Weight: 82.9 kg 83.8 kg 85.2 kg   Examination:  General: adult male lying in bed in NAD on vent HEENT: MM pink/moist, ETT Neuro: sedate CV: s1s2 rrr, no m/r/g PULM: non-labored  on full support, lungs bilaterally coarse  GI: soft, bsx4 active  Extremities: warm/dry, trace dependent edema  Skin: no rashes or lesions  Resolved Hospital Problem list   Septic Shock secondary to PNA  Assessment & Plan:   Acute Hypoxemic and Hypercapnic Respiratory Failure Pulmonary Fibrosis, Pulmonary Hypertension In setting of underlying pulmonary emphysema with fibrosis and pulmonary hypertension presenting with pneumonia. Concern for shunt physiology regarding the pneumonia and underlying lung disease.     -PRVC 7cc/kg, rate 30. Lung protective ventilation measures -wean PEEP / FiO2 for sats >90% -follow intermittent CXR  -monitor off abx -RASS goal -2 to -3 with ventilator synchrony  -brovana, pulmicort + yupelri -increase solumedrol back to 60 mg IV QD -D8 on vent, may need to discuss trach with family  -repeat lasix 40 mg IV x1 + KCL  Sedation Needs while on Mechanical Ventilation  -fentanyl, propofol per PAD protocol  -add PT oxycodone in an attempt to wean IV sedation   Diabetes Mellitus -SSI, resistant scale  -TF coverage 5 units Q4 -lantus 17 units QD   Hypernatremia -free water 200 ml Q3   Falsely Elevated Hypertriglyceridemia -peripheral lab draw on 10/7 181 > labs prior (625, 399) to that drawn from central access where propofol is infusing   Depression -hold home cymbalta, resume when able to take PO   Best practice:  Diet: TF Pain/Anxiety/Delirium protocol (if indicated): PAD Protocol   VAP protocol (if indicated): in place  DVT prophylaxis: Lovenox GI prophylaxis: PPI   Glucose control: SSI Mobility: Bed Rest Code Status: Full Code  Family Communication: Wife is quite ill so please call daughter for updates.  Daughter Carollee Herter) called for update 10/8, message left for return call. Unable to reach her on 10/7.  Last update 10/6 via phone.  Disposition: ICU  Labs    CBC: Recent Labs  Lab 08/02/20 0340 08/02/20 0503 08/03/20 0333  08/03/20 0333 08/03/20 0434 08/04/20 0312 08/04/20 0332 08/05/20 0400 08/06/20 0341  WBC 9.2  --  12.5*  --   --  16.3*  --  13.0* 14.9*  HGB 12.0*   < > 13.1   < > 14.6 13.4 14.3 12.6* 13.0  HCT 39.1   < > 41.2   < > 43.0 43.1 42.0 40.8 41.8  MCV 93.1  --  93.0  --   --  92.9  --  93.6 93.5  PLT 172  --  214  --   --  220  --  179 153   < > = values in this interval not displayed.    Basic Metabolic Panel: Recent Labs  Lab 07/30/20 1151 07/30/20 1151 07/31/20 0340 07/31/20 0535 07/31/20 1518 07/31/20 1518 08/01/20 0321 08/01/20 6734 08/02/20 0340 08/02/20 0503 08/03/20 1937 08/03/20 9024 08/03/20 0434 08/04/20 0312 08/04/20 0332 08/05/20 0400 08/06/20 0341  NA  --    < > 141   < > 148*   < > 149*   < > 146*   < > 149*   < > 147* 143 145 145 141  K  --    < > 4.1   < > 4.6   < > 4.8   < > 4.7   < > 4.4   < > 4.6 4.5  4.3 4.3 3.8  CL  --   --  109   < > 112*   < > 111  --  105  --  105  --   --  100  --  101 97*  CO2  --   --  21*   < > 27   < > 29  --  33*  --  34*  --   --  34*  --  36* 35*  GLUCOSE  --   --  224*   < > 174*   < > 189*  --  224*  --  157*  --   --  152*  --  164* 117*  BUN  --   --  51*   < > 62*   < > 59*  --  56*  --  52*  --   --  54*  --  48* 43*  CREATININE  --   --  1.46*   < > 1.47*   < > 1.21  --  1.05  --  0.94  --   --  0.80  --  0.77 0.71  CALCIUM  --   --  8.3*   < > 8.4*   < > 8.0*  --  8.5*  --  9.1  --   --  9.2  --  9.2 8.8*  MG 2.9*   < > 3.3*  --   --   --  2.7*  --  2.7*  --  2.6*  --   --  2.6*  --   --   --   PHOS 5.9*  --  4.3  --  3.7  --   --   --   --   --   --   --   --  4.6  --   --   --    < > = values in this interval not displayed.   GFR: Estimated Creatinine Clearance: 107.6 mL/min (by C-G formula based on SCr of 0.71 mg/dL). Recent Labs  Lab 07/31/20 0340 08/01/20 0321 08/01/20 0740 08/02/20 0340 08/03/20 0333 08/04/20 0312 08/05/20 0400 08/06/20 0341  PROCALCITON 1.94  --  0.88  --   --   --   --   --   WBC  9.3   < >  --    < > 12.5* 16.3* 13.0* 14.9*   < > = values in this interval not displayed.    Liver Function Tests: No results for input(s): AST, ALT, ALKPHOS, BILITOT, PROT, ALBUMIN in the last 168 hours. No results for input(s): LIPASE, AMYLASE in the last 168 hours. No results for input(s): AMMONIA in the last 168 hours.  ABG    Component Value Date/Time   PHART 7.404 08/04/2020 0332   PCO2ART 69.4 (HH) 08/04/2020 0332   PO2ART 80 (L) 08/04/2020 0332   HCO3 43.6 (H) 08/04/2020 0332   TCO2 46 (H) 08/04/2020 0332   ACIDBASEDEF 4.0 (H) 07/30/2020 1005   O2SAT 95.0 08/04/2020 0332     Coagulation Profile: No results for input(s): INR, PROTIME in the last 168 hours.  Cardiac Enzymes: No results for input(s): CKTOTAL, CKMB, CKMBINDEX, TROPONINI in the last 168 hours.  HbA1C: Hgb A1c MFr Bld  Date/Time Value Ref Range Status  07/30/2020 11:51 AM 6.9 (H) 4.8 - 5.6 % Final    Comment:    (NOTE) Pre diabetes:          5.7%-6.4%  Diabetes:              >  6.4%  Glycemic control for   <7.0% adults with diabetes   09/16/2016 04:40 AM 6.9 (H) 4.8 - 5.6 % Final    Comment:    (NOTE)         Pre-diabetes: 5.7 - 6.4         Diabetes: >6.4         Glycemic control for adults with diabetes: <7.0     CBG: Recent Labs  Lab 08/05/20 1552 08/05/20 1901 08/05/20 2331 08/06/20 0310 08/06/20 0714  GLUCAP 162* 128* 103* 126* 148*    Critical care time: 32 minutes    Canary BrimBrandi Tobechukwu Emmick, MSN, NP-C, AGACNP-BC [facility] 08/06/2020, 9:51 AM   Please see [email or web address] for pager details.

## 2020-08-06 NOTE — Procedures (Signed)
Arterial Catheter Insertion Procedure Note  JAEL KOSTICK  [id number]  05-17-1961  Date:08/06/20  Time:10:59 PM    Provider Performing: Mikey Kirschner Jefferie Holston    Procedure: Insertion of Arterial Line (11572) without US guidance  Indication(s) Blood pressure monitoring and/or need for frequent ABGs  Consent Unable to obtain consent due to emergent nature of procedure.  Anesthesia None   Time Out Verified patient identification, verified procedure, site/side was marked, verified correct patient position, special equipment/implants available, medications/allergies/relevant history reviewed, required imaging and test results available.   Sterile Technique Maximal sterile technique including full sterile barrier drape, hand hygiene, sterile gown, sterile gloves, mask, hair covering, sterile ultrasound probe cover (if used).   Procedure Description Area of catheter insertion was cleaned with chlorhexidine and draped in sterile fashion. Without real-time ultrasound guidance an arterial catheter was placed into the left radial artery.  Appropriate arterial tracings confirmed on monitor.     Complications/Tolerance None; patient tolerated the procedure well.   EBL Minimal   Specimen(s) None

## 2020-08-06 NOTE — Progress Notes (Signed)
eLink Physician-Brief Progress Note Patient Name: OREN BARELLA DOB: [birth year] MRN: [id number]   Date of Service  08/06/2020  HPI/Events of Note  Patient with ventilator dyssynchrony.  eICU Interventions  Fentanyl ceiling increased to 300 mcg and Versed 2 mg iv Q 2 hours PRN ordered.        Thomasene Lot Azim Gillingham 08/06/2020, 12:03 AM

## 2020-08-07 ENCOUNTER — Inpatient Hospital Stay (HOSPITAL_COMMUNITY): Payer: Self-pay

## 2020-08-07 LAB — BASIC METABOLIC PANEL
Anion gap: 10 (ref 5–15)
Anion gap: 8 (ref 5–15)
BUN: 57 mg/dL — ABNORMAL HIGH (ref 6–20)
BUN: 54 mg/dL — ABNORMAL HIGH (ref 6–20)
CO2: 35 mmol/L — ABNORMAL HIGH (ref 22–32)
CO2: 36 mmol/L — ABNORMAL HIGH (ref 22–32)
Calcium: 9 mg/dL (ref 8.9–10.3)
Calcium: 8 mg/dL — ABNORMAL LOW (ref 8.9–10.3)
Chloride: 95 mmol/L — ABNORMAL LOW (ref 98–111)
Chloride: 94 mmol/L — ABNORMAL LOW (ref 98–111)
Creatinine, Ser: 0.9 mg/dL (ref 0.61–1.24)
Creatinine, Ser: 0.86 mg/dL (ref 0.61–1.24)
GFR, Estimated: >60 mL/min (ref >60)
GFR, Estimated: >60 mL/min (ref >60)
Glucose, Bld: 154 mg/dL — ABNORMAL HIGH (ref 70–99)
Glucose, Bld: 225 mg/dL — ABNORMAL HIGH (ref 70–99)
Potassium: 4.5 mmol/L (ref 3.5–5.1)
Potassium: 4.3 mmol/L (ref 3.5–5.1)
Sodium: 140 mmol/L (ref 135–145)
Sodium: 138 mmol/L (ref 135–145)

## 2020-08-07 LAB — LACTATE DEHYDROGENASE: LDH: 292 U/L — ABNORMAL HIGH (ref 98–192)

## 2020-08-07 LAB — CBC
HCT: 41.5 % (ref 39.0–52.0)
Hemoglobin: 12.9 g/dL — ABNORMAL LOW (ref 13.0–17.0)
MCH: 28.9 pg (ref 26.0–34.0)
MCHC: 31.1 g/dL (ref 30.0–36.0)
MCV: 92.8 fL (ref 80.0–100.0)
Platelets: 175 10*3/uL (ref 150–400)
RBC: 4.47 MIL/uL (ref 4.22–5.81)
RDW: 13.9 % (ref 11.5–15.5)
WBC: 18.3 10*3/uL — ABNORMAL HIGH (ref 4.0–10.5)
nRBC: 0 % (ref 0.0–0.2)

## 2020-08-07 LAB — PROCALCITONIN: Procalcitonin: 3.45 ng/mL

## 2020-08-07 LAB — GLUCOSE, CAPILLARY
Glucose-Capillary: 136 mg/dL — ABNORMAL HIGH (ref 70–99)
Glucose-Capillary: 136 mg/dL — ABNORMAL HIGH (ref 70–99)
Glucose-Capillary: 150 mg/dL — ABNORMAL HIGH (ref 70–99)
Glucose-Capillary: 170 mg/dL — ABNORMAL HIGH (ref 70–99)
Glucose-Capillary: 177 mg/dL — ABNORMAL HIGH (ref 70–99)
Glucose-Capillary: 209 mg/dL — ABNORMAL HIGH (ref 70–99)

## 2020-08-07 LAB — BRAIN NATRIURETIC PEPTIDE: B Natriuretic Peptide: 301.3 pg/mL — ABNORMAL HIGH (ref 0.0–100.0)

## 2020-08-07 MED ORDER — METOLAZONE 2.5 MG PO TABS
5.0000 mg | ORAL_TABLET | Freq: Once | ORAL | Status: AC
  Administered 2020-08-07: 5 mg via ORAL
  Filled 2020-08-07: qty 2

## 2020-08-07 MED ORDER — ARTIFICIAL TEARS OPHTHALMIC OINT
1.0000 "application " | TOPICAL_OINTMENT | Freq: Three times a day (TID) | OPHTHALMIC | Status: DC
  Administered 2020-08-07 – 2020-08-09 (×6): 1 via OPHTHALMIC
  Filled 2020-08-07: qty 3.5

## 2020-08-07 MED ORDER — VECURONIUM BROMIDE 10 MG IV SOLR
0.1000 mg/kg | INTRAVENOUS | Status: DC | PRN
  Administered 2020-08-07 – 2020-08-08 (×7): 8.9 mg via INTRAVENOUS
  Filled 2020-08-07 (×6): qty 10

## 2020-08-07 MED ORDER — SODIUM CHLORIDE 0.9 % IV SOLN
200.0000 mg | Freq: Once | INTRAVENOUS | Status: AC
  Administered 2020-08-07: 200 mg via INTRAVENOUS
  Filled 2020-08-07: qty 200

## 2020-08-07 MED ORDER — SODIUM CHLORIDE 0.9 % IV SOLN
100.0000 mg | INTRAVENOUS | Status: DC
  Administered 2020-08-08: 100 mg via INTRAVENOUS
  Filled 2020-08-07: qty 100

## 2020-08-07 MED ORDER — FUROSEMIDE 10 MG/ML IJ SOLN
40.0000 mg | Freq: Once | INTRAMUSCULAR | Status: AC
  Administered 2020-08-07: 40 mg via INTRAVENOUS
  Filled 2020-08-07: qty 4

## 2020-08-07 MED ORDER — SODIUM CHLORIDE 0.9 % IV SOLN
2.0000 g | Freq: Three times a day (TID) | INTRAVENOUS | Status: DC
  Administered 2020-08-07 – 2020-08-08 (×4): 2 g via INTRAVENOUS
  Filled 2020-08-07 (×4): qty 2

## 2020-08-07 MED ORDER — VECURONIUM BROMIDE 10 MG IV SOLR
INTRAVENOUS | Status: AC
  Filled 2020-08-07: qty 10

## 2020-08-07 NOTE — Progress Notes (Signed)
NAME:  Bradley [name], MRN:  [id number], DOB:  [birth year], LOS: 10 ADMISSION DATE:  06/30/2020, CONSULTATION DATE:  07/29/20 REFERRING MD:  Jeni Salles CHIEF COMPLAINT:  Resp Distress   Brief History   59 year old, ex-smoker, admitted 9/29 with severe acute hypoxic respiratory failure and bilateral infiltrates/community-acquired pneumonia that failed outpatient therapy. Covid and flu negative.  PFT in 2017 PFTs showing isolated decrease in DLCO-felt to have a combination of emphysema noted on CT and RB ILD/fibrosis.  He was hospitalized 07/2016 for ARDS, no infectious etiology identified, autoimmune serologies were negative as except for isolated increase in RA factor, CCP negative. He required a tracheostomy but recovered well from this illness and did not need long-term oxygen.  He has been lost to follow-up from the pulmonary clinic since then.  CXR in February 2018 which shows clearing of bilateral infiltrates.  Past Medical History  Emphysema/Pulmonary Fibrosis Anxiety Kidney Stone Pneumonia Diabetes Mellitus Type II  Significant Hospital Events   9/29 Admit  9/30 Intubated   10/02 Inhaled NO initiated 10/05 Weaned off NO overnight. Needing sedation + PRN paralytics for frequent dyssynchrony and desaturation  10/06 FiO2 weaned to 40%, PEEP 5. Failed SBT with tachypnea, desaturation. Off levophed.  10/07 Ventilator dyssynschrony overnight, CXR unchanged, sedation increased  Consults:  PCCM  Procedures:  ETT 9/30 >>   Significant Diagnostic Tests:  PFT 11/ 2016 >> isolated reduction in dlco to 59% Spirometry 12/06/2016 >> FEV1 2.37 (65%) Ratio 62 ,FVC 3.84 (81%) CT chest 07/14/15 >> c/w RBILD or DIP, not UIP CT angio chest 07/2016 >>Diffuse bilateral regional ground-glass airspace opacification, with underlying emphysema and interstitial prominence. This is markedly worsened from 2016, reflecting acute airspace disease superimposed on the patient's fibrotic change. Enlarged  mediastinal nodes, measuring up to 1.9 cm   LE Venous duplex 9/29 >> negative Echo 9/30 >> LV EF 55%. Interventricular septal flattening in systole and diastole consistent with RV pressure and volume overload. RV-RA gradient is severely elevated at consistent with pulmonary hypertension. RV systolic function is moderately to severely reduced. RV size mildly enlarged. No valvular issues. CTA Chest 10/1 >> Negative for PE. Diffuse parenchymal ground-glass opacities bilaterally. Emphysema. Enlarged pulmonary trunk concerning for PH.   Micro Data:  Covid 9/29 >> Negative Influenza 9/29 >> Negative MRSA PCR 9/30 >> Negative Sputume 10/1 > few candida tropicalis RVP 10/1 >> negative BCx2 9/29 >> negative  Tracheal aspirate 10/1 >> few candida tropicalis   Antimicrobials:  Azithromycin 9/29 >> 10/4 Cefepime 9/29 >> 10/6 Vancomycin 9/29 >> 10/1  Eraxis 10/9  Interim history/subjective:  Continues to have ventilator dyssynchrony.  Remains on pressors No acute events overnight  Objective   Blood pressure 92/62, pulse 100, temperature 99.6 F (37.6 C), temperature source Axillary, resp. rate 15, height 5\' 9"  (1.753 m), weight 88.7 kg, SpO2 93 %.    Vent Mode: SIMV/PC/PS FiO2 (%):  [80 %-100 %] 90 % Set Rate:  [15 bmp] 15 bmp PEEP:  [10 cmH20] 10 cmH20 Pressure Support:  [15 cmH20] 15 cmH20 Plateau Pressure:  [11 cmH20-33 cmH20] 29 cmH20   Intake/Output Summary (Last 24 hours) at 08/07/2020 1130 Last data filed at 08/07/2020 1000 Gross per 24 hour  Intake 4406.12 ml  Output 3550 ml  Net 856.12 ml   Filed Weights   08/05/20 0400 08/06/20 0348 08/07/20 0500  Weight: 83.8 kg 85.2 kg 88.7 kg   Examination:  General: adult male lying in bed in NAD on vent, diaphoretic HEENT: MM pink/moist, ETT Neuro:  sedate CV: s1s2 rrr, no m/r/g PULM: lungs bilaterally coarse, rhonchorus  GI: soft, bsx4 active  Extremities: warm/dry, trace dependent edema  Skin: no rashes or  lesions  Resolved Hospital Problem list   Septic Shock secondary to PNA  Assessment & Plan:   Acute Hypoxemic and Hypercapnic Respiratory Failure Pulmonary Fibrosis, Pulmonary Hypertension In setting of underlying pulmonary emphysema with fibrosis and pulmonary hypertension presenting with pneumonia. Concern for shunt physiology regarding the pneumonia and underlying lung disease.     -SIMV Lung protective ventilation measures -wean PEEP / FiO2 for sats >90% -follow intermittent CXR  -monitor off abx -RASS goal -2 to -3 with ventilator synchrony  -brovana, pulmicort + yupelri -Continue solumedrol 60 mg IV QD -D9 on vent, may need to discuss trach with family if able to reduce vent requirements -diuresing well  Sedation Needs while on Mechanical Ventilation  -fentanyl, propofol per PAD protocol  -add PT oxycodone in an attempt to wean IV sedation   Diabetes Mellitus -SSI, resistant scale  -TF coverage 5 units Q4 -lantus 17 units QD   Hypernatremia -monitor for now -free water discontinued  Falsely Elevated Hypertriglyceridemia -peripheral lab draw on 10/7 181 > labs prior (625, 399) to that drawn from central access where propofol is infusing   Depression -hold home cymbalta, resume when able to take PO   Best practice:  Diet: TF Pain/Anxiety/Delirium protocol (if indicated): PAD Protocol   VAP protocol (if indicated): in place  DVT prophylaxis: Lovenox GI prophylaxis: PPI   Glucose control: SSI Mobility: Bed Rest Code Status: Full Code  Family Communication: Wife is quite ill so please call daughter for updates.  Spoke with Daughter Carollee Herter) 10/9. Disposition: ICU  Labs    CBC: Recent Labs  Lab 08/03/20 0333 08/03/20 0434 08/04/20 0312 08/04/20 0312 08/04/20 0332 08/05/20 0400 08/06/20 0341 08/06/20 1239 08/07/20 0535  WBC 12.5*  --  16.3*  --   --  13.0* 14.9*  --  18.3*  HGB 13.1   < > 13.4   < > 14.3 12.6* 13.0 14.3 12.9*  HCT 41.2   < > 43.1   <  > 42.0 40.8 41.8 42.0 41.5  MCV 93.0  --  92.9  --   --  93.6 93.5  --  92.8  PLT 214  --  220  --   --  179 153  --  175   < > = values in this interval not displayed.    Basic Metabolic Panel: Recent Labs  Lab 07/31/20 1518 07/31/20 1518 08/01/20 0321 08/01/20 3845 08/02/20 0340 08/02/20 0503 08/03/20 3646 08/03/20 0434 08/04/20 8032 08/04/20 0312 08/04/20 0332 08/05/20 0400 08/06/20 0341 08/06/20 1239 08/07/20 0535  NA 148*   < > 149*   < > 146*   < > 149*   < > 143   < > 145 145 141 136 140  K 4.6   < > 4.8   < > 4.7   < > 4.4   < > 4.5   < > 4.3 4.3 3.8 4.4 4.5  CL 112*   < > 111  --  105   < > 105  --  100  --   --  101 97*  --  95*  CO2 27   < > 29  --  33*   < > 34*  --  34*  --   --  36* 35*  --  35*  GLUCOSE 174*   < > 189*  --  224*   < > 157*  --  152*  --   --  164* 117*  --  154*  BUN 62*   < > 59*  --  56*   < > 52*  --  54*  --   --  48* 43*  --  57*  CREATININE 1.47*   < > 1.21  --  1.05   < > 0.94  --  0.80  --   --  0.77 0.71  --  0.90  CALCIUM 8.4*   < > 8.0*  --  8.5*   < > 9.1  --  9.2  --   --  9.2 8.8*  --  9.0  MG  --   --  2.7*  --  2.7*  --  2.6*  --  2.6*  --   --   --   --   --   --   PHOS 3.7  --   --   --   --   --   --   --  4.6  --   --   --   --   --   --    < > = values in this interval not displayed.   GFR: Estimated Creatinine Clearance: 97.4 mL/min (by C-G formula based on SCr of 0.9 mg/dL). Recent Labs  Lab 08/01/20 0740 08/02/20 0340 08/04/20 0312 08/05/20 0400 08/06/20 0341 08/07/20 0535  PROCALCITON 0.88  --   --   --   --   --   WBC  --    < > 16.3* 13.0* 14.9* 18.3*   < > = values in this interval not displayed.    Liver Function Tests: No results for input(s): AST, ALT, ALKPHOS, BILITOT, PROT, ALBUMIN in the last 168 hours. No results for input(s): LIPASE, AMYLASE in the last 168 hours. No results for input(s): AMMONIA in the last 168 hours.  ABG    Component Value Date/Time   PHART 7.412 08/06/2020 1239    PCO2ART 64.1 (H) 08/06/2020 1239   PO2ART 59 (L) 08/06/2020 1239   HCO3 40.8 (H) 08/06/2020 1239   TCO2 43 (H) 08/06/2020 1239   ACIDBASEDEF 4.0 (H) 07/30/2020 1005   O2SAT 89.0 08/06/2020 1239     Coagulation Profile: No results for input(s): INR, PROTIME in the last 168 hours.  Cardiac Enzymes: No results for input(s): CKTOTAL, CKMB, CKMBINDEX, TROPONINI in the last 168 hours.  HbA1C: Hgb A1c MFr Bld  Date/Time Value Ref Range Status  07/30/2020 11:51 AM 6.9 (H) 4.8 - 5.6 % Final    Comment:    (NOTE) Pre diabetes:          5.7%-6.4%  Diabetes:              >6.4%  Glycemic control for   <7.0% adults with diabetes   09/16/2016 04:40 AM 6.9 (H) 4.8 - 5.6 % Final    Comment:    (NOTE)         Pre-diabetes: 5.7 - 6.4         Diabetes: >6.4         Glycemic control for adults with diabetes: <7.0     CBG: Recent Labs  Lab 08/06/20 1928 08/06/20 2344 08/07/20 0430 08/07/20 0709 08/07/20 1111  GLUCAP 220* 178* 136* 150* 136*    Critical care time: 50 minutes    Melody Comas, MD [name] Office: [phone]   See Amion for Pager Details

## 2020-08-07 NOTE — Progress Notes (Signed)
Pt started on iNO at 20 ppm per MD order.

## 2020-08-08 DIAGNOSIS — J9602 Acute respiratory failure with hypercapnia: Secondary | ICD-10-CM

## 2020-08-08 DIAGNOSIS — Z66 Do not resuscitate: Secondary | ICD-10-CM

## 2020-08-08 DIAGNOSIS — Z515 Encounter for palliative care: Secondary | ICD-10-CM

## 2020-08-08 LAB — BASIC METABOLIC PANEL
Anion gap: 9 (ref 5–15)
BUN: 53 mg/dL — ABNORMAL HIGH (ref 6–20)
CO2: 40 mmol/L — ABNORMAL HIGH (ref 22–32)
Calcium: 8.9 mg/dL (ref 8.9–10.3)
Chloride: 93 mmol/L — ABNORMAL LOW (ref 98–111)
Creatinine, Ser: 0.72 mg/dL (ref 0.61–1.24)
GFR, Estimated: >60 mL/min (ref >60)
Glucose, Bld: 174 mg/dL — ABNORMAL HIGH (ref 70–99)
Potassium: 3.8 mmol/L (ref 3.5–5.1)
Sodium: 142 mmol/L (ref 135–145)

## 2020-08-08 LAB — TRIGLYCERIDES: Triglycerides: 179 mg/dL — ABNORMAL HIGH (ref <150)

## 2020-08-08 LAB — CBC
HCT: 38.7 % — ABNORMAL LOW (ref 39.0–52.0)
Hemoglobin: 11.9 g/dL — ABNORMAL LOW (ref 13.0–17.0)
MCH: 28 pg (ref 26.0–34.0)
MCHC: 30.7 g/dL (ref 30.0–36.0)
MCV: 91.1 fL (ref 80.0–100.0)
Platelets: 169 10*3/uL (ref 150–400)
RBC: 4.25 MIL/uL (ref 4.22–5.81)
RDW: 14 % (ref 11.5–15.5)
WBC: 14.8 10*3/uL — ABNORMAL HIGH (ref 4.0–10.5)
nRBC: 0 % (ref 0.0–0.2)

## 2020-08-08 LAB — GLUCOSE, CAPILLARY
Glucose-Capillary: 207 mg/dL — ABNORMAL HIGH (ref 70–99)
Glucose-Capillary: 207 mg/dL — ABNORMAL HIGH (ref 70–99)
Glucose-Capillary: 212 mg/dL — ABNORMAL HIGH (ref 70–99)

## 2020-08-08 MED ORDER — FUROSEMIDE 10 MG/ML IJ SOLN
40.0000 mg | Freq: Two times a day (BID) | INTRAMUSCULAR | Status: DC
  Administered 2020-08-08 – 2020-08-09 (×3): 40 mg via INTRAVENOUS
  Filled 2020-08-08 (×3): qty 4

## 2020-08-08 MED ORDER — INSULIN GLARGINE 100 UNIT/ML ~~LOC~~ SOLN
25.0000 [IU] | Freq: Every day | SUBCUTANEOUS | Status: DC
  Administered 2020-08-08: 25 [IU] via SUBCUTANEOUS
  Filled 2020-08-08: qty 0.25

## 2020-08-08 MED ORDER — METOLAZONE 2.5 MG PO TABS
5.0000 mg | ORAL_TABLET | Freq: Two times a day (BID) | ORAL | Status: DC
  Administered 2020-08-08 – 2020-08-09 (×3): 5 mg
  Filled 2020-08-08 (×3): qty 2

## 2020-08-08 MED ORDER — METOLAZONE 2.5 MG PO TABS: 5.0000 mg | ORAL_TABLET | Freq: Two times a day (BID) | ORAL | Status: DC

## 2020-08-08 NOTE — Progress Notes (Signed)
Family Meeting: Met with patient's daughter, Bradley [name], and family to discuss Bradley [name]'s condition. They understand that he will likely not survive this hospitalization due to his lung function. His sister was available over the phone during the discussion and she shed light on conversations she had with [facility] previously, expressing that he would not want to be on a ventilator long term or even be on supplemental oxygen as an outpatient. They made the decision to make him a DNR/DNI and proceed with transition to comfort measures tomorrow at 9am. They have also expressed wishes to make his wife DNR/DNI who has been suffering from metastatic cancer.   These wishes were shared with the palliative care team and we will make arrangements to have [facility] and his wife transitioned to comfort measures in the same room if possible.   Bradley Jackson, MD [facility] Office: [phone]   See Amion for Pager Details

## 2020-08-08 NOTE — Consult Note (Signed)
Consultation Note Date: 08/08/2020   Patient Name: Bradley [name]  DOB: 04/21/1961  MRN: [id number]  Age / Sex: 59 y.o., male  PCP: Wenda Low, MD Referring Physician: Freddi Starr, MD  Reason for Consultation: Non pain symptom management, Pain control, Psychosocial/spiritual support and Withdrawal of life-sustaining treatment  HPI/Patient Profile: 59 y.o. male  with past medical history of pulmonary fibrosis, COPD, DM II, anxiety who was admitted on 07/10/2020 with multifocal pneumonia. He required intubation shortly after admission.  He has intermittently required nitrous oxide due to pulmonary HTN, and PRN paralytics for vent dyssynchrony.  Unfortunately after 11 days of intense hospital support Catalina [place]igua has not improved and is continues to require more support.  His family has agreed with comfort measures.  Clinical Assessment and Goals of Care:  I have reviewed medical records including EPIC notes, labs and imaging, received report from Dr. Erin Fulling and ICU RN Alver Fisher, examined the patient and met at bedside with his step daughter Liborio Nixon, her daughter and additional family members to discuss diagnosis prognosis, [place], EOL wishes, disposition and options.  Unfortunately Larene Beach is facing the daunting decision to shift to comfort measures for both of her parents who are currently on life support and not expected to improve.  Larene Beach requests that her parents be moved into the same room and compassionately extubated together at 9:00 on 10/11.  We talked together with the family and the RN about what would happen.  Shannon's top concern is that both of her parent's are comfortable and do not suffer.  She requests that a Chaplain be present at the time of the extubations.  Questions and concerns were addressed.  The family was encouraged to call with questions or concerns.    Primary Decision Maker:   NEXT OF KIN step daughter Liborio Nixon.    SUMMARY OF RECOMMENDATIONS    Code Status/Advance Care Planning:  DNR   Symptom Management:   Liberalize comfort medications.  Additional Recommendations (Limitations, Scope, Preferences):  Full Comfort Care and No Glucose Monitoring  Palliative Prophylaxis:   Frequent Pain Assessment  Psycho-social/Spiritual:   Desire for further Chaplaincy support:  Requested.  Prognosis:   Minutes to hours after extubation.    Discharge Planning: Anticipated Hospital Death      Primary Diagnoses: Present on Admission: . PNA (pneumonia) . Community acquired pneumonia . COPD GOLD II/centrilobular and paraseptal emphysema . Pulmonary fibrosis/Not UIP pattern . Sepsis --due to pneumonia . Severe sepsis ([place])   I have reviewed the medical record, interviewed the patient and family, and examined the patient. The following aspects are pertinent.  Past Medical History:  Diagnosis Date  . Anxiety    Citalopram controls "panic attacks"  . Asthma   . COPD (chronic obstructive pulmonary disease) ([place])   . Diabetes mellitus type 2 without retinopathy ([place])   . Dyspnea    with exertion  . History of kidney stones    x3-4 - lithotripsy  . Pneumonia    08/11/16- 3 years ago  . Pulmonary fibrosis (  [place])    Social History   Socioeconomic History  . Marital status: Married    Spouse name: Not on file  . Number of children: Not on file  . Years of education: Not on file  . Highest education level: Not on file  Occupational History  . Occupation: Passenger transport manager  Tobacco Use  . Smoking status: Former Smoker    Packs/day: 1.00    Years: 30.00    Pack years: 30.00    Types: Cigarettes    Quit date: 08/16/2016    Years since quitting: 3.9  . Smokeless tobacco: Never Used  Substance and Sexual Activity  . Alcohol use: No    Alcohol/week: 0.0 standard drinks  . Drug use: No  . Sexual activity: Not on file  Other Topics  Concern  . Not on file  Social History Narrative  . Not on file   Social Determinants of Health   Financial Resource Strain:   . Difficulty of Paying Living Expenses: Not on file  Food Insecurity:   . Worried About Charity fundraiser in the Last Year: Not on file  . Ran Out of Food in the Last Year: Not on file  Transportation Needs:   . Lack of Transportation (Medical): Not on file  . Lack of Transportation (Non-Medical): Not on file  Physical Activity:   . Days of Exercise per Week: Not on file  . Minutes of Exercise per Session: Not on file  Stress:   . Feeling of Stress : Not on file  Social Connections:   . Frequency of Communication with Friends and Family: Not on file  . Frequency of Social Gatherings with Friends and Family: Not on file  . Attends Religious Services: Not on file  . Active Member of Clubs or Organizations: Not on file  . Attends Archivist Meetings: Not on file  . Marital Status: Not on file   Family History  Problem Relation Age of Onset  . Heart disease Mother   . Colon cancer Mother     Allergies  Allergen Reactions  . Wellbutrin [Bupropion] Cough    Review of Systems   Physical Exam   Vital Signs: BP (!) 95/58   Pulse 84   Temp 99.3 F (37.4 C) (Oral)   Resp (!) 24   Ht $R'5\' 9"'gZ$  (1.753 m)   Wt 85.4 kg   SpO2 100%   BMI 27.80 kg/m  Pain Scale: CPOT   Pain Score: 0-No pain   SpO2: SpO2: 100 % O2 Device:SpO2: 100 % O2 Flow Rate: .O2 Flow Rate (L/min): 15 L/min    Palliative Assessment/Data: 10%     Time In: 2:00 Time Out: 3:00 Time Total: 60 min. Visit consisted of counseling and education dealing with the complex and emotionally intense issues surrounding the need for palliative care and symptom management in the setting of serious and potentially life-threatening illness. Greater than 50%  of this time was spent counseling and coordinating care related to the above assessment and plan.  Signed by: Florentina Jenny, PA-C Palliative Medicine  Please contact Palliative Medicine Team phone at [phone] for questions and concerns.  For individual provider: See Shea Evans

## 2020-08-08 NOTE — Progress Notes (Signed)
NAME:  RIPKEN REKOWSKI, MRN:  [id number], DOB:  30-Oct-1961, LOS: 11 ADMISSION DATE:  Aug 27, 2020, CONSULTATION DATE:  07/29/20 REFERRING MD:  Jeni Salles CHIEF COMPLAINT:  Resp Distress   Brief History   59 year old, ex-smoker, admitted 9/29 with severe acute hypoxic respiratory failure and bilateral infiltrates/community-acquired pneumonia that failed outpatient therapy. Covid and flu negative.  PFT in 2017 PFTs showing isolated decrease in DLCO-felt to have a combination of emphysema noted on CT and RB ILD/fibrosis.  He was hospitalized 07/2016 for ARDS, no infectious etiology identified, autoimmune serologies were negative as except for isolated increase in RA factor, CCP negative. He required a tracheostomy but recovered well from this illness and did not need long-term oxygen.  He has been lost to follow-up from the pulmonary clinic since then.  CXR in February 2018 which shows clearing of bilateral infiltrates.  Past Medical History  Emphysema/Pulmonary Fibrosis Anxiety Kidney Stone Pneumonia Diabetes Mellitus Type II  Significant Hospital Events   9/29 Admit  9/30 Intubated   10/02 Inhaled NO initiated 10/05 Weaned off NO overnight. Needing sedation + PRN paralytics for frequent dyssynchrony and desaturation  10/06 FiO2 weaned to 40%, PEEP 5. Failed SBT with tachypnea, desaturation. Off levophed.  10/07 Ventilator dyssynschrony overnight, CXR unchanged, sedation increased 10/9 Inhaled NO re-initiated along with PRN paralytics  Consults:  PCCM  Procedures:  ETT 9/30 >>   Significant Diagnostic Tests:  PFT 11/ 2016 >> isolated reduction in dlco to 59% Spirometry 12/06/2016 >> FEV1 2.37 (65%) Ratio 62 ,FVC 3.84 (81%) CT chest 07/14/15 >> c/w RBILD or DIP, not UIP CT angio chest 07/2016 >>Diffuse bilateral regional ground-glass airspace opacification, with underlying emphysema and interstitial prominence. This is markedly worsened from 2016, reflecting acute airspace disease  superimposed on the patient's fibrotic change. Enlarged mediastinal nodes, measuring up to 1.9 cm   LE Venous duplex 9/29 >> negative Echo 9/30 >> LV EF 55%. Interventricular septal flattening in systole and diastole consistent with RV pressure and volume overload. RV-RA gradient is severely elevated at consistent with pulmonary hypertension. RV systolic function is moderately to severely reduced. RV size mildly enlarged. No valvular issues. CTA Chest 10/1 >> Negative for PE. Diffuse parenchymal ground-glass opacities bilaterally. Emphysema. Enlarged pulmonary trunk concerning for PH.   Micro Data:  Covid 9/29 >> Negative Influenza 9/29 >> Negative MRSA PCR 9/30 >> Negative Sputume 10/1 > few candida tropicalis RVP 10/1 >> negative BCx2 9/29 >> negative  Tracheal aspirate 10/1 >> few candida tropicalis  Tracheal aspirate 10/9>>  Antimicrobials:  Azithromycin 9/29 >> 10/4 Cefepime 9/29 >> 10/6 Vancomycin 9/29 >> 10/1  Eraxis 10/9 Cefepime 10/9  Interim history/subjective:  Pressor requirements decreasing  Received 3 doses of vecuronium overnight  Objective   Blood pressure 121/67, pulse 94, temperature 99.2 F (37.3 C), temperature source Oral, resp. rate 17, height 5\' 9"  (1.753 m), weight 85.4 kg, SpO2 91 %.    Vent Mode: SIMV/PC/PS FiO2 (%):  [70 %-100 %] 70 % Set Rate:  [24 bmp] 24 bmp PEEP:  [10 cmH20] 10 cmH20 Pressure Support:  [15 cmH20] 15 cmH20 Plateau Pressure:  [32 cmH20-36 cmH20] 32 cmH20   Intake/Output Summary (Last 24 hours) at 08/08/2020 0841 Last data filed at 08/08/2020 0800 Gross per 24 hour  Intake 4479.03 ml  Output 4500 ml  Net -20.97 ml   Filed Weights   08/06/20 0348 08/07/20 0500 08/08/20 0147  Weight: 85.2 kg 88.7 kg 85.4 kg   Examination:  General: adult male lying in bed  in NAD on vent HEENT: MM pink/moist, ETT Neuro: sedate CV: s1s2 rrr, no m/r/g PULM: lungs bilaterally coarse GI: soft, bsx4 active  Extremities: warm/dry,  trace dependent edema  Skin: no rashes or lesions  Resolved Hospital Problem list     Assessment & Plan:   Acute Hypoxemic and Hypercapnic Respiratory Failure Pulmonary Fibrosis, Pulmonary Hypertension In setting of underlying pulmonary emphysema with fibrosis and pulmonary hypertension presenting with pneumonia. Concern for shunt physiology regarding the pneumonia and underlying lung disease.     -SIMV Lung protective ventilation measures -wean PEEP / FiO2 for sats >90% - Continue fentanyl, propofol and precedex. PRN Vecuronium. Oxycodone 5mg  q8hrs. Per tube. - Continue inhaled NO at 20ppm -follow intermittent CXR  -RASS goal -2 to -3 with ventilator synchrony  -brovana, pulmicort + yupelri -Continue solumedrol 60 mg IV QD -D10 on vent, discussed with family yesterday that his vent requirements are too high to proceed with trach -Continue diuresis with lasix + metolazone  - Started cefepime and eraxis yesterday for tracheal aspirate gram stain showing yeast and GPCs, follow up final culture  Shock -septic vs cardiogenic -Wean levophed as able - Antibiotics as above - diuresis as above  Diabetes Mellitus -SSI, resistant scale  -TF coverage 5 units Q4 -lantus 25 units QD increased from 17 today  Hypernatremia -monitor for now -free water discontinued -lasix + metolazone  Falsely Elevated Hypertriglyceridemia -peripheral lab draw on 10/7 181 > labs prior (625, 399) to that drawn from central access where propofol is infusing   Depression -hold home cymbalta, resume when able to take PO   Best practice:  Diet: TF Pain/Anxiety/Delirium protocol (if indicated): PAD Protocol   VAP protocol (if indicated): in place  DVT prophylaxis: Lovenox GI prophylaxis: PPI   Glucose control: SSI Mobility: Bed Rest Code Status: Full Code  Family Communication: Wife is quite ill so please call daughter for updates.  Spoke with Daughter 12/7) 10/9. Disposition: ICU  Labs     CBC: Recent Labs  Lab 08/04/20 0312 08/04/20 0332 08/05/20 0400 08/06/20 0341 08/06/20 1239 08/07/20 0535 08/08/20 0306  WBC 16.3*  --  13.0* 14.9*  --  18.3* 14.8*  HGB 13.4   < > 12.6* 13.0 14.3 12.9* 11.9*  HCT 43.1   < > 40.8 41.8 42.0 41.5 38.7*  MCV 92.9  --  93.6 93.5  --  92.8 91.1  PLT 220  --  179 153  --  175 169   < > = values in this interval not displayed.    Basic Metabolic Panel: Recent Labs  Lab 08/02/20 0340 08/02/20 0503 08/03/20 0333 08/03/20 0434 08/04/20 0312 08/04/20 0332 08/05/20 0400 08/05/20 0400 08/06/20 0341 08/06/20 1239 08/07/20 0535 08/07/20 1816 08/08/20 0306  NA 146*   < > 149*   < > 143   < > 145   < > 141 136 140 138 142  K 4.7   < > 4.4   < > 4.5   < > 4.3   < > 3.8 4.4 4.5 4.3 3.8  CL 105   < > 105   < > 100   < > 101  --  97*  --  95* 94* 93*  CO2 33*   < > 34*   < > 34*   < > 36*  --  35*  --  35* 36* 40*  GLUCOSE 224*   < > 157*   < > 152*   < > 164*  --  117*  --  154* 225* 174*  BUN 56*   < > 52*   < > 54*   < > 48*  --  43*  --  57* 54* 53*  CREATININE 1.05   < > 0.94   < > 0.80   < > 0.77  --  0.71  --  0.90 0.86 0.72  CALCIUM 8.5*   < > 9.1   < > 9.2   < > 9.2  --  8.8*  --  9.0 8.0* 8.9  MG 2.7*  --  2.6*  --  2.6*  --   --   --   --   --   --   --   --   PHOS  --   --   --   --  4.6  --   --   --   --   --   --   --   --    < > = values in this interval not displayed.   GFR: Estimated Creatinine Clearance: 107.7 mL/min (by C-G formula based on SCr of 0.72 mg/dL). Recent Labs  Lab 08/05/20 0400 08/06/20 0341 08/07/20 0535 08/07/20 1148 08/08/20 0306  PROCALCITON  --   --   --  3.45  --   WBC 13.0* 14.9* 18.3*  --  14.8*    Liver Function Tests: No results for input(s): AST, ALT, ALKPHOS, BILITOT, PROT, ALBUMIN in the last 168 hours. No results for input(s): LIPASE, AMYLASE in the last 168 hours. No results for input(s): AMMONIA in the last 168 hours.  ABG    Component Value Date/Time   PHART 7.412  08/06/2020 1239   PCO2ART 64.1 (H) 08/06/2020 1239   PO2ART 59 (L) 08/06/2020 1239   HCO3 40.8 (H) 08/06/2020 1239   TCO2 43 (H) 08/06/2020 1239   ACIDBASEDEF 4.0 (H) 07/30/2020 1005   O2SAT 89.0 08/06/2020 1239     Coagulation Profile: No results for input(s): INR, PROTIME in the last 168 hours.  Cardiac Enzymes: No results for input(s): CKTOTAL, CKMB, CKMBINDEX, TROPONINI in the last 168 hours.  HbA1C: Hgb A1c MFr Bld  Date/Time Value Ref Range Status  07/30/2020 11:51 AM 6.9 (H) 4.8 - 5.6 % Final    Comment:    (NOTE) Pre diabetes:          5.7%-6.4%  Diabetes:              >6.4%  Glycemic control for   <7.0% adults with diabetes   09/16/2016 04:40 AM 6.9 (H) 4.8 - 5.6 % Final    Comment:    (NOTE)         Pre-diabetes: 5.7 - 6.4         Diabetes: >6.4         Glycemic control for adults with diabetes: <7.0     CBG: Recent Labs  Lab 08/07/20 1526 08/07/20 1947 08/07/20 2338 08/08/20 0348 08/08/20 0727  GLUCAP 170* 209* 177* 207* 207*    Critical care time: 40 minutes    Melody Comas, MD [facility] Office: [phone]   See Amion for Pager Details

## 2020-08-09 DIAGNOSIS — J9601 Acute respiratory failure with hypoxia: Secondary | ICD-10-CM

## 2020-08-09 LAB — GLUCOSE, CAPILLARY
Glucose-Capillary: 143 mg/dL — ABNORMAL HIGH (ref 70–99)
Glucose-Capillary: 150 mg/dL — ABNORMAL HIGH (ref 70–99)

## 2020-08-09 MED ORDER — SODIUM CHLORIDE 0.9 % IV SOLN
6.0000 mg/h | INTRAVENOUS | Status: DC
  Administered 2020-08-09: 5 mg/h via INTRAVENOUS
  Filled 2020-08-09: qty 10

## 2020-08-09 MED ORDER — HALOPERIDOL LACTATE 2 MG/ML PO CONC
0.5000 mg | ORAL | Status: DC | PRN
  Filled 2020-08-09: qty 0.3

## 2020-08-09 MED ORDER — GLYCOPYRROLATE 1 MG PO TABS: 1.0000 mg | ORAL_TABLET | ORAL | Status: DC | PRN

## 2020-08-09 MED ORDER — GLYCOPYRROLATE 0.2 MG/ML IJ SOLN: 0.2000 mg | INTRAMUSCULAR | Status: DC | PRN

## 2020-08-09 MED ORDER — HYDROMORPHONE BOLUS VIA INFUSION
1.0000 mg | INTRAVENOUS | Status: DC | PRN
  Administered 2020-08-09 (×4): 1 mg via INTRAVENOUS
  Filled 2020-08-09: qty 1

## 2020-08-09 MED ORDER — HALOPERIDOL 0.5 MG PO TABS
0.5000 mg | ORAL_TABLET | ORAL | Status: DC | PRN
  Filled 2020-08-09: qty 1

## 2020-08-09 MED ORDER — ONDANSETRON 4 MG PO TBDP: 4.0000 mg | ORAL_TABLET | Freq: Four times a day (QID) | ORAL | Status: DC | PRN

## 2020-08-09 MED ORDER — MIDAZOLAM HCL 2 MG/2ML IJ SOLN
4.0000 mg | INTRAMUSCULAR | Status: DC | PRN
  Administered 2020-08-09 (×4): 4 mg via INTRAVENOUS
  Filled 2020-08-09 (×4): qty 4

## 2020-08-09 MED ORDER — ACETAMINOPHEN 650 MG RE SUPP: 650.0000 mg | Freq: Four times a day (QID) | RECTAL | Status: DC | PRN

## 2020-08-09 MED ORDER — DIPHENHYDRAMINE HCL 50 MG/ML IJ SOLN: 25.0000 mg | INTRAMUSCULAR | Status: DC | PRN

## 2020-08-09 MED ORDER — POLYVINYL ALCOHOL 1.4 % OP SOLN
1.0000 [drp] | Freq: Four times a day (QID) | OPHTHALMIC | Status: DC | PRN
  Filled 2020-08-09: qty 15

## 2020-08-09 MED ORDER — ONDANSETRON HCL 4 MG/2ML IJ SOLN: 4.0000 mg | Freq: Four times a day (QID) | INTRAMUSCULAR | Status: DC | PRN

## 2020-08-09 MED ORDER — DEXTROSE 5 % IV SOLN: INTRAVENOUS | Status: DC

## 2020-08-09 MED ORDER — BIOTENE DRY MOUTH MT LIQD: 15.0000 mL | OROMUCOSAL | Status: DC | PRN

## 2020-08-09 MED ORDER — GLYCOPYRROLATE 0.2 MG/ML IJ SOLN
0.4000 mg | Freq: Three times a day (TID) | INTRAMUSCULAR | Status: DC
  Administered 2020-08-09: 0.4 mg via INTRAVENOUS
  Filled 2020-08-09: qty 2

## 2020-08-09 MED ORDER — HYDROMORPHONE BOLUS VIA INFUSION
1.0000 mg | INTRAVENOUS | Status: DC | PRN
  Filled 2020-08-09: qty 1

## 2020-08-09 MED ORDER — ACETAMINOPHEN 325 MG PO TABS: 650.0000 mg | ORAL_TABLET | Freq: Four times a day (QID) | ORAL | Status: DC | PRN

## 2020-08-09 MED ORDER — POLYVINYL ALCOHOL 1.4 % OP SOLN: 1.0000 [drp] | Freq: Four times a day (QID) | OPHTHALMIC | Status: DC | PRN

## 2020-08-09 MED ORDER — HALOPERIDOL LACTATE 5 MG/ML IJ SOLN: 0.5000 mg | INTRAMUSCULAR | Status: DC | PRN

## 2020-08-11 LAB — ALPHA GALACTOSIDASE: Alpha galactosidase, serum: 49.5 nmol/hr/mg prt (ref >35.5)

## 2020-08-11 LAB — CULTURE, RESPIRATORY W GRAM STAIN

## 2020-08-30 NOTE — Progress Notes (Signed)
Comfort orders have been placed.   Family planning to visit at approximately 9:00 am.  Extubation will be directed by RN after all family is in place and have visited.  Please call or page with any questions or concerns.  Norvel Richards, PA-C Palliative Medicine Office:  [phone]  Pager:  [phone]

## 2020-08-30 NOTE — Progress Notes (Signed)
125 ml of Fentanyl wasted in sink with Luvenia Heller, RN.

## 2020-08-30 NOTE — Progress Notes (Signed)
[facility] called per request of bedside RN.  TOD [id number].  CDS referral # [id number].  Pt is suitable for possible tissue donation.   RN notified. Information also noted in doc flowsheet.

## 2020-08-30 NOTE — Death Summary Note (Signed)
DEATH SUMMARY   Patient Details  Name: Bradley [name] MRN: [id number] DOB: May 08, 1961  Admission/Discharge Information   Admit Date:  Aug 07, 2020  Date of Death: Date of Death: August 19, 2020  Time of Death: Time of Death: 0943  Length of Stay: 2023/03/21  Referring Physician: Georgann Housekeeper, MD   Reason(s) for Hospitalization  Acute Hypoxemic and Hypercapnic Respiratory Failure Pulmonary Fibrosis, Pulmonary Hypertension Undifferentiated shock, septic versus cardiogenic Diabetes type 2 Hypernatremia   Diagnoses  Preliminary cause of death:  Secondary Diagnoses (including complications and co-morbidities):  Principal Problem:   Sepsis --due to pneumonia Active Problems:   Pulmonary fibrosis/Not UIP pattern   Acute respiratory failure (HCC)   Community acquired pneumonia   Diabetes mellitus type 2 without retinopathy (HCC)   COPD GOLD II/centrilobular and paraseptal emphysema   PNA (pneumonia)   Severe sepsis (HCC)   History of pulmonary fibrosis   DNR (do not resuscitate)   Comfort measures only status   Acute hypoxemic respiratory failure [facility])   Brief Hospital Course (including significant findings, care, treatment, and services provided and events leading to death)  DARTAGNAN BEAVERS is a 59 y.o. year old male ex-smoker, admitted 08-08-23 with severe acute hypoxic respiratory failure and bilateral infiltrates/community-acquired pneumonia that failed outpatient therapy. Covid and flu negative.  PFT in 03/09/2016 PFTs showing isolated decrease in DLCO-felt to have a combination of emphysema noted on CT and RB ILD/fibrosis.He was hospitalized 07/2016 for ARDS,no infectious etiology identified, autoimmune serologies were negative as except for isolated increase in RA factor, CCP negative. He required a tracheostomy but recovered well from this illness and did not need long-term oxygen.He has been lost to follow-up from the pulmonary clinic since then. CXR in February 2018 which shows clearing of  bilateral infiltrates. Initially patient was started on IV antibiotic therapy for underlying pneumonia, he required endotracheal intubation, despite antibiotic therapy patient's condition continued to get worse, he was continued on Solu-Medrol Brovana Pulmicort and Yupelri.  Also patient had a undifferentiated shock between cardiogenic versus septic, he was continued on vasopressors, antibiotics were continued.  He was diuresed as well.  As patient condition was getting worse, family meeting was arranged, patient's family decided to keep him on comfort care and have palliative extubation done on 08/19/2021, alongside his wife who was also palliatively extubated. Time of death 9:43 AM.   Pertinent Labs and Studies  Significant Diagnostic Studies CT ANGIO CHEST PE W OR WO CONTRAST  Result Date: 07/30/2020 CLINICAL DATA:  Declining respiratory status. EXAM: CT ANGIOGRAPHY CHEST WITH CONTRAST TECHNIQUE: Multidetector CT imaging of the chest was performed using the standard protocol during bolus administration of intravenous contrast. Multiplanar CT image reconstructions and MIPs were obtained to evaluate the vascular anatomy. CONTRAST:  OMNIPAQUE IOHEXOL 350 MG/ML SOLN COMPARISON:  08/17/2016. FINDINGS: Cardiovascular: Assessment of the segmental and subsegmental pulmonary arteries is limited by suboptimal opacification and respiratory motion. No central or lobar pulmonary embolus. Atherosclerotic calcification of the aorta and coronary arteries. Pulmonic trunk and heart are enlarged. No pericardial effusion. Mediastinum/Nodes: Subcarinal lymph node measures 1.6 cm, decreased from 1.9 cm on 08/17/2016. Low left paratracheal lymph node measures 1.6 cm, stable. No hilar or axillary adenopathy. Nasogastric tube is seen in the esophagus. Lungs/Pleura: Extensive centrilobular emphysema with bullous components in the apices. Fairly diffuse pulmonary parenchymal ground-glass with consolidation in the lower  lobes. No pleural fluid. Airway is unremarkable. Upper Abdomen: Visualized portion of the liver is unremarkable. Stones layer in the gallbladder. Adrenal glands and right kidney are  unremarkable. Low-attenuation lesion in the left kidney measures 3.6 cm and is likely a cyst. Visualized portions of the spleen, pancreas, stomach and bowel are unremarkable with exception of a nasogastric tube terminating in the stomach. No upper abdominal adenopathy. Musculoskeletal: Degenerative changes in the spine. No worrisome lytic or sclerotic lesions. Review of the MIP images confirms the above findings. IMPRESSION: 1. Assessment of the segmental and subsegmental pulmonary arteries is limited by suboptimal opacification and respiratory motion. No central or lobar pulmonary embolus. 2. Pulmonary parenchymal pattern of fairly diffuse ground-glass and bilateral lobe consolidation is indicative of pneumonia, including due to COVID-19. 3. Borderline enlarged mediastinal and subcarinal lymph nodes, stable or smaller compared to 08/17/2016. 4. Cholelithiasis. 5.  Emphysema (ICD10-J43.9). 6. Aortic atherosclerosis (ICD10-I70.0). Coronary artery calcification. 7. Enlarged pulmonic trunk, indicative of pulmonary arterial hypertension. Electronically Signed   By: Leanna Battles M.D.   On: 07/30/2020 13:21   US Venous Img Lower Bilateral (DVT)  Result Date: 2020-08-08 CLINICAL DATA:  Pulmonary emboli EXAM: BILATERAL LOWER EXTREMITY VENOUS DOPPLER ULTRASOUND TECHNIQUE: Gray-scale sonography with graded compression, as well as color Doppler and duplex ultrasound were performed to evaluate the lower extremity deep venous systems from the level of the common femoral vein and including the common femoral, femoral, profunda femoral, popliteal and calf veins including the posterior tibial, peroneal and gastrocnemius veins when visible. The superficial great saphenous vein was also interrogated. Spectral Doppler was utilized to evaluate flow  at rest and with distal augmentation maneuvers in the common femoral, femoral and popliteal veins. COMPARISON:  None. FINDINGS: RIGHT LOWER EXTREMITY Common Femoral Vein: No evidence of thrombus. Normal compressibility, respiratory phasicity and response to augmentation. Saphenofemoral Junction: No evidence of thrombus. Normal compressibility and flow on color Doppler imaging. Profunda Femoral Vein: No evidence of thrombus. Normal compressibility and flow on color Doppler imaging. Femoral Vein: No evidence of thrombus. Normal compressibility, respiratory phasicity and response to augmentation. Popliteal Vein: No evidence of thrombus. Normal compressibility, respiratory phasicity and response to augmentation. Calf Veins: No evidence of thrombus. Normal compressibility and flow on color Doppler imaging. LEFT LOWER EXTREMITY Common Femoral Vein: No evidence of thrombus. Normal compressibility, respiratory phasicity and response to augmentation. Saphenofemoral Junction: No evidence of thrombus. Normal compressibility and flow on color Doppler imaging. Profunda Femoral Vein: No evidence of thrombus. Normal compressibility and flow on color Doppler imaging. Femoral Vein: No evidence of thrombus. Normal compressibility, respiratory phasicity and response to augmentation. Popliteal Vein: No evidence of thrombus. Normal compressibility, respiratory phasicity and response to augmentation. Calf Veins: No evidence of thrombus. Normal compressibility and flow on color Doppler imaging. IMPRESSION: No evidence of deep venous thrombosis in either lower extremity. Electronically Signed   By: Judie Petit.  Shick M.D.   On: 08/08/2020 16:57   DG CHEST PORT 1 VIEW  Result Date: 08/07/2020 CLINICAL DATA:  Evaluate pulmonary fibrosis. EXAM: PORTABLE CHEST 1 VIEW COMPARISON:  10/8/202110/720; 08/04/2020; chest CT-07/31/2019 FINDINGS: Grossly unchanged cardiac silhouette and mediastinal contours. Stable position of support apparatus. No  pneumothorax. Grossly unchanged extensive bilateral interstitial opacities with basilar predominance. No new focal airspace opacities. No pleural effusion or pneumothorax. No acute osseous abnormalities. IMPRESSION: 1. Stable positioning of support apparatus. No pneumothorax. 2. Grossly unchanged extensive bilateral interstitial opacities with basilar predominance, potentially representative pulmonary edema superimposed on known advanced emphysematous change though note, atypical infection could have a similar appearance. Electronically Signed   By: Simonne Come M.D.   On: 08/07/2020 13:16   DG CHEST PORT 1 VIEW  Result Date:  08/06/2020 CLINICAL DATA:  Acute respiratory failure with hypoxia EXAM: PORTABLE CHEST 1 VIEW COMPARISON:  Portable exam 1105 hours compared to 08/05/2020 FINDINGS: Tip of endotracheal tube projects 2.3 cm above carina. Nasogastric tube extends into stomach. RIGHT arm PICC line tip projects over SVC. Stable heart size mediastinal contours. Interstitial/reticular infiltrates throughout both lungs greatest at bases, unchanged. No pleural effusion or pneumothorax. Emphysematous changes in upper lobes. IMPRESSION: Persistent BILATERAL pulmonary infiltrates. Electronically Signed   By: Ulyses Southward M.D.   On: 08/06/2020 12:48   DG Chest Port 1 View  Result Date: 08/05/2020 CLINICAL DATA:  Acute respiratory failure EXAM: PORTABLE CHEST 1 VIEW COMPARISON:  08/04/2020 FINDINGS: Cardiac shadow is stable. Endotracheal tube and gastric catheter are stable. Diffuse bilateral airspace opacities are again identified similar to that seen on the prior study. Given some technical variations in the two films, the overall appearance is stable. No bony abnormality is noted. Right-sided PICC line is noted at the cavoatrial junction. IMPRESSION: Overall stable appearance from the prior exam. Electronically Signed   By: Alcide Clever M.D.   On: 08/05/2020 22:06   DG CHEST PORT 1 VIEW  Result Date:  08/04/2020 CLINICAL DATA:  Respiratory failure, hypoxia, intubation EXAM: PORTABLE CHEST 1 VIEW COMPARISON:  08/04/2020, 5:09 a.m. FINDINGS: No significant interval change in AP portable chest radiograph with diffuse interstitial pulmonary opacity superimposed upon emphysema. No new airspace opacity. Support apparatus including endotracheal tube and esophagogastric tube is unchanged. Heart and mediastinum are unremarkable. IMPRESSION: 1. No significant interval change in AP portable chest radiograph with diffuse interstitial pulmonary opacity superimposed upon emphysema. No new airspace opacity. 2. Support apparatus including endotracheal tube and esophagogastric tube is unchanged. Electronically Signed   By: Lauralyn Primes M.D.   On: 08/04/2020 09:04   DG Chest Port 1 View  Result Date: 08/04/2020 CLINICAL DATA:  Acute hypoxic respiratory failure. EXAM: PORTABLE CHEST 1 VIEW COMPARISON:  08/01/2020 FINDINGS: Endotracheal tube is in place, tip approximately 3.9 centimeters above the carina and stable in appearance. Nasogastric tube tip is beyond the gastroesophageal junction. RIGHT-sided PICC line tip overlies the superior vena cava. Shallow lung inflation. Heart size is normal. Coarse reticular opacities are identified throughout the lungs bilaterally, with a basilar predominance, and stable since prior study. Stable emphysematous changes at the apices. IMPRESSION: Stable appearance of reticular opacities bilaterally. Emphysema. Electronically Signed   By: Norva Pavlov M.D.   On: 08/04/2020 08:16   DG CHEST PORT 1 VIEW  Result Date: 08/01/2020 CLINICAL DATA:  Respiratory failure. EXAM: PORTABLE CHEST 1 VIEW COMPARISON:  Chest radiograph and CTA 07/30/2020 FINDINGS: Endotracheal tube terminates at the inferior margin of the clavicular heads, well above the carina. Enteric tube courses into the abdomen with tip not imaged. A right PICC has been placed and terminates over the lower SVC. The patient remains  rotated to the left with unchanged cardiomediastinal silhouette. Widespread interstitial and hazy airspace opacities, greatest in the bases, are unchanged with underlying apical predominant emphysema again noted as well. No sizable pleural effusion or pneumothorax is identified. IMPRESSION: 1. Interval right PICC placement. 2. Unchanged bilateral infiltrates superimposed on chronic lung disease. Electronically Signed   By: Sebastian Ache M.D.   On: 08/01/2020 08:44   Portable Chest xray  Result Date: 07/30/2020 CLINICAL DATA:  Intubation EXAM: PORTABLE CHEST 1 VIEW COMPARISON:  Yesterday FINDINGS: Endotracheal tube with tip just below the clavicular heads. The enteric tube at least crosses the diaphragm. Artifact from EKG leads. Diffuse interstitial and airspace  opacity. Apical emphysematous type changes. Hyperinflation. No visible effusion or air leak. IMPRESSION: 1. Stable infiltrates superimposed on chronic lung disease. 2. Stable hardware positioning. Electronically Signed   By: Marnee Spring M.D.   On: 07/30/2020 05:26   DG Chest Portable 1 View  Result Date: 07/29/2020 CLINICAL DATA:  Orogastric tube placement. EXAM: PORTABLE CHEST 1 VIEW COMPARISON:  July 29, 2020 FINDINGS: There is stable endotracheal tube positioning. Since the prior study, there is been placement of a nasogastric tube. Its distal tip is seen within the body of the stomach. Stable marked severity emphysematous lung disease and pulmonary fibrosis is seen. There is improved aeration of the left lower lobe since the prior study with mild residual left basilar atelectasis and/or infiltrate. There is no evidence of a pleural effusion or pneumothorax. The heart size and mediastinal contours are within normal limits. The visualized skeletal structures are unremarkable. IMPRESSION: 1. Interval nasogastric tube placement and positioning, as described above. 2. Improved aeration within the left lung since the prior study without  additional significant interval changes. Electronically Signed   By: Aram Candela M.D.   On: 07/29/2020 20:58   Portable Chest x-ray  Result Date: 07/29/2020 CLINICAL DATA:  Shortness of breath EXAM: PORTABLE CHEST 1 VIEW COMPARISON:  07/08/2020, CT 08/17/2016, 12/06/2016 FINDINGS: Emphysematous disease and bilateral pulmonary fibrosis. Possible acute superimposed ground-glass infiltrates at the bases and right upper lobe, without significant change since radiograph yesterday. Stable cardiomediastinal silhouette. No pneumothorax. IMPRESSION: Overall no significant interval change in radiographic appearance of the chest since 07/15/2020. Underlying emphysematous disease and pulmonary fibrosis with possible acute superimposed ground-glass infiltrates at the bases and right upper lobe Electronically Signed   By: Jasmine Pang M.D.   On: 07/29/2020 18:10   DG Chest Port 1 View  Result Date: 07/16/2020 CLINICAL DATA:  Shortness of breath and cough EXAM: PORTABLE CHEST 1 VIEW COMPARISON:  December 06, 2016 FINDINGS: There is underlying scarring and fibrotic type change. There is ill-defined airspace opacity in each lower lung region as well as to a lesser degree in the upper lobe regions. The heart size and pulmonary vascularity are normal. No adenopathy. No bone lesions IMPRESSION: Suspect multifocal pneumonia superimposed on fibrosis. Atypical organism pneumonia may well be present in this circumstance. Correlation with COVID-19 status advised. Heart size normal.  No adenopathy appreciable. Electronically Signed   By: Bretta Bang III M.D.   On: 07/05/2020 09:01   DG Chest Port 1V same Day  Result Date: 07/29/2020 CLINICAL DATA:  Post intubation EXAM: PORTABLE CHEST 1 VIEW COMPARISON:  07/29/2020, 12/06/2016 FINDINGS: Interval intubation, tip of the endotracheal tube is about 3.1 cm superior to the carina. Decreased lung volumes. Emphysema and pulmonary fibrosis. Worsening consolidation at the  bilateral left greater than right lung bases. No pneumothorax. IMPRESSION: 1. Interval intubation, tip of the endotracheal tube about 3.1 cm superior to the carina. 2. Worsening airspace disease at the bilateral left greater than right lung bases. Electronically Signed   By: Jasmine Pang M.D.   On: 07/29/2020 18:58   ECHOCARDIOGRAM COMPLETE  Result Date: 07/29/2020    ECHOCARDIOGRAM REPORT   Patient Name:   Greene PEARL BERLINGER Date of Exam: 07/29/2020 Medical Rec #:  [id number]      Height:       69.0 in Accession #:    [id number]     Weight:       196.0 lb Date of Birth:  15-Oct-1961      BSA:  2.048 m Patient Age:    59 years       BP:           115/70 mmHg Patient Gender: M              HR:           110 bpm. [facility]am [facility] Procedure: 2D Echo, Cardiac Doppler and Color Doppler Indications:    Dyspnea  History:        Patient has prior history of Echocardiogram examinations, most                 recent 08/19/2016. COPD; Risk Factors:Diabetes and Obesity.                 Sepsis, pneumonia, pul.fibrosis, sinus tachycardia.  Sonographer:    Lavenia Atlas RDCS Referring Phys: [phone] [facility]  Sonographer Comments: Technically difficult study due to poor echo windows and patient is morbidly obese. Image acquisition challenging due to COPD, Image acquisition challenging due to respiratory motion and patient on Bipap and sitting up. IMPRESSIONS  1. Left ventricular ejection fraction, by estimation, is approximately 55%. The left ventricle has normal function. Left ventricular endocardial border not optimally defined to evaluate regional wall motion. There is mild left ventricular hypertrophy. Left ventricular diastolic parameters are indeterminate. There is the interventricular septum is flattened in systole and diastole, consistent with right ventricular pressure and volume overload.  2. RV-RA gradient is severely elevated at 68 mmHg consistent with pulmonary hypertension. Right ventricular  systolic function is moderately to severely reduced. The right ventricular size is mildly enlarged.  3. The mitral valve is grossly normal. No evidence of mitral valve regurgitation.  4. The aortic valve is tricuspid. Aortic valve regurgitation is not visualized.  5. Unable to estimate CVP. FINDINGS  Left Ventricle: Left ventricular ejection fraction, by estimation, is 55%. The left ventricle has normal function. Left ventricular endocardial border not optimally defined to evaluate regional wall motion. The left ventricular internal cavity size was normal in size. There is mild left ventricular hypertrophy. The interventricular septum is flattened in systole and diastole, consistent with right ventricular pressure and volume overload. Left ventricular diastolic parameters are indeterminate. Right Ventricle: RV-RA gradient is severely elevated at 68 mmHg consistent with pulmonary hypertension. The right ventricular size is mildly enlarged. No increase in right ventricular wall thickness. Right ventricular systolic function is severely reduced. Left Atrium: Left atrial size was normal in size. Right Atrium: Right atrial size was normal in size. Pericardium: The pericardium was not assessed. Mitral Valve: The mitral valve is grossly normal. There is mild calcification of the mitral valve leaflet(s). No evidence of mitral valve regurgitation. Tricuspid Valve: The tricuspid valve is grossly normal. Tricuspid valve regurgitation is mild. Aortic Valve: The aortic valve is tricuspid. Aortic valve regurgitation is not visualized. Pulmonic Valve: The pulmonic valve was not well visualized. Pulmonic valve regurgitation is mild. Aorta: The aortic root is normal in size and structure. Venous: Unable to estimate CVP. The inferior vena cava was not well visualized. IAS/Shunts: The interatrial septum was not well visualized.  LEFT VENTRICLE PLAX 2D LVIDd:         3.83 cm  Diastology LVIDs:         2.91 cm  LV e' medial:    9.68  cm/s LV PW:         1.23 cm  LV E/e' medial:  6.6 LV IVS:        1.12 cm  LV e' lateral:   10.30 cm/s LVOT diam:     2.30 cm  LV E/e' lateral: 6.2 LV SV:         41 LV SV Index:   20 LVOT Area:     4.15 cm  RIGHT VENTRICLE RV Basal diam:  3.91 cm RV S prime:     6.74 cm/s TAPSE (M-mode): 2.0 cm LEFT ATRIUM           Index       RIGHT ATRIUM           Index LA diam:      3.40 cm 1.66 cm/m  RA Area:     17.40 cm LA Vol (A4C): 29.4 ml 14.35 ml/m RA Volume:   49.10 ml  23.97 ml/m  AORTIC VALVE LVOT Vmax:   60.70 cm/s LVOT Vmean:  32.800 cm/s LVOT VTI:    0.099 m  AORTA Ao Root diam: 3.20 cm MITRAL VALVE               TRICUSPID VALVE MV Area (PHT): 6.65 cm    TR Peak grad:   68.9 mmHg MV Decel Time: 114 msec    TR Vmax:        415.00 cm/s MV E velocity: 64.30 cm/s MV A velocity: 39.00 cm/s  SHUNTS MV E/A ratio:  1.65        Systemic VTI:  0.10 m                            Systemic Diam: 2.30 cm Nona Dell MD Electronically signed by Nona Dell MD Signature Date/Time: 07/29/2020/11:17:11 AM    Final    [place] EKG SITE RITE  Result Date: 07/30/2020 If Site Rite image not attached, placement could not be confirmed due to current cardiac rhythm.   Microbiology Recent Results (from the past 240 hour(s))  Culture, respiratory (non-expectorated)     Status: None   Collection Time: 07/30/20  5:08 PM   Specimen: Tracheal Aspirate; Respiratory  Result Value Ref Range Status   Specimen Description TRACHEAL ASPIRATE  Final   Special Requests NONE  Final   Gram Stain   Final    RARE WBC PRESENT,BOTH PMN AND MONONUCLEAR NO ORGANISMS SEEN Performed at [facility], [address], [place], [place] [address]    Culture FEW CANDIDA TROPICALIS  Final   Report Status 08/02/2020 FINAL  Final  Culture, respiratory (non-expectorated)     Status: None (Preliminary result)   Collection Time: 08/07/20  9:02 AM   Specimen: Tracheal Aspirate; Respiratory  Result Value Ref Range Status   Specimen  Description TRACHEAL ASPIRATE  Final   Special Requests NONE  Final   Gram Stain   Final    RARE WBC PRESENT,BOTH PMN AND MONONUCLEAR FEW GRAM VARIABLE ROD RARE GRAM POSITIVE COCCI IN PAIRS RARE BUDDING YEAST SEEN    Culture   Final    MODERATE GRAM NEGATIVE RODS IDENTIFICATION AND SUSCEPTIBILITIES TO FOLLOW Performed at [facility], [address], [place], [place] [address]    Report Status PENDING  Incomplete    Lab Basic Metabolic Panel: Recent Labs  Lab 08/03/20 0333 08/03/20 0434 08/04/20 0312 08/04/20 0332 08/05/20 0400 08/05/20 0400 08/06/20 0341 08/06/20 1239 08/07/20 0535 08/07/20 1816 08/08/20 0306  NA 149*   < > 143   < > 145   < > 141 136 140 138 142  K 4.4   < >  4.5   < > 4.3   < > 3.8 4.4 4.5 4.3 3.8  CL 105   < > 100   < > 101  --  97*  --  95* 94* 93*  CO2 34*   < > 34*   < > 36*  --  35*  --  35* 36* 40*  GLUCOSE 157*   < > 152*   < > 164*  --  117*  --  154* 225* 174*  BUN 52*   < > 54*   < > 48*  --  43*  --  57* 54* 53*  CREATININE 0.94   < > 0.80   < > 0.77  --  0.71  --  0.90 0.86 0.72  CALCIUM 9.1   < > 9.2   < > 9.2  --  8.8*  --  9.0 8.0* 8.9  MG 2.6*  --  2.6*  --   --   --   --   --   --   --   --   PHOS  --   --  4.6  --   --   --   --   --   --   --   --    < > = values in this interval not displayed.   Liver Function Tests: No results for input(s): AST, ALT, ALKPHOS, BILITOT, PROT, ALBUMIN in the last 168 hours. No results for input(s): LIPASE, AMYLASE in the last 168 hours. No results for input(s): AMMONIA in the last 168 hours. CBC: Recent Labs  Lab 08/04/20 0312 08/04/20 0332 08/05/20 0400 08/06/20 0341 08/06/20 1239 08/07/20 0535 08/08/20 0306  WBC 16.3*  --  13.0* 14.9*  --  18.3* 14.8*  HGB 13.4   < > 12.6* 13.0 14.3 12.9* 11.9*  HCT 43.1   < > 40.8 41.8 42.0 41.5 38.7*  MCV 92.9  --  93.6 93.5  --  92.8 91.1  PLT 220  --  179 153  --  175 169   < > = values in this interval not displayed.   Cardiac  Enzymes: No results for input(s): CKTOTAL, CKMB, CKMBINDEX, TROPONINI in the last 168 hours. Sepsis Labs: Recent Labs  Lab 08/05/20 0400 08/06/20 0341 08/07/20 0535 08/07/20 1148 08/08/20 0306  PROCALCITON  --   --   --  3.45  --   WBC 13.0* 14.9* 18.3*  --  14.8*    Procedures/Operations     Anaelle Dunton 12-15-2019, 4:09 PM

## 2020-08-30 NOTE — Progress Notes (Signed)
20 mL of Dilaudid of 100 mg/50 mL drip wasted in sink with Julius Bowels, RN

## 2020-08-30 NOTE — Procedures (Signed)
Extubation Procedure Note  Patient Details:   Name: Bradley [name] DOB: [birth year] MRN: [id number]   Airway Documentation:    Vent end date: 08/04/2020 Vent end time: 0922   Evaluation  Pt extubated to RA per Withdraw of Life Protocol  Carolan Shiver 08/02/2020, 10:27 AM

## 2020-08-30 NOTE — Progress Notes (Addendum)
                                                                                                                                                                                                           Daily Progress Note   Patient Name: Bradley [name]       Date: Aug 31, 2020 DOB: 1961-10-09  Age: 59 y.o. MRN#: [id number] Attending Physician: Cheri Fowler, MD Primary Care Physician: Georgann Housekeeper, MD Admit Date: 07/26/2020  Reason for Consultation/Follow-up: Terminal care  Subjective: Spoke with family(Bradley [name] and approx 5 others) RN, Respiratory and chaplain at bedside.  Family is prepared for compassionate wean of both parents.   Assessment: Wean went smoothly.  Anticipate death in minutes to hours for both patients.   Patient Profile/HPI:  59 y.o. male  with past medical history of pulmonary fibrosis, COPD, DM II, anxiety who was admitted on 07/12/2020 with multifocal pneumonia. He required intubation shortly after admission.  He has intermittently required nitrous oxide due to pulmonary HTN, and PRN paralytics for vent dyssynchrony.  Unfortunately after 11 days of intense hospital support Bradley [name] has not improved and is continues to require more support.  His family has agreed with comfort measures.    Length of Stay: 12   Vital Signs: BP 124/78   Pulse 64   Temp 99.8 F (37.7 C) (Oral)   Resp (!) 24   Ht 5\' 9"  (1.753 m)   Wt 85.4 kg   SpO2 99%   BMI 27.80 kg/m  SpO2: SpO2: 99 % O2 Device: O2 Device: Ventilator O2 Flow Rate: O2 Flow Rate (L/min): 15 L/min       Palliative Assessment/Data: 10%     Palliative Care Plan    Recommendations/Plan:  Comfort measures only.  Adjusted dilaudid gtt orders for comfort during wean.  Code Status:  DNR  Prognosis:   minutes to hours.  Discharge Planning:  Anticipated Hospital Death  Care plan was discussed with family and RN  Thank you for allowing the Palliative Medicine Team to assist in the care of this  patient.  Total time spent:  35 min.     Greater than 50%  of this time was spent counseling and coordinating care related to the above assessment and plan.  , PA-C Palliative Medicine  Please contact Palliative MedicineTeam phone at [phone] for questions and concerns between 7 am - 7 pm.   Please see AMION for individual provider pager numbers.

## 2020-08-30 NOTE — Progress Notes (Signed)
This chaplain responded to PMT consult for spiritual care around the Pt. compassionate wean.  Unique in the Pt. story is the bedside presence of the Pt. spouse who is also experiencing a compassionate wean.  The chaplain is appreciative of the medical team's support.   Family is present and celebrating the story of the 20+ years of companionship the couple shares. The chaplain shared prayer and a compassionate presence with the family.  The RN-Mariah assisted the chaplain in completing hand prints for the family at the Pt. time of death.

## 2020-08-30 DEATH — deceased

## 2023-06-25 ENCOUNTER — Encounter: Payer: BLUE CROSS/BLUE SHIELD | Attending: Internal Medicine
